# Patient Record
Sex: Female | Born: 1945 | Race: White | Hispanic: No | Marital: Married | Smoking: Former smoker
Health system: Southern US, Community
[De-identification: ages and names within clinical notes are randomized; demographics above are authoritative.]

## PROBLEM LIST (undated history)

## (undated) DIAGNOSIS — C50412 Malignant neoplasm of upper-outer quadrant of left female breast: Secondary | ICD-10-CM

## (undated) DIAGNOSIS — M349 Systemic sclerosis, unspecified: Secondary | ICD-10-CM

## (undated) DIAGNOSIS — Z87891 Personal history of nicotine dependence: Principal | ICD-10-CM

## (undated) DIAGNOSIS — I219 Acute myocardial infarction, unspecified: Secondary | ICD-10-CM

## (undated) DIAGNOSIS — M8580 Other specified disorders of bone density and structure, unspecified site: Secondary | ICD-10-CM

## (undated) DIAGNOSIS — I319 Disease of pericardium, unspecified: Secondary | ICD-10-CM

## (undated) DIAGNOSIS — N390 Urinary tract infection, site not specified: Secondary | ICD-10-CM

## (undated) DIAGNOSIS — F419 Anxiety disorder, unspecified: Secondary | ICD-10-CM

## (undated) DIAGNOSIS — Z8489 Family history of other specified conditions: Secondary | ICD-10-CM

## (undated) DIAGNOSIS — R05 Cough: Secondary | ICD-10-CM

## (undated) DIAGNOSIS — J449 Chronic obstructive pulmonary disease, unspecified: Secondary | ICD-10-CM

## (undated) DIAGNOSIS — Z972 Presence of dental prosthetic device (complete) (partial): Secondary | ICD-10-CM

## (undated) DIAGNOSIS — R0600 Dyspnea, unspecified: Secondary | ICD-10-CM

## (undated) DIAGNOSIS — I10 Essential (primary) hypertension: Secondary | ICD-10-CM

## (undated) DIAGNOSIS — D649 Anemia, unspecified: Secondary | ICD-10-CM

## (undated) DIAGNOSIS — K219 Gastro-esophageal reflux disease without esophagitis: Secondary | ICD-10-CM

## (undated) DIAGNOSIS — R059 Cough, unspecified: Secondary | ICD-10-CM

## (undated) DIAGNOSIS — N189 Chronic kidney disease, unspecified: Secondary | ICD-10-CM

## (undated) DIAGNOSIS — C801 Malignant (primary) neoplasm, unspecified: Secondary | ICD-10-CM

## (undated) HISTORY — PX: CATARACT EXTRACTION W/ INTRAOCULAR LENS IMPLANT: SHX1309

## (undated) HISTORY — DX: Anxiety disorder, unspecified: F41.9

## (undated) HISTORY — PX: CORONARY ANGIOPLASTY: SHX604

## (undated) HISTORY — DX: Anemia, unspecified: D64.9

## (undated) HISTORY — DX: Malignant neoplasm of upper-outer quadrant of left female breast: C50.412

## (undated) HISTORY — PX: VESICOVAGINAL FISTULA CLOSURE W/ TAH: SUR271

## (undated) HISTORY — PX: TUBAL LIGATION: SHX77

## (undated) HISTORY — PX: CARDIAC CATHETERIZATION: SHX172

## (undated) HISTORY — DX: Personal history of nicotine dependence: Z87.891

## (undated) HISTORY — DX: Disease of pericardium, unspecified: I31.9

## (undated) HISTORY — DX: Malignant (primary) neoplasm, unspecified: C80.1

## (undated) HISTORY — DX: Urinary tract infection, site not specified: N39.0

## (undated) HISTORY — PX: EYE SURGERY: SHX253

## (undated) HISTORY — DX: Other specified disorders of bone density and structure, unspecified site: M85.80

---

## 2001-04-29 ENCOUNTER — Other Ambulatory Visit: Admission: RE | Admit: 2001-04-29 | Discharge: 2001-04-29 | Payer: Self-pay | Admitting: Family Medicine

## 2005-11-19 ENCOUNTER — Ambulatory Visit: Payer: Self-pay | Admitting: Gastroenterology

## 2005-12-02 ENCOUNTER — Ambulatory Visit: Payer: Self-pay | Admitting: Gastroenterology

## 2005-12-02 LAB — HM COLONOSCOPY

## 2005-12-16 ENCOUNTER — Ambulatory Visit: Payer: Self-pay

## 2005-12-30 ENCOUNTER — Ambulatory Visit: Payer: Self-pay

## 2006-12-28 ENCOUNTER — Ambulatory Visit: Payer: Self-pay

## 2008-01-25 ENCOUNTER — Ambulatory Visit: Payer: Self-pay | Admitting: Family Medicine

## 2008-02-08 ENCOUNTER — Ambulatory Visit: Payer: Self-pay | Admitting: Family Medicine

## 2008-08-20 ENCOUNTER — Encounter: Payer: Self-pay | Admitting: Internal Medicine

## 2008-09-12 ENCOUNTER — Encounter: Payer: Self-pay | Admitting: Internal Medicine

## 2008-09-27 ENCOUNTER — Other Ambulatory Visit: Payer: Self-pay

## 2008-09-27 ENCOUNTER — Encounter: Payer: Self-pay | Admitting: Internal Medicine

## 2008-09-28 ENCOUNTER — Encounter: Payer: Self-pay | Admitting: Cardiovascular Disease

## 2008-09-28 ENCOUNTER — Encounter: Payer: Self-pay | Admitting: Internal Medicine

## 2008-09-28 ENCOUNTER — Telehealth: Payer: Self-pay | Admitting: Gastroenterology

## 2008-09-28 ENCOUNTER — Ambulatory Visit: Payer: Self-pay | Admitting: Family Medicine

## 2008-10-01 ENCOUNTER — Encounter: Payer: Self-pay | Admitting: Internal Medicine

## 2008-10-02 ENCOUNTER — Telehealth: Payer: Self-pay | Admitting: Gastroenterology

## 2008-10-03 ENCOUNTER — Ambulatory Visit: Payer: Self-pay | Admitting: Gastroenterology

## 2008-10-03 ENCOUNTER — Encounter (INDEPENDENT_AMBULATORY_CARE_PROVIDER_SITE_OTHER): Payer: Self-pay | Admitting: *Deleted

## 2008-10-03 DIAGNOSIS — D649 Anemia, unspecified: Secondary | ICD-10-CM | POA: Insufficient documentation

## 2008-10-04 ENCOUNTER — Encounter: Payer: Self-pay | Admitting: Internal Medicine

## 2008-10-05 ENCOUNTER — Encounter: Payer: Self-pay | Admitting: Internal Medicine

## 2008-10-09 ENCOUNTER — Encounter: Payer: Self-pay | Admitting: Internal Medicine

## 2008-10-10 ENCOUNTER — Telehealth: Payer: Self-pay | Admitting: Gastroenterology

## 2008-10-11 ENCOUNTER — Encounter: Payer: Self-pay | Admitting: Internal Medicine

## 2008-10-15 ENCOUNTER — Encounter: Payer: Self-pay | Admitting: Internal Medicine

## 2008-10-16 ENCOUNTER — Ambulatory Visit: Payer: Self-pay | Admitting: Gastroenterology

## 2008-10-16 ENCOUNTER — Telehealth: Payer: Self-pay | Admitting: Gastroenterology

## 2008-10-16 LAB — CONVERTED CEMR LAB
Fecal Occult Blood: NEGATIVE
OCCULT 3: NEGATIVE
OCCULT 4: NEGATIVE
OCCULT 5: NEGATIVE

## 2008-10-17 LAB — CONVERTED CEMR LAB
OCCULT 1: NEGATIVE
OCCULT 2: NEGATIVE

## 2008-10-18 ENCOUNTER — Encounter: Payer: Self-pay | Admitting: Internal Medicine

## 2008-10-24 ENCOUNTER — Encounter: Payer: Self-pay | Admitting: Cardiovascular Disease

## 2008-10-24 ENCOUNTER — Encounter: Payer: Self-pay | Admitting: Internal Medicine

## 2008-10-24 LAB — CONVERTED CEMR LAB
ALT: 31 units/L
AST: 25 units/L
Creatinine, Ser: 0.72 mg/dL
Potassium: 4.6 meq/L
Sodium: 135 meq/L

## 2008-11-14 ENCOUNTER — Telehealth (INDEPENDENT_AMBULATORY_CARE_PROVIDER_SITE_OTHER): Payer: Self-pay | Admitting: *Deleted

## 2008-11-14 ENCOUNTER — Ambulatory Visit: Payer: Self-pay | Admitting: Family Medicine

## 2008-11-14 ENCOUNTER — Encounter: Payer: Self-pay | Admitting: Internal Medicine

## 2008-11-14 ENCOUNTER — Encounter: Payer: Self-pay | Admitting: Cardiovascular Disease

## 2008-11-19 ENCOUNTER — Encounter: Payer: Self-pay | Admitting: Internal Medicine

## 2008-11-21 ENCOUNTER — Ambulatory Visit: Payer: Self-pay | Admitting: Internal Medicine

## 2008-11-21 DIAGNOSIS — I300 Acute nonspecific idiopathic pericarditis: Secondary | ICD-10-CM | POA: Insufficient documentation

## 2008-12-18 ENCOUNTER — Encounter: Payer: Self-pay | Admitting: Internal Medicine

## 2008-12-26 ENCOUNTER — Ambulatory Visit: Payer: Self-pay | Admitting: Internal Medicine

## 2009-01-11 ENCOUNTER — Ambulatory Visit: Payer: Self-pay | Admitting: Internal Medicine

## 2009-01-11 ENCOUNTER — Ambulatory Visit: Payer: Self-pay

## 2009-07-23 ENCOUNTER — Ambulatory Visit: Payer: Self-pay | Admitting: Nephrology

## 2010-04-27 HISTORY — PX: BREAST BIOPSY: SHX20

## 2010-04-30 ENCOUNTER — Ambulatory Visit: Payer: Self-pay | Admitting: Family Medicine

## 2010-05-05 ENCOUNTER — Ambulatory Visit: Payer: Self-pay | Admitting: Family Medicine

## 2010-05-25 LAB — CONVERTED CEMR LAB
AST: 28 units/L (ref 0–37)
Albumin: 4.3 g/dL (ref 3.5–5.2)
Alkaline Phosphatase: 100 units/L (ref 39–117)
BUN: 12 mg/dL
BUN: 12 mg/dL (ref 6–23)
Basophils Absolute: 0.1 10*3/uL (ref 0.0–0.1)
Eosinophils Relative: 4 % (ref 0–5)
HCT: 34.8 % — ABNORMAL LOW (ref 36.0–46.0)
Lymphocytes Relative: 21 % (ref 12–46)
Neutro Abs: 3.6 10*3/uL (ref 1.7–7.7)
Platelets: 315 10*3/uL (ref 150–400)
Potassium: 4.8 meq/L (ref 3.5–5.3)
RDW: 15.7 % — ABNORMAL HIGH (ref 11.5–15.5)

## 2010-06-02 ENCOUNTER — Ambulatory Visit: Payer: Self-pay | Admitting: General Surgery

## 2010-06-04 LAB — PATHOLOGY REPORT

## 2010-11-07 ENCOUNTER — Encounter: Payer: Self-pay | Admitting: Internal Medicine

## 2010-12-04 ENCOUNTER — Ambulatory Visit: Payer: Self-pay | Admitting: General Surgery

## 2011-06-02 ENCOUNTER — Ambulatory Visit: Payer: Self-pay | Admitting: Family Medicine

## 2012-06-14 ENCOUNTER — Ambulatory Visit: Payer: Self-pay | Admitting: Family Medicine

## 2012-06-20 ENCOUNTER — Ambulatory Visit: Payer: Self-pay | Admitting: Family Medicine

## 2012-07-08 ENCOUNTER — Ambulatory Visit: Payer: Self-pay | Admitting: Family Medicine

## 2012-07-25 ENCOUNTER — Ambulatory Visit: Payer: Self-pay | Admitting: Family Medicine

## 2012-07-28 ENCOUNTER — Ambulatory Visit: Payer: Self-pay | Admitting: Internal Medicine

## 2012-07-28 ENCOUNTER — Ambulatory Visit: Payer: Self-pay | Admitting: Oncology

## 2012-07-29 LAB — CBC CANCER CENTER
Basophil %: 1.3 %
HCT: 32.6 % — ABNORMAL LOW (ref 35.0–47.0)
Lymphocyte #: 1 x10 3/mm (ref 1.0–3.6)
Lymphocyte %: 14.3 %
MCHC: 34.6 g/dL (ref 32.0–36.0)
Monocyte #: 1.1 x10 3/mm — ABNORMAL HIGH (ref 0.2–0.9)
Neutrophil %: 65.2 %
RBC: 3.35 10*6/uL — ABNORMAL LOW (ref 3.80–5.20)
RDW: 13.1 % (ref 11.5–14.5)
WBC: 6.7 x10 3/mm (ref 3.6–11.0)

## 2012-07-29 LAB — COMPREHENSIVE METABOLIC PANEL
Albumin: 3.4 g/dL (ref 3.4–5.0)
Anion Gap: 7 (ref 7–16)
Chloride: 86 mmol/L — ABNORMAL LOW (ref 98–107)
Co2: 28 mmol/L (ref 21–32)
Creatinine: 0.89 mg/dL (ref 0.60–1.30)
Osmolality: 242 (ref 275–301)
Potassium: 4.7 mmol/L (ref 3.5–5.1)
SGPT (ALT): 21 U/L (ref 12–78)
Total Protein: 7.9 g/dL (ref 6.4–8.2)

## 2012-07-29 LAB — PROTIME-INR: INR: 0.9

## 2012-07-29 LAB — APTT: Activated PTT: 30 secs (ref 23.6–35.9)

## 2012-08-04 ENCOUNTER — Ambulatory Visit: Payer: Self-pay | Admitting: Oncology

## 2012-08-10 ENCOUNTER — Ambulatory Visit: Payer: Self-pay | Admitting: Oncology

## 2012-08-16 LAB — CEA: CEA: 5.9 ng/mL — ABNORMAL HIGH (ref 0.0–4.7)

## 2012-08-25 ENCOUNTER — Ambulatory Visit: Payer: Self-pay | Admitting: Oncology

## 2012-08-25 ENCOUNTER — Ambulatory Visit: Payer: Self-pay | Admitting: Internal Medicine

## 2012-09-25 ENCOUNTER — Ambulatory Visit: Payer: Self-pay | Admitting: Oncology

## 2012-09-25 ENCOUNTER — Ambulatory Visit: Payer: Self-pay | Admitting: Internal Medicine

## 2012-10-25 ENCOUNTER — Ambulatory Visit: Payer: Self-pay | Admitting: Oncology

## 2012-10-25 ENCOUNTER — Ambulatory Visit: Payer: Self-pay | Admitting: Internal Medicine

## 2012-12-20 ENCOUNTER — Ambulatory Visit: Payer: Self-pay | Admitting: Family Medicine

## 2013-01-03 ENCOUNTER — Ambulatory Visit: Payer: Self-pay | Admitting: Oncology

## 2013-01-05 ENCOUNTER — Ambulatory Visit: Payer: Self-pay | Admitting: Oncology

## 2013-01-25 ENCOUNTER — Ambulatory Visit: Payer: Self-pay | Admitting: Oncology

## 2013-05-04 ENCOUNTER — Ambulatory Visit: Payer: Self-pay | Admitting: Family Medicine

## 2013-07-04 ENCOUNTER — Ambulatory Visit: Payer: Self-pay | Admitting: Oncology

## 2013-07-10 ENCOUNTER — Ambulatory Visit: Payer: Self-pay | Admitting: Oncology

## 2013-07-11 LAB — CBC CANCER CENTER
Basophil #: 0.1 x10 3/mm (ref 0.0–0.1)
Basophil %: 1.1 %
EOS ABS: 0.1 x10 3/mm (ref 0.0–0.7)
Eosinophil %: 2.7 %
HCT: 33.5 % — ABNORMAL LOW (ref 35.0–47.0)
HGB: 11.3 g/dL — AB (ref 12.0–16.0)
Lymphocyte #: 0.9 x10 3/mm — ABNORMAL LOW (ref 1.0–3.6)
Lymphocyte %: 18.7 %
MCH: 34.2 pg — ABNORMAL HIGH (ref 26.0–34.0)
MCHC: 33.7 g/dL (ref 32.0–36.0)
MCV: 102 fL — AB (ref 80–100)
Monocyte #: 0.8 x10 3/mm (ref 0.2–0.9)
Monocyte %: 16.3 %
NEUTROS ABS: 2.9 x10 3/mm (ref 1.4–6.5)
Neutrophil %: 61.2 %
Platelet: 282 x10 3/mm (ref 150–440)
RBC: 3.29 10*6/uL — ABNORMAL LOW (ref 3.80–5.20)
RDW: 12.9 % (ref 11.5–14.5)
WBC: 4.8 x10 3/mm (ref 3.6–11.0)

## 2013-07-11 LAB — COMPREHENSIVE METABOLIC PANEL
ALK PHOS: 72 U/L
Albumin: 3.8 g/dL (ref 3.4–5.0)
Anion Gap: 10 (ref 7–16)
BUN: 15 mg/dL (ref 7–18)
Bilirubin,Total: 0.3 mg/dL (ref 0.2–1.0)
CHLORIDE: 90 mmol/L — AB (ref 98–107)
Calcium, Total: 9.2 mg/dL (ref 8.5–10.1)
Co2: 25 mmol/L (ref 21–32)
Creatinine: 1.06 mg/dL (ref 0.60–1.30)
EGFR (African American): >60
GFR CALC NON AF AMER: 54 — AB
Glucose: 115 mg/dL — ABNORMAL HIGH (ref 65–99)
Osmolality: 253 (ref 275–301)
Potassium: 4.5 mmol/L (ref 3.5–5.1)
SGOT(AST): 23 U/L (ref 15–37)
SGPT (ALT): 22 U/L (ref 12–78)
SODIUM: 125 mmol/L — AB (ref 136–145)
Total Protein: 8.2 g/dL (ref 6.4–8.2)

## 2013-07-13 LAB — CEA: CEA: 5.7 ng/mL — AB (ref 0.0–4.7)

## 2013-07-26 ENCOUNTER — Ambulatory Visit: Payer: Self-pay | Admitting: Oncology

## 2014-01-16 LAB — LIPID PANEL
CHOLESTEROL: 227 mg/dL — AB (ref 0–200)
HDL: 83 mg/dL — AB (ref 35–70)
LDL Cholesterol: 128 mg/dL
LDl/HDL Ratio: 1.5
Triglycerides: 81 mg/dL (ref 40–160)

## 2014-01-16 LAB — CBC AND DIFFERENTIAL
HEMATOCRIT: 34 % — AB (ref 36–46)
HEMOGLOBIN: 11.5 g/dL — AB (ref 12.0–16.0)
NEUTROS ABS: 3 /uL
Platelets: 296 10*3/uL (ref 150–399)
WBC: 5.8 10^3/mL

## 2014-01-16 LAB — HEPATIC FUNCTION PANEL
ALK PHOS: 64 U/L (ref 25–125)
ALT: 17 U/L (ref 7–35)
AST: 25 U/L (ref 13–35)
Bilirubin, Total: 0.3 mg/dL

## 2014-01-16 LAB — BASIC METABOLIC PANEL
BUN: 11 mg/dL (ref 4–21)
Creatinine: 1 mg/dL (ref 0.5–1.1)
GLUCOSE: 90 mg/dL
Potassium: 5 mmol/L (ref 3.4–5.3)
Sodium: 126 mmol/L — AB (ref 137–147)

## 2014-06-18 ENCOUNTER — Ambulatory Visit: Payer: Self-pay | Admitting: Family Medicine

## 2014-09-26 ENCOUNTER — Other Ambulatory Visit: Payer: Self-pay | Admitting: Family Medicine

## 2014-09-26 DIAGNOSIS — Z13820 Encounter for screening for osteoporosis: Secondary | ICD-10-CM

## 2014-09-28 ENCOUNTER — Telehealth: Payer: Self-pay

## 2014-09-28 NOTE — Telephone Encounter (Signed)
Pap results were normal. [id number]

## 2014-10-01 NOTE — Telephone Encounter (Signed)
10/01/14 Patient advised  ED

## 2014-10-04 ENCOUNTER — Ambulatory Visit: Payer: Self-pay | Attending: Family Medicine

## 2014-12-25 ENCOUNTER — Other Ambulatory Visit: Payer: Self-pay

## 2014-12-25 MED ORDER — BUSPIRONE HCL 5 MG PO TABS: 5.0000 mg | ORAL_TABLET | Freq: Two times a day (BID) | ORAL | Status: DC

## 2015-04-28 HISTORY — PX: MASTECTOMY: SHX3

## 2015-05-23 DIAGNOSIS — N6019 Diffuse cystic mastopathy of unspecified breast: Secondary | ICD-10-CM | POA: Insufficient documentation

## 2015-05-23 DIAGNOSIS — M810 Age-related osteoporosis without current pathological fracture: Secondary | ICD-10-CM

## 2015-05-23 DIAGNOSIS — E7800 Pure hypercholesterolemia, unspecified: Secondary | ICD-10-CM | POA: Insufficient documentation

## 2015-05-23 DIAGNOSIS — M8580 Other specified disorders of bone density and structure, unspecified site: Secondary | ICD-10-CM | POA: Insufficient documentation

## 2015-05-23 DIAGNOSIS — I10 Essential (primary) hypertension: Secondary | ICD-10-CM | POA: Insufficient documentation

## 2015-05-23 DIAGNOSIS — E871 Hypo-osmolality and hyponatremia: Secondary | ICD-10-CM | POA: Insufficient documentation

## 2015-05-23 DIAGNOSIS — J449 Chronic obstructive pulmonary disease, unspecified: Secondary | ICD-10-CM | POA: Insufficient documentation

## 2015-05-23 DIAGNOSIS — J302 Other seasonal allergic rhinitis: Secondary | ICD-10-CM | POA: Insufficient documentation

## 2015-05-23 DIAGNOSIS — K219 Gastro-esophageal reflux disease without esophagitis: Secondary | ICD-10-CM | POA: Insufficient documentation

## 2015-05-23 DIAGNOSIS — F419 Anxiety disorder, unspecified: Secondary | ICD-10-CM | POA: Insufficient documentation

## 2015-06-06 ENCOUNTER — Ambulatory Visit (INDEPENDENT_AMBULATORY_CARE_PROVIDER_SITE_OTHER): Payer: PPO | Admitting: Physician Assistant

## 2015-06-06 VITALS — BP 150/78 | HR 72 | Temp 98.1°F | Resp 16 | Wt 146.0 lb

## 2015-06-06 DIAGNOSIS — R52 Pain, unspecified: Secondary | ICD-10-CM

## 2015-06-06 DIAGNOSIS — J0140 Acute pansinusitis, unspecified: Secondary | ICD-10-CM

## 2015-06-06 DIAGNOSIS — H66001 Acute suppurative otitis media without spontaneous rupture of ear drum, right ear: Secondary | ICD-10-CM | POA: Diagnosis not present

## 2015-06-06 DIAGNOSIS — J449 Chronic obstructive pulmonary disease, unspecified: Secondary | ICD-10-CM | POA: Diagnosis not present

## 2015-06-06 DIAGNOSIS — J069 Acute upper respiratory infection, unspecified: Secondary | ICD-10-CM

## 2015-06-06 DIAGNOSIS — R05 Cough: Secondary | ICD-10-CM

## 2015-06-06 DIAGNOSIS — R059 Cough, unspecified: Secondary | ICD-10-CM

## 2015-06-06 LAB — POCT INFLUENZA A/B
Influenza A, POC: NEGATIVE
Influenza B, POC: NEGATIVE

## 2015-06-06 MED ORDER — ALBUTEROL SULFATE (2.5 MG/3ML) 0.083% IN NEBU: 2.5000 mg | INHALATION_SOLUTION | Freq: Four times a day (QID) | RESPIRATORY_TRACT | Status: AC | PRN

## 2015-06-06 MED ORDER — AMOXICILLIN-POT CLAVULANATE 875-125 MG PO TABS: 1.0000 | ORAL_TABLET | Freq: Two times a day (BID) | ORAL | Status: DC

## 2015-06-06 NOTE — Patient Instructions (Signed)
Otitis Media With Effusion Otitis media with effusion is the presence of fluid in the middle ear. This is a common problem in children, which often follows ear infections. It may be present for weeks or longer after the infection. Unlike an acute ear infection, otitis media with effusion refers only to fluid behind the ear drum and not infection. Children with repeated ear and sinus infections and allergy problems are the most likely to get otitis media with effusion. CAUSES  The most frequent cause of the fluid buildup is dysfunction of the eustachian tubes. These are the tubes that drain fluid in the ears to the back of the nose (nasopharynx). SYMPTOMS   The main symptom of this condition is hearing loss. As a result, you or your child may:  Listen to the TV at a loud volume.  Not respond to questions.  Ask "what" often when spoken to.  Mistake or confuse one sound or word for another.  There may be a sensation of fullness or pressure but usually not pain. DIAGNOSIS   Your health care provider will diagnose this condition by examining you or your child's ears.  Your health care provider may test the pressure in you or your child's ear with a tympanometer.  A hearing test may be conducted if the problem persists. TREATMENT   Treatment depends on the duration and the effects of the effusion.  Antibiotics, decongestants, nose drops, and cortisone-type drugs (tablets or nasal spray) may not be helpful.  Children with persistent ear effusions may have delayed language or behavioral problems. Children at risk for developmental delays in hearing, learning, and speech may require referral to a specialist earlier than children not at risk.  You or your child's health care provider may suggest a referral to an ear, nose, and throat surgeon for treatment. The following may help restore normal hearing:  Drainage of fluid.  Placement of ear tubes (tympanostomy tubes).  Removal of adenoids  (adenoidectomy). HOME CARE INSTRUCTIONS   Avoid secondhand smoke.  Infants who are breastfed are less likely to have this condition.  Avoid feeding infants while they are lying flat.  Avoid known environmental allergens.  Avoid people who are sick. SEEK MEDICAL CARE IF:   Hearing is not better in 3 months.  Hearing is worse.  Ear pain.  Drainage from the ear.  Dizziness. MAKE SURE YOU:   Understand these instructions.  Will watch your condition.  Will get help right away if you are not doing well or get worse.   This information is not intended to replace advice given to you by your health care provider. Make sure you discuss any questions you have with your health care provider.   Document Released: 05/21/2004 Document Revised: 05/04/2014 Document Reviewed: 11/08/2012 Elsevier Interactive Patient Education 2016 Else[employer] Sinusitis, Adult Sinusitis is redness, soreness, and inflammation of the paranasal sinuses. Paranasal sinuses are air pockets within the bones of your face. They are located beneath your eyes, in the middle of your forehead, and above your eyes. In healthy paranasal sinuses, mucus is able to drain out, and air is able to circulate through them by way of your nose. However, when your paranasal sinuses are inflamed, mucus and air can become trapped. This can allow bacteria and other germs to grow and cause infection. Sinusitis can develop quickly and last only a short time (acute) or continue over a long period (chronic). Sinusitis that lasts for more than 12 weeks is considered chronic. CAUSES Causes of sinusitis  include:  Allergies.  Structural abnormalities, such as displacement of the cartilage that separates your nostrils (deviated septum), which can decrease the air flow through your nose and sinuses and affect sinus drainage.  Functional abnormalities, such as when the small hairs (cilia) that line your sinuses and help remove mucus do not work  properly or are not present. SIGNS AND SYMPTOMS Symptoms of acute and chronic sinusitis are the same. The primary symptoms are pain and pressure around the affected sinuses. Other symptoms include:  Upper toothache.  Earache.  Headache.  Bad breath.  Decreased sense of smell and taste.  A cough, which worsens when you are lying flat.  Fatigue.  Fever.  Thick drainage from your nose, which often is green and may contain pus (purulent).  Swelling and warmth over the affected sinuses. DIAGNOSIS Your health care provider will perform a physical exam. During your exam, your health care provider may perform any of the following to help determine if you have acute sinusitis or chronic sinusitis:  Look in your nose for signs of abnormal growths in your nostrils (nasal polyps).  Tap over the affected sinus to check for signs of infection.  View the inside of your sinuses using an imaging device that has a light attached (endoscope). If your health care provider suspects that you have chronic sinusitis, one or more of the following tests may be recommended:  Allergy tests.  Nasal culture. A sample of mucus is taken from your nose, sent to a lab, and screened for bacteria.  Nasal cytology. A sample of mucus is taken from your nose and examined by your health care provider to determine if your sinusitis is related to an allergy. TREATMENT Most cases of acute sinusitis are related to a viral infection and will resolve on their own within 10 days. Sometimes, medicines are prescribed to help relieve symptoms of both acute and chronic sinusitis. These may include pain medicines, decongestants, nasal steroid sprays, or saline sprays. However, for sinusitis related to a bacterial infection, your health care provider will prescribe antibiotic medicines. These are medicines that will help kill the bacteria causing the infection. Rarely, sinusitis is caused by a fungal infection. In these cases,  your health care provider will prescribe antifungal medicine. For some cases of chronic sinusitis, surgery is needed. Generally, these are cases in which sinusitis recurs more than 3 times per year, despite other treatments. HOME CARE INSTRUCTIONS  Drink plenty of water. Water helps thin the mucus so your sinuses can drain more easily.  Use a humidifier.  Inhale steam 3-4 times a day (for example, sit in the bathroom with the shower running).  Apply a warm, moist washcloth to your face 3-4 times a day, or as directed by your health care provider.  Use saline nasal sprays to help moisten and clean your sinuses.  Take medicines only as directed by your health care provider.  If you were prescribed either an antibiotic or antifungal medicine, finish it all even if you start to feel better. SEEK IMMEDIATE MEDICAL CARE IF:  You have increasing pain or severe headaches.  You have nausea, vomiting, or drowsiness.  You have swelling around your face.  You have vision problems.  You have a stiff neck.  You have difficulty breathing.   This information is not intended to replace advice given to you by your health care provider. Make sure you discuss any questions you have with your health care provider.   Document Released: 04/13/2005 Document Revised: 05/04/2014  Document Reviewed: 04/28/2011 Elsevier Interactive Patient Education [employer].

## 2015-06-06 NOTE — Progress Notes (Signed)
Patient ID: Bianca Shaw, female   DOB: [birth year], 70 y.o.   MRN: [id number]   Bianca Shaw  MRN: [id number] DOB: 1946-01-22  Subjective:  HPI  1. Upper respiratory infection Patient is a 70 year old female who presents for evaluation of her upper respiratory infection symptoms.  She states she began with cough 5 days ago and quickly progressed to aches, sore throat, head and chest congestion, and fever of 100 degrees.  She is currently taking Mucinex D for her symptoms with minimal relief.  She does have chronic airflow limitation.  Patient Active Problem List   Diagnosis Date Noted  . Anxiety 05/23/2015  . CAFL (chronic airflow limitation) ([place]) 05/23/2015  . Bloodgood disease 05/23/2015  . Essential (primary) hypertension 05/23/2015  . Acid reflux 05/23/2015  . Hypercholesteremia 05/23/2015  . Below normal amount of sodium in the blood 05/23/2015  . Osteopenia 05/23/2015  . Allergic rhinitis, seasonal 05/23/2015  . HYPERTENSION, BENIGN 12/26/2008  . ACUTE IDIOPATHIC PERICARDITIS 11/21/2008  . UNSPECIFIED ANEMIA 10/03/2008    Past Medical History  Diagnosis Date  . Anemia   . Osteopenia   . Anxiety disorder   . Pericarditis     diagnonsed June, 2010, unclear etiology as of yer  . UTI (lower urinary tract infection)   . H/O: hysterectomy   . Osteoporosis     Social History   Social History  . Marital Status: Married    Spouse Name: N/A  . Number of Children: 2  . Years of Education: N/A   Occupational History  . Not on file.   Social History Main Topics  . Smoking status: Former Smoker    Quit date: 11/07/2003  . Smokeless tobacco: Not on file  . Alcohol Use: Yes     Comment: 1-3 a day  . Drug Use: Not on file  . Sexual Activity: Not on file   Other Topics Concern  . Not on file   Social History Narrative   She drinks 1 to 2 caffeinated beverages a day    Outpatient Prescriptions Prior to Visit  Medication Sig Dispense Refill  .  ALPRAZolam (XANAX) 0.5 MG tablet Take by mouth.    Marland Kitchen amLODipine-olmesartan (AZOR) 10-40 MG tablet Take by mouth.    . Ascorbic Acid (VITAMIN C) 100 MG tablet Take 100 mg by mouth daily.      Marland Kitchen aspirin 81 MG tablet Take by mouth.    . busPIRone (BUSPAR) 5 MG tablet Take 1 tablet (5 mg total) by mouth 2 (two) times daily. 60 tablet 12  . Calcium-Magnesium-Vitamin D (CALCIUM MAGNESIUM PO) Take by mouth.    . fexofenadine (ALLEGRA) 180 MG tablet Take by mouth.    . fish oil-omega-3 fatty acids 1000 MG capsule Take 2 g by mouth daily.      . fluticasone (FLONASE) 50 MCG/ACT nasal spray Place into the nose.    . Glucosamine-Chondroit-Vit C-Mn (GLUCOSAMINE CHONDR 1500 COMPLX PO) Take 1 capsule by mouth daily.      Marland Kitchen ibuprofen (ADVIL,MOTRIN) 200 MG tablet Take 200 mg by mouth every 6 (six) hours as needed.      . loratadine (CLARITIN) 10 MG tablet Take by mouth.    . Magnesium Oxide -Mg Supplement 250 MG TABS Take 1 tablet by mouth daily.      . metoprolol succinate (TOPROL-XL) 25 MG 24 hr tablet Take by mouth.    . Multiple Vitamin (MULTIVITAMIN) tablet Take 1 tablet by mouth daily.      Marland Kitchen  omeprazole (PRILOSEC) 20 MG capsule Take 20 mg by mouth daily.      . Psyllium-Calcium (METAMUCIL PLUS CALCIUM PO) Take 1 tablet by mouth daily.      . calcium gluconate 500 MG tablet Take 500 mg by mouth daily.      . calcium-vitamin D (OSCAL) 250-125 MG-UNIT per tablet Take 1 tablet by mouth daily.      . Chondroitin Sulfate 150 MG CAPS Take 1 capsule by mouth daily.      . colchicine 0.6 MG tablet Take 0.6 mg by mouth daily.      Marland Kitchen FLAXSEED, LINSEED, PO Take by mouth.    Marland Kitchen GARLIC 99991111 PO Take by mouth.    . Garlic Oil 123XX123 MG CAPS Take 1 capsule by mouth daily.      . Magnesium 250 MG TABS Take by mouth.     No facility-administered medications prior to visit.    Allergies  Allergen Reactions  . Diuretic  [Buchu-Cornsilk-Ch Grass-Hydran]     hyponatremia  . Furosemide     hives  . Other      SSRI---hyponatremia    Review of Systems  Constitutional: Positive for fever, chills, malaise/fatigue and diaphoresis.  HENT: Positive for congestion, ear pain, hearing loss, sore throat and tinnitus. Negative for ear discharge and nosebleeds.   Eyes: Positive for photophobia. Negative for blurred vision, double vision, pain, discharge and redness.  Respiratory: Positive for cough, sputum production (green) and wheezing. Negative for hemoptysis and shortness of breath.   Cardiovascular: Negative for chest pain, palpitations, orthopnea and leg swelling.  Gastrointestinal: Negative for nausea, vomiting and abdominal pain.  Neurological: Positive for weakness and headaches. Negative for dizziness.   Objective:  BP 150/78 mmHg  Pulse 72  Temp(Src) 98.1 F (36.7 C) (Oral)  Resp 16  Wt 146 lb (66.225 kg)  SpO2 98%  Physical Exam  Constitutional: She is well-developed, well-nourished, and in no distress. No distress.  HENT:  Head: Normocephalic and atraumatic.  Right Ear: External ear and ear canal normal. Tympanic membrane is erythematous and bulging. Tympanic membrane is not perforated. A middle ear effusion is present.  Left Ear: Tympanic membrane, external ear and ear canal normal.  Nose: Mucosal edema and rhinorrhea present. Right sinus exhibits maxillary sinus tenderness and frontal sinus tenderness. Left sinus exhibits maxillary sinus tenderness and frontal sinus tenderness.  Mouth/Throat: Uvula is midline, oropharynx is clear and moist and mucous membranes are normal. No oropharyngeal exudate, posterior oropharyngeal edema or posterior oropharyngeal erythema.  Neck: Normal range of motion. Neck supple. No tracheal deviation present. No thyromegaly present.  Cardiovascular: Normal rate, regular rhythm and normal heart sounds.  Exam reveals no friction rub.   No murmur heard. Pulmonary/Chest: Effort normal and breath sounds normal. No stridor. No respiratory distress. She has no  wheezes. She has no rales. She exhibits no tenderness.  Lymphadenopathy:    She has no cervical adenopathy.  Skin: She is not diaphoretic.  Vitals reviewed.   Assessment and Plan :  1. Upper respiratory infection Flu test was negative. - POCT Influenza A/B  2. Body aches Flu test was negative. Advised she may take Tylenol as needed for fever and body aches. - POCT Influenza A/B  3. Cough Increased cough with no wheezing or other adventitious lung sounds on exam today. She does have history of chronic airflow limitations. She is sleeping okay at night. Advised to continue Mucinex DM during the day for cough and congestion and to use Delsym at  night for nighttime cough. . I did also send in a refill of her albuterol for her nebulizer.  She is to call the office if the cough worsens or if she develops shortness of breath or chest tightness or wheezing - POCT Influenza A/B  4. Acute pansinusitis, recurrence not specified Worsening sinusitis with otitis media that has not responded to over-the-counter medications. I will give Augmentin as below. She may take Tylenol as needed for fever and body aches. Advised that she may continue Mucinex DM for congestion and cough during the day. She may take Delsym for nighttime cough suppression. She needs to make sure to stay well-hydrated and to try to get plenty of rest. She is to call the office if symptoms fail to improve or worsen. - amoxicillin-clavulanate (AUGMENTIN) 875-125 MG tablet; Take 1 tablet by mouth 2 (two) times daily.  Dispense: 20 tablet; Refill: 0  5. Acute suppurative otitis media of right ear without spontaneous rupture of tympanic membrane, recurrence not specified See above medical treatment plan for #4. - amoxicillin-clavulanate (AUGMENTIN) 875-125 MG tablet; Take 1 tablet by mouth 2 (two) times daily.  Dispense: 20 tablet; Refill: 0  6. Chronic obstructive pulmonary disease, unspecified COPD type ([place]) With current upper  respiratory and sinusitis infection with cough and her history of chronic airflow obstruction I have refilled her albuterol for her nebulizer machine as below. She may use this as needed for shortness of breath and wheezing every 6 hours. She is to call the office if symptoms fail to improve or worsen. - albuterol (PROVENTIL) (2.5 MG/3ML) 0.083% nebulizer solution; Take 3 mLs (2.5 mg total) by nebulization every 6 (six) hours as needed for wheezing or shortness of breath.  Dispense: 75 mL; Refill: Royal, PA-C [facility] 06/06/2015 10:30 AM

## 2015-07-02 ENCOUNTER — Telehealth: Payer: Self-pay | Admitting: Family Medicine

## 2015-07-02 ENCOUNTER — Encounter: Payer: PPO | Admitting: Family Medicine

## 2015-07-02 ENCOUNTER — Encounter: Payer: Self-pay | Admitting: Gastroenterology

## 2015-07-02 MED ORDER — ALPRAZOLAM 0.5 MG PO TABS: ORAL_TABLET | ORAL | Status: DC

## 2015-07-02 MED ORDER — BUSPIRONE HCL 5 MG PO TABS: 5.0000 mg | ORAL_TABLET | Freq: Two times a day (BID) | ORAL | Status: DC

## 2015-07-02 MED ORDER — METOPROLOL SUCCINATE ER 25 MG PO TB24: 25.0000 mg | ORAL_TABLET | Freq: Every day | ORAL | Status: DC

## 2015-07-02 NOTE — Telephone Encounter (Signed)
Yes

## 2015-07-02 NOTE — Telephone Encounter (Signed)
Pt advised and rx refilled-aa

## 2015-07-02 NOTE — Telephone Encounter (Signed)
Pt was scheduled to be seen today for CPE but it was too early and i called and told patient and her husband that we scheduled them wrong so we rescheduled appointments for CPE when she is due. It is ok to refill medication enough till she is seen? Your schedule was blocked off some in May and June so i got her in on June 14th because she has to wait till after may 31 from her last cpe. pelase review-aa

## 2015-07-02 NOTE — Telephone Encounter (Signed)
Pt needs refills  busPIRone (BUSPAR) 5 MG tablet  metoprolol succinate (TOPROL-XL) 25 MG 24 hr tablet  ALPRAZolam (XANAX) 0.5 MG tablet  She uses [facility]

## 2015-07-16 DIAGNOSIS — H2513 Age-related nuclear cataract, bilateral: Secondary | ICD-10-CM | POA: Diagnosis not present

## 2015-07-16 DIAGNOSIS — H35033 Hypertensive retinopathy, bilateral: Secondary | ICD-10-CM | POA: Diagnosis not present

## 2015-07-24 DIAGNOSIS — H2511 Age-related nuclear cataract, right eye: Secondary | ICD-10-CM | POA: Diagnosis not present

## 2015-07-24 DIAGNOSIS — H25012 Cortical age-related cataract, left eye: Secondary | ICD-10-CM | POA: Diagnosis not present

## 2015-07-24 DIAGNOSIS — H25011 Cortical age-related cataract, right eye: Secondary | ICD-10-CM | POA: Diagnosis not present

## 2015-07-24 DIAGNOSIS — H2512 Age-related nuclear cataract, left eye: Secondary | ICD-10-CM | POA: Diagnosis not present

## 2015-07-24 DIAGNOSIS — H35372 Puckering of macula, left eye: Secondary | ICD-10-CM | POA: Diagnosis not present

## 2015-07-24 DIAGNOSIS — H35033 Hypertensive retinopathy, bilateral: Secondary | ICD-10-CM | POA: Diagnosis not present

## 2015-08-09 ENCOUNTER — Other Ambulatory Visit: Payer: Self-pay | Admitting: Family Medicine

## 2015-08-27 DIAGNOSIS — H2511 Age-related nuclear cataract, right eye: Secondary | ICD-10-CM | POA: Diagnosis not present

## 2015-10-03 DIAGNOSIS — H25012 Cortical age-related cataract, left eye: Secondary | ICD-10-CM | POA: Diagnosis not present

## 2015-10-03 DIAGNOSIS — H2512 Age-related nuclear cataract, left eye: Secondary | ICD-10-CM | POA: Diagnosis not present

## 2015-10-08 ENCOUNTER — Ambulatory Visit (INDEPENDENT_AMBULATORY_CARE_PROVIDER_SITE_OTHER): Payer: PPO | Admitting: Family Medicine

## 2015-10-08 VITALS — BP 144/84 | HR 74 | Temp 97.8°F | Resp 16 | Ht 62.0 in | Wt 148.0 lb

## 2015-10-08 DIAGNOSIS — D649 Anemia, unspecified: Secondary | ICD-10-CM

## 2015-10-08 DIAGNOSIS — Z87891 Personal history of nicotine dependence: Secondary | ICD-10-CM | POA: Diagnosis not present

## 2015-10-08 DIAGNOSIS — N951 Menopausal and female climacteric states: Secondary | ICD-10-CM

## 2015-10-08 DIAGNOSIS — J449 Chronic obstructive pulmonary disease, unspecified: Secondary | ICD-10-CM | POA: Diagnosis not present

## 2015-10-08 DIAGNOSIS — Z1211 Encounter for screening for malignant neoplasm of colon: Secondary | ICD-10-CM

## 2015-10-08 DIAGNOSIS — Z720 Tobacco use: Secondary | ICD-10-CM

## 2015-10-08 DIAGNOSIS — I10 Essential (primary) hypertension: Secondary | ICD-10-CM | POA: Diagnosis not present

## 2015-10-08 DIAGNOSIS — E7800 Pure hypercholesterolemia, unspecified: Secondary | ICD-10-CM | POA: Diagnosis not present

## 2015-10-08 DIAGNOSIS — Z1239 Encounter for other screening for malignant neoplasm of breast: Secondary | ICD-10-CM

## 2015-10-08 DIAGNOSIS — Z0000 Encounter for general adult medical examination without abnormal findings: Secondary | ICD-10-CM

## 2015-10-08 NOTE — Progress Notes (Signed)
Patient ID: Bianca MCCAY, female   DOB: 1945-09-25, 70 y.o.   MRN: [id number] Patient: Bianca Shaw, Female    DOB: [birth year], 70 y.o.   MRN: [id number] Visit Date: 10/08/2015  Today's Provider: Wilhemena Durie, MD   Chief Complaint  Patient presents with  . Annual Exam   Subjective:   Bianca Shaw is a 70 y.o. female who presents today for her Subsequent Annual Wellness Visit. She feels well. She reports exercising none. She reports she is sleeping well. Patient feels well. She had cataract surgery on the right a month ago and has the left cataract  for removal next week. She has a 50-pack-year history of smoking and quit a few years ago. She is due for colonoscopy and has not had lab work in about a year.  Immunization History  Administered Date(s) Administered  . Influenza, High Dose Seasonal PF 02/18/2015  . Pneumococcal Conjugate-13 09/25/2014  . Pneumococcal Polysaccharide-23 04/03/2010  . Zoster 04/09/2011   12/02/05 Colonoscopy 06/18/14 Mammogram-ordered, not done 09/26/2014 BMD-ordered, not done 09/25/2014 Pap- negative  Review of Systems  Constitutional: Negative.   HENT: Negative.   Eyes: Negative.   Respiratory: Negative.   Cardiovascular: Positive for leg swelling.  Gastrointestinal: Negative.   Endocrine: Negative.   Genitourinary: Negative.   Musculoskeletal: Negative.   Skin: Negative.   Allergic/Immunologic: Negative.   Neurological: Negative.   Hematological: Negative.   Psychiatric/Behavioral: Negative.     Patient Active Problem List   Diagnosis Date Noted  . Anxiety 05/23/2015  . CAFL (chronic airflow limitation) ([place]) 05/23/2015  . Bloodgood disease 05/23/2015  . Essential (primary) hypertension 05/23/2015  . Acid reflux 05/23/2015  . Hypercholesteremia 05/23/2015  . Below normal amount of sodium in the blood 05/23/2015  . Osteopenia 05/23/2015  . Allergic rhinitis, seasonal 05/23/2015  . HYPERTENSION, BENIGN 12/26/2008  .  ACUTE IDIOPATHIC PERICARDITIS 11/21/2008  . UNSPECIFIED ANEMIA 10/03/2008    Social History   Social History  . Marital Status: Married    Spouse Name: N/A  . Number of Children: 2  . Years of Education: N/A   Occupational History  . Not on file.   Social History Main Topics  . Smoking status: Former Smoker    Quit date: 11/07/2003  . Smokeless tobacco: Not on file  . Alcohol Use: Yes     Comment: 1-3 a day  . Drug Use: Not on file  . Sexual Activity: Not on file   Other Topics Concern  . Not on file   Social History Narrative   She drinks 1 to 2 caffeinated beverages a day    Past Surgical History  Procedure Laterality Date  . Vesicovaginal fistula closure w/ tah    . Breast biopsy    . Tubal ligation      Her family history includes Anxiety disorder in her sister; Arthritis in her brother and sister; COPD in her brother, brother, and father; Epilepsy in her mother; Heart attack in her paternal grandfather; Heart disease in her brother and father; Heart failure in her mother; Kidney failure in her brother; Vaginal cancer in her paternal grandmother. There is no history of Colon cancer or Stomach cancer.    Outpatient Prescriptions Prior to Visit  Medication Sig Dispense Refill  . albuterol (PROVENTIL) (2.5 MG/3ML) 0.083% nebulizer solution Take 3 mLs (2.5 mg total) by nebulization every 6 (six) hours as needed for wheezing or shortness of breath. 75 mL 12  . ALPRAZolam (XANAX) 0.5 MG tablet 1/2 tablet  twice daily as needed 60 tablet 3  . amLODipine-olmesartan (AZOR) 10-40 MG tablet Take by mouth.    . Ascorbic Acid (VITAMIN C) 100 MG tablet Take 100 mg by mouth daily.      Marland Kitchen aspirin 81 MG tablet Take by mouth.    . busPIRone (BUSPAR) 5 MG tablet 1 Tablet, Oral, two times daily 60 tablet 2  . Calcium-Magnesium-Vitamin D (CALCIUM MAGNESIUM PO) Take by mouth.    . fexofenadine (ALLEGRA) 180 MG tablet Take by mouth.    . fish oil-omega-3 fatty acids 1000 MG capsule Take  2 g by mouth daily.      . fluticasone (FLONASE) 50 MCG/ACT nasal spray Place into the nose.    . Glucosamine-Chondroit-Vit C-Mn (GLUCOSAMINE CHONDR 1500 COMPLX PO) Take 1 capsule by mouth daily.      Marland Kitchen ibuprofen (ADVIL,MOTRIN) 200 MG tablet Take 200 mg by mouth every 6 (six) hours as needed.      . loratadine (CLARITIN) 10 MG tablet Take by mouth.    . Magnesium Oxide -Mg Supplement 250 MG TABS Take 1 tablet by mouth daily.      . metoprolol succinate (TOPROL-XL) 25 MG 24 hr tablet Take 1 tablet (25 mg total) by mouth daily. 30 tablet 3  . Multiple Vitamin (MULTIVITAMIN) tablet Take 1 tablet by mouth daily.      Marland Kitchen omeprazole (PRILOSEC) 20 MG capsule Take 20 mg by mouth daily.      . Psyllium-Calcium (METAMUCIL PLUS CALCIUM PO) Take 1 tablet by mouth daily.      Marland Kitchen amoxicillin-clavulanate (AUGMENTIN) 875-125 MG tablet Take 1 tablet by mouth 2 (two) times daily. 20 tablet 0   No facility-administered medications prior to visit.    Allergies  Allergen Reactions  . Diuretic  [Buchu-Cornsilk-Ch Grass-Hydran]     hyponatremia  . Furosemide     hives  . Other     SSRI---hyponatremia    Patient Care Team: Jerrol Banana., MD as PCP - General (Family Medicine)  Objective:   Vitals:  Filed Vitals:   10/08/15 0856  BP: 144/84  Pulse: 74  Temp: 97.8 F (36.6 C)  TempSrc: Oral  Resp: 16  Height: 5\' 2"  (1.575 m)  Weight: 148 lb (67.132 kg)    Physical Exam  Constitutional: She is oriented to person, place, and time. She appears well-developed and well-nourished.  HENT:  Head: Normocephalic and atraumatic.  Right Ear: External ear normal.  Left Ear: External ear normal.  Nose: Nose normal.  Mouth/Throat: Oropharynx is clear and moist.  Eyes: Conjunctivae and EOM are normal. Pupils are equal, round, and reactive to light.  Neck: Normal range of motion. Neck supple.  Cardiovascular: Normal rate, regular rhythm, normal heart sounds and intact distal pulses.    Pulmonary/Chest: Effort normal and breath sounds normal.  Abdominal: Soft. Bowel sounds are normal.  Musculoskeletal: Normal range of motion.  Neurological: She is alert and oriented to person, place, and time. She has normal reflexes.  Skin: Skin is warm and dry.  Psychiatric: Her behavior is normal. Judgment and thought content normal.    Activities of Daily Living In your present state of health, do you have any difficulty performing the following activities: 10/08/2015  Hearing? N  Vision? N  Difficulty concentrating or making decisions? N  Walking or climbing stairs? N  Dressing or bathing? N  Doing errands, shopping? N    Fall Risk Assessment Fall Risk  10/08/2015  Falls in the past year? No  Depression Screen PHQ 2/9 Scores 10/08/2015  PHQ - 2 Score 0    Cognitive Testing - 6-CIT    Year: 0 4 points  Month: 0 3 points  Memorize "Pia Mau, [address] [place]"  Time (within 1 hour:) 0 3 points  Count backwards from 20: 0 2 4 points  Name months of year: 0 2 4 points  Repeat Address: 0 2 4 6 8 10  points   Total Score: 8/28  Interpretation : Normal (0-7) Abnormal (8-28)    Assessment & Plan:     Annual Wellness Visit  Reviewed patient's Family Medical History Reviewed and updated list of patient's medical providers Assessment of cognitive impairment was done Assessed patient's functional ability Established a written schedule for health screening [place] Completed and Reviewed  Exercise Activities and Dietary recommendations Goals    None      Immunization History  Administered Date(s) Administered  . Influenza, High Dose Seasonal PF 02/18/2015  . Pneumococcal Conjugate-13 09/25/2014  . Pneumococcal Polysaccharide-23 04/03/2010  . Zoster 04/09/2011    Health Maintenance  Topic Date Due  . Hepatitis C Screening  29-May-1945  . TETANUS/TDAP  02/18/1964  . DEXA SCAN  02/17/2010  . INFLUENZA VACCINE  11/26/2015  .  COLONOSCOPY  12/03/2015  . MAMMOGRAM  06/18/2016  . ZOSTAVAX  Completed  . PNA vac Low Risk Adult  Completed   1. Medicare annual wellness visit, subsequent   2. HYPERTENSION, BENIGN   3. Essential (primary) hypertension  - CBC with Differential/Platelet - Comprehensive metabolic panel - TSH  4. Chronic obstructive pulmonary disease, unspecified COPD type (HCC)   5. UNSPECIFIED ANEMIA  - CBC with Differential/Platelet  6. Hypercholesteremia  - Lipid Panel With LDL/HDL Ratio  7. Colon cancer screening  - Ambulatory referral to Gastroenterology  8. Breast cancer screening  - MM DIGITAL SCREENING BILATERAL; Future  9. Ex-heavy cigarette smoker (20-39 per day)  - CT CHEST LUNG CA SCREEN LOW DOSE W/O CM; Future  10. Tobacco abuse Now discontinued. - CT CHEST LUNG CA SCREEN LOW DOSE W/O CM; Future  11. Menopausal state  - DG Bone Density; Future    Discussed health benefits of physical activity, and encouraged her to engage in regular exercise appropriate for her age and condition.    Patient was seen and examined by Dr. Delfino Lovett L. [place] Mon and the note was scribed by Althea Charon, RMA.   Miguel Aschoff MD [facility] 10/08/2015 9:04 AM  ------------------------------------------------------------------------------------------------------------

## 2015-10-11 DIAGNOSIS — E7800 Pure hypercholesterolemia, unspecified: Secondary | ICD-10-CM | POA: Diagnosis not present

## 2015-10-11 DIAGNOSIS — I10 Essential (primary) hypertension: Secondary | ICD-10-CM | POA: Diagnosis not present

## 2015-10-11 DIAGNOSIS — D649 Anemia, unspecified: Secondary | ICD-10-CM | POA: Diagnosis not present

## 2015-10-12 LAB — CBC WITH DIFFERENTIAL/PLATELET
Basophils Absolute: 0.1 10*3/uL (ref 0.0–0.2)
Basos: 2 %
EOS (ABSOLUTE): 0.3 10*3/uL (ref 0.0–0.4)
EOS: 7 %
HEMOGLOBIN: 11.5 g/dL (ref 11.1–15.9)
Hematocrit: 34.2 % (ref 34.0–46.6)
Immature Grans (Abs): 0 10*3/uL (ref 0.0–0.1)
Immature Granulocytes: 0 %
LYMPHS ABS: 0.9 10*3/uL (ref 0.7–3.1)
Lymphs: 21 %
MCH: 33.1 pg — AB (ref 26.6–33.0)
MCHC: 33.6 g/dL (ref 31.5–35.7)
MCV: 99 fL — AB (ref 79–97)
MONOS ABS: 0.9 10*3/uL (ref 0.1–0.9)
Monocytes: 22 %
NEUTROS ABS: 2 10*3/uL (ref 1.4–7.0)
Neutrophils: 48 %
Platelets: 270 10*3/uL (ref 150–379)
RBC: 3.47 x10E6/uL — AB (ref 3.77–5.28)
RDW: 13.4 % (ref 12.3–15.4)
WBC: 4.1 10*3/uL (ref 3.4–10.8)

## 2015-10-12 LAB — COMPREHENSIVE METABOLIC PANEL
A/G RATIO: 1.3 (ref 1.2–2.2)
ALBUMIN: 4.5 g/dL (ref 3.5–4.8)
ALK PHOS: 76 IU/L (ref 39–117)
ALT: 14 IU/L (ref 0–32)
AST: 24 IU/L (ref 0–40)
BILIRUBIN TOTAL: 0.4 mg/dL (ref 0.0–1.2)
BUN / CREAT RATIO: 16 (ref 12–28)
BUN: 15 mg/dL (ref 8–27)
CHLORIDE: 84 mmol/L — AB (ref 96–106)
CO2: 18 mmol/L (ref 18–29)
Calcium: 9.5 mg/dL (ref 8.7–10.3)
Creatinine, Ser: 0.96 mg/dL (ref 0.57–1.00)
GFR calc Af Amer: 69 mL/min/{1.73_m2} (ref >59)
GFR calc non Af Amer: 60 mL/min/{1.73_m2} (ref >59)
GLOBULIN, TOTAL: 3.6 g/dL (ref 1.5–4.5)
GLUCOSE: 88 mg/dL (ref 65–99)
Potassium: 4.7 mmol/L (ref 3.5–5.2)
SODIUM: 124 mmol/L — AB (ref 134–144)
Total Protein: 8.1 g/dL (ref 6.0–8.5)

## 2015-10-12 LAB — LIPID PANEL WITH LDL/HDL RATIO
CHOLESTEROL TOTAL: 208 mg/dL — AB (ref 100–199)
HDL: 72 mg/dL (ref >39)
LDL Calculated: 123 mg/dL — ABNORMAL HIGH (ref 0–99)
LDl/HDL Ratio: 1.7 ratio units (ref 0.0–3.2)
TRIGLYCERIDES: 64 mg/dL (ref 0–149)
VLDL Cholesterol Cal: 13 mg/dL (ref 5–40)

## 2015-10-12 LAB — TSH: TSH: 0.954 u[IU]/mL (ref 0.450–4.500)

## 2015-10-14 ENCOUNTER — Encounter: Payer: Self-pay | Admitting: Family Medicine

## 2015-10-14 ENCOUNTER — Ambulatory Visit (INDEPENDENT_AMBULATORY_CARE_PROVIDER_SITE_OTHER): Payer: PPO | Admitting: Family Medicine

## 2015-10-14 VITALS — BP 144/74 | HR 84 | Temp 98.6°F | Resp 20 | Wt 145.0 lb

## 2015-10-14 DIAGNOSIS — J069 Acute upper respiratory infection, unspecified: Secondary | ICD-10-CM

## 2015-10-14 DIAGNOSIS — R112 Nausea with vomiting, unspecified: Secondary | ICD-10-CM

## 2015-10-14 MED ORDER — ONDANSETRON HCL 4 MG PO TABS: 4.0000 mg | ORAL_TABLET | Freq: Three times a day (TID) | ORAL | Status: DC | PRN

## 2015-10-14 NOTE — Patient Instructions (Addendum)
Schedule the nebulizer once or twice daily while ill. Continue Mucinex as an expectorant. Use saline nasal spray. Try Delsym for cough. Call me Wednesday or Thursday about how your are doing. Use Gatorade to help with fluid intake. Eat as tolerated using Zofran as needed.

## 2015-10-14 NOTE — Progress Notes (Signed)
Subjective:     Patient ID: Bianca Shaw, female   DOB: 11/09/1945, 70 y.o.   MRN: [id number]  HPI  Chief Complaint  Patient presents with  . URI    Patient reports that she has had cough, congestion in her chest, felt feverish, chills, nausea, and vomiting for the last 4 days. Patient reports that this came all of a sudden. Patient has been taking Mucinex with no relief.   Reports clear to yellow PND and nausea/vomiting when she tries to eat. Has been drinking water to keep up with her fluids. Reports leg weakness and has used her albuterol HHN on one occasion last night. Accompanied by her husband today.   Review of Systems     Objective:   Physical Exam  Constitutional: She appears well-developed and well-nourished. No distress.  Ears: T.M's intact without inflammation Sinuses: non-tender Throat: no tonsillar enlargement or exudate Neck: no cervical adenopathy Lungs: clear     Assessment:    1. Upper respiratory infection  2. Non-intractable vomiting with nausea, vomiting of unspecified type - ondansetron (ZOFRAN) 4 MG tablet; Take 1 tablet (4 mg total) by mouth every 8 (eight) hours as needed for nausea or vomiting.  Dispense: 12 tablet; Refill: 0    Plan:    Discussed otc products and addition of Gatorade. May use Zofran to start eating again and schedule albuterol while ill.

## 2015-10-15 ENCOUNTER — Telehealth: Payer: Self-pay | Admitting: *Deleted

## 2015-10-15 NOTE — Telephone Encounter (Signed)
Received referral for initial lung cancer screening scan. Contacted patient and obtained smoking history,(former, quit 2005, 30 pack year history) as well as answering questions related to screening process. Patient denies signs of lung cancer such as weight loss or hemoptysis. Patient denies comorbidity that would prevent curative treatment if lung cancer were found. Patient is tentatively scheduled for shared decision making visit and CT scan on 11/01/15 at 2pm, pending insurance approval from business office.

## 2015-10-22 ENCOUNTER — Telehealth: Payer: Self-pay | Admitting: Gastroenterology

## 2015-10-22 NOTE — Telephone Encounter (Signed)
Patient said she was feeling better and ready to schedule her colonoscopy

## 2015-10-23 ENCOUNTER — Other Ambulatory Visit: Payer: Self-pay

## 2015-10-23 NOTE — Telephone Encounter (Signed)
Pt scheduled for colonoscopy at [facility] on 11/08/15. Instructs/rx mailed. Hx of colon polyps Z86.010. Please precert.

## 2015-10-23 NOTE — Telephone Encounter (Signed)
Gastroenterology Pre-Procedure Review  Request Date: 11/08/15 Requesting Physician: Dr. Rosanna Randy  PATIENT REVIEW QUESTIONS: The patient responded to the following health history questions as indicated:    1. Are you having any GI issues? no 2. Do you have a personal history of Polyps? yes (Repeat in 5 ) 3. Do you have a family history of Colon Cancer or Polyps? no 4. Diabetes Mellitus? no 5. Joint replacements in the past 12 months?no 6. Major health problems in the past 3 months?no 7. Any artificial heart valves, MVP, or defibrillator?no    MEDICATIONS & ALLERGIES:    Patient reports the following regarding taking any anticoagulation/antiplatelet therapy:   Plavix, Coumadin, Eliquis, Xarelto, Lovenox, Pradaxa, Brilinta, or Effient? no Aspirin? yes (ASA 81mg )  Patient confirms/reports the following medications:  Current Outpatient Prescriptions  Medication Sig Dispense Refill  . albuterol (PROVENTIL) (2.5 MG/3ML) 0.083% nebulizer solution Take 3 mLs (2.5 mg total) by nebulization every 6 (six) hours as needed for wheezing or shortness of breath. 75 mL 12  . ALPRAZolam (XANAX) 0.5 MG tablet 1/2 tablet twice daily as needed 60 tablet 3  . amLODipine-olmesartan (AZOR) 10-40 MG tablet Take by mouth.    . Ascorbic Acid (VITAMIN C) 100 MG tablet Take 100 mg by mouth daily.      Marland Kitchen aspirin 81 MG tablet Take by mouth.    . busPIRone (BUSPAR) 5 MG tablet 1 Tablet, Oral, two times daily 60 tablet 2  . Calcium-Magnesium-Vitamin D (CALCIUM MAGNESIUM PO) Take by mouth.    . fexofenadine (ALLEGRA) 180 MG tablet Take by mouth. Reported on 10/14/2015    . fish oil-omega-3 fatty acids 1000 MG capsule Take 2 g by mouth daily.      . fluticasone (FLONASE) 50 MCG/ACT nasal spray Place into the nose.    . Glucosamine-Chondroit-Vit C-Mn (GLUCOSAMINE CHONDR 1500 COMPLX PO) Take 1 capsule by mouth daily.      Marland Kitchen ibuprofen (ADVIL,MOTRIN) 200 MG tablet Take 200 mg by mouth every 6 (six) hours as needed.      .  loratadine (CLARITIN) 10 MG tablet Take by mouth. Reported on 10/14/2015    . Magnesium Oxide -Mg Supplement 250 MG TABS Take 1 tablet by mouth daily.      . metoprolol succinate (TOPROL-XL) 25 MG 24 hr tablet Take 1 tablet (25 mg total) by mouth daily. 30 tablet 3  . Multiple Vitamin (MULTIVITAMIN) tablet Take 1 tablet by mouth daily.      Marland Kitchen omeprazole (PRILOSEC) 20 MG capsule Take 20 mg by mouth daily.      . ondansetron (ZOFRAN) 4 MG tablet Take 1 tablet (4 mg total) by mouth every 8 (eight) hours as needed for nausea or vomiting. 12 tablet 0  . Psyllium-Calcium (METAMUCIL PLUS CALCIUM PO) Take 1 tablet by mouth daily.       No current facility-administered medications for this visit.    Patient confirms/reports the following allergies:  Allergies  Allergen Reactions  . Diuretic  [Buchu-Cornsilk-Ch Grass-Hydran]     hyponatremia  . Furosemide     hives  . Other     SSRI---hyponatremia    No orders of the defined types were placed in this encounter.    AUTHORIZATION INFORMATION Primary Insurance: 1D#: Group #:  Secondary Insurance: 1D#: Group #:  SCHEDULE INFORMATION: Date: 11/08/15 Time: Location: [place]

## 2015-10-28 ENCOUNTER — Encounter: Payer: Self-pay | Admitting: Family Medicine

## 2015-10-28 ENCOUNTER — Ambulatory Visit
Admission: RE | Admit: 2015-10-28 | Discharge: 2015-10-28 | Disposition: A | Discharge status: Home / Self Care | Payer: PPO | Source: Ambulatory Visit | Attending: Family Medicine | Admitting: Family Medicine

## 2015-10-28 ENCOUNTER — Telehealth: Payer: Self-pay | Admitting: Family Medicine

## 2015-10-28 ENCOUNTER — Ambulatory Visit (INDEPENDENT_AMBULATORY_CARE_PROVIDER_SITE_OTHER): Payer: PPO | Admitting: Family Medicine

## 2015-10-28 VITALS — BP 130/68 | HR 68 | Temp 97.9°F | Resp 18 | Wt 147.0 lb

## 2015-10-28 DIAGNOSIS — R05 Cough: Secondary | ICD-10-CM | POA: Insufficient documentation

## 2015-10-28 DIAGNOSIS — R059 Cough, unspecified: Secondary | ICD-10-CM

## 2015-10-28 DIAGNOSIS — J441 Chronic obstructive pulmonary disease with (acute) exacerbation: Secondary | ICD-10-CM

## 2015-10-28 MED ORDER — DOXYCYCLINE HYCLATE 100 MG PO TABS: 100.0000 mg | ORAL_TABLET | Freq: Two times a day (BID) | ORAL | Status: DC

## 2015-10-28 MED ORDER — PREDNISONE 10 MG (21) PO TBPK: 10.0000 mg | ORAL_TABLET | Freq: Every day | ORAL | Status: DC

## 2015-10-28 NOTE — Telephone Encounter (Signed)
Pt stated she was returning a nurse call about her Xray results. Thanks TNP

## 2015-10-28 NOTE — Telephone Encounter (Signed)
Tried to call pt back. LMTCB

## 2015-10-28 NOTE — Progress Notes (Signed)
Patient ID: Bianca Shaw, female   DOB: [birth year], 70 y.o.   MRN: [id number]    Subjective:  HPI Cough- Pt is here for a cough. It has been occuring for almost 3 weeks now. She saw Mikki Santee on 10/14/15 and he told her to use OTC products and Gatorade. She has since gotten worse and is now having shortness of breath. She reports that she coughs up yellowish sputum in the mornings but gets better as the day goes on. She also feels like her ears are stopped up, popping and she can not hear out of them. Denies fevers, chills, or sweats.   Prior to Admission medications   Medication Sig Start Date End Date Taking? Authorizing Provider  albuterol (PROVENTIL) (2.5 MG/3ML) 0.083% nebulizer solution Take 3 mLs (2.5 mg total) by nebulization every 6 (six) hours as needed for wheezing or shortness of breath. 06/06/15  Yes Mar Daring, PA-C  ALPRAZolam Duanne Moron) 0.5 MG tablet 1/2 tablet twice daily as needed 07/02/15  Yes Juliyah Mergen Maceo Pro., MD  amLODipine-olmesartan (AZOR) 10-40 MG tablet Take by mouth. 08/08/12  Yes Historical Provider, MD  Ascorbic Acid (VITAMIN C) 100 MG tablet Take 100 mg by mouth daily.     Yes Historical Provider, MD  aspirin 81 MG tablet Take by mouth. 11/26/10  Yes Historical Provider, MD  busPIRone (BUSPAR) 5 MG tablet 1 Tablet, Oral, two times daily 08/11/15  Yes Meli Faley Maceo Pro., MD  Calcium-Magnesium-Vitamin D (CALCIUM MAGNESIUM PO) Take by mouth.   Yes Historical Provider, MD  fexofenadine (ALLEGRA) 180 MG tablet Take by mouth. Reported on 10/14/2015 11/02/12  Yes Historical Provider, MD  fish oil-omega-3 fatty acids 1000 MG capsule Take 2 g by mouth daily.     Yes Historical Provider, MD  fluticasone (FLONASE) 50 MCG/ACT nasal spray Place into the nose. 11/26/10  Yes Historical Provider, MD  Glucosamine-Chondroit-Vit C-Mn (GLUCOSAMINE CHONDR 1500 COMPLX PO) Take 1 capsule by mouth daily.     Yes Historical Provider, MD  ibuprofen (ADVIL,MOTRIN) 200 MG tablet Take 200 mg by  mouth every 6 (six) hours as needed.     Yes Historical Provider, MD  loratadine (CLARITIN) 10 MG tablet Take by mouth. Reported on 10/14/2015 07/18/13  Yes Historical Provider, MD  Magnesium Oxide -Mg Supplement 250 MG TABS Take 1 tablet by mouth daily.     Yes Historical Provider, MD  metoprolol succinate (TOPROL-XL) 25 MG 24 hr tablet Take 1 tablet (25 mg total) by mouth daily. 07/02/15  Yes Iwalani Templeton Maceo Pro., MD  Multiple Vitamin (MULTIVITAMIN) tablet Take 1 tablet by mouth daily.     Yes Historical Provider, MD  omeprazole (PRILOSEC) 20 MG capsule Take 20 mg by mouth daily.     Yes Historical Provider, MD  Psyllium-Calcium (METAMUCIL PLUS CALCIUM PO) Take 1 tablet by mouth daily.     Yes Historical Provider, MD  ondansetron (ZOFRAN) 4 MG tablet Take 1 tablet (4 mg total) by mouth every 8 (eight) hours as needed for nausea or vomiting. Patient not taking: Reported on 10/28/2015 10/14/15   Carmon Ginsberg, PA    Patient Active Problem List   Diagnosis Date Noted  . Anxiety 05/23/2015  . CAFL (chronic airflow limitation) ([place]) 05/23/2015  . Bloodgood disease 05/23/2015  . Essential (primary) hypertension 05/23/2015  . Acid reflux 05/23/2015  . Hypercholesteremia 05/23/2015  . Below normal amount of sodium in the blood 05/23/2015  . Osteopenia 05/23/2015  . Allergic rhinitis, seasonal 05/23/2015  . HYPERTENSION, BENIGN  12/26/2008  . ACUTE IDIOPATHIC PERICARDITIS 11/21/2008  . UNSPECIFIED ANEMIA 10/03/2008    Past Medical History  Diagnosis Date  . Anemia   . Osteopenia   . Anxiety disorder   . Pericarditis     diagnonsed June, 2010, unclear etiology as of yer  . UTI (lower urinary tract infection)   . H/O: hysterectomy   . Osteoporosis     Social History   Social History  . Marital Status: Married    Spouse Name: N/A  . Number of Children: 2  . Years of Education: N/A   Occupational History  . Not on file.   Social History Main Topics  . Smoking status: Former Smoker      Quit date: 11/07/2003  . Smokeless tobacco: Not on file  . Alcohol Use: Yes     Comment: 1-3 a day  . Drug Use: No  . Sexual Activity: Not on file   Other Topics Concern  . Not on file   Social History Narrative   She drinks 1 to 2 caffeinated beverages a day    Allergies  Allergen Reactions  . Diuretic  [Buchu-Cornsilk-Ch Grass-Hydran]     hyponatremia  . Furosemide     hives  . Other     SSRI---hyponatremia    Review of Systems  Constitutional: Positive for malaise/fatigue.  HENT: Positive for congestion, ear pain and tinnitus.   Eyes: Negative.   Respiratory: Positive for cough, sputum production and shortness of breath.   Cardiovascular: Negative.        Chest tightness when breathing  Gastrointestinal: Negative.   Genitourinary: Negative.   Musculoskeletal: Negative.   Skin: Negative.   Neurological: Negative.   Endo/Heme/Allergies: Negative.   Psychiatric/Behavioral: Negative.     Immunization History  Administered Date(s) Administered  . Influenza, High Dose Seasonal PF 02/18/2015  . Pneumococcal Conjugate-13 09/25/2014  . Pneumococcal Polysaccharide-23 04/03/2010  . Zoster 04/09/2011   Objective:  BP 130/68 mmHg  Pulse 68  Temp(Src) 97.9 F (36.6 C) (Oral)  Resp 18  Wt 147 lb (66.679 kg)  SpO2 98%  Physical Exam  Constitutional: She is oriented to person, place, and time and well-developed, well-nourished, and in no distress.  HENT:  Head: Normocephalic and atraumatic.  Right Ear: External ear normal.  Left Ear: External ear normal.  Mouth/Throat: Oropharynx is clear and moist.  Eyes: Conjunctivae and EOM are normal. Pupils are equal, round, and reactive to light.  Neck: Normal range of motion. Neck supple.  Cardiovascular: Normal rate, regular rhythm, normal heart sounds and intact distal pulses.   Pulmonary/Chest: Effort normal. She has wheezes (end expirtory wheezes at bilateral bases).  Abdominal: Soft.  Musculoskeletal: Normal range  of motion.  Neurological: She is alert and oriented to person, place, and time. She has normal reflexes. Gait normal. GCS score is 15.  Skin: Skin is warm and dry.  Psychiatric: Mood, memory, affect and judgment normal.    Lab Results  Component Value Date   WBC 4.1 10/11/2015   HGB 11.5* 01/16/2014   HCT 34.2 10/11/2015   PLT 270 10/11/2015   GLUCOSE 88 10/11/2015   CHOL 208* 10/11/2015   TRIG 64 10/11/2015   HDL 72 10/11/2015   LDLCALC 123* 10/11/2015   TSH 0.954 10/11/2015   INR 0.9 07/29/2012    CMP     Component Value Date/Time   NA 124* 10/11/2015 0832   NA 125* 07/11/2013 1115   NA 133* 11/21/2008 0000   K 4.7 10/11/2015 [id number]  K 4.5 07/11/2013 1115   CL 84* 10/11/2015 0832   CL 90* 07/11/2013 1115   CO2 18 10/11/2015 0832   CO2 25 07/11/2013 1115   GLUCOSE 88 10/11/2015 0832   GLUCOSE 115* 07/11/2013 1115   GLUCOSE 67* 11/21/2008 0000   BUN 15 10/11/2015 0832   BUN 15 07/11/2013 1115   BUN 12 11/21/2008 0000   CREATININE 0.96 10/11/2015 0832   CREATININE 1.0 01/16/2014   CREATININE 1.06 07/11/2013 1115   CALCIUM 9.5 10/11/2015 0832   CALCIUM 9.2 07/11/2013 1115   PROT 8.1 10/11/2015 0832   PROT 8.2 07/11/2013 1115   PROT 8.0 11/21/2008 0000   ALBUMIN 4.5 10/11/2015 0832   ALBUMIN 3.8 07/11/2013 1115   ALBUMIN 4.3 11/21/2008 0000   AST 24 10/11/2015 0832   AST 23 07/11/2013 1115   ALT 14 10/11/2015 0832   ALT 22 07/11/2013 1115   ALKPHOS 76 10/11/2015 0832   ALKPHOS 72 07/11/2013 1115   BILITOT 0.4 10/11/2015 0832   BILITOT 0.3 07/11/2013 1115   BILITOT 0.5 11/21/2008 0000   GFRNONAA 60 10/11/2015 0832   GFRNONAA 54* 07/11/2013 1115   GFRAA 69 10/11/2015 0832   GFRAA >60 07/11/2013 1115    Assessment and Plan :  1. COPD exacerbation (HCC)  - doxycycline (VIBRA-TABS) 100 MG tablet; Take 1 tablet (100 mg total) by mouth 2 (two) times daily.  Dispense: 20 tablet; Refill: 0 - predniSONE (STERAPRED UNI-PAK 21 TAB) 10 MG (21) TBPK tablet; Take 1  tablet (10 mg total) by mouth daily. Take taper as directed  Dispense: 21 tablet; Refill: 0  2. Cough Not bothering patient very much. - DG Chest 2 View; Future   Patient was seen and examined by Dr. Miguel Aschoff, and noted scribed by Webb Laws, [place] MD [facility] 10/28/2015 1:48 PM

## 2015-10-30 NOTE — Telephone Encounter (Signed)
-----   Message from Jerrol Banana., MD sent at 10/28/2015  2:54 PM EDT ----- CXR ok

## 2015-10-30 NOTE — Telephone Encounter (Signed)
Patient advised as below.  

## 2015-10-31 ENCOUNTER — Encounter: Payer: Self-pay | Admitting: Family Medicine

## 2015-10-31 ENCOUNTER — Other Ambulatory Visit: Payer: Self-pay | Admitting: Family Medicine

## 2015-10-31 DIAGNOSIS — Z87891 Personal history of nicotine dependence: Secondary | ICD-10-CM | POA: Insufficient documentation

## 2015-10-31 HISTORY — DX: Personal history of nicotine dependence: Z87.891

## 2015-11-01 ENCOUNTER — Encounter (INDEPENDENT_AMBULATORY_CARE_PROVIDER_SITE_OTHER): Payer: Self-pay

## 2015-11-01 ENCOUNTER — Inpatient Hospital Stay: Payer: PPO | Attending: Family Medicine | Admitting: Family Medicine

## 2015-11-01 ENCOUNTER — Ambulatory Visit
Admission: RE | Admit: 2015-11-01 | Discharge: 2015-11-01 | Disposition: A | Discharge status: Home / Self Care | Payer: PPO | Source: Ambulatory Visit | Attending: Family Medicine | Admitting: Family Medicine

## 2015-11-01 ENCOUNTER — Encounter: Payer: Self-pay | Admitting: Family Medicine

## 2015-11-01 DIAGNOSIS — Z122 Encounter for screening for malignant neoplasm of respiratory organs: Secondary | ICD-10-CM

## 2015-11-01 DIAGNOSIS — Z87891 Personal history of nicotine dependence: Secondary | ICD-10-CM | POA: Insufficient documentation

## 2015-11-01 DIAGNOSIS — I2510 Atherosclerotic heart disease of native coronary artery without angina pectoris: Secondary | ICD-10-CM | POA: Insufficient documentation

## 2015-11-01 DIAGNOSIS — J439 Emphysema, unspecified: Secondary | ICD-10-CM | POA: Insufficient documentation

## 2015-11-01 DIAGNOSIS — I700 Atherosclerosis of aorta: Secondary | ICD-10-CM | POA: Insufficient documentation

## 2015-11-01 NOTE — Progress Notes (Signed)
In accordance with CMS guidelines, patient has meet eligibility criteria including age, absence of signs or symptoms of lung cancer, the specific calculation of cigarette smoking pack-years was 30 years and is a former smoker having quit in 2005.   A shared decision-making session was conducted prior to the performance of CT scan. This includes one or more decision aids, includes benefits and harms of screening, follow-up diagnostic testing, over-diagnosis, false positive rate, and total radiation exposure.  Counseling on the importance of adherence to annual lung cancer LDCT screening, impact of co-morbidities, and ability or willingness to undergo diagnosis and treatment is imperative for compliance of the program.  Counseling on the importance of continued smoking cessation for former smokers; the importance of smoking cessation for current smokers and information about tobacco cessation interventions have been given to patient including the [place] [facility], 1800 quit [place], as well as [place] specific smoking cessation programs.  Written order for lung cancer screening with LDCT has been given to the patient and any and all questions have been answered to the best of my abilities.   Yearly follow up will be scheduled by Burgess Estelle, Thoracic Navigator.

## 2015-11-04 ENCOUNTER — Encounter: Payer: Self-pay | Admitting: *Deleted

## 2015-11-04 ENCOUNTER — Telehealth: Payer: Self-pay | Admitting: *Deleted

## 2015-11-04 NOTE — Telephone Encounter (Signed)
Notified patient of LDCT lung cancer screening results with recommendation for 12 month follow up imaging. Also notified of incidental finding noted below. Patient verbalizes understanding.   IMPRESSION: 1. Lung-RADS Category 2, benign appearance or behavior. Continue annual screening with low-dose chest CT without contrast in 12 months 2. Emphysema 3. Aortic atherosclerosis and coronary artery calcification  

## 2015-11-05 ENCOUNTER — Ambulatory Visit
Admission: RE | Admit: 2015-11-05 | Discharge: 2015-11-05 | Disposition: A | Discharge status: Home / Self Care | Payer: PPO | Source: Ambulatory Visit | Attending: Family Medicine | Admitting: Family Medicine

## 2015-11-05 ENCOUNTER — Other Ambulatory Visit: Payer: Self-pay | Admitting: Family Medicine

## 2015-11-05 DIAGNOSIS — N951 Menopausal and female climacteric states: Secondary | ICD-10-CM | POA: Insufficient documentation

## 2015-11-05 DIAGNOSIS — M810 Age-related osteoporosis without current pathological fracture: Secondary | ICD-10-CM | POA: Insufficient documentation

## 2015-11-05 DIAGNOSIS — Z1239 Encounter for other screening for malignant neoplasm of breast: Secondary | ICD-10-CM

## 2015-11-05 DIAGNOSIS — Z1231 Encounter for screening mammogram for malignant neoplasm of breast: Secondary | ICD-10-CM | POA: Insufficient documentation

## 2015-11-06 ENCOUNTER — Other Ambulatory Visit: Payer: Self-pay | Admitting: Family Medicine

## 2015-11-06 DIAGNOSIS — R928 Other abnormal and inconclusive findings on diagnostic imaging of breast: Secondary | ICD-10-CM

## 2015-11-07 ENCOUNTER — Telehealth: Payer: Self-pay

## 2015-11-07 MED ORDER — ALENDRONATE SODIUM 70 MG PO TABS: 70.0000 mg | ORAL_TABLET | ORAL | Status: DC

## 2015-11-07 NOTE — Discharge Instructions (Signed)

## 2015-11-07 NOTE — Telephone Encounter (Signed)
-----   Message from Jerrol Banana., MD sent at 11/07/2015  8:35 AM EDT ----- Patient osteoporotic. Would recommend Fosamax/alendronate 70 mg once weekly. #4, 12 refills. Return to clinic 2 months to assess tolerance of this med

## 2015-11-07 NOTE — Telephone Encounter (Signed)
Pt advised RX sent in and she will call back for appt-aa

## 2015-11-08 ENCOUNTER — Ambulatory Visit: Payer: PPO | Admitting: Anesthesiology

## 2015-11-08 ENCOUNTER — Encounter
Admission: RE | Disposition: A | Discharge status: Home / Self Care | Payer: Self-pay | Source: Ambulatory Visit | Attending: Gastroenterology

## 2015-11-08 ENCOUNTER — Ambulatory Visit
Admission: RE | Admit: 2015-11-08 | Discharge: 2015-11-08 | Disposition: A | Discharge status: Home / Self Care | Payer: PPO | Source: Ambulatory Visit | Attending: Gastroenterology | Admitting: Gastroenterology

## 2015-11-08 DIAGNOSIS — Z9841 Cataract extraction status, right eye: Secondary | ICD-10-CM | POA: Diagnosis not present

## 2015-11-08 DIAGNOSIS — F419 Anxiety disorder, unspecified: Secondary | ICD-10-CM | POA: Insufficient documentation

## 2015-11-08 DIAGNOSIS — Z825 Family history of asthma and other chronic lower respiratory diseases: Secondary | ICD-10-CM | POA: Insufficient documentation

## 2015-11-08 DIAGNOSIS — K641 Second degree hemorrhoids: Secondary | ICD-10-CM | POA: Insufficient documentation

## 2015-11-08 DIAGNOSIS — Z791 Long term (current) use of non-steroidal anti-inflammatories (NSAID): Secondary | ICD-10-CM | POA: Insufficient documentation

## 2015-11-08 DIAGNOSIS — Z90710 Acquired absence of both cervix and uterus: Secondary | ICD-10-CM | POA: Diagnosis not present

## 2015-11-08 DIAGNOSIS — Z888 Allergy status to other drugs, medicaments and biological substances status: Secondary | ICD-10-CM | POA: Insufficient documentation

## 2015-11-08 DIAGNOSIS — Z818 Family history of other mental and behavioral disorders: Secondary | ICD-10-CM | POA: Insufficient documentation

## 2015-11-08 DIAGNOSIS — Z1211 Encounter for screening for malignant neoplasm of colon: Secondary | ICD-10-CM | POA: Insufficient documentation

## 2015-11-08 DIAGNOSIS — Z7951 Long term (current) use of inhaled steroids: Secondary | ICD-10-CM | POA: Insufficient documentation

## 2015-11-08 DIAGNOSIS — Z8261 Family history of arthritis: Secondary | ICD-10-CM | POA: Insufficient documentation

## 2015-11-08 DIAGNOSIS — Z841 Family history of disorders of kidney and ureter: Secondary | ICD-10-CM | POA: Insufficient documentation

## 2015-11-08 DIAGNOSIS — Z9889 Other specified postprocedural states: Secondary | ICD-10-CM | POA: Diagnosis not present

## 2015-11-08 DIAGNOSIS — Z8049 Family history of malignant neoplasm of other genital organs: Secondary | ICD-10-CM | POA: Diagnosis not present

## 2015-11-08 DIAGNOSIS — Z87891 Personal history of nicotine dependence: Secondary | ICD-10-CM | POA: Insufficient documentation

## 2015-11-08 DIAGNOSIS — Z8249 Family history of ischemic heart disease and other diseases of the circulatory system: Secondary | ICD-10-CM | POA: Insufficient documentation

## 2015-11-08 DIAGNOSIS — Z86010 Personal history of colonic polyps: Secondary | ICD-10-CM | POA: Diagnosis not present

## 2015-11-08 DIAGNOSIS — Z961 Presence of intraocular lens: Secondary | ICD-10-CM | POA: Diagnosis not present

## 2015-11-08 DIAGNOSIS — Z87440 Personal history of urinary (tract) infections: Secondary | ICD-10-CM | POA: Insufficient documentation

## 2015-11-08 DIAGNOSIS — I10 Essential (primary) hypertension: Secondary | ICD-10-CM | POA: Insufficient documentation

## 2015-11-08 DIAGNOSIS — Z7982 Long term (current) use of aspirin: Secondary | ICD-10-CM | POA: Insufficient documentation

## 2015-11-08 DIAGNOSIS — Z79899 Other long term (current) drug therapy: Secondary | ICD-10-CM | POA: Insufficient documentation

## 2015-11-08 DIAGNOSIS — M810 Age-related osteoporosis without current pathological fracture: Secondary | ICD-10-CM | POA: Insufficient documentation

## 2015-11-08 DIAGNOSIS — K5730 Diverticulosis of large intestine without perforation or abscess without bleeding: Secondary | ICD-10-CM | POA: Diagnosis not present

## 2015-11-08 DIAGNOSIS — Z820 Family history of epilepsy and other diseases of the nervous system: Secondary | ICD-10-CM | POA: Diagnosis not present

## 2015-11-08 HISTORY — DX: Essential (primary) hypertension: I10

## 2015-11-08 HISTORY — PX: COLONOSCOPY WITH PROPOFOL: SHX5780

## 2015-11-08 HISTORY — DX: Cough: R05

## 2015-11-08 HISTORY — DX: Presence of dental prosthetic device (complete) (partial): Z97.2

## 2015-11-08 HISTORY — DX: Cough, unspecified: R05.9

## 2015-11-08 HISTORY — DX: Family history of other specified conditions: Z84.89

## 2015-11-08 SURGERY — COLONOSCOPY WITH PROPOFOL
Anesthesia: Monitor Anesthesia Care | Wound class: Contaminated

## 2015-11-08 MED ORDER — LACTATED RINGERS IV SOLN
INTRAVENOUS | Status: DC
  Administered 2015-11-08: 10:00:00 via INTRAVENOUS

## 2015-11-08 MED ORDER — ACETAMINOPHEN 160 MG/5ML PO SOLN: 325.0000 mg | ORAL | Status: DC | PRN

## 2015-11-08 MED ORDER — ACETAMINOPHEN 325 MG PO TABS: 325.0000 mg | ORAL_TABLET | ORAL | Status: DC | PRN

## 2015-11-08 MED ORDER — LACTATED RINGERS IV SOLN: 500.0000 mL | INTRAVENOUS | Status: DC

## 2015-11-08 MED ORDER — PROPOFOL 10 MG/ML IV BOLUS
INTRAVENOUS | Status: DC | PRN
  Administered 2015-11-08 (×6): 20 mg via INTRAVENOUS
  Administered 2015-11-08: 60 mg via INTRAVENOUS

## 2015-11-08 MED ORDER — LIDOCAINE HCL (CARDIAC) 20 MG/ML IV SOLN
INTRAVENOUS | Status: DC | PRN
  Administered 2015-11-08: 50 mg via INTRAVENOUS

## 2015-11-08 MED ORDER — STERILE WATER FOR IRRIGATION IR SOLN
Status: DC | PRN
  Administered 2015-11-08: 11:00:00

## 2015-11-08 SURGICAL SUPPLY — 23 items
CANISTER SUCT 1200ML W/VALVE (MISCELLANEOUS) ×3 IMPLANT
CLIP HMST 235XBRD CATH ROT (MISCELLANEOUS) IMPLANT
CLIP RESOLUTION 360 11X235 (MISCELLANEOUS)
FCP ESCP3.2XJMB 240X2.8X (MISCELLANEOUS)
FORCEPS BIOP RAD 4 LRG CAP 4 (CUTTING FORCEPS) IMPLANT
FORCEPS BIOP RJ4 240 W/NDL (MISCELLANEOUS)
FORCEPS ESCP3.2XJMB 240X2.8X (MISCELLANEOUS) IMPLANT
GOWN CVR UNV OPN BCK APRN NK (MISCELLANEOUS) ×2 IMPLANT
GOWN ISOL THUMB LOOP REG UNIV (MISCELLANEOUS) ×6
INJECTOR VARIJECT VIN23 (MISCELLANEOUS) IMPLANT
KIT DEFENDO VALVE AND CONN (KITS) IMPLANT
KIT ENDO PROCEDURE OLY (KITS) ×3 IMPLANT
MARKER SPOT ENDO TATTOO 5ML (MISCELLANEOUS) IMPLANT
PAD GROUND ADULT SPLIT (MISCELLANEOUS) IMPLANT
PROBE APC STR FIRE (PROBE) IMPLANT
RETRIEVER NET ROTH 2.5X230 LF (MISCELLANEOUS) ×3 IMPLANT
SNARE SHORT THROW 13M SML OVAL (MISCELLANEOUS) IMPLANT
SNARE SHORT THROW 30M LRG OVAL (MISCELLANEOUS) IMPLANT
SNARE SNG USE RND 15MM (INSTRUMENTS) IMPLANT
SPOT EX ENDOSCOPIC TATTOO (MISCELLANEOUS)
TRAP ETRAP POLY (MISCELLANEOUS) IMPLANT
VARIJECT INJECTOR VIN23 (MISCELLANEOUS)
WATER STERILE IRR 250ML POUR (IV SOLUTION) ×3 IMPLANT

## 2015-11-08 NOTE — Anesthesia Postprocedure Evaluation (Signed)
Anesthesia Post Note  Patient: Bianca Shaw  Procedure(s) Performed: Procedure(s) (LRB): COLONOSCOPY WITH PROPOFOL (N/A)  Patient location during evaluation: PACU Anesthesia Type: MAC Level of consciousness: awake and alert Pain management: pain level controlled Vital Signs Assessment: post-procedure vital signs reviewed and stable Respiratory status: spontaneous breathing, nonlabored ventilation and respiratory function stable Cardiovascular status: blood pressure returned to baseline and stable Postop Assessment: no signs of nausea or vomiting Anesthetic complications: no    Jaklyn Alen D Dottie Vaquerano

## 2015-11-08 NOTE — Transfer of Care (Signed)
Immediate Anesthesia Transfer of Care Note  Patient: Bianca Shaw  Procedure(s) Performed: Procedure(s): COLONOSCOPY WITH PROPOFOL (N/A)  Patient Location: PACU  Anesthesia Type: MAC  Level of Consciousness: awake, alert  and patient cooperative  Airway and Oxygen Therapy: Patient Spontanous Breathing and Patient connected to supplemental oxygen  Post-op Assessment: Post-op Vital signs reviewed, Patient's Cardiovascular Status Stable, Respiratory Function Stable, Patent Airway and No signs of Nausea or vomiting  Post-op Vital Signs: Reviewed and stable  Complications: No apparent anesthesia complications

## 2015-11-08 NOTE — Anesthesia Preprocedure Evaluation (Addendum)
Anesthesia Evaluation  Patient identified by MRN, date of birth, ID band Patient awake    Reviewed: Allergy & Precautions, H&P , NPO status , Patient's Chart, lab work & pertinent test results, reviewed documented beta blocker date and time   Airway Mallampati: II  TM Distance: >3 FB Neck ROM: full    Dental  (+) Upper Dentures   Pulmonary COPD,  COPD inhaler, former smoker,    Pulmonary exam normal breath sounds clear to auscultation       Cardiovascular Exercise Tolerance: Good hypertension,  Rhythm:regular Rate:Normal     Neuro/Psych PSYCHIATRIC DISORDERS negative neurological ROS     GI/Hepatic Neg liver ROS, GERD  Medicated,  Endo/Other  negative endocrine ROS  Renal/GU negative Renal ROS  negative genitourinary   Musculoskeletal   Abdominal   Peds  Hematology  (+) anemia ,   Anesthesia Other Findings   Reproductive/Obstetrics negative OB ROS                            Anesthesia Physical Anesthesia Plan  ASA: III  Anesthesia Plan: MAC   Post-op Pain Management:    Induction:   Airway Management Planned:   Additional Equipment:   Intra-op Plan:   Post-operative Plan:   Informed Consent: I have reviewed the patients History and Physical, chart, labs and discussed the procedure including the risks, benefits and alternatives for the proposed anesthesia with the patient or authorized representative who has indicated his/her understanding and acceptance.     Plan Discussed with: CRNA  Anesthesia Plan Comments:         Anesthesia Quick Evaluation

## 2015-11-08 NOTE — Op Note (Signed)
[facility] Gastroenterology Patient Name: Bianca Shaw Procedure Date: 11/08/2015 11:03 AM MRN: [id number] Account #: [id number] Date of Birth: 11-Jun-1945 Admit Type: Outpatient Age: 70 Room: [facility] OR ROOM 01 Gender: Female Note Status: Finalized Procedure:            Colonoscopy Indications:          Screening for colorectal malignant neoplasm Providers:            Lucilla Lame MD, MD Referring MD:         Janine Ores. Rosanna Randy, MD (Referring MD) Medicines:            Propofol per Anesthesia Complications:        No immediate complications. Procedure:            Pre-Anesthesia Assessment:                       - Prior to the procedure, a History and Physical was                        performed, and patient medications and allergies were                        reviewed. The patient's tolerance of previous                        anesthesia was also reviewed. The risks and benefits of                        the procedure and the sedation options and risks were                        discussed with the patient. All questions were                        answered, and informed consent was obtained. Prior                        Anticoagulants: The patient has taken no previous                        anticoagulant or antiplatelet agents. ASA Grade                        Assessment: II - A patient with mild systemic disease.                        After reviewing the risks and benefits, the patient was                        deemed in satisfactory condition to undergo the                        procedure.                       After obtaining informed consent, the colonoscope was                        passed under direct vision. Throughout the procedure,  the patient's blood pressure, pulse, and oxygen                        saturations were monitored continuously. The Olympus CF                        H180AL colonoscope (S#: [id number]) was  introduced through                        the anus and advanced to the the cecum, identified by                        appendiceal orifice and ileocecal valve. The                        colonoscopy was performed without difficulty. The                        patient tolerated the procedure well. The quality of                        the bowel preparation was excellent. Findings:      The perianal and digital rectal examinations were normal.      Multiple small-mouthed diverticula were found in the entire colon.      Non-bleeding internal hemorrhoids were found during retroflexion. The       hemorrhoids were Grade II (internal hemorrhoids that prolapse but reduce       spontaneously). Impression:           - Diverticulosis in the entire examined colon.                       - Non-bleeding internal hemorrhoids.                       - No specimens collected. Recommendation:       - Repeat colonoscopy in 10 years for screening unless                        any change in family history or lower GI problems. Procedure Code(s):    --- Professional ---                       [phone], Colonoscopy, flexible; diagnostic, including                        collection of specimen(s) by brushing or washing, when                        performed (separate procedure) Diagnosis Code(s):    --- Professional ---                       Z12.11, Encounter for screening for malignant neoplasm                        of colon CPT copyright 2016 American Medical Association. All rights reserved. The codes documented in this report are preliminary and upon coder review may  be revised to meet current compliance requirements. Lucilla Lame MD, MD 11/08/2015 11:29:25 AM This report has been signed electronically. Number of Addenda:  0 Note Initiated On: 11/08/2015 11:03 AM Scope Withdrawal Time: 0 hours 6 minutes 13 seconds  Total Procedure Duration: 0 hours 10 minutes 24 seconds       [facility]

## 2015-11-08 NOTE — H&P (Signed)
Bianca Lame, MD [address] [place], [place] [address] Phone: [phone] Fax : [phone]  Primary Care Physician:  Wilhemena Durie, MD Primary Gastroenterologist:  Dr. Allen Norris  Pre-Procedure History & Physical: HPI:  Bianca Shaw is a 70 y.o. female is here for a screening colonoscopy.   Past Medical History  Diagnosis Date  . Anemia   . Osteopenia   . Anxiety disorder   . Pericarditis     diagnonsed June, 2010, unclear etiology as of yer  . UTI (lower urinary tract infection)   . H/O: hysterectomy   . Osteoporosis   . Personal history of tobacco use, presenting hazards to health 10/31/2015  . Family history of adverse reaction to anesthesia     sister - PONV  . Wears dentures     full upper  . Cough     lingering, mild, finished Prednisone and anitbiotic 11/03/15  . Hypertension     Past Surgical History  Procedure Laterality Date  . Vesicovaginal fistula closure w/ tah    . Tubal ligation    . Cataract extraction w/ intraocular lens implant Right   . Breast biopsy Right 2012    core - neg    Prior to Admission medications   Medication Sig Start Date End Date Taking? Authorizing Provider  albuterol (PROVENTIL) (2.5 MG/3ML) 0.083% nebulizer solution Take 3 mLs (2.5 mg total) by nebulization every 6 (six) hours as needed for wheezing or shortness of breath. 06/06/15  Yes Mar Daring, PA-C  ALPRAZolam Duanne Moron) 0.5 MG tablet 1/2 tablet twice daily as needed 07/02/15  Yes Richard Maceo Pro., MD  amLODipine-olmesartan (AZOR) 10-40 MG tablet Take by mouth. 08/08/12  Yes Historical Provider, MD  Ascorbic Acid (VITAMIN C) 100 MG tablet Take 100 mg by mouth daily.     Yes Historical Provider, MD  aspirin 81 MG tablet Take by mouth. 11/26/10  Yes Historical Provider, MD  busPIRone (BUSPAR) 5 MG tablet 1 Tablet, Oral, two times daily 08/11/15  Yes Richard Maceo Pro., MD  Calcium-Magnesium-Vitamin D (CALCIUM MAGNESIUM PO) Take by mouth.   Yes  Historical Provider, MD  fish oil-omega-3 fatty acids 1000 MG capsule Take 2 g by mouth daily.     Yes Historical Provider, MD  fluticasone (FLONASE) 50 MCG/ACT nasal spray Place into the nose. 11/26/10  Yes Historical Provider, MD  Glucosamine-Chondroit-Vit C-Mn (GLUCOSAMINE CHONDR 1500 COMPLX PO) Take 1 capsule by mouth daily.     Yes Historical Provider, MD  ibuprofen (ADVIL,MOTRIN) 200 MG tablet Take 200 mg by mouth every 6 (six) hours as needed.     Yes Historical Provider, MD  loratadine (CLARITIN) 10 MG tablet Take by mouth. Reported on 10/14/2015 07/18/13  Yes Historical Provider, MD  Magnesium Oxide -Mg Supplement 250 MG TABS Take 1 tablet by mouth daily.     Yes Historical Provider, MD  metoprolol succinate (TOPROL-XL) 25 MG 24 hr tablet Take 1 tablet (25 mg total) by mouth daily. 07/02/15  Yes Richard Maceo Pro., MD  Multiple Vitamin (MULTIVITAMIN) tablet Take 1 tablet by mouth daily.     Yes Historical Provider, MD  omeprazole (PRILOSEC) 20 MG capsule Take 20 mg by mouth daily.     Yes Historical Provider, MD  Psyllium-Calcium (METAMUCIL PLUS CALCIUM PO) Take 1 tablet by mouth daily.     Yes Historical Provider, MD  alendronate (FOSAMAX) 70 MG tablet Take 1 tablet (70 mg total) by mouth every 7 (seven) days. Take with a  full glass of water on an empty stomach. Patient not taking: Reported on 11/08/2015 11/07/15   Jerrol Banana., MD    Allergies as of 10/23/2015 - Review Complete 10/14/2015  Allergen Reaction Noted  . Diuretic  [buchu-cornsilk-ch grass-hydran]  05/23/2015  . Furosemide  05/23/2015  . Other  05/23/2015    Family History  Problem Relation Age of Onset  . Uterine cancer    . Diabetes    . Colon cancer Neg Hx   . Stomach cancer Neg Hx   . Breast cancer Neg Hx   . Heart failure Mother   . Epilepsy Mother   . COPD Father   . Heart disease Father   . Anxiety disorder Sister   . Arthritis Brother   . Heart disease Brother   . Vaginal cancer Paternal  Grandmother   . Heart attack Paternal Grandfather   . COPD Brother   . Kidney failure Brother   . COPD Brother   . Arthritis Sister     Social History   Social History  . Marital Status: Married    Spouse Name: N/A  . Number of Children: 2  . Years of Education: N/A   Occupational History  . Not on file.   Social History Main Topics  . Smoking status: Former Smoker -- 0.75 packs/day for 40 years    Quit date: 11/07/2003  . Smokeless tobacco: Not on file  . Alcohol Use: 4.2 oz/week    7 Glasses of wine per week     Comment: 1-3 a day  . Drug Use: No  . Sexual Activity: Not on file   Other Topics Concern  . Not on file   Social History Narrative   She drinks 1 to 2 caffeinated beverages a day    Review of Systems: See HPI, otherwise negative ROS  Physical Exam: BP 160/80 mmHg  Pulse 64  Temp(Src) 97.2 F (36.2 C) (Temporal)  Resp 16  Ht 5\' 1"  (1.549 m)  Wt 142 lb (64.411 kg)  BMI 26.84 kg/m2  SpO2 97% General:   Alert,  pleasant and cooperative in NAD Head:  Normocephalic and atraumatic. Neck:  Supple; no masses or thyromegaly. Lungs:  Clear throughout to auscultation.    Heart:  Regular rate and rhythm. Abdomen:  Soft, nontender and nondistended. Normal bowel sounds, without guarding, and without rebound.   Neurologic:  Alert and  oriented x4;  grossly normal neurologically.  Impression/Plan: [name] is now here to undergo a screening colonoscopy.  Risks, benefits, and alternatives regarding colonoscopy have been reviewed with the patient.  Questions have been answered.  All parties agreeable.

## 2015-11-08 NOTE — Anesthesia Procedure Notes (Signed)
Procedure Name: MAC Performed by: Nimrod Wendt Pre-anesthesia Checklist: Patient identified, Emergency Drugs available, Suction available, Timeout performed and Patient being monitored Patient Re-evaluated:Patient Re-evaluated prior to inductionOxygen Delivery Method: Nasal cannula Placement Confirmation: positive ETCO2       

## 2015-11-11 ENCOUNTER — Encounter: Payer: Self-pay | Admitting: Gastroenterology

## 2015-11-21 ENCOUNTER — Ambulatory Visit
Admission: RE | Admit: 2015-11-21 | Discharge: 2015-11-21 | Disposition: A | Discharge status: Home / Self Care | Payer: PPO | Source: Ambulatory Visit | Attending: Family Medicine | Admitting: Family Medicine

## 2015-11-21 DIAGNOSIS — R928 Other abnormal and inconclusive findings on diagnostic imaging of breast: Secondary | ICD-10-CM

## 2015-11-21 DIAGNOSIS — N63 Unspecified lump in breast: Secondary | ICD-10-CM | POA: Insufficient documentation

## 2015-11-21 DIAGNOSIS — R922 Inconclusive mammogram: Secondary | ICD-10-CM | POA: Diagnosis not present

## 2015-11-22 ENCOUNTER — Other Ambulatory Visit: Payer: Self-pay | Admitting: Family Medicine

## 2015-11-22 DIAGNOSIS — N6320 Unspecified lump in the left breast, unspecified quadrant: Secondary | ICD-10-CM

## 2015-11-26 DIAGNOSIS — C50412 Malignant neoplasm of upper-outer quadrant of left female breast: Secondary | ICD-10-CM

## 2015-11-26 HISTORY — DX: Malignant neoplasm of upper-outer quadrant of left female breast: C50.412

## 2015-12-03 ENCOUNTER — Ambulatory Visit
Admission: RE | Admit: 2015-12-03 | Discharge: 2015-12-03 | Disposition: A | Discharge status: Home / Self Care | Payer: PPO | Source: Ambulatory Visit | Attending: Family Medicine | Admitting: Family Medicine

## 2015-12-03 DIAGNOSIS — C50919 Malignant neoplasm of unspecified site of unspecified female breast: Secondary | ICD-10-CM

## 2015-12-03 DIAGNOSIS — C50912 Malignant neoplasm of unspecified site of left female breast: Secondary | ICD-10-CM | POA: Insufficient documentation

## 2015-12-03 DIAGNOSIS — N6320 Unspecified lump in the left breast, unspecified quadrant: Secondary | ICD-10-CM

## 2015-12-03 DIAGNOSIS — C50412 Malignant neoplasm of upper-outer quadrant of left female breast: Secondary | ICD-10-CM | POA: Diagnosis not present

## 2015-12-03 DIAGNOSIS — N63 Unspecified lump in breast: Secondary | ICD-10-CM | POA: Diagnosis not present

## 2015-12-03 DIAGNOSIS — C801 Malignant (primary) neoplasm, unspecified: Secondary | ICD-10-CM

## 2015-12-03 HISTORY — DX: Malignant (primary) neoplasm, unspecified: C80.1

## 2015-12-03 HISTORY — PX: BREAST BIOPSY: SHX20

## 2015-12-03 HISTORY — DX: Malignant neoplasm of unspecified site of unspecified female breast: C50.919

## 2015-12-05 ENCOUNTER — Other Ambulatory Visit: Payer: Self-pay | Admitting: Emergency Medicine

## 2015-12-05 DIAGNOSIS — C50912 Malignant neoplasm of unspecified site of left female breast: Secondary | ICD-10-CM

## 2015-12-06 DIAGNOSIS — C50412 Malignant neoplasm of upper-outer quadrant of left female breast: Secondary | ICD-10-CM | POA: Insufficient documentation

## 2015-12-06 DIAGNOSIS — Z170 Estrogen receptor positive status [ER+]: Secondary | ICD-10-CM | POA: Insufficient documentation

## 2015-12-09 ENCOUNTER — Encounter: Payer: Self-pay | Admitting: *Deleted

## 2015-12-10 ENCOUNTER — Inpatient Hospital Stay: Payer: PPO | Attending: Hematology and Oncology | Admitting: Hematology and Oncology

## 2015-12-10 ENCOUNTER — Inpatient Hospital Stay: Payer: PPO | Admitting: Hematology and Oncology

## 2015-12-10 ENCOUNTER — Encounter: Payer: Self-pay | Admitting: Hematology and Oncology

## 2015-12-10 ENCOUNTER — Inpatient Hospital Stay: Payer: PPO

## 2015-12-10 DIAGNOSIS — Z7982 Long term (current) use of aspirin: Secondary | ICD-10-CM | POA: Insufficient documentation

## 2015-12-10 DIAGNOSIS — C50412 Malignant neoplasm of upper-outer quadrant of left female breast: Secondary | ICD-10-CM | POA: Diagnosis not present

## 2015-12-10 DIAGNOSIS — I10 Essential (primary) hypertension: Secondary | ICD-10-CM | POA: Diagnosis not present

## 2015-12-10 DIAGNOSIS — Z90710 Acquired absence of both cervix and uterus: Secondary | ICD-10-CM | POA: Diagnosis not present

## 2015-12-10 DIAGNOSIS — Z79899 Other long term (current) drug therapy: Secondary | ICD-10-CM | POA: Diagnosis not present

## 2015-12-10 DIAGNOSIS — E871 Hypo-osmolality and hyponatremia: Secondary | ICD-10-CM

## 2015-12-10 DIAGNOSIS — F419 Anxiety disorder, unspecified: Secondary | ICD-10-CM | POA: Insufficient documentation

## 2015-12-10 DIAGNOSIS — Z170 Estrogen receptor positive status [ER+]: Secondary | ICD-10-CM | POA: Diagnosis not present

## 2015-12-10 DIAGNOSIS — Z87891 Personal history of nicotine dependence: Secondary | ICD-10-CM | POA: Diagnosis not present

## 2015-12-10 LAB — COMPREHENSIVE METABOLIC PANEL
ALT: 16 U/L (ref 14–54)
AST: 22 U/L (ref 15–41)
Albumin: 4.3 g/dL (ref 3.5–5.0)
Alkaline Phosphatase: 59 U/L (ref 38–126)
Anion gap: 7 (ref 5–15)
BUN: 18 mg/dL (ref 6–20)
CO2: 24 mmol/L (ref 22–32)
Calcium: 9 mg/dL (ref 8.9–10.3)
Chloride: 95 mmol/L — ABNORMAL LOW (ref 101–111)
Creatinine, Ser: 0.82 mg/dL (ref 0.44–1.00)
GFR calc Af Amer: >60 mL/min (ref >60)
GFR calc non Af Amer: >60 mL/min (ref >60)
Glucose, Bld: 95 mg/dL (ref 65–99)
Potassium: 4.5 mmol/L (ref 3.5–5.1)
Sodium: 126 mmol/L — ABNORMAL LOW (ref 135–145)
Total Bilirubin: 0.4 mg/dL (ref 0.3–1.2)
Total Protein: 8 g/dL (ref 6.5–8.1)

## 2015-12-10 LAB — CBC WITH DIFFERENTIAL/PLATELET
Basophils Absolute: 0.1 10*3/uL (ref 0–0.1)
Basophils Relative: 1 %
Eosinophils Absolute: 0.4 10*3/uL (ref 0–0.7)
Eosinophils Relative: 6 %
HCT: 32 % — ABNORMAL LOW (ref 35.0–47.0)
Hemoglobin: 11.3 g/dL — ABNORMAL LOW (ref 12.0–16.0)
Lymphocytes Relative: 18 %
Lymphs Abs: 1.3 10*3/uL (ref 1.0–3.6)
MCH: 34.7 pg — ABNORMAL HIGH (ref 26.0–34.0)
MCHC: 35.2 g/dL (ref 32.0–36.0)
MCV: 98.4 fL (ref 80.0–100.0)
Monocytes Absolute: 1.1 10*3/uL — ABNORMAL HIGH (ref 0.2–0.9)
Monocytes Relative: 16 %
Neutro Abs: 4 10*3/uL (ref 1.4–6.5)
Neutrophils Relative %: 59 %
Platelets: 259 10*3/uL (ref 150–440)
RBC: 3.25 MIL/uL — ABNORMAL LOW (ref 3.80–5.20)
RDW: 14.3 % (ref 11.5–14.5)
WBC: 6.9 10*3/uL (ref 3.6–11.0)

## 2015-12-10 NOTE — Progress Notes (Signed)
[facility] day:  12/10/2015  Chief Complaint: Bianca Shaw is a 70 y.o. female with left breast cancer who is referred in consultation by Dr. Rosanna Randy for assessment and management.  HPI:  The patient has had yearly mammograms.  Screening mammogram on 11/05/2015 revealed architectural distortion in the left breast.  Left mammogram and ultrasound on 11/21/2015 revealed a 4.2 x 2.4 x 2.6 cm irregular hypoechoic mass in the 1-2 o'clock position in the left breast 3 cm from the nipple.  Survey of the axilla revealed normal appearing left axillary lymph nodes.  Biopsy and clip placement on 12/06/2015 revealed grade II invasive lobular carcinoma. Tumor is ER positive (> 90%), PR positive (> 90%), and Her2/neu 2+ (eqivocal).  She underwent menses at age 49.  She had 2 children.  She underwent menopause at age 27.  She did not breast feed her children.  She tried birth control pills.    Bone density on 11/05/2015 revealed osteoporosis with a T score of -2.9 in the AP spine L1-L2 and -1.9 in the left femoral neck.  She states that she started Fosamax 1 month ago.  She is on calcium and vitamin D.  Symptomatically, she denies any complaints.  She notes scleroderma and changes in her skin where she has contact with her bra or pants with notable skin thickening.  She has no family history of breast cancer.  Her paternal grandmother had uterine cancer.  There is no family history of ovarian cancer.   Past Medical History:  Diagnosis Date  . Anemia   . Anxiety disorder   . Cough    lingering, mild, finished Prednisone and anitbiotic 11/03/15  . Family history of adverse reaction to anesthesia    sister - PONV  . H/O: hysterectomy   . Hypertension   . Osteopenia   . Osteoporosis   . Pericarditis    diagnonsed June, 2010, unclear etiology as of yer  . Personal history of tobacco use, presenting hazards to health 10/31/2015  . UTI (lower urinary tract  infection)   . Wears dentures    full upper    Past Surgical History:  Procedure Laterality Date  . BREAST BIOPSY Right 2012   core - neg  . BREAST BIOPSY Left 12/03/2015   path pending  . CATARACT EXTRACTION W/ INTRAOCULAR LENS IMPLANT Right   . COLONOSCOPY WITH PROPOFOL N/A 11/08/2015   Procedure: COLONOSCOPY WITH PROPOFOL;  Surgeon: Lucilla Lame, MD;  Location: [facility];  Service: Endoscopy;  Laterality: N/A;  . TUBAL LIGATION    . VESICOVAGINAL FISTULA CLOSURE W/ TAH      Family History  Problem Relation Age of Onset  . Heart failure Mother   . Epilepsy Mother   . COPD Father   . Heart disease Father   . Anxiety disorder Sister   . Arthritis Brother   . Heart disease Brother   . Vaginal cancer Paternal Grandmother   . Heart attack Paternal Grandfather   . COPD Brother   . Kidney failure Brother   . COPD Brother   . Arthritis Sister   . Uterine cancer    . Diabetes    . Colon cancer Neg Hx   . Stomach cancer Neg Hx   . Breast cancer Neg Hx     Social History:  reports that she quit smoking about 12 years ago. She has a 30.00 pack-year smoking history. She does not have any smokeless tobacco history  on file. She reports that she drinks about 4.2 oz of alcohol per week . She reports that she does not use drugs.  She has 5 brothers and 2 sisters.  She has 2 children who are alive and well.  She lives in [place] with her husband, Pat Patrick.  The patient is accompanied by her husband today.  Allergies:  Allergies  Allergen Reactions  . Diuretic  [Buchu-Cornsilk-Ch Grass-Hydran]     hyponatremia  . Furosemide     hives  . Other     SSRI---hyponatremia  . Pimenta Nausea And Vomiting    Current Medications: Current Outpatient Prescriptions  Medication Sig Dispense Refill  . albuterol (PROVENTIL) (2.5 MG/3ML) 0.083% nebulizer solution Take 3 mLs (2.5 mg total) by nebulization every 6 (six) hours as needed for wheezing or shortness of breath. 75 mL 12  .  alendronate (FOSAMAX) 70 MG tablet Take 1 tablet (70 mg total) by mouth every 7 (seven) days. Take with a full glass of water on an empty stomach. 4 tablet 11  . ALPRAZolam (XANAX) 0.5 MG tablet 1/2 tablet twice daily as needed 60 tablet 3  . amLODipine-olmesartan (AZOR) 10-40 MG tablet Take by mouth.    . Ascorbic Acid (VITAMIN C) 100 MG tablet Take 100 mg by mouth daily.      Marland Kitchen aspirin 81 MG tablet Take by mouth.    . busPIRone (BUSPAR) 5 MG tablet 1 Tablet, Oral, two times daily 60 tablet 2  . Calcium-Magnesium-Vitamin D (CALCIUM MAGNESIUM PO) Take by mouth.    . fish oil-omega-3 fatty acids 1000 MG capsule Take 2 g by mouth daily.      . fluticasone (FLONASE) 50 MCG/ACT nasal spray Place into the nose.    . Glucosamine-Chondroit-Vit C-Mn (GLUCOSAMINE CHONDR 1500 COMPLX PO) Take 1 capsule by mouth daily.      Marland Kitchen ibuprofen (ADVIL,MOTRIN) 200 MG tablet Take 200 mg by mouth every 6 (six) hours as needed.      . loratadine (CLARITIN) 10 MG tablet Take by mouth. Reported on 10/14/2015    . Magnesium Oxide -Mg Supplement 250 MG TABS Take 1 tablet by mouth daily.      . metoprolol succinate (TOPROL-XL) 25 MG 24 hr tablet Take 1 tablet (25 mg total) by mouth daily. 30 tablet 3  . Multiple Vitamin (MULTIVITAMIN) tablet Take 1 tablet by mouth daily.      Marland Kitchen omeprazole (PRILOSEC) 20 MG capsule Take 20 mg by mouth daily.      . Psyllium-Calcium (METAMUCIL PLUS CALCIUM PO) Take 1 tablet by mouth daily.       No current facility-administered medications for this visit.     Review of Systems:  GENERAL:  Feels good.  No fevers, sweats or weight loss. PERFORMANCE STATUS (ECOG): 0 HEENT:  No visual changes, runny nose, sore throat, mouth sores or tenderness. Lungs: No shortness of breath or cough.  No hemoptysis. Cardiac:  No chest pain, palpitations, orthopnea, or PND. GI:  No nausea, vomiting, diarrhea, constipation, melena or hematochezia.  Colonoscopy done 09/2015. GU:  No urgency, frequency, dysuria,  or hematuria. Musculoskeletal:  Osteoporosis on Fosamax.  No back pain.  No joint pain.  No muscle tenderness. Extremities:  No pain or swelling. Skin:  Skin changes due to scleroderma.  Bruise after biopsy.  No rashes or skin changes. Neuro:  No headache, numbness or weakness, balance or coordination issues. Endocrine:  No diabetes, thyroid issues, hot flashes or night sweats. Psych:  No mood changes, depression or  anxiety. Pain:  No focal pain. Review of systems:  All other systems reviewed and found to be negative.  Physical Exam: Blood pressure (!) 153/78, pulse 62, temperature 97.8 F (36.6 C), temperature source Tympanic, resp. rate 18, weight 149 lb 9.3 oz (67.8 kg). GENERAL:  Well developed, well nourished, woman sitting comfortably in the exam room in no acute distress. MENTAL STATUS:  Alert and oriented to person, place and time. HEAD:  Short gray hair.  Normocephalic, atraumatic, face symmetric, no Cushingoid features. EYES:  Glasses propped on head.  Pupils equal round and reactive to light and accomodation.  No conjunctivitis or scleral icterus. ENT:  Oropharynx clear without lesion.  Tongue normal. Mucous membranes moist.  RESPIRATORY:  Clear to auscultation without rales, wheezes or rhonchi. CARDIOVASCULAR:  Regular rate and rhythm without murmur, rub or gallop. BREAST:  Right breast without masses, skin changes or nipple discharge.  Slight skin thickening 6-8 o;clock position due to bra abrasion.  Left breast with a 6 cm mass/hematoma in the upper outer quadrant with overlying ecchymosis. No overlying erythema, edema or nipple discharge. Slight skin thickening inferiorly at the 4-6 o'clock position due to bra abrasion. ABDOMEN:  Soft, non-tender, with active bowel sounds, and no hepatosplenomegaly.  No masses. SKIN:  Areas of skin thickening due to abrasion including breast and waist line.  No rashes, ulcers or lesions. EXTREMITIES: No edema, no skin discoloration or  tenderness.  No palpable cords. LYMPH NODES: No palpable cervical, supraclavicular, axillary or inguinal adenopathy  NEUROLOGICAL: Unremarkable. PSYCH:  Appropriate.   Appointment on 12/10/2015  Component Date Value Ref Range Status  . WBC 12/10/2015 6.9  3.6 - 11.0 K/uL Final  . RBC 12/10/2015 3.25* 3.80 - 5.20 MIL/uL Final  . Hemoglobin 12/10/2015 11.3* 12.0 - 16.0 g/dL Final  . HCT 12/10/2015 32.0* 35.0 - 47.0 % Final  . MCV 12/10/2015 98.4  80.0 - 100.0 fL Final  . MCH 12/10/2015 34.7* 26.0 - 34.0 pg Final  . MCHC 12/10/2015 35.2  32.0 - 36.0 g/dL Final  . RDW 12/10/2015 14.3  11.5 - 14.5 % Final  . Platelets 12/10/2015 259  150 - 440 K/uL Final  . Neutrophils Relative % 12/10/2015 59  % Final  . Neutro Abs 12/10/2015 4.0  1.4 - 6.5 K/uL Final  . Lymphocytes Relative 12/10/2015 18  % Final  . Lymphs Abs 12/10/2015 1.3  1.0 - 3.6 K/uL Final  . Monocytes Relative 12/10/2015 16  % Final  . Monocytes Absolute 12/10/2015 1.1* 0.2 - 0.9 K/uL Final  . Eosinophils Relative 12/10/2015 6  % Final  . Eosinophils Absolute 12/10/2015 0.4  0 - 0.7 K/uL Final  . Basophils Relative 12/10/2015 1  % Final  . Basophils Absolute 12/10/2015 0.1  0 - 0.1 K/uL Final  . Sodium 12/10/2015 126* 135 - 145 mmol/L Final  . Potassium 12/10/2015 4.5  3.5 - 5.1 mmol/L Final  . Chloride 12/10/2015 95* 101 - 111 mmol/L Final  . CO2 12/10/2015 24  22 - 32 mmol/L Final  . Glucose, Bld 12/10/2015 95  65 - 99 mg/dL Final  . BUN 12/10/2015 18  6 - 20 mg/dL Final  . Creatinine, Ser 12/10/2015 0.82  0.44 - 1.00 mg/dL Final  . Calcium 12/10/2015 9.0  8.9 - 10.3 mg/dL Final  . Total Protein 12/10/2015 8.0  6.5 - 8.1 g/dL Final  . Albumin 12/10/2015 4.3  3.5 - 5.0 g/dL Final  . AST 12/10/2015 22  15 - 41 U/L Final  .  ALT 12/10/2015 16  14 - 54 U/L Final  . Alkaline Phosphatase 12/10/2015 59  38 - 126 U/L Final  . Total Bilirubin 12/10/2015 0.4  0.3 - 1.2 mg/dL Final  . GFR calc non Af Amer 12/10/2015 >60  >60  mL/min Final  . GFR calc Af Amer 12/10/2015 >60  >60 mL/min Final   Comment: (NOTE) The eGFR has been calculated using the CKD EPI equation. This calculation has not been validated in all clinical situations. eGFR's persistently <60 mL/min signify possible Chronic Kidney Disease.   . Anion gap 12/10/2015 7  5 - 15 Final  . CA 27.29 12/11/2015 27.3  0.0 - 38.6 U/mL Final   Comment: (NOTE) [employer]/ACS methodology Performed At: [facility] [address] [place], [place] [id number] Lindon Romp MD JF:[id number]     Assessment:  Bianca Shaw is a 70 y.o. female with clinical stage IIA (T2N0) left breast cancer.  Left mammogram and ultrasound on 11/21/2015 revealed a 4.2 x 2.4 x 2.6 cm irregular hypoechoic mass in the 1-2 o'clock position in the left breast 3 cm from the nipple.  Left axillary lymph nodes appeared normal.  Biopsy on 12/06/2015 revealed grade II invasive lobular carcinoma. Tumor is ER positive (> 90%), PR positive (> 90%), and Her2/neu 2+ (eqivocal).  Bone density on 11/05/2015 revealed osteoporosis with a T score of -2.9 in the AP spine L1-L2 and -1.9 in the left femoral neck.  She states that she started Fosamax 1 month ago.  She has scleroderma.  She has skin thickening at sites of abrasion.  Symptomatically, she denies any complaints.  Exam reveals a mass/hematoma in the left upper outer quadrant s/p biopsy.    Plan: 1.  Discuss diagnosis, staging, and management of breast cancer.  Discuss hormone receptor status.  Follow-up Her2/neu by FISH.  Discuss mastectomy versus lumpectomy with radiation.  Discuss potential issues with scleroderma and radiation.  Discuss surgical evaluation (she has an appointment with Dr. Bary Castilla on 12/12/2015) as well as Dr Baruch Gouty, radiation oncologist.  Discuss hormonal therapy (tamoxifen versus aromatase inhibitors). Discuss chemotherapy.  Discuss MammaPrint testing. 2.  Follow-up Her2/neu by FISH. 3.  Labs today:   CBC with diff, CMP, CA27.29. 4.  Consult with Dr. Bary Castilla on 12/12/2015 as scheduled. 5.  Consult Dr Baruch Gouty on 12/19/2015. 6.  Anticipate follow-up after surgery.  Addendum:  The patient has chronic hyponatremia (Na 121 on 08/08/2012, 126 on 01/16/2014, and 124 on 10/11/2015) of unclear etiology.   Lequita Asal, MD  12/10/2015

## 2015-12-10 NOTE — Progress Notes (Signed)
Patient is here for new patient evaluation  

## 2015-12-11 ENCOUNTER — Encounter: Payer: Self-pay | Admitting: General Surgery

## 2015-12-11 ENCOUNTER — Telehealth: Payer: Self-pay | Admitting: *Deleted

## 2015-12-11 LAB — CANCER ANTIGEN 27.29: CA 27.29: 27.3 U/mL (ref 0.0–38.6)

## 2015-12-11 NOTE — Telephone Encounter (Signed)
Pt notified of lab results and results have been faxed to PCP, Dr. Rosanna Randy.

## 2015-12-11 NOTE — Telephone Encounter (Signed)
-----   Message from Lequita Asal, MD sent at 12/11/2015  9:05 AM EDT ----- Regarding: Please call patient and forward electrolytes to PCP  Sodium remains low (126).  Outside labs 2 months ago, sodium was 124.  M  ----- Message ----- From: Interface, Lab In [place] Sent: 12/10/2015   3:13 PM To: Lequita Asal, MD

## 2015-12-12 ENCOUNTER — Encounter: Payer: Self-pay | Admitting: General Surgery

## 2015-12-12 ENCOUNTER — Ambulatory Visit (INDEPENDENT_AMBULATORY_CARE_PROVIDER_SITE_OTHER): Payer: PPO | Admitting: General Surgery

## 2015-12-12 ENCOUNTER — Encounter: Payer: Self-pay | Admitting: Family Medicine

## 2015-12-12 ENCOUNTER — Ambulatory Visit (INDEPENDENT_AMBULATORY_CARE_PROVIDER_SITE_OTHER): Payer: PPO | Admitting: Family Medicine

## 2015-12-12 ENCOUNTER — Telehealth: Payer: Self-pay

## 2015-12-12 ENCOUNTER — Encounter: Payer: Self-pay | Admitting: Hematology and Oncology

## 2015-12-12 VITALS — BP 130/76 | HR 80 | Resp 14 | Ht 61.0 in | Wt 147.0 lb

## 2015-12-12 VITALS — BP 108/64 | HR 68 | Temp 97.8°F | Resp 16 | Wt 148.0 lb

## 2015-12-12 DIAGNOSIS — D0502 Lobular carcinoma in situ of left breast: Secondary | ICD-10-CM

## 2015-12-12 DIAGNOSIS — C50412 Malignant neoplasm of upper-outer quadrant of left female breast: Secondary | ICD-10-CM | POA: Diagnosis not present

## 2015-12-12 DIAGNOSIS — E871 Hypo-osmolality and hyponatremia: Secondary | ICD-10-CM | POA: Diagnosis not present

## 2015-12-12 DIAGNOSIS — C50912 Malignant neoplasm of unspecified site of left female breast: Secondary | ICD-10-CM

## 2015-12-12 DIAGNOSIS — M349 Systemic sclerosis, unspecified: Secondary | ICD-10-CM

## 2015-12-12 NOTE — Progress Notes (Signed)
Subjective:  HPI Pt is here to discuss her sodium levels. She had a met C on 12/10/15 and her sodium was low at 126.   Prior to Admission medications   Medication Sig Start Date End Date Taking? Authorizing Provider  albuterol (PROVENTIL) (2.5 MG/3ML) 0.083% nebulizer solution Take 3 mLs (2.5 mg total) by nebulization every 6 (six) hours as needed for wheezing or shortness of breath. 06/06/15   Mar Daring, PA-C  alendronate (FOSAMAX) 70 MG tablet Take 1 tablet (70 mg total) by mouth every 7 (seven) days. Take with a full glass of water on an empty stomach. 11/07/15   Richard Maceo Pro., MD  ALPRAZolam Duanne Moron) 0.5 MG tablet 1/2 tablet twice daily as needed 07/02/15   Jerrol Banana., MD  amLODipine-olmesartan (AZOR) 10-40 MG tablet Take by mouth. 08/08/12   Historical Provider, MD  Ascorbic Acid (VITAMIN C) 100 MG tablet Take 100 mg by mouth daily.      Historical Provider, MD  aspirin 81 MG tablet Take by mouth. 11/26/10   Historical Provider, MD  busPIRone (BUSPAR) 5 MG tablet 1 Tablet, Oral, two times daily 08/11/15   Jerrol Banana., MD  Calcium-Magnesium-Vitamin D (CALCIUM MAGNESIUM PO) Take by mouth.    Historical Provider, MD  fish oil-omega-3 fatty acids 1000 MG capsule Take 2 g by mouth daily.      Historical Provider, MD  fluticasone (FLONASE) 50 MCG/ACT nasal spray Place into the nose. 11/26/10   Historical Provider, MD  Glucosamine-Chondroit-Vit C-Mn (GLUCOSAMINE CHONDR 1500 COMPLX PO) Take 1 capsule by mouth daily.      Historical Provider, MD  ibuprofen (ADVIL,MOTRIN) 200 MG tablet Take 200 mg by mouth every 6 (six) hours as needed.      Historical Provider, MD  loratadine (CLARITIN) 10 MG tablet Take by mouth. Reported on 10/14/2015 07/18/13   Historical Provider, MD  Magnesium Oxide -Mg Supplement 250 MG TABS Take 1 tablet by mouth daily.      Historical Provider, MD  metoprolol succinate (TOPROL-XL) 25 MG 24 hr tablet Take 1 tablet (25 mg total) by mouth daily.  07/02/15   Richard Maceo Pro., MD  Multiple Vitamin (MULTIVITAMIN) tablet Take 1 tablet by mouth daily.      Historical Provider, MD  omeprazole (PRILOSEC) 20 MG capsule Take 20 mg by mouth daily.      Historical Provider, MD  Psyllium-Calcium (METAMUCIL PLUS CALCIUM PO) Take 1 tablet by mouth daily.      Historical Provider, MD    Patient Active Problem List   Diagnosis Date Noted  . Breast cancer of upper-outer quadrant of left female breast ([place]) 12/06/2015  . Special screening for malignant neoplasms, colon   . Personal history of tobacco use, presenting hazards to health 10/31/2015  . Anxiety 05/23/2015  . CAFL (chronic airflow limitation) ([place]) 05/23/2015  . Bloodgood disease 05/23/2015  . Essential (primary) hypertension 05/23/2015  . Acid reflux 05/23/2015  . Hypercholesteremia 05/23/2015  . Below normal amount of sodium in the blood 05/23/2015  . Osteopenia 05/23/2015  . Allergic rhinitis, seasonal 05/23/2015  . HYPERTENSION, BENIGN 12/26/2008  . ACUTE IDIOPATHIC PERICARDITIS 11/21/2008  . UNSPECIFIED ANEMIA 10/03/2008    Past Medical History:  Diagnosis Date  . Anemia   . Anxiety disorder   . Cough    lingering, mild, finished Prednisone and anitbiotic 11/03/15  . Family history of adverse reaction to anesthesia    sister - PONV  . H/O: hysterectomy   .  Hypertension   . Osteopenia   . Osteoporosis   . Pericarditis    diagnonsed June, 2010, unclear etiology as of yer  . Personal history of tobacco use, presenting hazards to health 10/31/2015  . UTI (lower urinary tract infection)   . Wears dentures    full upper    Social History   Social History  . Marital status: Married    Spouse name: N/A  . Number of children: 2  . Years of education: N/A   Occupational History  . Not on file.   Social History Main Topics  . Smoking status: Former Smoker    Packs/day: 0.75    Years: 40.00    Quit date: 11/07/2003  . Smokeless tobacco: Never Used  . Alcohol use  4.2 oz/week    7 Glasses of wine per week     Comment: 1-3 a day  . Drug use: No  . Sexual activity: Not on file   Other Topics Concern  . Not on file   Social History Narrative   She drinks 1 to 2 caffeinated beverages a day    Allergies  Allergen Reactions  . Diuretic  [Buchu-Cornsilk-Ch Grass-Hydran]     hyponatremia  . Furosemide     hives  . Other     SSRI---hyponatremia  . Pimenta Nausea And Vomiting    Review of Systems  Constitutional: Negative.   HENT: Negative.   Eyes: Negative.   Respiratory: Negative.   Cardiovascular: Negative.   Gastrointestinal: Negative.   Genitourinary: Negative.   Musculoskeletal: Negative.   Skin: Negative.   Neurological: Negative.   Endo/Heme/Allergies: Negative.   Psychiatric/Behavioral: Negative.     Immunization History  Administered Date(s) Administered  . Influenza, High Dose Seasonal PF 02/18/2015  . Pneumococcal Conjugate-13 09/25/2014  . Pneumococcal Polysaccharide-23 04/03/2010  . Zoster 04/09/2011   Objective:  BP 108/64 (BP Location: Left Arm, Patient Position: Sitting, Cuff Size: Normal)   Pulse 68   Temp 97.8 F (36.6 C) (Oral)   Resp 16   Wt 148 lb (67.1 kg)   BMI 27.96 kg/m   Physical Exam  Constitutional: She is oriented to person, place, and time and well-developed, well-nourished, and in no distress.  Neck: Normal range of motion. Neck supple.  Cardiovascular: Normal rate, regular rhythm, normal heart sounds and intact distal pulses.   Pulmonary/Chest: Effort normal and breath sounds normal.  Abdominal: Soft. Bowel sounds are normal.  Musculoskeletal: Normal range of motion.  Neurological: She is alert and oriented to person, place, and time. She has normal reflexes. Gait normal. GCS score is 15.  Skin: Skin is warm and dry.  Psychiatric: Mood, memory, affect and judgment normal.    Lab Results  Component Value Date   WBC 6.9 12/10/2015   HGB 11.3 (L) 12/10/2015   HCT 32.0 (L) 12/10/2015    PLT 259 12/10/2015   GLUCOSE 95 12/10/2015   CHOL 208 (H) 10/11/2015   TRIG 64 10/11/2015   HDL 72 10/11/2015   LDLCALC 123 (H) 10/11/2015   TSH 0.954 10/11/2015   INR 0.9 07/29/2012    CMP     Component Value Date/Time   NA 126 (L) 12/10/2015 1501   NA 124 (L) 10/11/2015 0832   NA 125 (L) 07/11/2013 1115   K 4.5 12/10/2015 1501   K 4.5 07/11/2013 1115   CL 95 (L) 12/10/2015 1501   CL 90 (L) 07/11/2013 1115   CO2 24 12/10/2015 1501   CO2 25 07/11/2013 1115  GLUCOSE 95 12/10/2015 1501   GLUCOSE 115 (H) 07/11/2013 1115   BUN 18 12/10/2015 1501   BUN 15 10/11/2015 0832   BUN 15 07/11/2013 1115   CREATININE 0.82 12/10/2015 1501   CREATININE 1.06 07/11/2013 1115   CALCIUM 9.0 12/10/2015 1501   CALCIUM 9.2 07/11/2013 1115   PROT 8.0 12/10/2015 1501   PROT 8.1 10/11/2015 0832   PROT 8.2 07/11/2013 1115   ALBUMIN 4.3 12/10/2015 1501   ALBUMIN 4.5 10/11/2015 0832   ALBUMIN 3.8 07/11/2013 1115   AST 22 12/10/2015 1501   AST 23 07/11/2013 1115   ALT 16 12/10/2015 1501   ALT 22 07/11/2013 1115   ALKPHOS 59 12/10/2015 1501   ALKPHOS 72 07/11/2013 1115   BILITOT 0.4 12/10/2015 1501   BILITOT 0.4 10/11/2015 0832   BILITOT 0.3 07/11/2013 1115   GFRNONAA >60 12/10/2015 1501   GFRNONAA 54 (L) 07/11/2013 1115   GFRAA >60 12/10/2015 1501   GFRAA >60 07/11/2013 1115    Assessment and Plan :  1. Hyponatremia Discussed with pt and need for work up. Family gives patient has a history of alcohol abuse. She evidently drinks now 1-3 drinks per day. She evidently also drinks a lot of water. After reviewing the chart and the refer to nephrology for evaluation of the hyponatremia. - Ambulatory referral to Nephrology  2. Scleroderma ([place]) She has history of scleroderma and has some very mild skin lesions on her abdomen but otherwise this is been quiesced and had not worked up for several years. 3. Breast cancer She is seen in oncology and she sees a Psychologist, sport and exercise in an hour. 4. Mild  cognitive impairment   Patient was seen and examined by Dr. Miguel Aschoff, and noted scribed by Webb Laws, [place] MD [facility] 12/12/2015 9:45 AM

## 2015-12-12 NOTE — Telephone Encounter (Signed)
Pt returned your phone call. Renaldo Fiddler, CMA

## 2015-12-12 NOTE — Patient Instructions (Signed)
The patient is aware to call back for any questions or concerns.  

## 2015-12-12 NOTE — H&P (Addendum)
Patient ID: Bianca Shaw, female   DOB: 1946-02-27, 70 y.o.   MRN: [id number]  Chief Complaint  Patient presents with  . Breast Problem    HPI Bianca Shaw is a 70 y.o. female.  who presents for a breast evaluation. The most recent mammogram was done on 7-06-04-15 . She also had a left breast ultrasound and biopsy on 12-03-15 showing lobular carcinoma. Patient was seen at the cancer center by Dr. Mike Gip on 12/10/15. Patient does perform regular self breast checks and gets regular mammograms done. Husband [name] present at visit.    HPI  Past Medical History:  Diagnosis Date  . Anemia   . Anxiety disorder   . Cancer ([place]) 12/03/2015   left breast/ INVASIVE LOBULAR CARCINOMA.   . Cough    lingering, mild, finished Prednisone and anitbiotic 11/03/15  . Family history of adverse reaction to anesthesia    sister - PONV  . H/O: hysterectomy   . Hypertension   . Osteopenia   . Osteoporosis   . Pericarditis    diagnonsed June, 2010, unclear etiology as of yer  . Personal history of tobacco use, presenting hazards to health 10/31/2015  . UTI (lower urinary tract infection)   . Wears dentures    full upper    Past Surgical History:  Procedure Laterality Date  . BREAST BIOPSY Right 2012   core - neg  . BREAST BIOPSY Left 12/03/2015   INVASIVE LOBULAR CARCINOMA.   Marland Kitchen CATARACT EXTRACTION W/ INTRAOCULAR LENS IMPLANT Right   . COLONOSCOPY WITH PROPOFOL N/A 11/08/2015   Procedure: COLONOSCOPY WITH PROPOFOL;  Surgeon: Lucilla Lame, MD;  Location: [facility];  Service: Endoscopy;  Laterality: N/A;  . TUBAL LIGATION    . VESICOVAGINAL FISTULA CLOSURE W/ TAH      Family History  Problem Relation Age of Onset  . Heart failure Mother   . Epilepsy Mother   . COPD Father   . Heart disease Father   . Anxiety disorder Sister   . Arthritis Brother   . Heart disease Brother   . Vaginal cancer Paternal Grandmother   . Heart attack Paternal Grandfather   . COPD Brother   .  Kidney failure Brother   . COPD Brother   . Arthritis Sister   . Uterine cancer    . Diabetes    . Colon cancer Neg Hx   . Stomach cancer Neg Hx   . Breast cancer Neg Hx     Social History Social History  Substance Use Topics  . Smoking status: Former Smoker    Packs/day: 0.75    Years: 40.00    Quit date: 11/07/2003  . Smokeless tobacco: Never Used  . Alcohol use 4.2 oz/week    7 Glasses of wine per week     Comment: 1-3 a day    Allergies  Allergen Reactions  . Diuretic  [Buchu-Cornsilk-Ch Grass-Hydran]     hyponatremia  . Furosemide     hives  . Other     SSRI---hyponatremia  . Pimenta Nausea And Vomiting    Current Outpatient Prescriptions  Medication Sig Dispense Refill  . albuterol (PROVENTIL) (2.5 MG/3ML) 0.083% nebulizer solution Take 3 mLs (2.5 mg total) by nebulization every 6 (six) hours as needed for wheezing or shortness of breath. 75 mL 12  . alendronate (FOSAMAX) 70 MG tablet Take 1 tablet (70 mg total) by mouth every 7 (seven) days. Take with a full glass of water on an empty stomach. 4  tablet 11  . ALPRAZolam (XANAX) 0.5 MG tablet 1/2 tablet twice daily as needed 60 tablet 3  . amLODipine-olmesartan (AZOR) 10-40 MG tablet Take by mouth.    . Ascorbic Acid (VITAMIN C) 100 MG tablet Take 100 mg by mouth daily.      Marland Kitchen aspirin 81 MG tablet Take by mouth.    . busPIRone (BUSPAR) 5 MG tablet 1 Tablet, Oral, two times daily 60 tablet 2  . Calcium-Magnesium-Vitamin D (CALCIUM MAGNESIUM PO) Take by mouth.    . fish oil-omega-3 fatty acids 1000 MG capsule Take 2 g by mouth daily.      . fluticasone (FLONASE) 50 MCG/ACT nasal spray Place into the nose.    . Glucosamine-Chondroit-Vit C-Mn (GLUCOSAMINE CHONDR 1500 COMPLX PO) Take 1 capsule by mouth daily.      Marland Kitchen ibuprofen (ADVIL,MOTRIN) 200 MG tablet Take 200 mg by mouth every 6 (six) hours as needed.      . loratadine (CLARITIN) 10 MG tablet Take by mouth. Reported on 10/14/2015    . Magnesium Oxide -Mg Supplement  250 MG TABS Take 1 tablet by mouth daily.      . metoprolol succinate (TOPROL-XL) 25 MG 24 hr tablet Take 1 tablet (25 mg total) by mouth daily. 30 tablet 3  . Multiple Vitamin (MULTIVITAMIN) tablet Take 1 tablet by mouth daily.      Marland Kitchen omeprazole (PRILOSEC) 20 MG capsule Take 20 mg by mouth daily.      . Psyllium-Calcium (METAMUCIL PLUS CALCIUM PO) Take 1 tablet by mouth daily.       No current facility-administered medications for this visit.     Review of Systems Review of Systems  Constitutional: Negative.   Respiratory: Negative.   Cardiovascular: Negative.   Gastrointestinal:       Patient denies dysphasia.    Blood pressure 130/76, pulse 80, resp. rate 14, height '5\' 1"'  (1.549 m), weight 147 lb (66.7 kg).  Physical Exam Physical Exam  Constitutional: She is oriented to person, place, and time. She appears well-developed and well-nourished.  Eyes: Conjunctivae are normal. No scleral icterus.  Neck: Neck supple.  Cardiovascular: Normal rate and regular rhythm.   Murmur heard.  Systolic murmur is present with a grade of 2/6  Pulmonary/Chest: Effort normal and breath sounds normal. Right breast exhibits no inverted nipple, no mass, no nipple discharge, no skin change and no tenderness. Left breast exhibits no inverted nipple, no nipple discharge, no skin change and no tenderness.    Left breast shows a palpable mass in the upper-outer quadrant with moderate bruising.  Abdominal: Soft. Bowel sounds are normal. There is no tenderness.  Lymphadenopathy:    She has no cervical adenopathy.    She has no axillary adenopathy.  Neurological: She is alert and oriented to person, place, and time.  Skin: Skin is warm and dry.    Data Reviewed 2015-2017 mammograms and current ultrasound images were reviewed.  4 cm mass in the upper-outer quadrant for which core biopsy was completed on 12/03/2015.(The area I thought might present a postbiopsy hematoma is instead the mass recently  biopsy).  Invasive lobular carcinoma, histologic grade 2. Year positive, 90%; PR +90%, HER-2/neu by immunohistochemistry was equivocal. Reflex to FISH pending.  February 2016 right breast biopsies for microcalcification showed benign breast tissue with fibroadenomatous changes in coarse calcifications.  Consultation note from Nolon Stalls, M.D. of 12/10/2015 reviewed.  Laboratory studies of the same date showed a baseline stable modest normochromic anemia with an MCV of  98 and hemoglobin of 87.5. , Some metabolic panel is notable only for modest hyponatremia with a sodium of 126, unchanged over the last 2 years. Renal function is normal.  CA 27-29 normal at 27.3.    Assessment    Left breast mass with biopsy showing invasive lobular carcinoma. Stage II based on ultrasound size.  Scleroderma.    Plan  With the invasive lobular histology, the patient would be a candidate for neoadjuvant hormonal therapy if she was strongly interested in breast conservation. Based on review of her past mammograms, I think there is modest at best likelihood of significant reduction. With the 4 cm size, wide excision followed by radiation will produce a significant breast deformity, but could be attempted if desired. With her history of scleroderma based bar suspect that there is probably less interest in skin sparing mastectomy and immediate reconstruction, again with the potential for poor skin healing.  The majority of the visit was spent reviewing the options for breast cancer treatment. Breast conservation with lumpectomy and radiation therapy  was presented as equivalent to mastectomy for long-term control. The pros and cons of each treatment regimen were reviewed. Incision during her history of scleroderma, there would be a concern for possible increased radiation change or residual breast or healing issues as the patch of scleroderma on the left breast is encroaching on where wide excision would be  undertaken. The opportunity for plastic surgery consultation was reviewed.  The indications for breast MRI to assess for secondary lesions in the left breast if she is considering breast conservation as well as contralateral lesions in the right breast considering the lobular histology was reviewed.  The patient is aware to use a heating pad as needed for comfort and her present biopsy site  Options for second surgical opinion were reviewed.  We spent approximately an hour reviewing surgical options.    This information has been scribed by Karie Fetch RN, BSN,BC.  Robert Bellow 12/13/2015, 8:14 PM

## 2015-12-13 LAB — SURGICAL PATHOLOGY

## 2015-12-13 NOTE — Progress Notes (Signed)
  Oncology Nurse Navigator Documentation  Navigator Location: [facility] (12/13/15 1100) Navigator Encounter Type: Telephone (12/13/15 1100) Telephone: Incoming Call;Outgoing Call;Education (12/13/15 1100)         Patient Visit Type: Follow-up (12/13/15 1100)                              Time Spent with Patient: 30 (12/13/15 1100)  Patient left message stating appt. With Dr. Bary Castilla went well.  States she has notified office of surgical preference, and is waiting for confirmation.  Phoned patient.  No answer.  Left message at patient's request for assistance with [facility] cancer policy.  Checked with Lula Olszewski CSW, and she states she will help with this paperwork.

## 2015-12-13 NOTE — Progress Notes (Signed)
Met patient, and husband at initial [facility] visit.  Introduced IT trainer.  Breast Cancer Treatment Handbook/folder with hospital services given to patient.   Patient requests phone call after her appointment with Dr. Bary Castilla which is scheduled for 12/12/15.

## 2015-12-14 ENCOUNTER — Telehealth: Payer: Self-pay | Admitting: General Surgery

## 2015-12-14 NOTE — Telephone Encounter (Signed)
Reviewed her plans for left mastectomy. In light of tumor size and histology, this is reasonable. Reviewed indications for breast MRI to assess the contralateral breast. She is amenable.

## 2015-12-16 ENCOUNTER — Other Ambulatory Visit: Payer: Self-pay

## 2015-12-16 DIAGNOSIS — C50912 Malignant neoplasm of unspecified site of left female breast: Secondary | ICD-10-CM

## 2015-12-17 ENCOUNTER — Other Ambulatory Visit: Payer: Self-pay | Admitting: General Surgery

## 2015-12-17 ENCOUNTER — Telehealth: Payer: Self-pay

## 2015-12-17 DIAGNOSIS — C50412 Malignant neoplasm of upper-outer quadrant of left female breast: Secondary | ICD-10-CM

## 2015-12-17 NOTE — Telephone Encounter (Signed)
The patient will report to radiology desk on 12/26/15 at 8:15 am for her SLN injection. The patient is aware of date and instructions.

## 2015-12-17 NOTE — Telephone Encounter (Signed)
-----   Message from Robert Bellow, MD sent at 12/13/2015  8:26 PM EDT ----- Please arrange for bilateral breast MRI with diagnosis of invasive lobular carcinoma.

## 2015-12-17 NOTE — Telephone Encounter (Signed)
The patient is scheduled for a Breast MRI for 12/24/15 at 4:40 pm at [facility]. She is aware of date, time and instructions. She is also scheduled for surgery at [facility] on 12/26/15. She will pre admit by phone. I will call her with her arrival time and location. The patient is aware of date and instructions.

## 2015-12-19 ENCOUNTER — Ambulatory Visit: Payer: PPO | Admitting: Radiation Oncology

## 2015-12-23 ENCOUNTER — Inpatient Hospital Stay: Admission: RE | Admit: 2015-12-23 | Payer: PPO | Source: Ambulatory Visit

## 2015-12-24 ENCOUNTER — Ambulatory Visit
Admission: RE | Admit: 2015-12-24 | Discharge: 2015-12-24 | Disposition: A | Discharge status: Home / Self Care | Payer: PPO | Source: Ambulatory Visit | Attending: General Surgery | Admitting: General Surgery

## 2015-12-24 DIAGNOSIS — C50912 Malignant neoplasm of unspecified site of left female breast: Secondary | ICD-10-CM

## 2015-12-24 DIAGNOSIS — D0502 Lobular carcinoma in situ of left breast: Secondary | ICD-10-CM | POA: Diagnosis not present

## 2015-12-24 MED ORDER — GADOBENATE DIMEGLUMINE 529 MG/ML IV SOLN: 13.0000 mL | Freq: Once | INTRAVENOUS | Status: DC | PRN

## 2015-12-25 ENCOUNTER — Encounter
Admission: RE | Admit: 2015-12-25 | Discharge: 2015-12-25 | Disposition: A | Discharge status: Home / Self Care | Payer: PPO | Source: Ambulatory Visit | Attending: General Surgery | Admitting: General Surgery

## 2015-12-25 ENCOUNTER — Encounter: Payer: Self-pay | Admitting: *Deleted

## 2015-12-25 ENCOUNTER — Encounter: Payer: Self-pay | Admitting: Pathology

## 2015-12-25 DIAGNOSIS — J449 Chronic obstructive pulmonary disease, unspecified: Secondary | ICD-10-CM | POA: Diagnosis not present

## 2015-12-25 DIAGNOSIS — Z87891 Personal history of nicotine dependence: Secondary | ICD-10-CM | POA: Diagnosis not present

## 2015-12-25 DIAGNOSIS — K219 Gastro-esophageal reflux disease without esophagitis: Secondary | ICD-10-CM | POA: Diagnosis not present

## 2015-12-25 DIAGNOSIS — C50412 Malignant neoplasm of upper-outer quadrant of left female breast: Secondary | ICD-10-CM | POA: Diagnosis not present

## 2015-12-25 DIAGNOSIS — I10 Essential (primary) hypertension: Secondary | ICD-10-CM | POA: Diagnosis not present

## 2015-12-25 LAB — SODIUM: SODIUM: 130 mmol/L — AB (ref 135–145)

## 2015-12-25 NOTE — Patient Instructions (Signed)
  Your procedure is scheduled on: 12-26-15 (THURSDAY) Report to [place]  (2ND DESK ON RIGHT) @ 8:00 AM   Remember: Instructions that are not followed completely may result in serious medical risk, up to and including death, or upon the discretion of your surgeon and anesthesiologist your surgery may need to be rescheduled.    _x___ 1. Do not eat food or drink liquids after midnight. No gum chewing or hard candies.     __x__ 2. No Alcohol for 24 hours before or after surgery.   __x__3. No Smoking for 24 prior to surgery.   ____  4. Bring all medications with you on the day of surgery if instructed.    __x__ 5. Notify your doctor if there is any change in your medical condition     (cold, fever, infections).     Do not wear jewelry, make-up, hairpins, clips or nail polish.  Do not wear lotions, powders, or perfumes. You may wear deodorant.  Do not shave 48 hours prior to surgery. Men may shave face and neck.  Do not bring valuables to the hospital.    [facility] is not responsible for any belongings or valuables.               Contacts, dentures or bridgework may not be worn into surgery.  Leave your suitcase in the car. After surgery it may be brought to your room.  For patients admitted to the hospital, discharge time is determined by your treatment team.   Patients discharged the day of surgery will not be allowed to drive home.    Please read over the following fact sheets that you were given:   [facility] Preparing for Surgery and or MRSA Information   _x___ Take these medicines the morning of surgery with A SIP OF WATER:    1. XANAX (ALPRAZOLAM)  2. AZOR (AMLODIPINE-OLMESARTAN)  3. METOPROLOL  4. MAGNESIUM OXIDE  5. PRILOSEC (OMEPRAZOLE)  6. TAKE A PRILOSEC TONIGHT BEFORE BED  ____ Fleet Enema (as directed)   _x___ Use CHG Soap or sage wipes as directed on instruction sheet   _X___ Use inhalers on the day of surgery and bring to hospital day of  surgery-BRING ALBUTEROL [place]  ____ Stop metformin 2 days prior to surgery    ____ Take 1/2 of usual insulin dose the night before surgery and none on the morning of  surgery.   ____ Stop aspirin or coumadin, or plavix-PT LAST TOOK ASPIRIN LAST WEEK  _x__ Stop Anti-inflammatories such as Advil, Aleve, Ibuprofen, Motrin, Naproxen,          Naprosyn, Goodies powders or aspirin products. Ok to take Tylenol.   ____ Stop supplements until after surgery.    ____ Bring C-Pap to the hospital.

## 2015-12-26 ENCOUNTER — Ambulatory Visit: Payer: PPO | Admitting: Registered Nurse

## 2015-12-26 ENCOUNTER — Encounter
Admission: RE | Disposition: A | Discharge status: Home / Self Care | Payer: Self-pay | Source: Ambulatory Visit | Attending: General Surgery

## 2015-12-26 ENCOUNTER — Ambulatory Visit
Admission: RE | Admit: 2015-12-26 | Discharge: 2015-12-26 | Disposition: A | Discharge status: Home / Self Care | Payer: PPO | Source: Ambulatory Visit | Attending: General Surgery | Admitting: General Surgery

## 2015-12-26 ENCOUNTER — Encounter: Payer: Self-pay | Admitting: *Deleted

## 2015-12-26 ENCOUNTER — Encounter
Admission: RE | Admit: 2015-12-26 | Discharge: 2015-12-26 | Disposition: A | Discharge status: Home / Self Care | Payer: PPO | Source: Ambulatory Visit | Attending: General Surgery | Admitting: General Surgery

## 2015-12-26 DIAGNOSIS — C50812 Malignant neoplasm of overlapping sites of left female breast: Secondary | ICD-10-CM | POA: Diagnosis not present

## 2015-12-26 DIAGNOSIS — C50912 Malignant neoplasm of unspecified site of left female breast: Secondary | ICD-10-CM

## 2015-12-26 DIAGNOSIS — K219 Gastro-esophageal reflux disease without esophagitis: Secondary | ICD-10-CM | POA: Insufficient documentation

## 2015-12-26 DIAGNOSIS — C50412 Malignant neoplasm of upper-outer quadrant of left female breast: Secondary | ICD-10-CM | POA: Insufficient documentation

## 2015-12-26 DIAGNOSIS — Z87891 Personal history of nicotine dependence: Secondary | ICD-10-CM | POA: Insufficient documentation

## 2015-12-26 DIAGNOSIS — I10 Essential (primary) hypertension: Secondary | ICD-10-CM | POA: Insufficient documentation

## 2015-12-26 DIAGNOSIS — J449 Chronic obstructive pulmonary disease, unspecified: Secondary | ICD-10-CM | POA: Insufficient documentation

## 2015-12-26 HISTORY — DX: Systemic sclerosis, unspecified: M34.9

## 2015-12-26 HISTORY — DX: Gastro-esophageal reflux disease without esophagitis: K21.9

## 2015-12-26 HISTORY — PX: MASTECTOMY W/ SENTINEL NODE BIOPSY: SHX2001

## 2015-12-26 SURGERY — MASTECTOMY WITH SENTINEL LYMPH NODE BIOPSY
Anesthesia: General | Laterality: Left

## 2015-12-26 MED ORDER — FENTANYL CITRATE (PF) 100 MCG/2ML IJ SOLN
INTRAMUSCULAR | Status: AC
  Administered 2015-12-26: 25 ug via INTRAVENOUS
  Filled 2015-12-26: qty 2

## 2015-12-26 MED ORDER — LACTATED RINGERS IV SOLN
INTRAVENOUS | Status: DC
  Administered 2015-12-26: 09:00:00 via INTRAVENOUS

## 2015-12-26 MED ORDER — KETOROLAC TROMETHAMINE 30 MG/ML IJ SOLN
INTRAMUSCULAR | Status: DC | PRN
  Administered 2015-12-26: 30 mg via INTRAVENOUS

## 2015-12-26 MED ORDER — HYDROCODONE-ACETAMINOPHEN 5-325 MG PO TABS: 1.0000 | ORAL_TABLET | ORAL | 0 refills | Status: DC | PRN

## 2015-12-26 MED ORDER — BUSPIRONE HCL 5 MG PO TABS: ORAL_TABLET | ORAL | 2 refills | Status: DC

## 2015-12-26 MED ORDER — METHYLENE BLUE 0.5 % INJ SOLN
INTRAVENOUS | Status: AC
  Filled 2015-12-26: qty 10

## 2015-12-26 MED ORDER — PROPOFOL 10 MG/ML IV BOLUS
INTRAVENOUS | Status: DC | PRN
  Administered 2015-12-26: 50 mg via INTRAVENOUS
  Administered 2015-12-26: 150 mg via INTRAVENOUS

## 2015-12-26 MED ORDER — TECHNETIUM TC 99M SULFUR COLLOID FILTERED
0.9490 | Freq: Once | INTRAVENOUS | Status: AC | PRN
  Administered 2015-12-26: 0.949 via INTRADERMAL

## 2015-12-26 MED ORDER — ONDANSETRON HCL 4 MG/2ML IJ SOLN: 4.0000 mg | Freq: Once | INTRAMUSCULAR | Status: DC | PRN

## 2015-12-26 MED ORDER — ACETAMINOPHEN 10 MG/ML IV SOLN
INTRAVENOUS | Status: DC | PRN
  Administered 2015-12-26: 1000 mg via INTRAVENOUS

## 2015-12-26 MED ORDER — TECHNETIUM TC 99M SULFUR COLLOID: 1.0000 | Freq: Once | INTRAVENOUS | Status: DC | PRN

## 2015-12-26 MED ORDER — DEXAMETHASONE SODIUM PHOSPHATE 10 MG/ML IJ SOLN
INTRAMUSCULAR | Status: DC | PRN
  Administered 2015-12-26: 5 mg via INTRAVENOUS

## 2015-12-26 MED ORDER — CEFAZOLIN SODIUM-DEXTROSE 2-4 GM/100ML-% IV SOLN
INTRAVENOUS | Status: AC
  Administered 2015-12-26: 2 g via INTRAVENOUS
  Filled 2015-12-26: qty 100

## 2015-12-26 MED ORDER — HYDROCODONE-ACETAMINOPHEN 5-325 MG PO TABS
ORAL_TABLET | ORAL | Status: DC
Start: 2015-12-26 — End: 2015-12-26
  Filled 2015-12-26: qty 1

## 2015-12-26 MED ORDER — LIDOCAINE HCL (CARDIAC) 20 MG/ML IV SOLN
INTRAVENOUS | Status: DC | PRN
  Administered 2015-12-26: 80 mg via INTRAVENOUS

## 2015-12-26 MED ORDER — ALPRAZOLAM 0.5 MG PO TABS: 0.5000 mg | ORAL_TABLET | ORAL | Status: DC

## 2015-12-26 MED ORDER — FENTANYL CITRATE (PF) 100 MCG/2ML IJ SOLN
25.0000 ug | INTRAMUSCULAR | Status: DC | PRN
  Administered 2015-12-26 (×4): 25 ug via INTRAVENOUS

## 2015-12-26 MED ORDER — CEFAZOLIN SODIUM-DEXTROSE 2-4 GM/100ML-% IV SOLN
2.0000 g | INTRAVENOUS | Status: AC
  Administered 2015-12-26: 2 g via INTRAVENOUS

## 2015-12-26 MED ORDER — MIDAZOLAM HCL 2 MG/2ML IJ SOLN
INTRAMUSCULAR | Status: DC | PRN
  Administered 2015-12-26: 2 mg via INTRAVENOUS

## 2015-12-26 MED ORDER — FENTANYL CITRATE (PF) 100 MCG/2ML IJ SOLN
INTRAMUSCULAR | Status: DC | PRN
  Administered 2015-12-26 (×2): 50 ug via INTRAVENOUS

## 2015-12-26 MED ORDER — ONDANSETRON HCL 4 MG/2ML IJ SOLN
INTRAMUSCULAR | Status: DC | PRN
  Administered 2015-12-26: 4 mg via INTRAVENOUS

## 2015-12-26 MED ORDER — ACETAMINOPHEN 10 MG/ML IV SOLN
INTRAVENOUS | Status: AC
  Filled 2015-12-26: qty 100

## 2015-12-26 MED ORDER — GLYCOPYRROLATE 0.2 MG/ML IJ SOLN
INTRAMUSCULAR | Status: DC | PRN
  Administered 2015-12-26: 0.2 mg via INTRAVENOUS

## 2015-12-26 MED ORDER — HYDROCODONE-ACETAMINOPHEN 5-325 MG PO TABS
1.0000 | ORAL_TABLET | ORAL | Status: DC | PRN
  Administered 2015-12-26: 1 via ORAL

## 2015-12-26 MED ORDER — METHYLENE BLUE 0.5 % INJ SOLN
INTRAVENOUS | Status: DC | PRN
  Administered 2015-12-26: 5 mL via SUBMUCOSAL

## 2015-12-26 SURGICAL SUPPLY — 57 items
APL SKNCLS STERI-STRIP NONHPOA (GAUZE/BANDAGES/DRESSINGS) ×1
APPLIER CLIP 11 MED OPEN (CLIP)
APPLIER CLIP 13 LRG OPEN (CLIP)
APR CLP LRG 13 20 CLIP (CLIP)
APR CLP MED 11 20 MLT OPN (CLIP)
BANDAGE ELASTIC 6 LF NS (GAUZE/BANDAGES/DRESSINGS) ×1 IMPLANT
BENZOIN TINCTURE PRP APPL 2/3 (GAUZE/BANDAGES/DRESSINGS) ×2 IMPLANT
BLADE SURG 15 STRL SS SAFETY (BLADE) ×1 IMPLANT
BNDG CMPR MED 5X6 ELC HKLP NS (GAUZE/BANDAGES/DRESSINGS)
BNDG GAUZE 4.5X4.1 6PLY STRL (MISCELLANEOUS) ×1 IMPLANT
BULB RESERV EVAC DRAIN JP 100C (MISCELLANEOUS) ×2 IMPLANT
CANISTER SUCT 1200ML W/VALVE (MISCELLANEOUS) ×3 IMPLANT
CHLORAPREP W/TINT 26ML (MISCELLANEOUS) ×3 IMPLANT
CLIP APPLIE 11 MED OPEN (CLIP) IMPLANT
CLIP APPLIE 13 LRG OPEN (CLIP) IMPLANT
CLOSURE WOUND 1/2 X4 (GAUZE/BANDAGES/DRESSINGS) ×1
CNTNR SPEC 2.5X3XGRAD LEK (MISCELLANEOUS)
CONT SPEC 4OZ STER OR WHT (MISCELLANEOUS)
CONT SPEC 4OZ STRL OR WHT (MISCELLANEOUS)
CONTAINER SPEC 2.5X3XGRAD LEK (MISCELLANEOUS) ×3 IMPLANT
DEVICE DISSECT PLASMABLAD 3.0S (MISCELLANEOUS) IMPLANT
DEVICE DUBIN SPECIMEN MAMMOGRA (MISCELLANEOUS) ×3 IMPLANT
DRAIN CHANNEL JP 15F RND 16 (MISCELLANEOUS) ×2 IMPLANT
DRAPE LAPAROTOMY TRNSV 106X77 (MISCELLANEOUS) ×3 IMPLANT
DRESSING TELFA 4X3 1S ST N-ADH (GAUZE/BANDAGES/DRESSINGS) ×2 IMPLANT
DRSG TELFA 3X8 NADH (GAUZE/BANDAGES/DRESSINGS) ×3 IMPLANT
ELECT CAUTERY BLADE TIP 2.5 (TIP)
ELECT REM PT RETURN 9FT ADLT (ELECTROSURGICAL) ×3
ELECTRODE CAUTERY BLDE TIP 2.5 (TIP) ×1 IMPLANT
ELECTRODE REM PT RTRN 9FT ADLT (ELECTROSURGICAL) ×1 IMPLANT
GAUZE FLUFF 18X24 1PLY STRL (GAUZE/BANDAGES/DRESSINGS) ×3 IMPLANT
GLOVE BIO SURGEON STRL SZ7.5 (GLOVE) ×3 IMPLANT
GLOVE INDICATOR 8.0 STRL GRN (GLOVE) ×3 IMPLANT
GOWN STRL REUS W/ TWL LRG LVL3 (GOWN DISPOSABLE) ×2 IMPLANT
GOWN STRL REUS W/TWL LRG LVL3 (GOWN DISPOSABLE) ×6
LABEL OR SOLS (LABEL) ×1 IMPLANT
PACK BASIN MINOR ARMC (MISCELLANEOUS) ×3 IMPLANT
PAD DRESSING TELFA 3X8 NADH (GAUZE/BANDAGES/DRESSINGS) ×1 IMPLANT
PIN SAFETY STRL (MISCELLANEOUS) ×1 IMPLANT
PLASMABLADE 3.0S (MISCELLANEOUS) ×3
SHEARS FOC LG CVD HARMONIC 17C (MISCELLANEOUS) IMPLANT
SLEVE PROBE SENORX GAMMA FIND (MISCELLANEOUS) ×5 IMPLANT
SPONGE LAP 18X18 5 PK (GAUZE/BANDAGES/DRESSINGS) ×3 IMPLANT
STRIP CLOSURE SKIN 1/2X4 (GAUZE/BANDAGES/DRESSINGS) ×3 IMPLANT
SUT ETHILON 3-0 FS-10 30 BLK (SUTURE) ×3
SUT SILK 0 (SUTURE) ×3
SUT SILK 0 30XBRD TIE 6 (SUTURE) ×1 IMPLANT
SUT SILK 3 0 (SUTURE)
SUT SILK 3-0 18XBRD TIE 12 (SUTURE) ×1 IMPLANT
SUT VIC AB 2-0 CT1 27 (SUTURE) ×6
SUT VIC AB 2-0 CT1 TAPERPNT 27 (SUTURE) ×4 IMPLANT
SUT VIC AB 3-0 SH 27 (SUTURE) ×3
SUT VIC AB 3-0 SH 27X BRD (SUTURE) ×1 IMPLANT
SUT VICRYL+ 3-0 144IN (SUTURE) ×3 IMPLANT
SUTURE EHLN 3-0 FS-10 30 BLK (SUTURE) ×1 IMPLANT
SWABSTK COMLB BENZOIN TINCTURE (MISCELLANEOUS) ×1 IMPLANT
TAPE TRANSPORE STRL 2 31045 (GAUZE/BANDAGES/DRESSINGS) ×1 IMPLANT

## 2015-12-26 NOTE — H&P (Signed)
No change in clinical history or exam.   For left mastectomy.

## 2015-12-26 NOTE — Anesthesia Preprocedure Evaluation (Signed)
Anesthesia Evaluation  Patient identified by MRN, date of birth, ID band Patient awake    Reviewed: Allergy & Precautions, NPO status , Patient's Chart, lab work & pertinent test results  History of Anesthesia Complications (+) Family history of anesthesia reaction and history of anesthetic complications (sister with PONV)  Airway Mallampati: II       Dental   Pulmonary COPD,  COPD inhaler, former smoker,           Cardiovascular hypertension, Pt. on medications and Pt. on home beta blockers      Neuro/Psych Anxiety negative neurological ROS     GI/Hepatic GERD  Medicated and Controlled,  Endo/Other    Renal/GU      Musculoskeletal   Abdominal   Peds  Hematology  (+) anemia ,   Anesthesia Other Findings   Reproductive/Obstetrics                             Anesthesia Physical Anesthesia Plan  ASA: II  Anesthesia Plan: General   Post-op Pain Management:    Induction: Intravenous  Airway Management Planned: LMA  Additional Equipment:   Intra-op Plan:   Post-operative Plan:   Informed Consent: I have reviewed the patients History and Physical, chart, labs and discussed the procedure including the risks, benefits and alternatives for the proposed anesthesia with the patient or authorized representative who has indicated his/her understanding and acceptance.     Plan Discussed with:   Anesthesia Plan Comments:         Anesthesia Quick Evaluation

## 2015-12-26 NOTE — Op Note (Signed)
Preoperative diagnosis: Invasive lobular carcinoma the left breast.  Postoperative diagnosis: Same.  Operative procedure: Left simple mastectomy with sentinel node biopsy.  Operating surgeon: Hervey Ard, M.D.  Anesthesia: Gen. by LMA.  Estimated blood loss: 10 mL.  Clinical note: This 70 year old woman was recently diagnosed with a large mass involving the upper outer quadrant of the left breast. Biopsy showed evidence of invasive lobular carcinoma. MRI imaging showed no contralateral disease or metastases. She underwent injection with technetium sulfur colloid prior to the procedure and is brought to the operative for planned mastectomy.  Operative note: With the patient under adequate general anesthesia and alcohol skin prep to the nipple 5 mL of 0.5% methylene blue was injected in the subareolar plexus. The breast axilla and chest was then prepped with ChloraPrep and draped. An elliptical incision was outlined to include resection of the nipple areolar complex. The skin was incised sharply and the remaining dissection completed making use of the plasma blade. Margins of resection was turned medially, clavicle superiorly, serratus muscle laterally and the rectus fascia inferiorly. The breast was elevated off the underlying pectoralis muscle taking the fascia with the specimen. 2 hot blue nodes were identified in the axilla and these were sent in formalin as sentinel nodes #1 & #2. An additional node that was neither hot nor blue was also identified and was moderately enlarged and this was sent as non-sentinel node #1.  The wound was irrigated with water and good hemostasis noted. The Blake drain was brought out to the inferior medial flap and I could in place with a 3-0 nylon suture. This was then passed up into the axilla. The flaps were approximated making use of a running 2-0 Vicryl suture in 2 segments. Benzoin, Steri-Strips, Telfa, fluffed gauze and a surgical bra was applied.  The  patient tolerated the procedure well was taken to recovery room stable condition.

## 2015-12-26 NOTE — Discharge Instructions (Signed)

## 2015-12-26 NOTE — Anesthesia Postprocedure Evaluation (Signed)
Anesthesia Post Note  Patient: Bianca Shaw  Procedure(s) Performed: Procedure(s) (LRB): MASTECTOMY WITH SENTINEL LYMPH NODE BIOPSY (Left)  Patient location during evaluation: PACU Anesthesia Type: General Level of consciousness: awake and alert Pain management: pain level controlled Vital Signs Assessment: post-procedure vital signs reviewed and stable Respiratory status: spontaneous breathing and respiratory function stable Cardiovascular status: stable Anesthetic complications: no    Last Vitals:  Vitals:   12/26/15 1145 12/26/15 1155  BP:  (!) 162/74  Pulse: (!) 59 (!) 59  Resp: 12 12  Temp:      Last Pain:  Vitals:   12/26/15 1155  TempSrc:   PainSc: 4                  KEPHART,WILLIAM K

## 2015-12-26 NOTE — Transfer of Care (Signed)
Immediate Anesthesia Transfer of Care Note  Patient: Bianca Shaw  Procedure(s) Performed: Procedure(s): MASTECTOMY WITH SENTINEL LYMPH NODE BIOPSY (Left)  Patient Location: PACU  Anesthesia Type:General  Level of Consciousness: sedated  Airway & Oxygen Therapy: Patient connected to face mask oxygen  Post-op Assessment: Post -op Vital signs reviewed and stable  Post vital signs: stable  Last Vitals:  Vitals:   12/26/15 0852 12/26/15 1125  BP: (!) 164/64 132/67  Pulse: 62 60  Resp: 16 13  Temp: 36.6 C 36.4 C    Last Pain:  Vitals:   12/26/15 1125  TempSrc: Temporal  PainSc:          Complications: No apparent anesthesia complications

## 2015-12-27 ENCOUNTER — Telehealth: Payer: Self-pay | Admitting: General Surgery

## 2015-12-27 NOTE — Telephone Encounter (Signed)
Called to check on patient post op mastectomy.  Bloody drainage around JP resolved with stripping drain. Surgical bra and fluff gauze damp. Family will remove, patient to wear her own bra and place enough padding (washclothes) into cup to apply pressure to surgery site. N/V after taking Norco on an empty stomach this AM. Better now. Will call if they want to come in for a dressing change.

## 2015-12-31 ENCOUNTER — Telehealth: Payer: Self-pay | Admitting: General Surgery

## 2015-12-31 ENCOUNTER — Ambulatory Visit (INDEPENDENT_AMBULATORY_CARE_PROVIDER_SITE_OTHER): Payer: PPO | Admitting: General Surgery

## 2015-12-31 ENCOUNTER — Encounter: Payer: Self-pay | Admitting: General Surgery

## 2015-12-31 VITALS — BP 144/82 | HR 80 | Resp 12 | Ht 61.0 in | Wt 150.0 lb

## 2015-12-31 DIAGNOSIS — C50912 Malignant neoplasm of unspecified site of left female breast: Secondary | ICD-10-CM

## 2015-12-31 DIAGNOSIS — C50412 Malignant neoplasm of upper-outer quadrant of left female breast: Secondary | ICD-10-CM

## 2015-12-31 LAB — SURGICAL PATHOLOGY

## 2015-12-31 NOTE — Telephone Encounter (Signed)
Notified final path showed 4.2 cm tumor ( > 6 cm on MRI) and lymph nodes showing isolated tumor cells.  Will be sending Mammoprint and then arranging for re-assessment with Dr. Mike Gip.

## 2015-12-31 NOTE — Progress Notes (Signed)
Patient ID: Bianca Shaw, female   DOB: Oct 18, 1945, 70 y.o.   MRN: [id number]  Chief Complaint  Patient presents with  . Routine Post Op    HPI Bianca Shaw is a 70 y.o. female.  Here today or postoperative visit, right mastectomy on 12-26-15. She states she is doing well. Drain sheet present. She is here today wit her husband, Sport and exercise psychologist.  HPI  Past Medical History:  Diagnosis Date  . Anemia   . Anxiety disorder   . Cancer ([place]) 12/03/2015   left breast/ INVASIVE LOBULAR CARCINOMA.   . Cough    lingering, mild, finished Prednisone and anitbiotic 11/03/15  . Family history of adverse reaction to anesthesia    sister - PONV  . GERD (gastroesophageal reflux disease)   . H/O: hysterectomy   . Hypertension   . Osteopenia   . Osteoporosis   . Pericarditis    diagnonsed June, 2010, unclear etiology as of yer  . Personal history of tobacco use, presenting hazards to health 10/31/2015  . Scleroderma ([facility])    ONLY ON SKIN-MILD  . UTI (lower urinary tract infection)   . Wears dentures    full upper    Past Surgical History:  Procedure Laterality Date  . BREAST BIOPSY Right 2012   core - neg  . BREAST BIOPSY Left 12/03/2015   INVASIVE LOBULAR CARCINOMA.   Marland Kitchen CATARACT EXTRACTION W/ INTRAOCULAR LENS IMPLANT Right   . COLONOSCOPY WITH PROPOFOL N/A 11/08/2015   Procedure: COLONOSCOPY WITH PROPOFOL;  Surgeon: Lucilla Lame, MD;  Location: [place];  Service: Endoscopy;  Laterality: N/A;  . MASTECTOMY W/ SENTINEL NODE BIOPSY Left 12/26/2015   Procedure: MASTECTOMY WITH SENTINEL LYMPH NODE BIOPSY;  Surgeon: Robert Bellow, MD;  Location: [facility];  Service: General;  Laterality: Left;  . TUBAL LIGATION    . VESICOVAGINAL FISTULA CLOSURE W/ TAH      Family History  Problem Relation Age of Onset  . Heart failure Mother   . Epilepsy Mother   . COPD Father   . Heart disease Father   . Anxiety disorder Sister   . Arthritis Brother   . Heart disease Brother   .  Vaginal cancer Paternal Grandmother   . Heart attack Paternal Grandfather   . COPD Brother   . Kidney failure Brother   . COPD Brother   . Arthritis Sister   . Uterine cancer    . Diabetes    . Colon cancer Neg Hx   . Stomach cancer Neg Hx   . Breast cancer Neg Hx     Social History Social History  Substance Use Topics  . Smoking status: Former Smoker    Packs/day: 0.75    Years: 40.00    Quit date: 11/07/2003  . Smokeless tobacco: Never Used  . Alcohol use 4.2 oz/week    7 Glasses of wine per week     Comment: 1-3 a day WINE    Allergies  Allergen Reactions  . Diuretic  [Buchu-Cornsilk-Ch Grass-Hydran]     hyponatremia  . Furosemide     hives  . Other     SSRI---hyponatremia  . Pimenta Nausea And Vomiting    Current Outpatient Prescriptions  Medication Sig Dispense Refill  . albuterol (PROVENTIL) (2.5 MG/3ML) 0.083% nebulizer solution Take 3 mLs (2.5 mg total) by nebulization every 6 (six) hours as needed for wheezing or shortness of breath. 75 mL 12  . alendronate (FOSAMAX) 70 MG tablet Take 1 tablet (70  mg total) by mouth every 7 (seven) days. Take with a full glass of water on an empty stomach. 4 tablet 11  . ALPRAZolam (XANAX) 0.5 MG tablet Take 1 tablet (0.5 mg total) by mouth every morning. 1/2 tablet twice daily as needed 30 tablet 01  . amLODipine-olmesartan (AZOR) 10-40 MG tablet Take 1 tablet by mouth as needed.     Marland Kitchen aspirin 81 MG tablet Take by mouth.    . busPIRone (BUSPAR) 5 MG tablet 1 Tablet, Oral QHS 60 tablet 2  . Calcium-Magnesium-Vitamin D (CALCIUM MAGNESIUM PO) Take by mouth.    . cetirizine (ZYRTEC) 10 MG tablet Take 10 mg by mouth daily.    . cholecalciferol (VITAMIN D) 1000 units tablet Take 1,000 Units by mouth daily.    . fluticasone (FLONASE) 50 MCG/ACT nasal spray Place 2 sprays into the nose as needed.     Marland Kitchen ibuprofen (ADVIL,MOTRIN) 200 MG tablet Take 200 mg by mouth every 6 (six) hours as needed.      . loratadine (CLARITIN) 10 MG tablet  Take 10 mg by mouth daily as needed. Reported on 10/14/2015    . Magnesium Oxide -Mg Supplement 250 MG TABS Take 1 tablet by mouth every morning.     . metoprolol succinate (TOPROL-XL) 25 MG 24 hr tablet Take 1 tablet (25 mg total) by mouth daily. (Patient taking differently: Take 25 mg by mouth every morning. ) 30 tablet 3  . omeprazole (PRILOSEC) 20 MG capsule Take 20 mg by mouth as needed.      No current facility-administered medications for this visit.     Review of Systems Review of Systems  Constitutional: Negative.   Respiratory: Negative.   Cardiovascular: Negative.     Blood pressure (!) 144/82, pulse 80, resp. rate 12, height 5\' 1"  (1.549 m), weight 150 lb (68 kg).  Physical Exam Physical Exam  Constitutional: She is oriented to person, place, and time. She appears well-developed and well-nourished.  HENT:  Mouth/Throat: Oropharynx is clear and moist.  Eyes: Conjunctivae are normal. No scleral icterus.  Neck: Neck supple.  Pulmonary/Chest:    Bruising and mild swelling to incision site right mastectomy site. Drain present.   Musculoskeletal:  110 range of motion upper arms.  Lymphadenopathy:    She has no cervical adenopathy.  Neurological: She is alert and oriented to person, place, and time.  Skin: Skin is warm and dry.  Psychiatric: Her behavior is normal.    Data Reviewed Pathology showed a 4.2 cm invasive lobular carcinoma, ER/PR positive. 3 lymph nodes with isolated tumor cells.  Assessment    Likely small volume of retained blood with secondary ecchymosis of the chest wall postmastectomy.  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    Plan    I suspect that the patient's loss of compression is in part responsible for the ecchymosis present at this time. This should resolve. The wound was dressed to allow her to shower and local heat should help alleviate the skin thickening and the underlying hematoma.  We'll arrange for Mammoprint testing.  Follow-up exam  in one week.    Continue recording drainage. May shower. Mammoprint  This information has been scribed by Karie Fetch RN, BSN,BC.   Robert Bellow 01/01/2016, 6:58 AM

## 2015-12-31 NOTE — Patient Instructions (Addendum)
The patient is aware to call back for any questions or concerns. Continue recording drainage. May shower.

## 2016-01-03 ENCOUNTER — Encounter: Payer: Self-pay | Admitting: Radiology

## 2016-01-03 ENCOUNTER — Other Ambulatory Visit: Payer: Self-pay | Admitting: Family Medicine

## 2016-01-07 ENCOUNTER — Ambulatory Visit (INDEPENDENT_AMBULATORY_CARE_PROVIDER_SITE_OTHER): Payer: PPO | Admitting: *Deleted

## 2016-01-07 DIAGNOSIS — C50912 Malignant neoplasm of unspecified site of left female breast: Secondary | ICD-10-CM

## 2016-01-07 NOTE — Patient Instructions (Signed)
The patient is aware to call back for any questions or concerns.  

## 2016-01-07 NOTE — Progress Notes (Signed)
  Oncology Nurse Navigator Documentation  Navigator Location: [facility] (01/02/16 1522) Navigator Encounter Type: Telephone (01/02/16 1522) Telephone: Outgoing Call;Patient Update (01/02/16 1522)         Patient Visit Type: Follow-up;Surgery (01/02/16 1522)                              Time Spent with Patient: 30 (01/02/16 1522)   Phoned patient post surgery.  States she is doing well with minimal discomfort.

## 2016-01-07 NOTE — Progress Notes (Signed)
   Patient ID: Bianca Shaw, female   DOB: 1945/12/11, 70 y.o.   MRN: [id number]   Patient came in today for a wound check post mastectomy.  The wound is clean, with no signs of infection noted. Follow up as scheduled. Drainage is 60-75 ml a day. Dressing changed. Follow u p as scheduled.

## 2016-01-13 ENCOUNTER — Encounter: Payer: Self-pay | Admitting: General Surgery

## 2016-01-13 ENCOUNTER — Other Ambulatory Visit: Payer: Self-pay | Admitting: General Surgery

## 2016-01-13 ENCOUNTER — Encounter
Admission: RE | Admit: 2016-01-13 | Discharge: 2016-01-13 | Disposition: A | Discharge status: Home / Self Care | Payer: PPO | Source: Ambulatory Visit | Attending: General Surgery | Admitting: General Surgery

## 2016-01-13 ENCOUNTER — Ambulatory Visit (INDEPENDENT_AMBULATORY_CARE_PROVIDER_SITE_OTHER): Payer: PPO | Admitting: General Surgery

## 2016-01-13 VITALS — BP 132/64 | HR 70 | Resp 12 | Ht 61.0 in | Wt 149.0 lb

## 2016-01-13 DIAGNOSIS — N6489 Other specified disorders of breast: Secondary | ICD-10-CM | POA: Insufficient documentation

## 2016-01-13 DIAGNOSIS — Z9012 Acquired absence of left breast and nipple: Secondary | ICD-10-CM

## 2016-01-13 DIAGNOSIS — C50912 Malignant neoplasm of unspecified site of left female breast: Secondary | ICD-10-CM

## 2016-01-13 NOTE — Progress Notes (Signed)
Patient ID: Bianca Shaw, female   DOB: May 08, 1945, 70 y.o.   MRN: [id number]  Chief Complaint  Patient presents with  . Routine Post Op    mastetomy    HPI Bianca Shaw is a 70 y.o. female   Here today or postoperative visit, right mastectomy on 12-26-15. She states she is doing well. Drain sheet present. HPI  Past Medical History:  Diagnosis Date  . Anemia   . Anxiety disorder   . Cancer ([place]) 12/03/2015   left breast/ INVASIVE LOBULAR CARCINOMA.   . Cough    lingering, mild, finished Prednisone and anitbiotic 11/03/15  . Family history of adverse reaction to anesthesia    sister - PONV  . GERD (gastroesophageal reflux disease)   . H/O: hysterectomy   . Hypertension   . Osteopenia   . Osteoporosis   . Pericarditis    diagnonsed June, 2010, unclear etiology as of yer  . Personal history of tobacco use, presenting hazards to health 10/31/2015  . Scleroderma ([facility])    ONLY ON SKIN-MILD  . UTI (lower urinary tract infection)   . Wears dentures    full upper    Past Surgical History:  Procedure Laterality Date  . BREAST BIOPSY Right 2012   core - neg  . BREAST BIOPSY Left 12/03/2015   INVASIVE LOBULAR CARCINOMA.   Marland Kitchen CATARACT EXTRACTION W/ INTRAOCULAR LENS IMPLANT Right   . COLONOSCOPY WITH PROPOFOL N/A 11/08/2015   Procedure: COLONOSCOPY WITH PROPOFOL;  Surgeon: Lucilla Lame, MD;  Location: [place];  Service: Endoscopy;  Laterality: N/A;  . MASTECTOMY W/ SENTINEL NODE BIOPSY Left 12/26/2015   Procedure: MASTECTOMY WITH SENTINEL LYMPH NODE BIOPSY;  Surgeon: Robert Bellow, MD;  Location: [facility];  Service: General;  Laterality: Left;  . TUBAL LIGATION    . VESICOVAGINAL FISTULA CLOSURE W/ TAH      Family History  Problem Relation Age of Onset  . Heart failure Mother   . Epilepsy Mother   . COPD Father   . Heart disease Father   . Anxiety disorder Sister   . Arthritis Brother   . Heart disease Brother   . Vaginal cancer Paternal Grandmother   .  Heart attack Paternal Grandfather   . COPD Brother   . Kidney failure Brother   . COPD Brother   . Arthritis Sister   . Uterine cancer    . Diabetes    . Colon cancer Neg Hx   . Stomach cancer Neg Hx   . Breast cancer Neg Hx     Social History Social History  Substance Use Topics  . Smoking status: Former Smoker    Packs/day: 0.75    Years: 40.00    Quit date: 11/07/2003  . Smokeless tobacco: Never Used  . Alcohol use 4.2 oz/week    7 Glasses of wine per week     Comment: 1-3 a day WINE    Allergies  Allergen Reactions  . Diuretic  [Buchu-Cornsilk-Ch Grass-Hydran]     hyponatremia  . Furosemide     hives  . Other     SSRI---hyponatremia  . Pimenta Nausea And Vomiting    Current Outpatient Prescriptions  Medication Sig Dispense Refill  . albuterol (PROVENTIL) (2.5 MG/3ML) 0.083% nebulizer solution Take 3 mLs (2.5 mg total) by nebulization every 6 (six) hours as needed for wheezing or shortness of breath. 75 mL 12  . alendronate (FOSAMAX) 70 MG tablet Take 1 tablet (70 mg total) by mouth every  7 (seven) days. Take with a full glass of water on an empty stomach. 4 tablet 11  . ALPRAZolam (XANAX) 0.5 MG tablet Take 1 tablet (0.5 mg total) by mouth every morning. 1/2 tablet twice daily as needed 30 tablet 01  . amLODipine-olmesartan (AZOR) 10-40 MG tablet Take 1 tablet by mouth as needed.     . busPIRone (BUSPAR) 5 MG tablet 1 Tablet, Oral QHS 60 tablet 2  . Calcium-Magnesium-Vitamin D (CALCIUM MAGNESIUM PO) Take by mouth.    . cetirizine (ZYRTEC) 10 MG tablet Take 10 mg by mouth daily.    . cholecalciferol (VITAMIN D) 1000 units tablet Take 1,000 Units by mouth daily.    . fluticasone (FLONASE) 50 MCG/ACT nasal spray Place 2 sprays into the nose as needed.     Marland Kitchen ibuprofen (ADVIL,MOTRIN) 200 MG tablet Take 200 mg by mouth every 6 (six) hours as needed.      . loratadine (CLARITIN) 10 MG tablet Take 10 mg by mouth daily as needed. Reported on 10/14/2015    . Magnesium Oxide  -Mg Supplement 250 MG TABS Take 1 tablet by mouth every morning.     . metoprolol succinate (TOPROL-XL) 25 MG 24 hr tablet Take 1 tablet (25 mg total) by mouth daily. 30 tablet 12  . omeprazole (PRILOSEC) 20 MG capsule Take 20 mg by mouth as needed.     Marland Kitchen aspirin 81 MG tablet Take by mouth.     No current facility-administered medications for this visit.     Review of Systems Review of Systems  Constitutional: Negative.   Respiratory: Negative.   Cardiovascular: Negative.     Blood pressure 132/64, pulse 70, resp. rate 12, height 5\' 1"  (1.549 m), weight 149 lb (67.6 kg).  Physical Exam Physical Exam  Constitutional: She is oriented to person, place, and time. She appears well-developed and well-nourished.  Cardiovascular: Normal rate, regular rhythm and normal heart sounds.   Pulmonary/Chest: Effort normal and breath sounds normal.    Neurological: She is alert and oriented to person, place, and time.  Skin: Skin is warm and dry.    Data Reviewed Drainage still > 60 cc / day.   Assessment    Post operative hematoma with persistent drainage.     Plan    Recommended evacuation of hematoma expedite healing.  Patient to be scheduled at [facility] at 7:15 AM  The patient is scheduled for surgery at [facility] on 01/14/16. She will pre admit today by phone. The patient is aware of date and instructions.   This information has been scribed by Gaspar Cola CMA.  Robert Bellow 01/13/2016, 12:28 PM

## 2016-01-13 NOTE — Progress Notes (Signed)
  Oncology Nurse Navigator Documentation  Navigator Location: [facility] (01/13/16 0800) Navigator Encounter Type: Telephone (01/13/16 0800) Telephone: Lahoma Crocker Call;Incoming Call (01/13/16 0800)         Patient Visit Type: Follow-up (01/13/16 0800)                              Time Spent with Patient: 30 (01/13/16 0800)  Patient called with concerns about JP drain.       Wanted to make sure she was seeing Dr. Bary Castilla at office visit today.  Phoned office to explain patient concerns. She is scheduled to see MD today.

## 2016-01-13 NOTE — Patient Instructions (Signed)
  Your procedure is scheduled on: 01-14-16  Report to Same Day Surgery 2nd floor medical mall @ 6 AM PER PT    Remember: Instructions that are not followed completely may result in serious medical risk, up to and including death, or upon the discretion of your surgeon and anesthesiologist your surgery may need to be rescheduled.    _x___ 1. Do not eat food or drink liquids after midnight. No gum chewing or hard candies.     __x__ 2. No Alcohol for 24 hours before or after surgery.   __x__3. No Smoking for 24 prior to surgery.   ____  4. Bring all medications with you on the day of surgery if instructed.    __x__ 5. Notify your doctor if there is any change in your medical condition     (cold, fever, infections).     Do not wear jewelry, make-up, hairpins, clips or nail polish.  Do not wear lotions, powders, or perfumes. You may wear deodorant.  Do not shave 48 hours prior to surgery. Men may shave face and neck.  Do not bring valuables to the hospital.    [facility] is not responsible for any belongings or valuables.               Contacts, dentures or bridgework may not be worn into surgery.  Leave your suitcase in the car. After surgery it may be brought to your room.  For patients admitted to the hospital, discharge time is determined by your treatment team.   Patients discharged the day of surgery will not be allowed to drive home.    Please read over the following fact sheets that you were given:   [facility] Preparing for Surgery and or MRSA Information   _x___ Take these medicines the morning of surgery with A SIP OF WATER:    1.AZOR   2.METOPROLOL  3.MAGNESIUM  4.PRILOSEC  5.TAKE A PRILOSEC BEFORE BED  6.  ____ Fleet Enema (as directed)   ____ Use CHG Soap or sage wipes as directed on instruction sheet   ____ Use inhalers on the day of surgery and bring to hospital day of surgery  ____ Stop metformin 2 days prior to surgery    ____ Take 1/2 of usual insulin  dose the night before surgery and none on the morning of  surgery.   ____ Stop aspirin or coumadin, or plavix  ___ Stop Anti-inflammatories such as Advil, Aleve, Ibuprofen, Motrin, Naproxen,          Naprosyn, Goodies powders or aspirin products. Ok to take Tylenol.   ____ Stop supplements until after surgery.    ____ Bring C-Pap to the hospital.

## 2016-01-13 NOTE — Patient Instructions (Addendum)
Patient to be seen tomorrow at [facility].   The patient is scheduled for surgery at [facility] on 01/14/16. She will pre admit today by phone. The patient is aware of date and instructions.

## 2016-01-14 ENCOUNTER — Encounter
Admission: RE | Disposition: A | Discharge status: Home / Self Care | Payer: Self-pay | Source: Ambulatory Visit | Attending: General Surgery

## 2016-01-14 ENCOUNTER — Ambulatory Visit: Payer: PPO | Admitting: Certified Registered"

## 2016-01-14 ENCOUNTER — Encounter: Payer: Self-pay | Admitting: *Deleted

## 2016-01-14 ENCOUNTER — Ambulatory Visit
Admission: RE | Admit: 2016-01-14 | Discharge: 2016-01-14 | Disposition: A | Discharge status: Home / Self Care | Payer: PPO | Source: Ambulatory Visit | Attending: General Surgery | Admitting: General Surgery

## 2016-01-14 DIAGNOSIS — Z7982 Long term (current) use of aspirin: Secondary | ICD-10-CM | POA: Insufficient documentation

## 2016-01-14 DIAGNOSIS — I10 Essential (primary) hypertension: Secondary | ICD-10-CM | POA: Insufficient documentation

## 2016-01-14 DIAGNOSIS — Z7983 Long term (current) use of bisphosphonates: Secondary | ICD-10-CM | POA: Diagnosis not present

## 2016-01-14 DIAGNOSIS — K219 Gastro-esophageal reflux disease without esophagitis: Secondary | ICD-10-CM | POA: Insufficient documentation

## 2016-01-14 DIAGNOSIS — F419 Anxiety disorder, unspecified: Secondary | ICD-10-CM | POA: Diagnosis not present

## 2016-01-14 DIAGNOSIS — Z9012 Acquired absence of left breast and nipple: Secondary | ICD-10-CM

## 2016-01-14 DIAGNOSIS — Z87891 Personal history of nicotine dependence: Secondary | ICD-10-CM | POA: Insufficient documentation

## 2016-01-14 DIAGNOSIS — M8580 Other specified disorders of bone density and structure, unspecified site: Secondary | ICD-10-CM | POA: Insufficient documentation

## 2016-01-14 DIAGNOSIS — L7622 Postprocedural hemorrhage and hematoma of skin and subcutaneous tissue following other procedure: Secondary | ICD-10-CM | POA: Diagnosis not present

## 2016-01-14 DIAGNOSIS — Z853 Personal history of malignant neoplasm of breast: Secondary | ICD-10-CM | POA: Insufficient documentation

## 2016-01-14 DIAGNOSIS — M810 Age-related osteoporosis without current pathological fracture: Secondary | ICD-10-CM | POA: Insufficient documentation

## 2016-01-14 DIAGNOSIS — N6489 Other specified disorders of breast: Secondary | ICD-10-CM | POA: Diagnosis not present

## 2016-01-14 DIAGNOSIS — L7632 Postprocedural hematoma of skin and subcutaneous tissue following other procedure: Secondary | ICD-10-CM | POA: Insufficient documentation

## 2016-01-14 DIAGNOSIS — Z79899 Other long term (current) drug therapy: Secondary | ICD-10-CM | POA: Insufficient documentation

## 2016-01-14 HISTORY — PX: EVACUATION BREAST HEMATOMA: SHX1537

## 2016-01-14 SURGERY — EVACUATION, HEMATOMA, BREAST
Anesthesia: Monitor Anesthesia Care | Site: Breast | Laterality: Left | Wound class: Clean

## 2016-01-14 MED ORDER — LABETALOL HCL 5 MG/ML IV SOLN
INTRAVENOUS | Status: DC | PRN
  Administered 2016-01-14 (×2): 5 mg via INTRAVENOUS

## 2016-01-14 MED ORDER — BUPIVACAINE HCL (PF) 0.5 % IJ SOLN
INTRAMUSCULAR | Status: AC
  Filled 2016-01-14: qty 30

## 2016-01-14 MED ORDER — PROPOFOL 500 MG/50ML IV EMUL
INTRAVENOUS | Status: DC | PRN
  Administered 2016-01-14: 50 ug/kg/min via INTRAVENOUS

## 2016-01-14 MED ORDER — FENTANYL CITRATE (PF) 100 MCG/2ML IJ SOLN
INTRAMUSCULAR | Status: DC | PRN
  Administered 2016-01-14 (×2): 50 ug via INTRAVENOUS

## 2016-01-14 MED ORDER — LIDOCAINE HCL (PF) 1 % IJ SOLN
INTRAMUSCULAR | Status: AC
  Filled 2016-01-14: qty 30

## 2016-01-14 MED ORDER — CEFAZOLIN SODIUM-DEXTROSE 2-4 GM/100ML-% IV SOLN
INTRAVENOUS | Status: AC
  Administered 2016-01-14: 2 g via INTRAVENOUS
  Filled 2016-01-14: qty 100

## 2016-01-14 MED ORDER — LIDOCAINE HCL 1 % IJ SOLN
INTRAMUSCULAR | Status: DC | PRN
  Administered 2016-01-14: 10 mL

## 2016-01-14 MED ORDER — LACTATED RINGERS IV SOLN
INTRAVENOUS | Status: DC
  Administered 2016-01-14: 07:00:00 via INTRAVENOUS

## 2016-01-14 MED ORDER — ONDANSETRON HCL 4 MG/2ML IJ SOLN: 4.0000 mg | Freq: Once | INTRAMUSCULAR | Status: DC | PRN

## 2016-01-14 MED ORDER — CEFAZOLIN SODIUM-DEXTROSE 2-4 GM/100ML-% IV SOLN
2.0000 g | INTRAVENOUS | Status: AC
Start: 2016-01-14 — End: 2016-01-14
  Administered 2016-01-14: 2 g via INTRAVENOUS

## 2016-01-14 MED ORDER — ONDANSETRON HCL 4 MG/2ML IJ SOLN
INTRAMUSCULAR | Status: DC | PRN
  Administered 2016-01-14: 4 mg via INTRAVENOUS

## 2016-01-14 MED ORDER — SODIUM BICARBONATE 4 % IV SOLN
INTRAVENOUS | Status: AC
  Filled 2016-01-14: qty 5

## 2016-01-14 MED ORDER — FENTANYL CITRATE (PF) 100 MCG/2ML IJ SOLN
INTRAMUSCULAR | Status: AC
  Filled 2016-01-14: qty 2

## 2016-01-14 MED ORDER — FENTANYL CITRATE (PF) 100 MCG/2ML IJ SOLN
25.0000 ug | INTRAMUSCULAR | Status: DC | PRN
  Administered 2016-01-14 (×2): 25 ug via INTRAVENOUS

## 2016-01-14 SURGICAL SUPPLY — 34 items
BANDAGE ELASTIC 6 LF NS (GAUZE/BANDAGES/DRESSINGS) ×2 IMPLANT
BNDG CMPR MED 5X6 ELC HKLP NS (GAUZE/BANDAGES/DRESSINGS) ×1
BNDG GAUZE 4.5X4.1 6PLY STRL (MISCELLANEOUS) ×3 IMPLANT
CANISTER SUCT 1200ML W/VALVE (MISCELLANEOUS) ×2 IMPLANT
CHLORAPREP W/TINT 26ML (MISCELLANEOUS) ×2 IMPLANT
DRAPE LAPAROTOMY 100X77 ABD (DRAPES) ×2 IMPLANT
DRESSING TELFA 4X3 1S ST N-ADH (GAUZE/BANDAGES/DRESSINGS) ×2 IMPLANT
ELECT CAUTERY BLADE TIP 2.5 (TIP) ×2
ELECT REM PT RETURN 9FT ADLT (ELECTROSURGICAL) ×2
ELECTRODE CAUTERY BLDE TIP 2.5 (TIP) ×1 IMPLANT
ELECTRODE REM PT RTRN 9FT ADLT (ELECTROSURGICAL) ×1 IMPLANT
GAUZE FLUFF 18X24 1PLY STRL (GAUZE/BANDAGES/DRESSINGS) ×1 IMPLANT
GLOVE BIO SURGEON STRL SZ7.5 (GLOVE) ×2 IMPLANT
GLOVE INDICATOR 8.0 STRL GRN (GLOVE) ×2 IMPLANT
GOWN STRL REUS W/ TWL LRG LVL3 (GOWN DISPOSABLE) ×2 IMPLANT
GOWN STRL REUS W/TWL LRG LVL3 (GOWN DISPOSABLE) ×4
KIT RM TURNOVER STRD PROC AR (KITS) ×2 IMPLANT
LABEL OR SOLS (LABEL) ×2 IMPLANT
NDL HYPO 25X1 1.5 SAFETY (NEEDLE) ×1 IMPLANT
NDL SAFETY 22GX1.5 (NEEDLE) ×2 IMPLANT
NEEDLE HYPO 25X1 1.5 SAFETY (NEEDLE) ×2 IMPLANT
NS IRRIG 1000ML POUR BTL (IV SOLUTION) ×2 IMPLANT
PACK BASIN MINOR ARMC (MISCELLANEOUS) ×2 IMPLANT
STRIP CLOSURE SKIN 1/2X4 (GAUZE/BANDAGES/DRESSINGS) ×2 IMPLANT
SUT ETHILON 3-0 FS-10 30 BLK (SUTURE) ×2
SUT VIC AB 2-0 CT1 27 (SUTURE) ×2
SUT VIC AB 2-0 CT1 TAPERPNT 27 (SUTURE) ×1 IMPLANT
SUT VIC AB 2-0 CT2 27 (SUTURE) ×4 IMPLANT
SUT VIC AB 4-0 FS2 27 (SUTURE) ×2 IMPLANT
SUTURE EHLN 3-0 FS-10 30 BLK (SUTURE) IMPLANT
SWAB CULTURE AMIES ANAERIB BLU (MISCELLANEOUS) ×2 IMPLANT
SWABSTK COMLB BENZOIN TINCTURE (MISCELLANEOUS) ×2 IMPLANT
SYR BULB EAR ULCER 3OZ GRN STR (SYRINGE) ×1 IMPLANT
SYR CONTROL 10ML (SYRINGE) ×4 IMPLANT

## 2016-01-14 NOTE — Op Note (Signed)
Preoperative diagnosis: Hematoma left mastectomy site.  Postoperative diagnosis: Same.  I procedure: Drainage of hematoma.  Operating surgeon: Hervey Ard, M.D.  Anesthesia: 10 mL 1% plain Xylocaine, monitored anesthesia care.  Estimated blood loss: 150-200 mL hematoma.  Clinical note: This 70 year old woman is 2-1/2 weeks status post a left simple mastectomy. She developed a hematoma postoperatively. She shown progressive impairment shoulder function and slow resolution of the hematoma. She was felt to be a candidate for drainage.  Operative note: The patient received Kefzol prior the procedure. The chest was prepped with ChloraPrep and draped. The lateral incision was opened with a hemostat and a Yankauer suction used to evacuate the hematoma. The cavity was then irrigated with sterile water. The skin incision was closed with a running 3-0 nylon suture. Telfa, fluffed gauze, Kerlix and Ace wrap applied.  The patient had transient hypertension at the end procedure, but otherwise tolerated the procedure well and was taken recovery room in stable condition.

## 2016-01-14 NOTE — Transfer of Care (Signed)
Immediate Anesthesia Transfer of Care Note  Patient: Bianca Shaw  Procedure(s) Performed: Procedure(s): EVACUATION HEMATOMA BREAST (Left)  Patient Location: PACU  Anesthesia Type:MAC  Level of Consciousness: awake  Airway & Oxygen Therapy: Patient Spontanous Breathing  Post-op Assessment: Report given to RN  Post vital signs: Reviewed  Last Vitals:  Vitals:   01/14/16 0612 01/14/16 0744  BP: (!) 186/76 116/63  Pulse: 64 72  Resp: 18 15  Temp: 36.7 C 36.3 C    Last Pain:  Vitals:   01/14/16 0744  TempSrc: Tympanic         Complications: No apparent anesthesia complications

## 2016-01-14 NOTE — Anesthesia Preprocedure Evaluation (Signed)
Anesthesia Evaluation  Patient identified by MRN, date of birth, ID band Patient awake    Reviewed: Allergy & Precautions, NPO status , Patient's Chart, lab work & pertinent test results  History of Anesthesia Complications (+) Family history of anesthesia reaction and history of anesthetic complications (sister with PONV)  Airway Mallampati: II       Dental  (+) Edentulous Upper, Edentulous Lower, Upper Dentures, Lower Dentures   Pulmonary neg shortness of breath, neg sleep apnea, COPD,  COPD inhaler, neg recent URI, former smoker,           Cardiovascular Exercise Tolerance: Good hypertension, Pt. on medications and Pt. on home beta blockers      Neuro/Psych PSYCHIATRIC DISORDERS (Anxiety) negative neurological ROS     GI/Hepatic Neg liver ROS, GERD  Medicated and Controlled,  Endo/Other  negative endocrine ROS  Renal/GU negative Renal ROS  negative genitourinary   Musculoskeletal   Abdominal   Peds  Hematology  (+) anemia ,   Anesthesia Other Findings Past Medical History: No date: Anemia No date: Anxiety disorder 12/03/2015: Cancer ([place])     Comment: left breast/ INVASIVE LOBULAR CARCINOMA.  No date: Cough     Comment: lingering, mild, finished Prednisone and               anitbiotic 11/03/15 No date: Family history of adverse reaction to anesthes*     Comment: sister - PONV No date: GERD (gastroesophageal reflux disease) No date: H/O: hysterectomy No date: Hypertension No date: Osteopenia No date: Osteoporosis No date: Pericarditis     Comment: diagnonsed June, 2010, unclear etiology as of               yer 10/31/2015: Personal history of tobacco use, presenting ha* No date: Scleroderma ([place])     Comment: ONLY ON SKIN-MILD No date: UTI (lower urinary tract infection) No date: Wears dentures     Comment: full upper   Reproductive/Obstetrics negative OB ROS                              Anesthesia Physical  Anesthesia Plan  ASA: II  Anesthesia Plan: General   Post-op Pain Management:    Induction: Intravenous  Airway Management Planned: LMA  Additional Equipment:   Intra-op Plan:   Post-operative Plan:   Informed Consent: I have reviewed the patients History and Physical, chart, labs and discussed the procedure including the risks, benefits and alternatives for the proposed anesthesia with the patient or authorized representative who has indicated his/her understanding and acceptance.     Plan Discussed with: Anesthesiologist and CRNA  Anesthesia Plan Comments:         Anesthesia Quick Evaluation

## 2016-01-14 NOTE — H&P (Signed)
No change in clinical exam or history. For evacuation of mastectomy site hematoma.

## 2016-01-14 NOTE — Discharge Instructions (Signed)

## 2016-01-14 NOTE — Anesthesia Postprocedure Evaluation (Signed)
Anesthesia Post Note  Patient: Bianca Shaw  Procedure(s) Performed: Procedure(s) (LRB): EVACUATION HEMATOMA BREAST (Left)  Patient location during evaluation: PACU Anesthesia Type: MAC Level of consciousness: awake Pain management: pain level controlled Vital Signs Assessment: post-procedure vital signs reviewed and stable Respiratory status: spontaneous breathing Cardiovascular status: stable Postop Assessment: no headache Anesthetic complications: no    Last Vitals:  Vitals:   01/14/16 0612 01/14/16 0744  BP: (!) 186/76 116/63  Pulse: 64 72  Resp: 18 15  Temp: 36.7 C 36.3 C    Last Pain:  Vitals:   01/14/16 0744  TempSrc: Tympanic                 Pama Roskos

## 2016-01-17 ENCOUNTER — Ambulatory Visit (INDEPENDENT_AMBULATORY_CARE_PROVIDER_SITE_OTHER): Payer: PPO

## 2016-01-17 ENCOUNTER — Encounter: Payer: Self-pay | Admitting: General Surgery

## 2016-01-17 DIAGNOSIS — N6489 Other specified disorders of breast: Secondary | ICD-10-CM

## 2016-01-17 NOTE — Patient Instructions (Signed)
Follow up as schedeuled

## 2016-01-17 NOTE — Progress Notes (Signed)
Patient ID: Bianca Shaw, female   DOB: 03/09/1946, 70 y.o.   MRN: [id number] Patient came in today for a wound check. The wound is clean, with no signs of infection noted. Mastectomy site re wrapped. Drain in place. Old bruising noted to left chest wall. Follow up as scheduled.

## 2016-01-23 ENCOUNTER — Ambulatory Visit (INDEPENDENT_AMBULATORY_CARE_PROVIDER_SITE_OTHER): Payer: PPO | Admitting: General Surgery

## 2016-01-23 ENCOUNTER — Other Ambulatory Visit: Payer: Self-pay | Admitting: *Deleted

## 2016-01-23 ENCOUNTER — Encounter: Payer: Self-pay | Admitting: General Surgery

## 2016-01-23 VITALS — BP 142/78 | HR 72 | Resp 12 | Ht 63.0 in | Wt 147.0 lb

## 2016-01-23 DIAGNOSIS — N6489 Other specified disorders of breast: Secondary | ICD-10-CM

## 2016-01-23 NOTE — Progress Notes (Signed)
Patient ID: Bianca Shaw, female   DOB: 1945-09-10, 69 y.o.   MRN: [id number]  Chief Complaint  Patient presents with  . Routine Post Op    HPI Bianca Shaw is a 70 y.o. female Here today or postoperative visit, right mastectomy on 12-26-15. And follow up from I &D on  01/14/16.She states she is doing well. Drain sheet present. HPI  Past Medical History:  Diagnosis Date  . Anemia   . Anxiety disorder   . Cancer ([place]) 12/03/2015   left breast/ INVASIVE LOBULAR CARCINOMA.   . Cough    lingering, mild, finished Prednisone and anitbiotic 11/03/15  . Family history of adverse reaction to anesthesia    sister - PONV  . GERD (gastroesophageal reflux disease)   . H/O: hysterectomy   . Hypertension   . Osteopenia   . Osteoporosis   . Pericarditis    diagnonsed June, 2010, unclear etiology as of yer  . Personal history of tobacco use, presenting hazards to health 10/31/2015  . Scleroderma ([facility])    ONLY ON SKIN-MILD  . UTI (lower urinary tract infection)   . Wears dentures    full upper    Past Surgical History:  Procedure Laterality Date  . BREAST BIOPSY Right 2012   core - neg  . BREAST BIOPSY Left 12/03/2015   INVASIVE LOBULAR CARCINOMA.   Marland Kitchen CATARACT EXTRACTION W/ INTRAOCULAR LENS IMPLANT Right   . COLONOSCOPY WITH PROPOFOL N/A 11/08/2015   Procedure: COLONOSCOPY WITH PROPOFOL;  Surgeon: Lucilla Lame, MD;  Location: [place];  Service: Endoscopy;  Laterality: N/A;  . EVACUATION BREAST HEMATOMA Left 01/14/2016   Procedure: EVACUATION HEMATOMA BREAST;  Surgeon: Robert Bellow, MD;  Location: [facility];  Service: General;  Laterality: Left;  Marland Kitchen MASTECTOMY W/ SENTINEL NODE BIOPSY Left 12/26/2015   Procedure: MASTECTOMY WITH SENTINEL LYMPH NODE BIOPSY;  Surgeon: Robert Bellow, MD;  Location: [facility];  Service: General;  Laterality: Left;  . TUBAL LIGATION    . VESICOVAGINAL FISTULA CLOSURE W/ TAH      Family History  Problem Relation Age of Onset  . Heart  failure Mother   . Epilepsy Mother   . COPD Father   . Heart disease Father   . Anxiety disorder Sister   . Arthritis Brother   . Heart disease Brother   . Vaginal cancer Paternal Grandmother   . Heart attack Paternal Grandfather   . COPD Brother   . Kidney failure Brother   . COPD Brother   . Arthritis Sister   . Uterine cancer    . Diabetes    . Colon cancer Neg Hx   . Stomach cancer Neg Hx   . Breast cancer Neg Hx     Social History Social History  Substance Use Topics  . Smoking status: Former Smoker    Packs/day: 0.75    Years: 40.00    Quit date: 11/07/2003  . Smokeless tobacco: Never Used  . Alcohol use 4.2 oz/week    7 Glasses of wine per week     Comment: 1-3 a day WINE    Allergies  Allergen Reactions  . Diuretic  [Buchu-Cornsilk-Ch Grass-Hydran]     hyponatremia  . Furosemide     hives  . Other     SSRI---hyponatremia  . Pimenta Nausea And Vomiting    Current Outpatient Prescriptions  Medication Sig Dispense Refill  . albuterol (PROVENTIL) (2.5 MG/3ML) 0.083% nebulizer solution Take 3 mLs (2.5 mg total) by nebulization  every 6 (six) hours as needed for wheezing or shortness of breath. 75 mL 12  . alendronate (FOSAMAX) 70 MG tablet Take 1 tablet (70 mg total) by mouth every 7 (seven) days. Take with a full glass of water on an empty stomach. 4 tablet 11  . ALPRAZolam (XANAX) 0.5 MG tablet Take 1 tablet (0.5 mg total) by mouth every morning. 1/2 tablet twice daily as needed 30 tablet 01  . amLODipine-olmesartan (AZOR) 10-40 MG tablet Take 1 tablet by mouth as needed.     Marland Kitchen aspirin 81 MG tablet Take by mouth.    . busPIRone (BUSPAR) 5 MG tablet 1 Tablet, Oral QHS 60 tablet 2  . Calcium-Magnesium-Vitamin D (CALCIUM MAGNESIUM PO) Take by mouth.    . cetirizine (ZYRTEC) 10 MG tablet Take 10 mg by mouth as needed.     . cholecalciferol (VITAMIN D) 1000 units tablet Take 1,000 Units by mouth daily.    . fluticasone (FLONASE) 50 MCG/ACT nasal spray Place 2  sprays into the nose as needed.     Marland Kitchen ibuprofen (ADVIL,MOTRIN) 200 MG tablet Take 200 mg by mouth every 6 (six) hours as needed.      . loratadine (CLARITIN) 10 MG tablet Take 10 mg by mouth daily as needed. Reported on 10/14/2015    . Magnesium Oxide -Mg Supplement 250 MG TABS Take 1 tablet by mouth every morning.     . metoprolol succinate (TOPROL-XL) 25 MG 24 hr tablet Take 1 tablet (25 mg total) by mouth daily. (Patient taking differently: Take 25 mg by mouth every morning. ) 30 tablet 12  . omeprazole (PRILOSEC) 20 MG capsule Take 20 mg by mouth as needed.      No current facility-administered medications for this visit.     Review of Systems Review of Systems  Constitutional: Negative.   Respiratory: Negative.   Cardiovascular: Negative.     Blood pressure (!) 142/78, pulse 72, resp. rate 12, height 5\' 3"  (1.6 m), weight 147 lb (66.7 kg).  Physical Exam Physical Exam  Constitutional: She is oriented to person, place, and time. She appears well-developed and well-nourished.  Pulmonary/Chest:    Neurological: She is alert and oriented to person, place, and time.  Skin: Skin is warm and dry.  Shoulder range of motion markedly improved, now up to 100-110 abduction.    Assessment  Marked improvement and shoulder range of motion status post hematoma drainage.        Plan    The patient will work on gentle stretching exercises. She may shower. Continued to use local heat carefully with care being taken to push the heating pad over the shoulder or onto the left lateral chest wall for temperature sensation.    Return in one week.  This information has been scribed by Gaspar Cola CMA.  Robert Bellow 01/23/2016, 8:56 AM

## 2016-01-23 NOTE — Patient Instructions (Signed)
Return in one week.  

## 2016-01-23 NOTE — Progress Notes (Signed)
    Oncology Nurse Navigator Documentation  Navigator Location: [facility] (01/23/16 1400) Navigator Encounter Type: Telephone (01/23/16 1400) Telephone: Incoming Call;Outgoing Call;Appt Confirmation/Clarification;Symptom Mgt (01/23/16 1400)         Patient Visit Type: Follow-up (01/23/16 1400)                              Time Spent with Patient: 30 (01/23/16 1400)  Phone message left from patient.  She saw Dr. Bary Castilla today. Still has drain from left mastectomy.  States she does not have follow-up appointment with Oncologist.  Also questions when she can start driving.  Messages sent to Dr. Kem Parkinson nurse, and to [facility].

## 2016-01-24 ENCOUNTER — Telehealth: Payer: Self-pay

## 2016-01-24 NOTE — Telephone Encounter (Signed)
-----   Message from Robert Bellow, MD sent at 01/23/2016  8:58 PM EDT ----- Regarding: RE: Driving The patient may drive when she is pain-free. ----- Message ----- From: Lesly Rubenstein, LPN Sent: 12/14/6013   2:33 PM To: Robert Bellow, MD Subject: FW: Driving                                      ----- Message ----- From: Theodore Demark, RN Sent: 01/23/2016   2:19 PM To: Lesly Rubenstein, LPN Subject: Driving                                        Bianca Shaw wanted to ask Dr. Bary Castilla when she can start driving. Would you check ? Thank you! Webb Silversmith

## 2016-01-24 NOTE — Telephone Encounter (Signed)
Patient notified that she may drive when she is pain free.

## 2016-01-24 NOTE — Progress Notes (Signed)
  Oncology Nurse Navigator Documentation  Navigator Location: [facility] (01/24/16 1100) Navigator Encounter Type: Telephone (01/24/16 1100) Telephone: Lahoma Crocker Call;Appt Confirmation/Clarification (01/24/16 1100)                                        Time Spent with Patient: 15 (01/24/16 1100)  Patient notified of follow-up appointment wth Dr. Mike Gip on 02/04/16 at 8:45 a.m.

## 2016-01-28 ENCOUNTER — Encounter: Payer: Self-pay | Admitting: General Surgery

## 2016-01-29 ENCOUNTER — Encounter: Payer: Self-pay | Admitting: General Surgery

## 2016-01-29 ENCOUNTER — Ambulatory Visit (INDEPENDENT_AMBULATORY_CARE_PROVIDER_SITE_OTHER): Payer: PPO | Admitting: General Surgery

## 2016-01-29 VITALS — BP 146/88 | HR 68 | Resp 14 | Ht 61.0 in | Wt 149.0 lb

## 2016-01-29 DIAGNOSIS — Z170 Estrogen receptor positive status [ER+]: Secondary | ICD-10-CM

## 2016-01-29 DIAGNOSIS — C50412 Malignant neoplasm of upper-outer quadrant of left female breast: Secondary | ICD-10-CM

## 2016-01-29 NOTE — Patient Instructions (Signed)
The patient is aware to call back for any questions or concerns.  

## 2016-01-29 NOTE — Progress Notes (Signed)
Patient ID: Bianca Shaw, female   DOB: 1945/06/22, 70 y.o.   MRN: [id number]  Chief Complaint  Patient presents with  . Routine Post Op    HPI Bianca Shaw is a 70 y.o. female Here today or postoperative visit, right mastectomy on 12-26-15. And follow up from I &D on  01/14/16.She states she is doing well. Drain sheet present. She is here with her husband, Cara Thaxton.  HPI  Past Medical History:  Diagnosis Date  . Anemia   . Anxiety disorder   . Cancer ([place]) 12/03/2015   left breast/ INVASIVE LOBULAR CARCINOMA.   . Cough    lingering, mild, finished Prednisone and anitbiotic 11/03/15  . Family history of adverse reaction to anesthesia    sister - PONV  . GERD (gastroesophageal reflux disease)   . H/O: hysterectomy   . Hypertension   . Osteopenia   . Osteoporosis   . Pericarditis    diagnonsed June, 2010, unclear etiology as of yer  . Personal history of tobacco use, presenting hazards to health 10/31/2015  . Scleroderma ([facility])    ONLY ON SKIN-MILD  . UTI (lower urinary tract infection)   . Wears dentures    full upper    Past Surgical History:  Procedure Laterality Date  . BREAST BIOPSY Right 2012   core - neg  . BREAST BIOPSY Left 12/03/2015   INVASIVE LOBULAR CARCINOMA.   Marland Kitchen CATARACT EXTRACTION W/ INTRAOCULAR LENS IMPLANT Right   . COLONOSCOPY WITH PROPOFOL N/A 11/08/2015   Procedure: COLONOSCOPY WITH PROPOFOL;  Surgeon: Lucilla Lame, MD;  Location: [place];  Service: Endoscopy;  Laterality: N/A;  . EVACUATION BREAST HEMATOMA Left 01/14/2016   Procedure: EVACUATION HEMATOMA BREAST;  Surgeon: Robert Bellow, MD;  Location: [facility];  Service: General;  Laterality: Left;  Marland Kitchen MASTECTOMY W/ SENTINEL NODE BIOPSY Left 12/26/2015   Procedure: MASTECTOMY WITH SENTINEL LYMPH NODE BIOPSY;  Surgeon: Robert Bellow, MD;  Location: [facility];  Service: General;  Laterality: Left;  . TUBAL LIGATION    . VESICOVAGINAL FISTULA CLOSURE W/ TAH      Family  History  Problem Relation Age of Onset  . Heart failure Mother   . Epilepsy Mother   . COPD Father   . Heart disease Father   . Anxiety disorder Sister   . Arthritis Brother   . Heart disease Brother   . Vaginal cancer Paternal Grandmother   . Heart attack Paternal Grandfather   . COPD Brother   . Kidney failure Brother   . COPD Brother   . Arthritis Sister   . Uterine cancer    . Diabetes    . Colon cancer Neg Hx   . Stomach cancer Neg Hx   . Breast cancer Neg Hx     Social History Social History  Substance Use Topics  . Smoking status: Former Smoker    Packs/day: 0.75    Years: 40.00    Quit date: 11/07/2003  . Smokeless tobacco: Never Used  . Alcohol use 4.2 oz/week    7 Glasses of wine per week     Comment: 1-3 a day WINE    Allergies  Allergen Reactions  . Diuretic  [Buchu-Cornsilk-Ch Grass-Hydran]     hyponatremia  . Furosemide     hives  . Other     SSRI---hyponatremia  . Pimenta Nausea And Vomiting    Current Outpatient Prescriptions  Medication Sig Dispense Refill  . albuterol (PROVENTIL) (2.5 MG/3ML) 0.083% nebulizer  solution Take 3 mLs (2.5 mg total) by nebulization every 6 (six) hours as needed for wheezing or shortness of breath. 75 mL 12  . alendronate (FOSAMAX) 70 MG tablet Take 1 tablet (70 mg total) by mouth every 7 (seven) days. Take with a full glass of water on an empty stomach. 4 tablet 11  . ALPRAZolam (XANAX) 0.5 MG tablet Take 1 tablet (0.5 mg total) by mouth every morning. 1/2 tablet twice daily as needed 30 tablet 01  . amLODipine-olmesartan (AZOR) 10-40 MG tablet Take 1 tablet by mouth as needed.     Marland Kitchen aspirin 81 MG tablet Take by mouth.    . busPIRone (BUSPAR) 5 MG tablet 1 Tablet, Oral QHS 60 tablet 2  . Calcium-Magnesium-Vitamin D (CALCIUM MAGNESIUM PO) Take by mouth.    . cetirizine (ZYRTEC) 10 MG tablet Take 10 mg by mouth as needed.     . cholecalciferol (VITAMIN D) 1000 units tablet Take 1,000 Units by mouth daily.    .  fluticasone (FLONASE) 50 MCG/ACT nasal spray Place 2 sprays into the nose as needed.     Marland Kitchen ibuprofen (ADVIL,MOTRIN) 200 MG tablet Take 200 mg by mouth every 6 (six) hours as needed.      . loratadine (CLARITIN) 10 MG tablet Take 10 mg by mouth daily as needed. Reported on 10/14/2015    . Magnesium Oxide -Mg Supplement 250 MG TABS Take 1 tablet by mouth every morning.     . metoprolol succinate (TOPROL-XL) 25 MG 24 hr tablet Take 1 tablet (25 mg total) by mouth daily. (Patient taking differently: Take 25 mg by mouth every morning. ) 30 tablet 12  . omeprazole (PRILOSEC) 20 MG capsule Take 20 mg by mouth as needed.      No current facility-administered medications for this visit.     Review of Systems Review of Systems  Constitutional: Negative.   Respiratory: Negative.   Cardiovascular: Negative.     Blood pressure (!) 146/88, pulse 68, resp. rate 14, height 5\' 1"  (1.549 m), weight 149 lb (67.6 kg).  Physical Exam Physical Exam  Constitutional: She is oriented to person, place, and time. She appears well-developed and well-nourished.  Pulmonary/Chest:    Drain removed  Musculoskeletal:  Range of motion has improved.  Neurological: She is alert and oriented to person, place, and time.  Skin: Skin is warm and dry.  Psychiatric: Her behavior is normal.    Data Reviewed Drainage volume is now 30 mL per day, serous.  Assessment    Doing well status post hematoma drainage.    Plan    The drain was removed without incident. She is aware that it's likely she'll need aspiration at her one week follow-up.    May shower, avoid repeatative motion to the left arm. Follow up in one week.  This information has been scribed by Karie Fetch RN, BSN,BC.   Robert Bellow 01/31/2016, 2:04 PM

## 2016-02-04 ENCOUNTER — Other Ambulatory Visit: Payer: Self-pay | Admitting: Family Medicine

## 2016-02-04 ENCOUNTER — Inpatient Hospital Stay: Payer: PPO

## 2016-02-04 ENCOUNTER — Inpatient Hospital Stay: Payer: PPO | Attending: Hematology and Oncology | Admitting: Hematology and Oncology

## 2016-02-04 ENCOUNTER — Encounter (INDEPENDENT_AMBULATORY_CARE_PROVIDER_SITE_OTHER): Payer: Self-pay

## 2016-02-04 ENCOUNTER — Other Ambulatory Visit: Payer: Self-pay | Admitting: *Deleted

## 2016-02-04 ENCOUNTER — Encounter: Payer: Self-pay | Admitting: General Surgery

## 2016-02-04 ENCOUNTER — Ambulatory Visit (INDEPENDENT_AMBULATORY_CARE_PROVIDER_SITE_OTHER): Payer: PPO | Admitting: General Surgery

## 2016-02-04 ENCOUNTER — Encounter: Payer: Self-pay | Admitting: Hematology and Oncology

## 2016-02-04 VITALS — BP 124/74 | HR 72 | Resp 14 | Ht 62.0 in | Wt 160.0 lb

## 2016-02-04 VITALS — BP 120/79 | HR 69 | Temp 95.6°F | Resp 18 | Wt 146.8 lb

## 2016-02-04 DIAGNOSIS — Z170 Estrogen receptor positive status [ER+]: Secondary | ICD-10-CM | POA: Diagnosis not present

## 2016-02-04 DIAGNOSIS — M810 Age-related osteoporosis without current pathological fracture: Secondary | ICD-10-CM | POA: Diagnosis not present

## 2016-02-04 DIAGNOSIS — K219 Gastro-esophageal reflux disease without esophagitis: Secondary | ICD-10-CM | POA: Diagnosis not present

## 2016-02-04 DIAGNOSIS — Z79899 Other long term (current) drug therapy: Secondary | ICD-10-CM | POA: Diagnosis not present

## 2016-02-04 DIAGNOSIS — C50412 Malignant neoplasm of upper-outer quadrant of left female breast: Secondary | ICD-10-CM | POA: Diagnosis not present

## 2016-02-04 DIAGNOSIS — Z90710 Acquired absence of both cervix and uterus: Secondary | ICD-10-CM | POA: Diagnosis not present

## 2016-02-04 DIAGNOSIS — Z9012 Acquired absence of left breast and nipple: Secondary | ICD-10-CM | POA: Insufficient documentation

## 2016-02-04 DIAGNOSIS — I10 Essential (primary) hypertension: Secondary | ICD-10-CM | POA: Insufficient documentation

## 2016-02-04 DIAGNOSIS — M349 Systemic sclerosis, unspecified: Secondary | ICD-10-CM

## 2016-02-04 DIAGNOSIS — F419 Anxiety disorder, unspecified: Secondary | ICD-10-CM | POA: Diagnosis not present

## 2016-02-04 DIAGNOSIS — Z87891 Personal history of nicotine dependence: Secondary | ICD-10-CM | POA: Diagnosis not present

## 2016-02-04 DIAGNOSIS — N6489 Other specified disorders of breast: Secondary | ICD-10-CM

## 2016-02-04 DIAGNOSIS — Z7982 Long term (current) use of aspirin: Secondary | ICD-10-CM | POA: Diagnosis not present

## 2016-02-04 LAB — CBC WITH DIFFERENTIAL/PLATELET
Basophils Absolute: 0.1 10*3/uL (ref 0–0.1)
Basophils Relative: 1 %
Eosinophils Absolute: 0.1 10*3/uL (ref 0–0.7)
Eosinophils Relative: 2 %
HCT: 32.6 % — ABNORMAL LOW (ref 35.0–47.0)
Hemoglobin: 11.3 g/dL — ABNORMAL LOW (ref 12.0–16.0)
Lymphocytes Relative: 14 %
Lymphs Abs: 0.9 10*3/uL — ABNORMAL LOW (ref 1.0–3.6)
MCH: 34.1 pg — ABNORMAL HIGH (ref 26.0–34.0)
MCHC: 34.6 g/dL (ref 32.0–36.0)
MCV: 98.5 fL (ref 80.0–100.0)
Monocytes Absolute: 0.7 10*3/uL (ref 0.2–0.9)
Monocytes Relative: 11 %
Neutro Abs: 4.4 10*3/uL (ref 1.4–6.5)
Neutrophils Relative %: 72 %
Platelets: 324 10*3/uL (ref 150–440)
RBC: 3.31 MIL/uL — ABNORMAL LOW (ref 3.80–5.20)
RDW: 12.6 % (ref 11.5–14.5)
WBC: 6.1 10*3/uL (ref 3.6–11.0)

## 2016-02-04 LAB — COMPREHENSIVE METABOLIC PANEL
ALT: 16 U/L (ref 14–54)
AST: 22 U/L (ref 15–41)
Albumin: 4.2 g/dL (ref 3.5–5.0)
Alkaline Phosphatase: 76 U/L (ref 38–126)
Anion gap: 9 (ref 5–15)
BUN: 16 mg/dL (ref 6–20)
CO2: 23 mmol/L (ref 22–32)
Calcium: 9.5 mg/dL (ref 8.9–10.3)
Chloride: 94 mmol/L — ABNORMAL LOW (ref 101–111)
Creatinine, Ser: 1.08 mg/dL — ABNORMAL HIGH (ref 0.44–1.00)
GFR calc Af Amer: 59 mL/min — ABNORMAL LOW (ref >60)
GFR calc non Af Amer: 51 mL/min — ABNORMAL LOW (ref >60)
Glucose, Bld: 102 mg/dL — ABNORMAL HIGH (ref 65–99)
Potassium: 4.6 mmol/L (ref 3.5–5.1)
Sodium: 126 mmol/L — ABNORMAL LOW (ref 135–145)
Total Bilirubin: 0.4 mg/dL (ref 0.3–1.2)
Total Protein: 8.1 g/dL (ref 6.5–8.1)

## 2016-02-04 MED ORDER — TAMOXIFEN CITRATE 20 MG PO TABS: 20.0000 mg | ORAL_TABLET | Freq: Every day | ORAL | 2 refills | Status: DC

## 2016-02-04 NOTE — Progress Notes (Signed)
Patient ID: Bianca Shaw, female   DOB: [birth year], 70 y.o.   MRN: [id number]  Chief Complaint  Patient presents with  . Follow-up    HPI Bianca Shaw is a 70 y.o. female female Here today or postoperative visit, right mastectomy on 12-26-15. And follow up from drainage from hemomta on 01/14/16.She states she is doing well. HPI  Past Medical History:  Diagnosis Date  . Anemia   . Anxiety disorder   . Cancer ([place]) 12/03/2015   left breast/ INVASIVE LOBULAR CARCINOMA.   . Cough    lingering, mild, finished Prednisone and anitbiotic 11/03/15  . Family history of adverse reaction to anesthesia    sister - PONV  . GERD (gastroesophageal reflux disease)   . H/O: hysterectomy   . Hypertension   . Osteopenia   . Osteoporosis   . Pericarditis    diagnonsed June, 2010, unclear etiology as of yer  . Personal history of tobacco use, presenting hazards to health 10/31/2015  . Scleroderma ([facility])    ONLY ON SKIN-MILD  . UTI (lower urinary tract infection)   . Wears dentures    full upper    Past Surgical History:  Procedure Laterality Date  . BREAST BIOPSY Right 2012   core - neg  . BREAST BIOPSY Left 12/03/2015   INVASIVE LOBULAR CARCINOMA.   Marland Kitchen CATARACT EXTRACTION W/ INTRAOCULAR LENS IMPLANT Right   . COLONOSCOPY WITH PROPOFOL N/A 11/08/2015   Procedure: COLONOSCOPY WITH PROPOFOL;  Surgeon: Lucilla Lame, MD;  Location: [place];  Service: Endoscopy;  Laterality: N/A;  . EVACUATION BREAST HEMATOMA Left 01/14/2016   Procedure: EVACUATION HEMATOMA BREAST;  Surgeon: Robert Bellow, MD;  Location: [facility];  Service: General;  Laterality: Left;  Marland Kitchen MASTECTOMY W/ SENTINEL NODE BIOPSY Left 12/26/2015   Procedure: MASTECTOMY WITH SENTINEL LYMPH NODE BIOPSY;  Surgeon: Robert Bellow, MD;  Location: [facility];  Service: General;  Laterality: Left;  . TUBAL LIGATION    . VESICOVAGINAL FISTULA CLOSURE W/ TAH      Family History  Problem Relation Age of Onset  . Heart  failure Mother   . Epilepsy Mother   . COPD Father   . Heart disease Father   . Anxiety disorder Sister   . Arthritis Brother   . Heart disease Brother   . Vaginal cancer Paternal Grandmother   . Heart attack Paternal Grandfather   . COPD Brother   . Kidney failure Brother   . COPD Brother   . Arthritis Sister   . Uterine cancer    . Diabetes    . Colon cancer Neg Hx   . Stomach cancer Neg Hx   . Breast cancer Neg Hx     Social History Social History  Substance Use Topics  . Smoking status: Former Smoker    Packs/day: 0.75    Years: 40.00    Quit date: 11/07/2003  . Smokeless tobacco: Never Used  . Alcohol use 4.2 oz/week    7 Glasses of wine per week     Comment: 1-3 a day WINE    Allergies  Allergen Reactions  . Diuretic  [Buchu-Cornsilk-Ch Grass-Hydran]     hyponatremia  . Furosemide     hives  . Other     SSRI---hyponatremia  . Pimenta Nausea And Vomiting    Current Outpatient Prescriptions  Medication Sig Dispense Refill  . albuterol (PROVENTIL) (2.5 MG/3ML) 0.083% nebulizer solution Take 3 mLs (2.5 mg total) by nebulization every 6 (six) hours  as needed for wheezing or shortness of breath. 75 mL 12  . alendronate (FOSAMAX) 70 MG tablet Take 1 tablet (70 mg total) by mouth every 7 (seven) days. Take with a full glass of water on an empty stomach. 4 tablet 11  . ALPRAZolam (XANAX) 0.5 MG tablet Take 1 tablet (0.5 mg total) by mouth every morning. 1/2 tablet twice daily as needed 30 tablet 01  . amLODipine-olmesartan (AZOR) 10-40 MG tablet Take 1 tablet by mouth as needed.     Marland Kitchen aspirin 81 MG tablet Take by mouth.    . busPIRone (BUSPAR) 5 MG tablet 1 Tablet, Oral QHS 60 tablet 2  . Calcium-Magnesium-Vitamin D (CALCIUM MAGNESIUM PO) Take by mouth.    . cetirizine (ZYRTEC) 10 MG tablet Take 10 mg by mouth as needed.     . cholecalciferol (VITAMIN D) 1000 units tablet Take 1,000 Units by mouth daily.    . fluticasone (FLONASE) 50 MCG/ACT nasal spray Place 2  sprays into the nose as needed.     Marland Kitchen ibuprofen (ADVIL,MOTRIN) 200 MG tablet Take 200 mg by mouth every 6 (six) hours as needed.      . loratadine (CLARITIN) 10 MG tablet Take 10 mg by mouth daily as needed. Reported on 10/14/2015    . Magnesium Oxide -Mg Supplement 250 MG TABS Take 1 tablet by mouth every morning.     . metoprolol succinate (TOPROL-XL) 25 MG 24 hr tablet Take 1 tablet (25 mg total) by mouth daily. (Patient taking differently: Take 25 mg by mouth every morning. ) 30 tablet 12  . omeprazole (PRILOSEC) 20 MG capsule Take 20 mg by mouth as needed.     . tamoxifen (NOLVADEX) 20 MG tablet Take 1 tablet (20 mg total) by mouth daily. 30 tablet 2   No current facility-administered medications for this visit.     Review of Systems Review of Systems  Constitutional: Negative.   Respiratory: Negative.   Cardiovascular: Negative.     Blood pressure 124/74, pulse 72, resp. rate 14, height 5\' 2"  (1.575 m), weight 160 lb (72.6 kg).  Physical Exam Physical Exam  Pulmonary/Chest:    Excellent shoulder range of motion.      Assessment    Doing well status post drainage of post mastectomy hematoma.  Return to normal shoulder range of motion    Plan        Patient to return in one month.  This information has been scribed by Gaspar Cola CMA.    Gaspar Cola 02/04/2016, 10:59 AM

## 2016-02-04 NOTE — Patient Instructions (Signed)

## 2016-02-04 NOTE — Progress Notes (Signed)
  Oncology Nurse Navigator Documentation  Navigator Location: [facility] (02/04/16 1400) Navigator Encounter Type: Follow-up Appt (02/04/16 1400)           Patient Visit Type: Follow-up (02/04/16 1400)                Support Groups/Services: Breast Support Group (02/04/16 1400) Specialty Items/DME: Prosthesis (02/04/16 1400)           Time Spent with Patient: 30 (02/04/16 1400)  Met patient and husband at follow-up appointment with Dr. Mike Gip.  She is also seeing Dr. Bary Castilla today. Doing well post-op.  Tamoxifen ordered.  Patient states she will call with any needs.

## 2016-02-04 NOTE — Progress Notes (Signed)
[facility] day:  02/04/2016   Chief Complaint: Bianca Shaw is a 70 y.o. female with stage IIA left breast cancer who is seen for reassessment after interval surgery.  HPI:  The patient was last seen in the medical oncology clinic on 12/10/2015 for initial consultation.  At that time, she had clinical stage IIA (T2N0) left breast cancer.  Left mammogram and ultrasound revealed a 4.2  irregular hypochoic mass.  Left axillary lymph nodes appeared normal.  MRI revealed no contralateral disease.  Biopsy revealed grade II invasive lobular carcinoma. Tumor was ER positive, PR positive, and Her2/neu 2+ (eqivocal).  Her2/neu by FISH was negative.  Bone density revealed osteoporosis with a T score of -2.9.  She had started on Fosamax 1 month previously.  We discussed MammaPrint testing.  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  She underwent mastectomy with sentinel lymph node biopsy on 12/26/2015.   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  Margins were negative.  Two sentinel lymph nodes were negative for macrometastasis, but with isolated tumor cells on IHC stains.  Three additional lymph nodes were positive for isolated tumor cells on IHC.  Pathologic stage was pT2 pN0(i+).  She developed a hematoma postoperatively. She developed progressive impairment shoulder function and slow resolution of the hematoma. She underwent drainage of the hematoma on 01/14/2016.  MammaPrint testing revealed low risk luminal type A. There is a 97.8% probability of being disease free at 10 years with hormonal therapy.  The benefit of chemotherapy is felt to be small.  Symptomatically, she is doing well.  She denies any concerns.   Past Medical History:  Diagnosis Date  . Anemia   . Anxiety disorder   . Cancer ([place]) 12/03/2015   left breast/ INVASIVE LOBULAR  CARCINOMA.   . Cough    lingering, mild, finished Prednisone and anitbiotic 11/03/15  . Family history of adverse reaction to anesthesia    sister - PONV  . GERD (gastroesophageal reflux disease)   . H/O: hysterectomy   . Hypertension   . Osteopenia   . Osteoporosis   . Pericarditis    diagnonsed June, 2010, unclear etiology as of yer  . Personal history of tobacco use, presenting hazards to health 10/31/2015  . Scleroderma ([facility])    ONLY ON SKIN-MILD  . UTI (lower urinary tract infection)   . Wears dentures    full upper    Past Surgical History:  Procedure Laterality Date  . BREAST BIOPSY Right 2012   core - neg  . BREAST BIOPSY Left 12/03/2015   INVASIVE LOBULAR CARCINOMA.   Marland Kitchen CATARACT EXTRACTION W/ INTRAOCULAR LENS IMPLANT Right   . COLONOSCOPY WITH PROPOFOL N/A 11/08/2015   Procedure: COLONOSCOPY WITH PROPOFOL;  Surgeon: Lucilla Lame, MD;  Location: [place];  Service: Endoscopy;  Laterality: N/A;  . EVACUATION BREAST HEMATOMA Left 01/14/2016   Procedure: EVACUATION HEMATOMA BREAST;  Surgeon: Robert Bellow, MD;  Location: [facility];  Service: General;  Laterality: Left;  Marland Kitchen MASTECTOMY W/ SENTINEL NODE BIOPSY Left 12/26/2015   Procedure: MASTECTOMY WITH SENTINEL LYMPH NODE BIOPSY;  Surgeon: Robert Bellow, MD;  Location: [facility];  Service: General;  Laterality: Left;  . TUBAL LIGATION    . VESICOVAGINAL FISTULA CLOSURE W/ TAH      Family History  Problem Relation Age of Onset  . Heart failure Mother   . Epilepsy Mother   .  COPD Father   . Heart disease Father   . Anxiety disorder Sister   . Arthritis Brother   . Heart disease Brother   . Vaginal cancer Paternal Grandmother   . Heart attack Paternal Grandfather   . COPD Brother   . Kidney failure Brother   . COPD Brother   . Arthritis Sister   . Uterine cancer    . Diabetes    . Colon cancer Neg Hx   . Stomach cancer Neg Hx   . Breast cancer Neg Hx     Social History:  reports that she quit  smoking about 12 years ago. She has a 30.00 pack-year smoking history. She has never used smokeless tobacco. She reports that she drinks about 4.2 oz of alcohol per week . She reports that she does not use drugs.  She has 5 brothers and 2 sisters.  She has 2 children who are alive and well.  She lives in [place] with her husband, Pat Patrick.  The patient is accompanied by her husband today.  Allergies:  Allergies  Allergen Reactions  . Diuretic  [Buchu-Cornsilk-Ch Grass-Hydran]     hyponatremia  . Furosemide     hives  . Other     SSRI---hyponatremia  . Pimenta Nausea And Vomiting    Current Medications: Current Outpatient Prescriptions  Medication Sig Dispense Refill  . albuterol (PROVENTIL) (2.5 MG/3ML) 0.083% nebulizer solution Take 3 mLs (2.5 mg total) by nebulization every 6 (six) hours as needed for wheezing or shortness of breath. 75 mL 12  . alendronate (FOSAMAX) 70 MG tablet Take 1 tablet (70 mg total) by mouth every 7 (seven) days. Take with a full glass of water on an empty stomach. 4 tablet 11  . ALPRAZolam (XANAX) 0.5 MG tablet Take 1 tablet (0.5 mg total) by mouth every morning. 1/2 tablet twice daily as needed 30 tablet 01  . amLODipine-olmesartan (AZOR) 10-40 MG tablet Take 1 tablet by mouth as needed.     Marland Kitchen aspirin 81 MG tablet Take by mouth.    . busPIRone (BUSPAR) 5 MG tablet 1 Tablet, Oral QHS 60 tablet 2  . Calcium-Magnesium-Vitamin D (CALCIUM MAGNESIUM PO) Take by mouth.    . cetirizine (ZYRTEC) 10 MG tablet Take 10 mg by mouth as needed.     . cholecalciferol (VITAMIN D) 1000 units tablet Take 1,000 Units by mouth daily.    . fluticasone (FLONASE) 50 MCG/ACT nasal spray Place 2 sprays into the nose as needed.     Marland Kitchen ibuprofen (ADVIL,MOTRIN) 200 MG tablet Take 200 mg by mouth every 6 (six) hours as needed.      . loratadine (CLARITIN) 10 MG tablet Take 10 mg by mouth daily as needed. Reported on 10/14/2015    . Magnesium Oxide -Mg Supplement 250 MG TABS Take 1 tablet by mouth  every morning.     . metoprolol succinate (TOPROL-XL) 25 MG 24 hr tablet Take 1 tablet (25 mg total) by mouth daily. (Patient taking differently: Take 25 mg by mouth every morning. ) 30 tablet 12  . omeprazole (PRILOSEC) 20 MG capsule Take 20 mg by mouth as needed.     . tamoxifen (NOLVADEX) 20 MG tablet Take 1 tablet (20 mg total) by mouth daily. 30 tablet 2   No current facility-administered medications for this visit.     Review of Systems:  GENERAL:  Feels good.  No fevers or sweats.  Weight down 3 pounds. PERFORMANCE STATUS (ECOG): 0 HEENT:  No visual changes,  runny nose, sore throat, mouth sores or tenderness. Lungs: No shortness of breath or cough.  No hemoptysis. Cardiac:  No chest pain, palpitations, orthopnea, or PND. GI:  No nausea, vomiting, diarrhea, constipation, melena or hematochezia.  Colonoscopy done 09/2015. GU:  No urgency, frequency, dysuria, or hematuria. Musculoskeletal:  Osteoporosis on Fosamax.  No back pain.  No joint pain.  No muscle tenderness. Extremities:  No pain or swelling. Skin:  Skin changes due to scleroderma.  Well healing s/p left mastectomy.  No rashes or skin changes. Neuro:  No headache, numbness or weakness, balance or coordination issues. Endocrine:  No diabetes, thyroid issues, hot flashes or night sweats. Psych:  No mood changes, depression or anxiety. Pain:  No focal pain. Review of systems:  All other systems reviewed and found to be negative.  Physical Exam: Blood pressure 120/79, pulse 69, temperature (!) 95.6 F (35.3 C), temperature source Tympanic, resp. rate 18, weight 146 lb 13.2 oz (66.6 kg). GENERAL:  Well developed, well nourished, woman sitting comfortably in the exam room in no acute distress. MENTAL STATUS:  Alert and oriented to person, place and time. HEAD:  Short gray hair.  Normocephalic, atraumatic, face symmetric, no Cushingoid features. EYES:  Blue eyes.  Pupils equal round and reactive to light and accomodation.  No  conjunctivitis or scleral icterus. ENT:  Oropharynx clear without lesion.  Tongue normal. Mucous membranes moist.  RESPIRATORY:  Clear to auscultation without rales, wheezes or rhonchi. CARDIOVASCULAR:  Regular rate and rhythm without murmur, rub or gallop. BREAST:  Right breast without masses, skin changes or nipple discharge.  Slight skin thickening 6-8 o;clock position due to bra abrasion.  Left sided mastectomy with well healing incision.  Medial dressing (small) with overlying Tegaderm. ABDOMEN:  Soft, non-tender, with active bowel sounds, and no hepatosplenomegaly.  No masses. SKIN:  Areas of skin thickening due to abrasion including breast and waist line.  No rashes, ulcers or lesions. EXTREMITIES: No edema, no skin discoloration or tenderness.  No palpable cords. LYMPH NODES: No palpable cervical, supraclavicular, axillary or inguinal adenopathy  NEUROLOGICAL: Unremarkable. PSYCH:  Appropriate.   Appointment on 02/04/2016  Component Date Value Ref Range Status  . WBC 02/04/2016 6.1  3.6 - 11.0 K/uL Final  . RBC 02/04/2016 3.31* 3.80 - 5.20 MIL/uL Final  . Hemoglobin 02/04/2016 11.3* 12.0 - 16.0 g/dL Final  . HCT 02/04/2016 32.6* 35.0 - 47.0 % Final  . MCV 02/04/2016 98.5  80.0 - 100.0 fL Final  . MCH 02/04/2016 34.1* 26.0 - 34.0 pg Final  . MCHC 02/04/2016 34.6  32.0 - 36.0 g/dL Final  . RDW 02/04/2016 12.6  11.5 - 14.5 % Final  . Platelets 02/04/2016 324  150 - 440 K/uL Final  . Neutrophils Relative % 02/04/2016 72  % Final  . Neutro Abs 02/04/2016 4.4  1.4 - 6.5 K/uL Final  . Lymphocytes Relative 02/04/2016 14  % Final  . Lymphs Abs 02/04/2016 0.9* 1.0 - 3.6 K/uL Final  . Monocytes Relative 02/04/2016 11  % Final  . Monocytes Absolute 02/04/2016 0.7  0.2 - 0.9 K/uL Final  . Eosinophils Relative 02/04/2016 2  % Final  . Eosinophils Absolute 02/04/2016 0.1  0 - 0.7 K/uL Final  . Basophils Relative 02/04/2016 1  % Final  . Basophils Absolute 02/04/2016 0.1  0 - 0.1 K/uL Final   . Sodium 02/04/2016 126* 135 - 145 mmol/L Final  . Potassium 02/04/2016 4.6  3.5 - 5.1 mmol/L Final  . Chloride 02/04/2016 94*  101 - 111 mmol/L Final  . CO2 02/04/2016 23  22 - 32 mmol/L Final  . Glucose, Bld 02/04/2016 102* 65 - 99 mg/dL Final  . BUN 02/04/2016 16  6 - 20 mg/dL Final  . Creatinine, Ser 02/04/2016 1.08* 0.44 - 1.00 mg/dL Final  . Calcium 02/04/2016 9.5  8.9 - 10.3 mg/dL Final  . Total Protein 02/04/2016 8.1  6.5 - 8.1 g/dL Final  . Albumin 02/04/2016 4.2  3.5 - 5.0 g/dL Final  . AST 02/04/2016 22  15 - 41 U/L Final  . ALT 02/04/2016 16  14 - 54 U/L Final  . Alkaline Phosphatase 02/04/2016 76  38 - 126 U/L Final  . Total Bilirubin 02/04/2016 0.4  0.3 - 1.2 mg/dL Final  . GFR calc non Af Amer 02/04/2016 51* >60 mL/min Final  . GFR calc Af Amer 02/04/2016 59* >60 mL/min Final   Comment: (NOTE) The eGFR has been calculated using the CKD EPI equation. This calculation has not been validated in all clinical situations. eGFR's persistently <60 mL/min signify possible Chronic Kidney Disease.   . Anion gap 02/04/2016 9  5 - 15 Final    Assessment:  Bianca Shaw is a 69 y.o. female with stage IIA (T2N0) left breast cancer s/p mastectomy with sentinel lymph node biopsy on 12/26/2015.   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  Margins were negative.  Two sentinel lymph nodes were negative for macrometastasis, but with isolated tumor cells on IHC stains.  Three additional lymph nodes were positive for isolated tumor cells on IHC.  Tumor was ER positive (> 90%), PR positive (> 90%), and Her2/neu 2+ (eqivocal).  Her2/neu by FISH was negative.  Pathologic stage was pT2 pN0(i+).  MammaPrint testing revealed low risk luminal type A. There is a 97.8% probability of being disease free at 10 years with hormonal therapy.   Left mammogram and ultrasound on 11/21/2015 revealed a 4.2 x 2.4 x 2.6 cm irregular hypoechoic mass in the  1-2 o'clock position in the left breast 3 cm from the nipple.  Left axillary lymph nodes appeared normal.  Breast MRI on 12/25/2015 revealed a 6.2 cm area of abnormal enhancement in the upper outer quadrant of the left breast.  The right breast revealed no mass or abnormal enhancement.  Bone density on 11/05/2015 revealed osteoporosis with a T score of -2.9 in the AP spine L1-L2 and -1.9 in the left femoral neck.  She states that she started Fosamax in 10/2015.  She has scleroderma.  She has skin thickening at sites of abrasion.  Symptomatically, she denies any complaints.  Exam revels a well healing mastectomy incision.  Plan: 1.  Discuss pathology report.  Discuss stage IIA disease.  Discuss 5 lymph nodes with isolated tumor cells.  Discuss MammaPrint results.  Discuss plan for no chemotherapy.  Discuss hormonal therapy with tamoxifen or aromatase inhibitors.  Discuss potential side effects of tamoxifen including vasomotor symptoms, weight gain, small risk of endometrial cancer and thrombosis. Discuss need for yearly eye exam (cataracts) as well as a pelvic exam. Discuss side effects associated with aromatase inhibitors including vasomotor symptoms, arthralgias and myalgias as well as bone loss.  Bone loss problematic given baseline osteoporosis.  Discuss equivalence of 5 years of an aromatase inhibitor and 2-3 years of tamoxifen plus 2-3 years of an aromatase inhibitor (total 5 years). Discuss consideration of BCI testing in 4-1/2 years to determine the role of extended adjuvant therapy.  After  a long discussion, decision was made to proceed with tamoxifen.  Information provided.  2.  Discuss baseline osteoporosis and continuation of Fosamax, calcium and vitamin D. 3.  Labs today:  CBC with diff, CMP. 4.  Rx:  Tamoxifen 20 mg po q day; dis: 1 month supply with 2 refills. 5.  RTC in 1 month for MD assessment and labs (LFTs).   Lequita Asal, MD  02/04/2016, 4:41 PM

## 2016-02-04 NOTE — Progress Notes (Signed)
Patient states she is itching and wants to know if there is something she can use.  Also wants to know if there is a cream or lotion she can use on her breast to soften the skin.

## 2016-02-04 NOTE — Patient Instructions (Signed)
Return in one month.  

## 2016-02-05 DIAGNOSIS — I10 Essential (primary) hypertension: Secondary | ICD-10-CM | POA: Diagnosis not present

## 2016-02-05 DIAGNOSIS — R809 Proteinuria, unspecified: Secondary | ICD-10-CM | POA: Diagnosis not present

## 2016-02-05 DIAGNOSIS — E871 Hypo-osmolality and hyponatremia: Secondary | ICD-10-CM | POA: Diagnosis not present

## 2016-02-08 MED ORDER — ALPRAZOLAM 0.5 MG PO TABS: 0.5000 mg | ORAL_TABLET | ORAL | 3 refills | Status: DC

## 2016-02-10 ENCOUNTER — Encounter: Payer: Self-pay | Admitting: General Surgery

## 2016-03-09 ENCOUNTER — Ambulatory Visit (INDEPENDENT_AMBULATORY_CARE_PROVIDER_SITE_OTHER): Payer: PPO | Admitting: General Surgery

## 2016-03-09 ENCOUNTER — Encounter: Payer: Self-pay | Admitting: General Surgery

## 2016-03-09 VITALS — BP 118/78 | HR 71 | Resp 12 | Ht 62.0 in | Wt 150.0 lb

## 2016-03-09 DIAGNOSIS — C50112 Malignant neoplasm of central portion of left female breast: Secondary | ICD-10-CM | POA: Diagnosis not present

## 2016-03-09 DIAGNOSIS — C50412 Malignant neoplasm of upper-outer quadrant of left female breast: Secondary | ICD-10-CM

## 2016-03-09 DIAGNOSIS — Z170 Estrogen receptor positive status [ER+]: Secondary | ICD-10-CM

## 2016-03-09 DIAGNOSIS — Z4432 Encounter for fitting and adjustment of external left breast prosthesis: Secondary | ICD-10-CM | POA: Diagnosis not present

## 2016-03-09 NOTE — Patient Instructions (Signed)
Return in three months.

## 2016-03-09 NOTE — Progress Notes (Addendum)
Patient ID: Bianca Shaw, female   DOB: Feb 20, 1946, 70 y.o.   MRN: [id number]  Chief Complaint  Patient presents with  . Follow-up    HPI Bianca Shaw is a 70 y.o. female here today for her one month follow up  left breast mastectomy. Patient states she is doing well.   HPI  Past Medical History:  Diagnosis Date  . Anemia   . Anxiety disorder   . Breast cancer of upper-outer quadrant of left female breast ([place]) 11/2015   pT2 pN0(i+).;ER+; PR +, her 2 neu not overexpressed.  Mastectomy, SLN, Mammoprint: Low risk.   . Cancer ([place]) 12/03/2015   left breast/ INVASIVE LOBULAR CARCINOMA.   . Cough    lingering, mild, finished Prednisone and anitbiotic 11/03/15  . Family history of adverse reaction to anesthesia    sister - PONV  . GERD (gastroesophageal reflux disease)   . H/O: hysterectomy   . Hypertension   . Osteopenia   . Osteoporosis   . Pericarditis    diagnonsed June, 2010, unclear etiology as of yer  . Personal history of tobacco use, presenting hazards to health 10/31/2015  . Scleroderma ([facility])    ONLY ON SKIN-MILD  . UTI (lower urinary tract infection)   . Wears dentures    full upper    Past Surgical History:  Procedure Laterality Date  . BREAST BIOPSY Right 2012   core - neg  . BREAST BIOPSY Left 12/03/2015   INVASIVE LOBULAR CARCINOMA.   Marland Kitchen CATARACT EXTRACTION W/ INTRAOCULAR LENS IMPLANT Right   . COLONOSCOPY WITH PROPOFOL N/A 11/08/2015   Procedure: COLONOSCOPY WITH PROPOFOL;  Surgeon: Lucilla Lame, MD;  Location: [place];  Service: Endoscopy;  Laterality: N/A;  . EVACUATION BREAST HEMATOMA Left 01/14/2016   Procedure: EVACUATION HEMATOMA BREAST;  Surgeon: Robert Bellow, MD;  Location: [facility];  Service: General;  Laterality: Left;  Marland Kitchen MASTECTOMY W/ SENTINEL NODE BIOPSY Left 12/26/2015   Procedure: MASTECTOMY WITH SENTINEL LYMPH NODE BIOPSY;  Surgeon: Robert Bellow, MD;  Location: [facility];  Service: General;  Laterality: Left;  . TUBAL  LIGATION    . VESICOVAGINAL FISTULA CLOSURE W/ TAH      Family History  Problem Relation Age of Onset  . Heart failure Mother   . Epilepsy Mother   . COPD Father   . Heart disease Father   . Anxiety disorder Sister   . Arthritis Brother   . Heart disease Brother   . Vaginal cancer Paternal Grandmother   . Heart attack Paternal Grandfather   . COPD Brother   . Kidney failure Brother   . COPD Brother   . Arthritis Sister   . Uterine cancer    . Diabetes    . Colon cancer Neg Hx   . Stomach cancer Neg Hx   . Breast cancer Neg Hx     Social History Social History  Substance Use Topics  . Smoking status: Former Smoker    Packs/day: 0.75    Years: 40.00    Quit date: 11/07/2003  . Smokeless tobacco: Never Used  . Alcohol use 4.2 oz/week    7 Glasses of wine per week     Comment: 1-3 a day WINE    Allergies  Allergen Reactions  . Diuretic  [Buchu-Cornsilk-Ch Grass-Hydran]     hyponatremia  . Furosemide     hives  . Other     SSRI---hyponatremia  . Pimenta Nausea And Vomiting    Current Outpatient  Prescriptions  Medication Sig Dispense Refill  . albuterol (PROVENTIL) (2.5 MG/3ML) 0.083% nebulizer solution Take 3 mLs (2.5 mg total) by nebulization every 6 (six) hours as needed for wheezing or shortness of breath. 75 mL 12  . alendronate (FOSAMAX) 70 MG tablet Take 1 tablet (70 mg total) by mouth every 7 (seven) days. Take with a full glass of water on an empty stomach. 4 tablet 11  . ALPRAZolam (XANAX) 0.5 MG tablet Take 1 tablet (0.5 mg total) by mouth every morning. 1/2 tablet twice daily as needed 30 tablet 3  . amLODipine-olmesartan (AZOR) 10-40 MG tablet Take 1 tablet by mouth as needed.     Marland Kitchen aspirin 81 MG tablet Take by mouth.    . busPIRone (BUSPAR) 5 MG tablet 1 Tablet, Oral QHS 60 tablet 2  . busPIRone (BUSPAR) 5 MG tablet 1 Tablet, Oral, two times daily 60 tablet 0  . Calcium-Magnesium-Vitamin D (CALCIUM MAGNESIUM PO) Take by mouth.    . cetirizine  (ZYRTEC) 10 MG tablet Take 10 mg by mouth as needed.     . cholecalciferol (VITAMIN D) 1000 units tablet Take 1,000 Units by mouth daily.    . fluticasone (FLONASE) 50 MCG/ACT nasal spray Place 2 sprays into the nose as needed.     Marland Kitchen ibuprofen (ADVIL,MOTRIN) 200 MG tablet Take 200 mg by mouth every 6 (six) hours as needed.      . loratadine (CLARITIN) 10 MG tablet Take 10 mg by mouth daily as needed. Reported on 10/14/2015    . Magnesium Oxide -Mg Supplement 250 MG TABS Take 1 tablet by mouth every morning.     . metoprolol succinate (TOPROL-XL) 25 MG 24 hr tablet Take 1 tablet (25 mg total) by mouth daily. (Patient taking differently: Take 25 mg by mouth every morning. ) 30 tablet 12  . omeprazole (PRILOSEC) 20 MG capsule Take 20 mg by mouth as needed.     . tamoxifen (NOLVADEX) 20 MG tablet Take 1 tablet (20 mg total) by mouth daily. 30 tablet 2   No current facility-administered medications for this visit.     Review of Systems Review of Systems  Constitutional: Negative.   Respiratory: Negative.   Cardiovascular: Negative.     Blood pressure 118/78, pulse 71, resp. rate 12, height 5\' 2"  (1.575 m), weight 150 lb (68 kg).  Physical Exam Physical Exam  Constitutional: She is oriented to person, place, and time. She appears well-developed and well-nourished.  Eyes: Conjunctivae are normal. No scleral icterus.  Neck: Neck supple.  Cardiovascular: Normal rate, regular rhythm and normal heart sounds.   Pulmonary/Chest:    Left mastectomy site healing well.    Lymphadenopathy:    She has no cervical adenopathy.  Neurological: She is alert and oriented to person, place, and time.  Skin: Skin is warm and dry.   Data Review  Medical oncology notes of 02/04/2016 reviewed. Tamoxifen chosen based on bone density showing evidence of osteoporosis.  Mammoprint showing low risk for recurrent disease without adjuvant chemotherapy.  Assessment    Doing well status post hematoma  evacuation.    Plan    Reviewed side effects of tamoxifen including DVT and postmenopausal bleeding.  Prescription for breast prosthesis provided.   Return in three months. This has been scribed by Caryl-Lyn Otis Brace LPN    Robert Bellow 03/09/2016, 10:44 AM

## 2016-03-10 ENCOUNTER — Inpatient Hospital Stay (HOSPITAL_BASED_OUTPATIENT_CLINIC_OR_DEPARTMENT_OTHER): Payer: PPO | Admitting: Hematology and Oncology

## 2016-03-10 ENCOUNTER — Inpatient Hospital Stay: Payer: PPO | Attending: Hematology and Oncology

## 2016-03-10 VITALS — BP 185/50 | HR 55 | Temp 95.2°F | Resp 18 | Wt 150.6 lb

## 2016-03-10 DIAGNOSIS — M810 Age-related osteoporosis without current pathological fracture: Secondary | ICD-10-CM

## 2016-03-10 DIAGNOSIS — Z9012 Acquired absence of left breast and nipple: Secondary | ICD-10-CM | POA: Insufficient documentation

## 2016-03-10 DIAGNOSIS — M349 Systemic sclerosis, unspecified: Secondary | ICD-10-CM | POA: Insufficient documentation

## 2016-03-10 DIAGNOSIS — Z170 Estrogen receptor positive status [ER+]: Secondary | ICD-10-CM

## 2016-03-10 DIAGNOSIS — I10 Essential (primary) hypertension: Secondary | ICD-10-CM | POA: Diagnosis not present

## 2016-03-10 DIAGNOSIS — Z79810 Long term (current) use of selective estrogen receptor modulators (SERMs): Secondary | ICD-10-CM | POA: Insufficient documentation

## 2016-03-10 DIAGNOSIS — Z7982 Long term (current) use of aspirin: Secondary | ICD-10-CM | POA: Diagnosis not present

## 2016-03-10 DIAGNOSIS — Z87891 Personal history of nicotine dependence: Secondary | ICD-10-CM | POA: Insufficient documentation

## 2016-03-10 DIAGNOSIS — C50412 Malignant neoplasm of upper-outer quadrant of left female breast: Secondary | ICD-10-CM

## 2016-03-10 DIAGNOSIS — K219 Gastro-esophageal reflux disease without esophagitis: Secondary | ICD-10-CM | POA: Diagnosis not present

## 2016-03-10 DIAGNOSIS — E871 Hypo-osmolality and hyponatremia: Secondary | ICD-10-CM | POA: Diagnosis not present

## 2016-03-10 DIAGNOSIS — Z90710 Acquired absence of both cervix and uterus: Secondary | ICD-10-CM | POA: Diagnosis not present

## 2016-03-10 DIAGNOSIS — F419 Anxiety disorder, unspecified: Secondary | ICD-10-CM | POA: Diagnosis not present

## 2016-03-10 LAB — HEPATIC FUNCTION PANEL
ALT: 15 U/L (ref 14–54)
AST: 21 U/L (ref 15–41)
Albumin: 3.6 g/dL (ref 3.5–5.0)
Alkaline Phosphatase: 43 U/L (ref 38–126)
Bilirubin, Direct: <0.1 mg/dL — ABNORMAL LOW (ref 0.1–0.5)
Total Bilirubin: 0.2 mg/dL — ABNORMAL LOW (ref 0.3–1.2)
Total Protein: 7.6 g/dL (ref 6.5–8.1)

## 2016-03-10 NOTE — Progress Notes (Signed)
[facility] day:  03/10/2016   Chief Complaint: Bianca Shaw is a 70 y.o. female with stage IIA left breast cancer who is seen for 1 month assessment on tamoxifen.  HPI:  The patient was last seen in the medical oncology clinic on 02/04/2016.  At that time, she was seen for assessment after mastectomy.  She was doing well.  We discussed initiation of tamoxifen.  At last visit, sodium was 126.  She has a longstanding history of hyponatremia.  She saw Dr. Holley Raring about her sodium.  She has cut back on her fluids.  During the interim, she has done well.  She is taking her tamoxifen in the evening.  She denies any hot flashes or night sweats.  She saw Dr Bary Castilla yesterday.  She was noted to be doing well s/p hematoma evacuation.  A prescription for breast prosthesis was provided.  She has a follow-up appointment in 3 months.   Past Medical History:  Diagnosis Date  . Anemia   . Anxiety disorder   . Breast cancer of upper-outer quadrant of left female breast ([facility]) 11/2015   pT2 pN0(i+).;ER+; PR +, her 2 neu not overexpressed.  Mastectomy, SLN, Mammoprint: Low risk.   . Cancer ([facility]) 12/03/2015   left breast/ INVASIVE LOBULAR CARCINOMA.   . Cough    lingering, mild, finished Prednisone and anitbiotic 11/03/15  . Family history of adverse reaction to anesthesia    sister - PONV  . GERD (gastroesophageal reflux disease)   . H/O: hysterectomy   . Hypertension   . Osteopenia   . Osteoporosis   . Pericarditis    diagnonsed June, 2010, unclear etiology as of yer  . Personal history of tobacco use, presenting hazards to health 10/31/2015  . Scleroderma (HCC)    ONLY ON SKIN-MILD  . UTI (lower urinary tract infection)   . Wears dentures    full upper    Past Surgical History:  Procedure Laterality Date  . BREAST BIOPSY Right 2012   core - neg  . BREAST BIOPSY Left 12/03/2015   INVASIVE LOBULAR CARCINOMA.   Marland Kitchen CATARACT EXTRACTION W/  INTRAOCULAR LENS IMPLANT Right   . COLONOSCOPY WITH PROPOFOL N/A 11/08/2015   Procedure: COLONOSCOPY WITH PROPOFOL;  Surgeon: Lucilla Lame, MD;  Location: [facility];  Service: Endoscopy;  Laterality: N/A;  . EVACUATION BREAST HEMATOMA Left 01/14/2016   Procedure: EVACUATION HEMATOMA BREAST;  Surgeon: Robert Bellow, MD;  Location: [facility];  Service: General;  Laterality: Left;  Marland Kitchen MASTECTOMY W/ SENTINEL NODE BIOPSY Left 12/26/2015   Procedure: MASTECTOMY WITH SENTINEL LYMPH NODE BIOPSY;  Surgeon: Robert Bellow, MD;  Location: [facility];  Service: General;  Laterality: Left;  . TUBAL LIGATION    . VESICOVAGINAL FISTULA CLOSURE W/ TAH      Family History  Problem Relation Age of Onset  . Heart failure Mother   . Epilepsy Mother   . COPD Father   . Heart disease Father   . Anxiety disorder Sister   . Arthritis Brother   . Heart disease Brother   . Vaginal cancer Paternal Grandmother   . Heart attack Paternal Grandfather   . COPD Brother   . Kidney failure Brother   . COPD Brother   . Arthritis Sister   . Uterine cancer    . Diabetes    . Colon cancer Neg Hx   . Stomach cancer Neg Hx   . Breast cancer Neg Hx  Social History:  reports that she quit smoking about 12 years ago. She has a 30.00 pack-year smoking history. She has never used smokeless tobacco. She reports that she drinks about 4.2 oz of alcohol per week . She reports that she does not use drugs.  She has 5 brothers and 2 sisters.  She has 2 children who are alive and well.  She lives in [place] with her husband, Pat Patrick.  The patient is accompanied by her husband today.  Allergies:  Allergies  Allergen Reactions  . Diuretic  [Buchu-Cornsilk-Ch Grass-Hydran]     hyponatremia  . Furosemide     hives  . Other     SSRI---hyponatremia  . Pimenta Nausea And Vomiting    Current Medications: Current Outpatient Prescriptions  Medication Sig Dispense Refill  . albuterol (PROVENTIL) (2.5 MG/3ML) 0.083%  nebulizer solution Take 3 mLs (2.5 mg total) by nebulization every 6 (six) hours as needed for wheezing or shortness of breath. 75 mL 12  . alendronate (FOSAMAX) 70 MG tablet Take 1 tablet (70 mg total) by mouth every 7 (seven) days. Take with a full glass of water on an empty stomach. 4 tablet 11  . ALPRAZolam (XANAX) 0.5 MG tablet Take 1 tablet (0.5 mg total) by mouth every morning. 1/2 tablet twice daily as needed 30 tablet 3  . amLODipine-olmesartan (AZOR) 10-40 MG tablet Take 1 tablet by mouth as needed.     Marland Kitchen aspirin 81 MG tablet Take by mouth.    . busPIRone (BUSPAR) 5 MG tablet 1 Tablet, Oral QHS 60 tablet 2  . Calcium-Magnesium-Vitamin D (CALCIUM MAGNESIUM PO) Take by mouth.    . cetirizine (ZYRTEC) 10 MG tablet Take 10 mg by mouth as needed.     . cholecalciferol (VITAMIN D) 1000 units tablet Take 1,000 Units by mouth daily.    . fluticasone (FLONASE) 50 MCG/ACT nasal spray Place 2 sprays into the nose as needed.     Marland Kitchen ibuprofen (ADVIL,MOTRIN) 200 MG tablet Take 200 mg by mouth every 6 (six) hours as needed.      . metoprolol succinate (TOPROL-XL) 25 MG 24 hr tablet Take 1 tablet (25 mg total) by mouth daily. (Patient taking differently: Take 25 mg by mouth every morning. ) 30 tablet 12  . omeprazole (PRILOSEC) 20 MG capsule Take 20 mg by mouth as needed.     . tamoxifen (NOLVADEX) 20 MG tablet Take 1 tablet (20 mg total) by mouth daily. 30 tablet 2   No current facility-administered medications for this visit.     Review of Systems:  GENERAL:  Feels good.  No fevers or sweats.  Weight down 4 pounds. PERFORMANCE STATUS (ECOG): 0 HEENT:  No visual changes, runny nose, sore throat, mouth sores or tenderness. Lungs: No shortness of breath or cough.  No hemoptysis. Cardiac:  No chest pain, palpitations, orthopnea, or PND. GI:  No nausea, vomiting, diarrhea, constipation, melena or hematochezia.  Colonoscopy done 09/2015. GU:  No urgency, frequency, dysuria, or  hematuria. Musculoskeletal:  Osteoporosis on Fosamax.  No back pain.  No joint pain.  No muscle tenderness. Extremities:  No pain or swelling. Skin:  Skin changes due to scleroderma.  Healing well s/p left mastectomy.  No rashes or skin changes. Neuro:  No headache, numbness or weakness, balance or coordination issues. Endocrine:  No diabetes, thyroid issues, hot flashes or night sweats. Psych:  No mood changes, depression or anxiety. Pain:  No focal pain. Review of systems:  All other systems reviewed and  found to be negative.  Physical Exam: Blood pressure (!) 182/89, pulse (!) 55, temperature (!) 95.2 F (35.1 C), temperature source Tympanic, resp. rate 18, weight 150 lb 9.2 oz (68.3 kg). GENERAL:  Well developed, well nourished, woman sitting comfortably in the exam room in no acute distress. MENTAL STATUS:  Alert and oriented to person, place and time. HEAD:  Short gray hair.  Normocephalic, atraumatic, face symmetric, no Cushingoid features. EYES:  Blue eyes.  Pupils equal round and reactive to light and accomodation.  No conjunctivitis or scleral icterus. ENT:  Oropharynx clear without lesion.  Tongue normal. Mucous membranes moist.  RESPIRATORY:  Clear to auscultation without rales, wheezes or rhonchi. CARDIOVASCULAR:  Regular rate and rhythm without murmur, rub or gallop. ABDOMEN:  Soft, non-tender, with active bowel sounds, and no hepatosplenomegaly.  No masses. SKIN:  Areas of skin thickening due to abrasion.  No rashes, ulcers or lesions. EXTREMITIES: No edema, no skin discoloration or tenderness.  No palpable cords. LYMPH NODES: No palpable cervical, supraclavicular, axillary or inguinal adenopathy  NEUROLOGICAL: Unremarkable. PSYCH:  Appropriate.   Appointment on 03/10/2016  Component Date Value Ref Range Status  . Total Protein 03/10/2016 7.6  6.5 - 8.1 g/dL Final  . Albumin 03/10/2016 3.6  3.5 - 5.0 g/dL Final  . AST 03/10/2016 21  15 - 41 U/L Final  . ALT 03/10/2016  15  14 - 54 U/L Final  . Alkaline Phosphatase 03/10/2016 43  38 - 126 U/L Final  . Total Bilirubin 03/10/2016 0.2* 0.3 - 1.2 mg/dL Final  . Bilirubin, Direct 03/10/2016 <0.1* 0.1 - 0.5 mg/dL Final  . Indirect Bilirubin 03/10/2016 NOT CALCULATED  0.3 - 0.9 mg/dL Final    Assessment:  TOVAH SLAVICK is a 70 y.o. female with stage IIA (T2N0) left breast cancer s/p mastectomy with sentinel lymph node biopsy on 12/26/2015.   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  Margins were negative.  Two sentinel lymph nodes were negative for macrometastasis, but with isolated tumor cells on IHC stains.  Three additional lymph nodes were positive for isolated tumor cells on IHC.  Tumor was ER positive (> 90%), PR positive (> 90%), and Her2/neu 2+ (eqivocal).  Her2/neu by FISH was negative.  Pathologic stage was pT2 pN0(i+).  MammaPrint testing revealed low risk luminal type A. There was a 97.8% probability of being disease free at 10 years with hormonal therapy.   Left mammogram and ultrasound on 11/21/2015 revealed a 4.2 x 2.4 x 2.6 cm irregular hypoechoic mass in the 1-2 o'clock position in the left breast 3 cm from the nipple.  Left axillary lymph nodes appeared normal.  Breast MRI on 12/25/2015 revealed a 6.2 cm area of abnormal enhancement in the upper outer quadrant of the left breast.  The right breast revealed no mass or abnormal enhancement.  Bone density on 11/05/2015 revealed osteoporosis with a T score of -2.9 in the AP spine L1-L2 and -1.9 in the left femoral neck.  She started Fosamax in 10/2015.  She is on calcium and vitamin D.  She has scleroderma.  She has skin thickening at sites of abrasion.  She has a history of chronic hyponatremia.  She is on fluid restriction.  Symptomatically, she denies any complaints.  Exam is stable.  Plan: 1.  Labs today:  LFTs. 2.  Continue tamoxifen. 3.  Continue Fosamax, calcium and vitamin D. 4.  RTC  in 3 months for MD assessment and labs (CBC  with diff, CMP, CA27.29).   Lequita Asal, MD  03/10/2016, 10:55 AM

## 2016-03-11 ENCOUNTER — Other Ambulatory Visit: Payer: Self-pay | Admitting: *Deleted

## 2016-03-11 DIAGNOSIS — Z170 Estrogen receptor positive status [ER+]: Principal | ICD-10-CM

## 2016-03-11 DIAGNOSIS — C50412 Malignant neoplasm of upper-outer quadrant of left female breast: Secondary | ICD-10-CM

## 2016-03-25 ENCOUNTER — Ambulatory Visit (INDEPENDENT_AMBULATORY_CARE_PROVIDER_SITE_OTHER): Payer: PPO | Admitting: Family Medicine

## 2016-03-25 VITALS — BP 141/59 | HR 60 | Temp 97.5°F | Ht 62.0 in | Wt 151.5 lb

## 2016-03-25 DIAGNOSIS — Z0000 Encounter for general adult medical examination without abnormal findings: Secondary | ICD-10-CM

## 2016-03-25 NOTE — Patient Instructions (Signed)
Ms. Mahurin , Thank you for taking time to come for your Medicare Wellness Visit. I appreciate your ongoing commitment to your health goals. Please review the following plan we discussed and let me know if I can assist you in the future.   These are the goals we discussed: Goals    . exercise          Starting 03/25/16, I will start walking 15 minutes twice a week.       This is a list of the screening recommended for you and due dates:  Health Maintenance  Topic Date Due  .  Hepatitis C: One time screening is recommended by Center for Disease Control  (CDC) for  adults born from [birth year] through 1966.   09/25/2016*  . Mammogram  11/04/2017  . Tetanus Vaccine  12/26/2017  . Colon Cancer Screening  11/07/2025  . Flu Shot  Completed  . DEXA scan (bone density measurement)  Completed  . Shingles Vaccine  Completed  . Pneumonia vaccines  Completed  *Topic was postponed. The date shown is not the original due date.   Preventive Care for Adults  A healthy lifestyle and preventive care can promote health and wellness. Preventive health guidelines for adults include the following key practices.  . A routine yearly physical is a good way to check with your health care provider about your health and preventive screening. It is a chance to share any concerns and updates on your health and to receive a thorough exam.  . Visit your dentist for a routine exam and preventive care every 6 months. Brush your teeth twice a day and floss once a day. Good oral hygiene prevents tooth decay and gum disease.  . The frequency of eye exams is based on your age, health, family medical history, use  of contact lenses, and other factors. Follow your health care provider's ecommendations for frequency of eye exams.  . Eat a healthy diet. Foods like vegetables, fruits, whole grains, low-fat dairy products, and lean protein foods contain the nutrients you need without too many calories. Decrease your intake of  foods high in solid fats, added sugars, and salt. Eat the right amount of calories for you. Get information about a proper diet from your health care provider, if necessary.  . Regular physical exercise is one of the most important things you can do for your health. Most adults should get at least 150 minutes of moderate-intensity exercise (any activity that increases your heart rate and causes you to sweat) each week. In addition, most adults need muscle-strengthening exercises on 2 or more days a week.  Silver Sneakers may be a benefit available to you. To determine eligibility, you may visit the website: www.silversneakers.com or contact program at (870)480-3136 Mon-Fri between 8AM-8PM.   . Maintain a healthy weight. The body mass index (BMI) is a screening tool to identify possible weight problems. It provides an estimate of body fat based on height and weight. Your health care provider can find your BMI and can help you achieve or maintain a healthy weight.   For adults 20 years and older: ? A BMI below 18.5 is considered underweight. ? A BMI of 18.5 to 24.9 is normal. ? A BMI of 25 to 29.9 is considered overweight. ? A BMI of 30 and above is considered obese.   . Maintain normal blood lipids and cholesterol levels by exercising and minimizing your intake of saturated fat. Eat a balanced diet with plenty of fruit and  vegetables. Blood tests for lipids and cholesterol should begin at age 30 and be repeated every 5 years. If your lipid or cholesterol levels are high, you are over 50, or you are at high risk for heart disease, you may need your cholesterol levels checked more frequently. Ongoing high lipid and cholesterol levels should be treated with medicines if diet and exercise are not working.  . If you smoke, find out from your health care provider how to quit. If you do not use tobacco, please do not start.  . If you choose to drink alcohol, please do not consume more than 2 drinks per  day. One drink is considered to be 12 ounces (355 mL) of beer, 5 ounces (148 mL) of wine, or 1.5 ounces (44 mL) of liquor.  . If you are 23-39 years old, ask your health care provider if you should take aspirin to prevent strokes.  . Use sunscreen. Apply sunscreen liberally and repeatedly throughout the day. You should seek shade when your shadow is shorter than you. Protect yourself by wearing long sleeves, pants, a wide-brimmed hat, and sunglasses year round, whenever you are outdoors.  . Once a month, do a whole body skin exam, using a mirror to look at the skin on your back. Tell your health care provider of new moles, moles that have irregular borders, moles that are larger than a pencil eraser, or moles that have changed in shape or color.

## 2016-03-25 NOTE — Progress Notes (Signed)
Subjective:   Bianca Shaw is a 70 y.o. female who presents for Medicare Annual (Subsequent) preventive examination.  Review of Systems:  N/A  Cardiac Risk Factors include: advanced age (>93men, >8 women);dyslipidemia;hypertension     Objective:     Vitals: BP (!) 141/59 (BP Location: Right Arm)   Pulse 60   Temp 97.5 F (36.4 C) (Oral)   Ht 5\' 2"  (1.575 m)   Wt 151 lb 8 oz (68.7 kg)   BMI 27.71 kg/m   Body mass index is 27.71 kg/m.   Tobacco History  Smoking Status  . Former Smoker  . Packs/day: 0.75  . Years: 40.00  . Types: Cigarettes  . Quit date: 11/07/2003  Smokeless Tobacco  . Never Used     Counseling given: Not Answered   Past Medical History:  Diagnosis Date  . Anemia   . Anxiety disorder   . Breast cancer of upper-outer quadrant of left female breast ([facility]) 11/2015   pT2 pN0(i+).;ER+; PR +, her 2 neu not overexpressed.  Mastectomy, SLN, Mammoprint: Low risk.   . Cancer ([facility]) 12/03/2015   left breast/ INVASIVE LOBULAR CARCINOMA.   . Cough    lingering, mild, finished Prednisone and anitbiotic 11/03/15  . Family history of adverse reaction to anesthesia    sister - PONV  . GERD (gastroesophageal reflux disease)   . H/O: hysterectomy   . Hypertension   . Osteopenia   . Osteoporosis   . Pericarditis    diagnonsed June, 2010, unclear etiology as of yer  . Personal history of tobacco use, presenting hazards to health 10/31/2015  . Scleroderma ([facility])    ONLY ON SKIN-MILD  . UTI (lower urinary tract infection)   . Wears dentures    full upper   Past Surgical History:  Procedure Laterality Date  . BREAST BIOPSY Right 2012   core - neg  . BREAST BIOPSY Left 12/03/2015   INVASIVE LOBULAR CARCINOMA.   Marland Kitchen CATARACT EXTRACTION W/ INTRAOCULAR LENS IMPLANT Right   . COLONOSCOPY WITH PROPOFOL N/A 11/08/2015   Procedure: COLONOSCOPY WITH PROPOFOL;  Surgeon: Lucilla Lame, MD;  Location: [facility];  Service: Endoscopy;  Laterality: N/A;  .  EVACUATION BREAST HEMATOMA Left 01/14/2016   Procedure: EVACUATION HEMATOMA BREAST;  Surgeon: Robert Bellow, MD;  Location: [facility];  Service: General;  Laterality: Left;  Marland Kitchen MASTECTOMY W/ SENTINEL NODE BIOPSY Left 12/26/2015   Procedure: MASTECTOMY WITH SENTINEL LYMPH NODE BIOPSY;  Surgeon: Robert Bellow, MD;  Location: [facility];  Service: General;  Laterality: Left;  . TUBAL LIGATION    . VESICOVAGINAL FISTULA CLOSURE W/ TAH     Family History  Problem Relation Age of Onset  . Heart failure Mother   . Epilepsy Mother   . COPD Father   . Heart disease Father   . Anxiety disorder Sister   . Arthritis Brother   . Heart disease Brother   . Vaginal cancer Paternal Grandmother   . Heart attack Paternal Grandfather   . COPD Brother   . Kidney failure Brother   . COPD Brother   . Arthritis Sister   . Uterine cancer    . Diabetes    . Colon cancer Neg Hx   . Stomach cancer Neg Hx   . Breast cancer Neg Hx    History  Sexual Activity  . Sexual activity: Not on file    Outpatient Encounter Prescriptions as of 03/25/2016  Medication Sig  . albuterol (PROVENTIL) (2.5 MG/3ML)  0.083% nebulizer solution Take 3 mLs (2.5 mg total) by nebulization every 6 (six) hours as needed for wheezing or shortness of breath.  Marland Kitchen alendronate (FOSAMAX) 70 MG tablet Take 1 tablet (70 mg total) by mouth every 7 (seven) days. Take with a full glass of water on an empty stomach.  . ALPRAZolam (XANAX) 0.5 MG tablet Take 1 tablet (0.5 mg total) by mouth every morning. 1/2 tablet twice daily as needed  . amLODipine-olmesartan (AZOR) 10-40 MG tablet Take 1 tablet by mouth as needed.   Marland Kitchen aspirin 81 MG tablet Take by mouth.  . busPIRone (BUSPAR) 5 MG tablet 1 Tablet, Oral QHS  . Calcium-Magnesium-Vitamin D (CALCIUM MAGNESIUM PO) Take by mouth.   . cetirizine (ZYRTEC) 10 MG tablet Take 10 mg by mouth as needed.   . cholecalciferol (VITAMIN D) 1000 units tablet Take 1,000 Units by mouth daily.  . fexofenadine  (ALLEGRA) 180 MG tablet Take 180 mg by mouth daily.  . fluticasone (FLONASE) 50 MCG/ACT nasal spray Place 2 sprays into the nose as needed.   . Glucosamine-Chondroit-Vit C-Mn (GLUCOSAMINE 1500 COMPLEX PO) Take by mouth.  Marland Kitchen ibuprofen (ADVIL,MOTRIN) 200 MG tablet Take 200 mg by mouth every 6 (six) hours as needed.    . loratadine (CLARITIN) 10 MG tablet Take 10 mg by mouth daily.  . metoprolol succinate (TOPROL-XL) 25 MG 24 hr tablet Take 1 tablet (25 mg total) by mouth daily. (Patient taking differently: Take 25 mg by mouth every morning. )  . Omega-3 Fatty Acids (FISH OIL) 1000 MG CAPS Take 1 capsule by mouth.  Marland Kitchen omeprazole (PRILOSEC) 20 MG capsule Take 20 mg by mouth as needed.   . psyllium (METAMUCIL SMOOTH TEXTURE) 28 % packet Take 1 packet by mouth 2 (two) times daily.  . tamoxifen (NOLVADEX) 20 MG tablet Take 1 tablet (20 mg total) by mouth daily.   No facility-administered encounter medications on file as of 03/25/2016.     Activities of Daily Living In your present state of health, do you have any difficulty performing the following activities: 03/25/2016 01/14/2016  Hearing? N N  Vision? N N  Difficulty concentrating or making decisions? Y N  Walking or climbing stairs? N N  Dressing or bathing? N N  Doing errands, shopping? N -  Preparing Food and eating ? N -  Using the Toilet? N -  In the past six months, have you accidently leaked urine? N -  Do you have problems with loss of bowel control? N -  Managing your Medications? N -  Managing your Finances? N -  Housekeeping or managing your Housekeeping? 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 exercise          Starting 03/25/16, I will start walking 15 minutes twice a week.      Fall Risk Fall Risk  03/25/2016 10/08/2015  Falls in the past  year? No No   Depression Screen PHQ 2/9 Scores 03/25/2016 10/08/2015  PHQ - 2 Score 0 0     Cognitive Function     6CIT Screen 03/25/2016  What Year? 0 points  What month? 0 points  What time? 0 points  Count back from 20 4 points  Months in reverse 4 points  Repeat phrase 4 points  Total Score 12    Immunization History  Administered Date(s) Administered  . Influenza, High Dose Seasonal PF 02/18/2015  . Influenza,inj,quad, With Preservative 03/05/2016  . Pneumococcal Conjugate-13 09/25/2014  . Pneumococcal Polysaccharide-23 04/03/2010  . Zoster 04/09/2011   Screening Tests Health Maintenance  Topic Date Due  . Hepatitis C Screening  09/25/2016 (Originally November 10, 1945)  . MAMMOGRAM  11/04/2017  . TETANUS/TDAP  12/26/2017  . COLONOSCOPY  11/07/2025  . INFLUENZA VACCINE  Completed  . DEXA SCAN  Completed  . ZOSTAVAX  Completed  . PNA vac Low Risk Adult  Completed      Plan:  I have personally reviewed and addressed the Medicare Annual Wellness questionnaire and have noted the following in the patient's chart:  A. Medical and social history B. Use of alcohol, tobacco or illicit drugs  C. Current medications and supplements D. Functional ability and status E.  Nutritional status F.  Physical activity G. Advance directives H. List of other physicians I.  Hospitalizations, surgeries, and ER visits in previous 12 months J.  [place] such as hearing and vision if needed, cognitive and  depression L. Referrals and appointments - none  In addition, I have reviewed and discussed with patient certain preventive protocols, quality metrics, and best practice recommendations. A written personalized care plan for preventive services as well as general preventive health recommendations were provided to patient.  See attached scanned questionnaire for additional information.   Signed,  Fabio Neighbors, LPN Nurse Health Advisor  MD Recommendations: Follow up on Hepatitis C screening at [facility]. Will need to check old chart to review Hep C screeing.  I have reviewed the health advisors note, was  available for consultation and I agree with documentation and plan. Miguel Aschoff MD [facility]

## 2016-03-31 DIAGNOSIS — I10 Essential (primary) hypertension: Secondary | ICD-10-CM | POA: Diagnosis not present

## 2016-03-31 DIAGNOSIS — E871 Hypo-osmolality and hyponatremia: Secondary | ICD-10-CM | POA: Diagnosis not present

## 2016-03-31 DIAGNOSIS — R809 Proteinuria, unspecified: Secondary | ICD-10-CM | POA: Diagnosis not present

## 2016-04-22 ENCOUNTER — Encounter: Payer: Self-pay | Admitting: Hematology and Oncology

## 2016-04-28 ENCOUNTER — Encounter: Payer: Self-pay | Admitting: *Deleted

## 2016-04-28 NOTE — Progress Notes (Signed)
  Oncology Nurse Navigator Documentation  Navigator Location: [facility] (04/28/16 1500)   )Navigator Encounter Type: Letter/Fax/Email (04/28/16 1500)    Thinking of you card mailed to patient. She is currently taking Tamoxifen.  She is to call with any questions or needs.     Time Spent with Patient: 15 (04/28/16 1500)

## 2016-05-12 ENCOUNTER — Other Ambulatory Visit: Payer: Self-pay | Admitting: Hematology and Oncology

## 2016-05-12 DIAGNOSIS — I10 Essential (primary) hypertension: Secondary | ICD-10-CM | POA: Diagnosis not present

## 2016-05-12 DIAGNOSIS — R809 Proteinuria, unspecified: Secondary | ICD-10-CM | POA: Diagnosis not present

## 2016-05-12 DIAGNOSIS — N183 Chronic kidney disease, stage 3 (moderate): Secondary | ICD-10-CM | POA: Diagnosis not present

## 2016-05-12 DIAGNOSIS — E871 Hypo-osmolality and hyponatremia: Secondary | ICD-10-CM | POA: Diagnosis not present

## 2016-06-09 ENCOUNTER — Inpatient Hospital Stay (HOSPITAL_BASED_OUTPATIENT_CLINIC_OR_DEPARTMENT_OTHER): Payer: PPO | Admitting: Hematology and Oncology

## 2016-06-09 ENCOUNTER — Inpatient Hospital Stay: Payer: PPO | Attending: Hematology and Oncology

## 2016-06-09 ENCOUNTER — Encounter: Payer: Self-pay | Admitting: Hematology and Oncology

## 2016-06-09 VITALS — BP 103/67 | HR 65 | Temp 95.9°F | Resp 18 | Wt 147.7 lb

## 2016-06-09 DIAGNOSIS — K219 Gastro-esophageal reflux disease without esophagitis: Secondary | ICD-10-CM | POA: Insufficient documentation

## 2016-06-09 DIAGNOSIS — Z79810 Long term (current) use of selective estrogen receptor modulators (SERMs): Secondary | ICD-10-CM

## 2016-06-09 DIAGNOSIS — D649 Anemia, unspecified: Secondary | ICD-10-CM | POA: Diagnosis not present

## 2016-06-09 DIAGNOSIS — Z170 Estrogen receptor positive status [ER+]: Secondary | ICD-10-CM | POA: Diagnosis not present

## 2016-06-09 DIAGNOSIS — F419 Anxiety disorder, unspecified: Secondary | ICD-10-CM | POA: Diagnosis not present

## 2016-06-09 DIAGNOSIS — Z9012 Acquired absence of left breast and nipple: Secondary | ICD-10-CM | POA: Diagnosis not present

## 2016-06-09 DIAGNOSIS — E871 Hypo-osmolality and hyponatremia: Secondary | ICD-10-CM | POA: Insufficient documentation

## 2016-06-09 DIAGNOSIS — F17210 Nicotine dependence, cigarettes, uncomplicated: Secondary | ICD-10-CM

## 2016-06-09 DIAGNOSIS — I10 Essential (primary) hypertension: Secondary | ICD-10-CM | POA: Insufficient documentation

## 2016-06-09 DIAGNOSIS — C50412 Malignant neoplasm of upper-outer quadrant of left female breast: Secondary | ICD-10-CM | POA: Diagnosis not present

## 2016-06-09 LAB — CBC WITH DIFFERENTIAL/PLATELET
Basophils Absolute: 0.1 10*3/uL (ref 0–0.1)
Basophils Relative: 1 %
Eosinophils Absolute: 0.2 10*3/uL (ref 0–0.7)
Eosinophils Relative: 4 %
HCT: 29.3 % — ABNORMAL LOW (ref 35.0–47.0)
Hemoglobin: 10.3 g/dL — ABNORMAL LOW (ref 12.0–16.0)
Lymphocytes Relative: 17 %
Lymphs Abs: 0.8 10*3/uL — ABNORMAL LOW (ref 1.0–3.6)
MCH: 34.2 pg — ABNORMAL HIGH (ref 26.0–34.0)
MCHC: 35.1 g/dL (ref 32.0–36.0)
MCV: 97.4 fL (ref 80.0–100.0)
Monocytes Absolute: 0.8 10*3/uL (ref 0.2–0.9)
Monocytes Relative: 17 %
Neutro Abs: 3.1 10*3/uL (ref 1.4–6.5)
Neutrophils Relative %: 61 %
Platelets: 228 10*3/uL (ref 150–440)
RBC: 3.01 MIL/uL — ABNORMAL LOW (ref 3.80–5.20)
RDW: 13.9 % (ref 11.5–14.5)
WBC: 5 10*3/uL (ref 3.6–11.0)

## 2016-06-09 LAB — COMPREHENSIVE METABOLIC PANEL
ALT: 13 U/L — ABNORMAL LOW (ref 14–54)
AST: 23 U/L (ref 15–41)
Albumin: 3.9 g/dL (ref 3.5–5.0)
Alkaline Phosphatase: 33 U/L — ABNORMAL LOW (ref 38–126)
Anion gap: 7 (ref 5–15)
BUN: 18 mg/dL (ref 6–20)
CO2: 24 mmol/L (ref 22–32)
Calcium: 9 mg/dL (ref 8.9–10.3)
Chloride: 97 mmol/L — ABNORMAL LOW (ref 101–111)
Creatinine, Ser: 1.1 mg/dL — ABNORMAL HIGH (ref 0.44–1.00)
GFR calc Af Amer: 57 mL/min — ABNORMAL LOW (ref >60)
GFR calc non Af Amer: 49 mL/min — ABNORMAL LOW (ref >60)
Glucose, Bld: 105 mg/dL — ABNORMAL HIGH (ref 65–99)
Potassium: 3.8 mmol/L (ref 3.5–5.1)
Sodium: 128 mmol/L — ABNORMAL LOW (ref 135–145)
Total Bilirubin: 0.4 mg/dL (ref 0.3–1.2)
Total Protein: 7.9 g/dL (ref 6.5–8.1)

## 2016-06-09 LAB — IRON AND TIBC
Iron: 66 ug/dL (ref 28–170)
Saturation Ratios: 21 % (ref 10.4–31.8)
TIBC: 319 ug/dL (ref 250–450)
UIBC: 253 ug/dL

## 2016-06-09 LAB — RETICULOCYTES
RBC.: 3.08 MIL/uL — ABNORMAL LOW (ref 3.80–5.20)
Retic Count, Absolute: 30.8 10*3/uL (ref 19.0–183.0)
Retic Ct Pct: 1 % (ref 0.4–3.1)

## 2016-06-09 LAB — TSH: TSH: 1.241 u[IU]/mL (ref 0.350–4.500)

## 2016-06-09 LAB — FOLATE: Folate: 15.3 ng/mL (ref >5.9)

## 2016-06-09 LAB — FERRITIN: Ferritin: 64 ng/mL (ref 11–307)

## 2016-06-09 MED ORDER — TAMOXIFEN CITRATE 20 MG PO TABS: 20.0000 mg | ORAL_TABLET | Freq: Every day | ORAL | 3 refills | Status: DC

## 2016-06-09 NOTE — Progress Notes (Signed)
[facility] day:  06/09/2016   Chief Complaint: Bianca Shaw is a 71 y.o. female with stage IIA left breast cancer who is seen for 3 month assessment on tamoxifen.  HPI:  The patient was last seen in the medical oncology clinic on 03/10/2016.  At that time, she was was doing well after her first month of tamoxifen.  Liver function tests were normal.  During the interim, she has done well.  She is continuing tamoxifen.  She denies any breast concerns.  Regarding her diet, she eats everything.  She eats meat 2x/week.  She denies any melena, hematochezia, hematuria or vaginal bleeding.  She had a colonoscopy 1 year ago.   Past Medical History:  Diagnosis Date  . Anemia   . Anxiety disorder   . Breast cancer of upper-outer quadrant of left female breast ([place]) 11/2015   pT2 pN0(i+).;ER+; PR +, her 2 neu not overexpressed.  Mastectomy, SLN, Mammoprint: Low risk.   . Cancer ([place]) 12/03/2015   left breast/ INVASIVE LOBULAR CARCINOMA.   . Cough    lingering, mild, finished Prednisone and anitbiotic 11/03/15  . Family history of adverse reaction to anesthesia    sister - PONV  . GERD (gastroesophageal reflux disease)   . H/O: hysterectomy   . Hypertension   . Osteopenia   . Osteoporosis   . Pericarditis    diagnonsed June, 2010, unclear etiology as of yer  . Personal history of tobacco use, presenting hazards to health 10/31/2015  . Scleroderma (HCC)    ONLY ON SKIN-MILD  . UTI (lower urinary tract infection)   . Wears dentures    full upper    Past Surgical History:  Procedure Laterality Date  . BREAST BIOPSY Right 2012   core - neg  . BREAST BIOPSY Left 12/03/2015   INVASIVE LOBULAR CARCINOMA.   Marland Kitchen CATARACT EXTRACTION W/ INTRAOCULAR LENS IMPLANT Right   . COLONOSCOPY WITH PROPOFOL N/A 11/08/2015   Procedure: COLONOSCOPY WITH PROPOFOL;  Surgeon: Lucilla Lame, MD;  Location: [facility];  Service: Endoscopy;  Laterality: N/A;  .  EVACUATION BREAST HEMATOMA Left 01/14/2016   Procedure: EVACUATION HEMATOMA BREAST;  Surgeon: Robert Bellow, MD;  Location: [facility];  Service: General;  Laterality: Left;  Marland Kitchen MASTECTOMY W/ SENTINEL NODE BIOPSY Left 12/26/2015   Procedure: MASTECTOMY WITH SENTINEL LYMPH NODE BIOPSY;  Surgeon: Robert Bellow, MD;  Location: [facility];  Service: General;  Laterality: Left;  . TUBAL LIGATION    . VESICOVAGINAL FISTULA CLOSURE W/ TAH      Family History  Problem Relation Age of Onset  . Heart failure Mother   . Epilepsy Mother   . COPD Father   . Heart disease Father   . Anxiety disorder Sister   . Arthritis Brother   . Heart disease Brother   . Vaginal cancer Paternal Grandmother   . Heart attack Paternal Grandfather   . COPD Brother   . Kidney failure Brother   . COPD Brother   . Arthritis Sister   . Uterine cancer    . Diabetes    . Colon cancer Neg Hx   . Stomach cancer Neg Hx   . Breast cancer Neg Hx     Social History:  reports that she quit smoking about 12 years ago. Her smoking use included Cigarettes. She has a 30.00 pack-year smoking history. She has never used smokeless tobacco. She reports that she drinks about 4.2 oz of  alcohol per week . She reports that she does not use drugs.  She has 5 brothers and 2 sisters.  She has 2 children who are alive and well.  She lives in [place] with her husband, Pat Patrick.  The patient is accompanied by her husband today.  Allergies:  Allergies  Allergen Reactions  . Diuretic  [Buchu-Cornsilk-Ch Grass-Hydran]     hyponatremia  . Furosemide     hives  . Other     SSRI---hyponatremia  . Pimenta Nausea And Vomiting    Current Medications: Current Outpatient Prescriptions  Medication Sig Dispense Refill  . albuterol (PROVENTIL) (2.5 MG/3ML) 0.083% nebulizer solution Take 3 mLs (2.5 mg total) by nebulization every 6 (six) hours as needed for wheezing or shortness of breath. 75 mL 12  . alendronate (FOSAMAX) 70 MG tablet Take 1 tablet  (70 mg total) by mouth every 7 (seven) days. Take with a full glass of water on an empty stomach. 4 tablet 11  . ALPRAZolam (XANAX) 0.5 MG tablet Take 1 tablet (0.5 mg total) by mouth every morning. 1/2 tablet twice daily as needed 30 tablet 3  . amLODipine-olmesartan (AZOR) 10-40 MG tablet Take 1 tablet by mouth as needed.     Marland Kitchen aspirin 81 MG tablet Take by mouth.    . busPIRone (BUSPAR) 5 MG tablet 1 Tablet, Oral QHS 60 tablet 2  . Calcium-Magnesium-Vitamin D (CALCIUM MAGNESIUM PO) Take by mouth.     . cetirizine (ZYRTEC) 10 MG tablet Take 10 mg by mouth as needed.     . cholecalciferol (VITAMIN D) 1000 units tablet Take 1,000 Units by mouth daily.    . fexofenadine (ALLEGRA) 180 MG tablet Take 180 mg by mouth daily.    . fluticasone (FLONASE) 50 MCG/ACT nasal spray Place 2 sprays into the nose as needed.     . Glucosamine-Chondroit-Vit C-Mn (GLUCOSAMINE 1500 COMPLEX PO) Take by mouth.    Marland Kitchen ibuprofen (ADVIL,MOTRIN) 200 MG tablet Take 200 mg by mouth every 6 (six) hours as needed.      . loratadine (CLARITIN) 10 MG tablet Take 10 mg by mouth daily.    . metoprolol succinate (TOPROL-XL) 25 MG 24 hr tablet Take 1 tablet (25 mg total) by mouth daily. (Patient taking differently: Take 25 mg by mouth every morning. ) 30 tablet 12  . Omega-3 Fatty Acids (FISH OIL) 1000 MG CAPS Take 1 capsule by mouth.    Marland Kitchen omeprazole (PRILOSEC) 20 MG capsule Take 20 mg by mouth as needed.     . psyllium (METAMUCIL SMOOTH TEXTURE) 28 % packet Take 1 packet by mouth 2 (two) times daily.    . tamoxifen (NOLVADEX) 20 MG tablet Take 1 tablet (20 mg total) by mouth daily. 90 tablet 3   No current facility-administered medications for this visit.     Review of Systems:  GENERAL:  Feels good.  No fevers or sweats.  Weight down 3 pounds. PERFORMANCE STATUS (ECOG): 0 HEENT:  No visual changes, runny nose, sore throat, mouth sores or tenderness. Lungs: No shortness of breath or cough.  No hemoptysis. Cardiac:  No chest  pain, palpitations, orthopnea, or PND. GI:  No nausea, vomiting, diarrhea, constipation, melena or hematochezia.  Colonoscopy done 09/2015. GU:  No urgency, frequency, dysuria, or hematuria. Musculoskeletal:  Osteoporosis on Fosamax.  No back pain.  No joint pain.  No muscle tenderness. Extremities:  No pain or swelling. Skin:  Skin changes due to scleroderma.  Healing well s/p left mastectomy.  No rashes  or skin changes. Neuro:  No headache, numbness or weakness, balance or coordination issues. Endocrine:  No diabetes, thyroid issues, hot flashes or night sweats. Psych:  No mood changes, depression or anxiety. Pain:  No focal pain. Review of systems:  All other systems reviewed and found to be negative.  Physical Exam: Blood pressure 103/67, pulse 65, temperature (!) 95.9 F (35.5 C), temperature source Tympanic, resp. rate 18, weight 147 lb 11.3 oz (67 kg). GENERAL:  Well developed, well nourished, woman sitting comfortably in the exam room in no acute distress. MENTAL STATUS:  Alert and oriented to person, place and time. HEAD:  Short gray hair.  Normocephalic, atraumatic, face symmetric, no Cushingoid features. EYES:  Blue eyes.  Pupils equal round and reactive to light and accomodation.  No conjunctivitis or scleral icterus. ENT:  Oropharynx clear without lesion.  Tongue normal. Mucous membranes moist.  RESPIRATORY:  Clear to auscultation without rales, wheezes or rhonchi. CARDIOVASCULAR:  Regular rate and rhythm without murmur, rub or gallop. BREAST:  Right breast without masses, skin changes or nipple discharge.  Left sided mastectomy well healed. ABDOMEN:  Soft, non-tender, with active bowel sounds, and no hepatosplenomegaly.  No masses. SKIN:  No rashes, ulcers or lesions. EXTREMITIES: No edema, no skin discoloration or tenderness.  No palpable cords. LYMPH NODES: No palpable cervical, supraclavicular, axillary or inguinal adenopathy  NEUROLOGICAL: Unremarkable. PSYCH:   Appropriate.   Appointment on 06/09/2016  Component Date Value Ref Range Status  . WBC 06/09/2016 5.0  3.6 - 11.0 K/uL Final  . RBC 06/09/2016 3.01* 3.80 - 5.20 MIL/uL Final  . Hemoglobin 06/09/2016 10.3* 12.0 - 16.0 g/dL Final  . HCT 06/09/2016 29.3* 35.0 - 47.0 % Final  . MCV 06/09/2016 97.4  80.0 - 100.0 fL Final  . MCH 06/09/2016 34.2* 26.0 - 34.0 pg Final  . MCHC 06/09/2016 35.1  32.0 - 36.0 g/dL Final  . RDW 06/09/2016 13.9  11.5 - 14.5 % Final  . Platelets 06/09/2016 228  150 - 440 K/uL Final  . Neutrophils Relative % 06/09/2016 61  % Final  . Neutro Abs 06/09/2016 3.1  1.4 - 6.5 K/uL Final  . Lymphocytes Relative 06/09/2016 17  % Final  . Lymphs Abs 06/09/2016 0.8* 1.0 - 3.6 K/uL Final  . Monocytes Relative 06/09/2016 17  % Final  . Monocytes Absolute 06/09/2016 0.8  0.2 - 0.9 K/uL Final  . Eosinophils Relative 06/09/2016 4  % Final  . Eosinophils Absolute 06/09/2016 0.2  0 - 0.7 K/uL Final  . Basophils Relative 06/09/2016 1  % Final  . Basophils Absolute 06/09/2016 0.1  0 - 0.1 K/uL Final  . Sodium 06/09/2016 128* 135 - 145 mmol/L Final  . Potassium 06/09/2016 3.8  3.5 - 5.1 mmol/L Final  . Chloride 06/09/2016 97* 101 - 111 mmol/L Final  . CO2 06/09/2016 24  22 - 32 mmol/L Final  . Glucose, Bld 06/09/2016 105* 65 - 99 mg/dL Final  . BUN 06/09/2016 18  6 - 20 mg/dL Final  . Creatinine, Ser 06/09/2016 1.10* 0.44 - 1.00 mg/dL Final  . Calcium 06/09/2016 9.0  8.9 - 10.3 mg/dL Final  . Total Protein 06/09/2016 7.9  6.5 - 8.1 g/dL Final  . Albumin 06/09/2016 3.9  3.5 - 5.0 g/dL Final  . AST 06/09/2016 23  15 - 41 U/L Final  . ALT 06/09/2016 13* 14 - 54 U/L Final  . Alkaline Phosphatase 06/09/2016 33* 38 - 126 U/L Final  . Total Bilirubin 06/09/2016 0.4  0.3 - 1.2 mg/dL Final  . GFR calc non Af Amer 06/09/2016 49* >60 mL/min Final  . GFR calc Af Amer 06/09/2016 57* >60 mL/min Final   Comment: (NOTE) The eGFR has been calculated using the CKD EPI equation. This calculation  has not been validated in all clinical situations. eGFR's persistently <60 mL/min signify possible Chronic Kidney Disease.   . Anion gap 06/09/2016 7  5 - 15 Final    Assessment:  Bianca Shaw is a 71 y.o. female with stage IIA (T2N0) left breast cancer s/p mastectomy with sentinel lymph node biopsy on 12/26/2015.   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  Margins were negative.  Two sentinel lymph nodes were negative for macrometastasis, but with isolated tumor cells on IHC stains.  Three additional lymph nodes were positive for isolated tumor cells on IHC.  Tumor was ER positive (> 90%), PR positive (> 90%), and Her2/neu 2+ (eqivocal).  Her2/neu by FISH was negative.  Pathologic stage was pT2 pN0(i+).  MammaPrint testing revealed low risk luminal type A. There was a 97.8% probability of being disease free at 10 years with hormonal therapy.   Left mammogram and ultrasound on 11/21/2015 revealed a 4.2 x 2.4 x 2.6 cm irregular hypoechoic mass in the 1-2 o'clock position in the left breast 3 cm from the nipple.  Left axillary lymph nodes appeared normal.  Breast MRI on 12/25/2015 revealed a 6.2 cm area of abnormal enhancement in the upper outer quadrant of the left breast.  The right breast revealed no mass or abnormal enhancement.  She began tamoxifen on 02/04/2016.  She is tolerating it well.  Bone density on 11/05/2015 revealed osteoporosis with a T score of -2.9 in the AP spine L1-L2 and -1.9 in the left femoral neck.  She started Fosamax in 10/2015.  She is on calcium and vitamin D.  She has a normocytic anemia.  Hematocrit has ranged between 32-24 since 11/2015.  Diet is fair.  She had a colonoscopy 1 year ago.  She denies any melena, hematochezia, hematuria or vaginal bleeding.  She has scleroderma.  She has skin thickening at sites of abrasion.  She has a history of chronic hyponatremia.  She is on fluid  restriction.  Symptomatically, she denies any complaints.  Exam is stable.  Plan: 1.  Labs today:  CBC with diff, CMP, CA27.29. 2.  Discuss normocytic anemia and work-up. 3.  Add labs: ferritin, iron studies, B12, folate, TSH, retic. 4.  Continue tamoxifen. 5.  Continue Fosamax, calcium and vitamin D. 6.  RTC in 3 months for MD assessment and labs (CBC with diff, CMP, CA27.29).   Lequita Asal, MD  06/09/2016, 10:44 AM

## 2016-06-09 NOTE — Progress Notes (Signed)
Patient's BP elevated today. 163/67 HR 65.

## 2016-06-10 ENCOUNTER — Other Ambulatory Visit: Payer: Self-pay | Admitting: *Deleted

## 2016-06-10 DIAGNOSIS — C50412 Malignant neoplasm of upper-outer quadrant of left female breast: Secondary | ICD-10-CM

## 2016-06-10 LAB — VITAMIN B12: Vitamin B-12: 295 pg/mL (ref 180–914)

## 2016-06-10 LAB — CANCER ANTIGEN 27.29: CA 27.29: 19.4 U/mL (ref 0.0–38.6)

## 2016-06-16 ENCOUNTER — Ambulatory Visit (INDEPENDENT_AMBULATORY_CARE_PROVIDER_SITE_OTHER): Payer: PPO | Admitting: General Surgery

## 2016-06-16 ENCOUNTER — Encounter: Payer: Self-pay | Admitting: General Surgery

## 2016-06-16 VITALS — BP 140/82 | HR 62 | Resp 12 | Ht 61.0 in | Wt 147.0 lb

## 2016-06-16 DIAGNOSIS — C50412 Malignant neoplasm of upper-outer quadrant of left female breast: Secondary | ICD-10-CM

## 2016-06-16 DIAGNOSIS — Z170 Estrogen receptor positive status [ER+]: Secondary | ICD-10-CM

## 2016-06-16 NOTE — Progress Notes (Signed)
Patient ID: Bianca Shaw, female   DOB: 1946/02/05, 71 y.o.   MRN: [id number]  Chief Complaint  Patient presents with  . Follow-up    HPI Bianca Shaw is a 71 y.o. female here today for her follow up breast cancer. No new breast issues. She is tolerating the Tamoxifen.  She is here [place] with her husband, [name].  HPI  Past Medical History:  Diagnosis Date  . Anemia   . Anxiety disorder   . Breast cancer of upper-outer quadrant of left female breast ([place]) 11/2015   pT2 pN0(i+).;ER+; PR +, her 2 neu not overexpressed.  Mastectomy, SLN, Mammoprint: Low risk.   . Cancer ([place]) 12/03/2015   left breast/ INVASIVE LOBULAR CARCINOMA.   . Cough    lingering, mild, finished Prednisone and anitbiotic 11/03/15  . Family history of adverse reaction to anesthesia    sister - PONV  . GERD (gastroesophageal reflux disease)   . H/O: hysterectomy   . Hypertension   . Osteopenia   . Osteoporosis   . Pericarditis    diagnonsed June, 2010, unclear etiology as of yer  . Personal history of tobacco use, presenting hazards to health 10/31/2015  . Scleroderma (HCC)    ONLY ON SKIN-MILD  . UTI (lower urinary tract infection)   . Wears dentures    full upper    Past Surgical History:  Procedure Laterality Date  . BREAST BIOPSY Right 2012   core - neg  . BREAST BIOPSY Left 12/03/2015   INVASIVE LOBULAR CARCINOMA.   Marland Kitchen CATARACT EXTRACTION W/ INTRAOCULAR LENS IMPLANT Right   . COLONOSCOPY WITH PROPOFOL N/A 11/08/2015   Procedure: COLONOSCOPY WITH PROPOFOL;  Surgeon: Lucilla Lame, MD;  Location: [facility];  Service: Endoscopy;  Laterality: N/A;  . EVACUATION BREAST HEMATOMA Left 01/14/2016   Procedure: EVACUATION HEMATOMA BREAST;  Surgeon: Robert Bellow, MD;  Location: [facility];  Service: General;  Laterality: Left;  Marland Kitchen MASTECTOMY W/ SENTINEL NODE BIOPSY Left 12/26/2015   Procedure: MASTECTOMY WITH SENTINEL LYMPH NODE BIOPSY;  Surgeon: Robert Bellow, MD;  Location: [facility];   Service: General;  Laterality: Left;  . TUBAL LIGATION    . VESICOVAGINAL FISTULA CLOSURE W/ TAH      Family History  Problem Relation Age of Onset  . Heart failure Mother   . Epilepsy Mother   . COPD Father   . Heart disease Father   . Anxiety disorder Sister   . Arthritis Brother   . Heart disease Brother   . Vaginal cancer Paternal Grandmother   . Heart attack Paternal Grandfather   . COPD Brother   . Kidney failure Brother   . COPD Brother   . Arthritis Sister   . Uterine cancer    . Diabetes    . Colon cancer Neg Hx   . Stomach cancer Neg Hx   . Breast cancer Neg Hx     Social History Social History  Substance Use Topics  . Smoking status: Former Smoker    Packs/day: 0.75    Years: 40.00    Types: Cigarettes    Quit date: 11/07/2003  . Smokeless tobacco: Never Used  . Alcohol use 4.2 oz/week    7 Glasses of wine per week     Comment: 1-3 a day WINE    Allergies  Allergen Reactions  . Diuretic  [Buchu-Cornsilk-Ch Grass-Hydran]     hyponatremia  . Furosemide     hives  . Other  SSRI---hyponatremia  . Pimenta Nausea And Vomiting    Current Outpatient Prescriptions  Medication Sig Dispense Refill  . albuterol (PROVENTIL) (2.5 MG/3ML) 0.083% nebulizer solution Take 3 mLs (2.5 mg total) by nebulization every 6 (six) hours as needed for wheezing or shortness of breath. 75 mL 12  . alendronate (FOSAMAX) 70 MG tablet Take 1 tablet (70 mg total) by mouth every 7 (seven) days. Take with a full glass of water on an empty stomach. 4 tablet 11  . ALPRAZolam (XANAX) 0.5 MG tablet Take 1 tablet (0.5 mg total) by mouth every morning. 1/2 tablet twice daily as needed 30 tablet 3  . amLODipine-olmesartan (AZOR) 10-40 MG tablet Take 1 tablet by mouth as needed.     Marland Kitchen aspirin 81 MG tablet Take by mouth.    . busPIRone (BUSPAR) 5 MG tablet 1 Tablet, Oral QHS 60 tablet 2  . Calcium-Magnesium-Vitamin D (CALCIUM MAGNESIUM PO) Take by mouth.     . cetirizine (ZYRTEC) 10 MG  tablet Take 10 mg by mouth as needed.     . cholecalciferol (VITAMIN D) 1000 units tablet Take 1,000 Units by mouth daily.    . fexofenadine (ALLEGRA) 180 MG tablet Take 180 mg by mouth daily.    . fluticasone (FLONASE) 50 MCG/ACT nasal spray Place 2 sprays into the nose as needed.     . Glucosamine-Chondroit-Vit C-Mn (GLUCOSAMINE 1500 COMPLEX PO) Take by mouth.    Marland Kitchen ibuprofen (ADVIL,MOTRIN) 200 MG tablet Take 200 mg by mouth every 6 (six) hours as needed.      . loratadine (CLARITIN) 10 MG tablet Take 10 mg by mouth daily.    . metoprolol succinate (TOPROL-XL) 25 MG 24 hr tablet Take 1 tablet (25 mg total) by mouth daily. (Patient taking differently: Take 25 mg by mouth every morning. ) 30 tablet 12  . Omega-3 Fatty Acids (FISH OIL) 1000 MG CAPS Take 1 capsule by mouth.    Marland Kitchen omeprazole (PRILOSEC) 20 MG capsule Take 20 mg by mouth as needed.     . psyllium (METAMUCIL SMOOTH TEXTURE) 28 % packet Take 1 packet by mouth 2 (two) times daily.    . tamoxifen (NOLVADEX) 20 MG tablet Take 1 tablet (20 mg total) by mouth daily. 90 tablet 3   No current facility-administered medications for this visit.     Review of Systems Review of Systems  Constitutional: Negative.   Respiratory: Negative.   Cardiovascular: Negative.     Blood pressure 140/82, pulse 62, resp. rate 12, height 5\' 1"  (1.549 m), weight 147 lb (66.7 kg), SpO2 98 %.  Physical Exam Physical Exam  Constitutional: She is oriented to person, place, and time. She appears well-developed and well-nourished.  HENT:  Mouth/Throat: Oropharynx is clear and moist.  Eyes: Conjunctivae are normal. No scleral icterus.  Neck: Neck supple.  Cardiovascular: Normal rate, regular rhythm and normal heart sounds.   Pulmonary/Chest: Effort normal and breath sounds normal. Right breast exhibits no inverted nipple, no mass, no nipple discharge, no skin change and no tenderness.    Left mastectomy site well healed.  Musculoskeletal:        Arms: Lymphadenopathy:    She has no cervical adenopathy.  Neurological: She is alert and oriented to person, place, and time.  Skin: Skin is warm and dry.  Psychiatric: Her behavior is normal.    Data Reviewed Medical oncology note of 06/09/2016 reviewed.  Tamoxifen chosen for antiestrogen therapy in October 2017 based on the patient's history of osteoporosis.  Assessment    Doing well now 6 months status post mastectomy for stage II a invasive lobular carcinoma the left breast with multiple lymph nodes involved with isolated tumor cells. Low Mammoprint score.  No objective evidence of lymphedema.    Plan        May restart Aspirin 81 mg daily. The patient has been asked to return to the office in 6 months with a unilateral right breast screening mammogram.   This information has been scribed by Karie Fetch RN, BSN,BC.    Robert Bellow 06/16/2016, 12:41 PM

## 2016-06-16 NOTE — Patient Instructions (Addendum)
The patient is aware to call back for any questions or concerns. The patient has been asked to return to the office in 6 months with a unilateral right breast screening mammogram  May restart Aspirin 81 mg daily.

## 2016-07-01 ENCOUNTER — Other Ambulatory Visit: Payer: Self-pay | Admitting: *Deleted

## 2016-07-01 DIAGNOSIS — D508 Other iron deficiency anemias: Secondary | ICD-10-CM

## 2016-07-15 ENCOUNTER — Inpatient Hospital Stay: Payer: PPO

## 2016-07-15 ENCOUNTER — Encounter: Payer: Self-pay | Admitting: Physician Assistant

## 2016-07-15 ENCOUNTER — Ambulatory Visit (INDEPENDENT_AMBULATORY_CARE_PROVIDER_SITE_OTHER): Payer: PPO | Admitting: Physician Assistant

## 2016-07-15 VITALS — BP 160/70 | HR 91 | Temp 98.3°F | Resp 16

## 2016-07-15 DIAGNOSIS — A084 Viral intestinal infection, unspecified: Secondary | ICD-10-CM

## 2016-07-15 DIAGNOSIS — R112 Nausea with vomiting, unspecified: Secondary | ICD-10-CM | POA: Diagnosis not present

## 2016-07-15 DIAGNOSIS — R9431 Abnormal electrocardiogram [ECG] [EKG]: Secondary | ICD-10-CM

## 2016-07-15 DIAGNOSIS — I517 Cardiomegaly: Secondary | ICD-10-CM | POA: Diagnosis not present

## 2016-07-15 DIAGNOSIS — R0789 Other chest pain: Secondary | ICD-10-CM

## 2016-07-15 MED ORDER — PROMETHAZINE HCL 25 MG/ML IJ SOLN
25.0000 mg | Freq: Once | INTRAMUSCULAR | Status: AC
  Administered 2016-07-15: 25 mg via INTRAMUSCULAR

## 2016-07-15 MED ORDER — PROMETHAZINE HCL 12.5 MG PO TABS: 12.5000 mg | ORAL_TABLET | Freq: Three times a day (TID) | ORAL | 0 refills | Status: DC | PRN

## 2016-07-15 NOTE — Progress Notes (Addendum)
Patient: Bianca Shaw Female    DOB: Jul 28, 1945   71 y.o.   MRN: [id number] Visit Date: 07/15/2016  Today's Provider: Trinna Post, PA-C   Chief Complaint  Patient presents with  . Diarrhea  . Emesis  . Fever   Subjective:    HPI Patient is a 71 year old woman with hx of HTN, chronic airflow limitation, and breast cancer on Tamoxifen who is here today with c/o of diarrhea, vomiting and fever. She started on Sunday with the vomiting. She had a temperature on Monday of 100.9.She started with the diarrhea today. There is no blood in her vomit or stool. She is still urinating. She is having some generalized abdominal cramping but no focal abdominal pain. She vomits about every 3-4 hours. The last time she ate was yesterday. She did not take her blood pressure medication this morning because of nausea/vomiting.  She is not eating, she feels lightheaded, dizziness, mild headache, chest discomfort with deep breath, fatigue,aching all over,and SOB. She notices her chest discomfort when she takes a very deep breath. She is not having any diaphoresis, radiation of pain to her arm or jaw, no palpitations.  She is pushing fluids and going to the urinate at least 3 times a day. Patient was around her daughter, grandson and granddaughter with similar symptoms.  She tried Pepto bismol, Fluids, She also took one promethazine 25MG  last night and one this morning. This is her husband's nausea medication    Allergies  Allergen Reactions  . Diuretic  [Buchu-Cornsilk-Ch Grass-Hydran]     hyponatremia  . Furosemide     hives  . Other     SSRI---hyponatremia  . Pimenta Nausea And Vomiting     Current Outpatient Prescriptions:  .  ALPRAZolam (XANAX) 0.5 MG tablet, Take 1 tablet (0.5 mg total) by mouth every morning. 1/2 tablet twice daily as needed, Disp: 30 tablet, Rfl: 3 .  amLODipine-olmesartan (AZOR) 10-40 MG tablet, Take 1 tablet by mouth as needed. , Disp: , Rfl:  .  aspirin 81  MG tablet, Take by mouth., Disp: , Rfl:  .  busPIRone (BUSPAR) 5 MG tablet, 1 Tablet, Oral QHS, Disp: 60 tablet, Rfl: 2 .  Calcium-Magnesium-Vitamin D (CALCIUM MAGNESIUM PO), Take by mouth. , Disp: , Rfl:  .  cholecalciferol (VITAMIN D) 1000 units tablet, Take 1,000 Units by mouth daily., Disp: , Rfl:  .  fluticasone (FLONASE) 50 MCG/ACT nasal spray, Place 2 sprays into the nose as needed. , Disp: , Rfl:  .  metoprolol succinate (TOPROL-XL) 25 MG 24 hr tablet, Take 1 tablet (25 mg total) by mouth daily. (Patient taking differently: Take 25 mg by mouth every morning. ), Disp: 30 tablet, Rfl: 12 .  Omega-3 Fatty Acids (FISH OIL) 1000 MG CAPS, Take 1 capsule by mouth., Disp: , Rfl:  .  tamoxifen (NOLVADEX) 20 MG tablet, Take 1 tablet (20 mg total) by mouth daily., Disp: 90 tablet, Rfl: 3 .  albuterol (PROVENTIL) (2.5 MG/3ML) 0.083% nebulizer solution, Take 3 mLs (2.5 mg total) by nebulization every 6 (six) hours as needed for wheezing or shortness of breath., Disp: 75 mL, Rfl: 12 .  alendronate (FOSAMAX) 70 MG tablet, Take 1 tablet (70 mg total) by mouth every 7 (seven) days. Take with a full glass of water on an empty stomach., Disp: 4 tablet, Rfl: 11 .  cetirizine (ZYRTEC) 10 MG tablet, Take 10 mg by mouth as needed. , Disp: , Rfl:  .  fexofenadine (ALLEGRA) 180 MG tablet, Take 180 mg by mouth daily., Disp: , Rfl:  .  Glucosamine-Chondroit-Vit C-Mn (GLUCOSAMINE 1500 COMPLEX PO), Take by mouth., Disp: , Rfl:  .  ibuprofen (ADVIL,MOTRIN) 200 MG tablet, Take 200 mg by mouth every 6 (six) hours as needed.  , Disp: , Rfl:  .  loratadine (CLARITIN) 10 MG tablet, Take 10 mg by mouth daily., Disp: , Rfl:  .  omeprazole (PRILOSEC) 20 MG capsule, Take 20 mg by mouth as needed. , Disp: , Rfl:  .  promethazine (PHENERGAN) 12.5 MG tablet, Take 1 tablet (12.5 mg total) by mouth every 8 (eight) hours as needed for nausea or vomiting., Disp: 30 tablet, Rfl: 0 .  psyllium (METAMUCIL SMOOTH TEXTURE) 28 % packet, Take  1 packet by mouth 2 (two) times daily., Disp: , Rfl:   Review of Systems  Constitutional: Positive for appetite change and fatigue.  HENT: Negative for sore throat.   Respiratory: Positive for chest tightness (when she breath deep) and shortness of breath.   Cardiovascular: Negative for chest pain, palpitations and leg swelling.  Genitourinary: Negative for difficulty urinating.  Musculoskeletal: Positive for arthralgias and myalgias.  Neurological: Positive for dizziness, weakness, light-headedness and headaches. Negative for numbness.    Social History  Substance Use Topics  . Smoking status: Former Smoker    Packs/day: 0.75    Years: 40.00    Types: Cigarettes    Quit date: 11/07/2003  . Smokeless tobacco: Never Used  . Alcohol use 4.2 oz/week    7 Glasses of wine per week     Comment: 1-3 a day WINE   Objective:   BP (!) 160/70 (BP Location: Right Wrist, Patient Position: Sitting, Cuff Size: Normal) Comment: did not take her bp medication today  Pulse 91   Temp 98.3 F (36.8 C) (Oral)   Resp 16   SpO2 97%    Physical Exam  Constitutional: She is oriented to person, place, and time. She appears well-developed and well-nourished. She appears ill.  HENT:  Right Ear: Tympanic membrane and external ear normal.  Left Ear: Tympanic membrane and external ear normal.  Mouth/Throat: Oropharynx is clear and moist and mucous membranes are normal. Mucous membranes are not pale, not dry and not cyanotic. No oropharyngeal exudate.  Eyes: Conjunctivae are normal.  Neck: Neck supple.  Cardiovascular: Normal rate, regular rhythm and normal heart sounds.  Exam reveals no gallop and no friction rub.   No murmur heard. Pulmonary/Chest: Effort normal and breath sounds normal. No respiratory distress. She has no wheezes. She has no rales. She exhibits no tenderness.  Abdominal: Soft. Bowel sounds are normal. She exhibits no distension and no mass. There is no tenderness. There is no rebound  and no guarding.  Lymphadenopathy:    She has no cervical adenopathy.  Neurological: She is alert and oriented to person, place, and time.  Skin: Skin is warm and dry.  Psychiatric: She has a normal mood and affect. Her behavior is normal.        Assessment & Plan:     1. Viral gastroenteritis  Gave phenergan IM in office, husband is driving. Will get labs as below to assess white count and electrolyte balance. Counseled on hydration and s/sx to go to ER. Gave prescription for oral phenergan, cautioned on sedation side effects. Husband driving today.   2. Nausea and vomiting, intractability of vomiting not specified, unspecified vomiting type  See above.  - promethazine (PHENERGAN) injection 25 mg; Inject 1  mL (25 mg total) into the muscle once. - promethazine (PHENERGAN) 12.5 MG tablet; Take 1 tablet (12.5 mg total) by mouth every 8 (eight) hours as needed for nausea or vomiting.  Dispense: 30 tablet; Refill: 0 - Comprehensive metabolic panel - CBC with Differential/Platelet  3. Chest tightness  Got EKG in office today. Showed some LVH which was present on EKG from 11/2015. Today's EKG showed some ST elevation in inferior leads. Patient has seen cardiology remotely in 2010, husband thinks he may remember some discussion about enlarged heart. Reviewed EKG with Dr. Caryn Section and Dr. Rosanna Randy, agree that changes likely due yo LV strain, but to further evaluate with labs below. Patient currently on 81 mg ASA.   - EKG 12-Lead  4. Abnormal EKG  See above.   - Troponin I  5. Left ventricular hypertrophy by electrocardiogram  See above.  - B Nat Peptide  Will see patient in office on Tuesday  for follow up. Will contact with results of labs.   The entirety of the information documented in the History of Present Illness, Review of Systems and Physical Exam were personally obtained by me. Portions of this information were initially documented by Jocelin and reviewed by me for  thoroughness and accuracy.   I have spent 25 minutes with this patient, >50% of which was spent on counseling and coordination of care.   Return in about 5 days (around 07/20/2016) for LVH, GI bug.          Trinna Post, PA-C  [facility]

## 2016-07-15 NOTE — Patient Instructions (Signed)

## 2016-07-15 NOTE — Addendum Note (Signed)
Addended by: Trinna Post on: 07/15/2016 11:59 AM   Modules accepted: Level of Service

## 2016-07-16 ENCOUNTER — Encounter: Payer: Self-pay | Admitting: Emergency Medicine

## 2016-07-16 ENCOUNTER — Inpatient Hospital Stay (HOSPITAL_COMMUNITY)
Admit: 2016-07-16 | Discharge: 2016-07-16 | Disposition: A | Discharge status: Home / Self Care | Payer: PPO | Attending: Internal Medicine | Admitting: Internal Medicine

## 2016-07-16 ENCOUNTER — Emergency Department: Payer: PPO

## 2016-07-16 ENCOUNTER — Inpatient Hospital Stay
Admission: EM | Admit: 2016-07-16 | Discharge: 2016-07-17 | DRG: 281 | Disposition: A | Discharge status: Home / Self Care | Payer: PPO | Attending: Internal Medicine | Admitting: Internal Medicine

## 2016-07-16 DIAGNOSIS — D638 Anemia in other chronic diseases classified elsewhere: Secondary | ICD-10-CM | POA: Diagnosis not present

## 2016-07-16 DIAGNOSIS — E663 Overweight: Secondary | ICD-10-CM | POA: Diagnosis present

## 2016-07-16 DIAGNOSIS — K529 Noninfective gastroenteritis and colitis, unspecified: Secondary | ICD-10-CM | POA: Diagnosis present

## 2016-07-16 DIAGNOSIS — E871 Hypo-osmolality and hyponatremia: Secondary | ICD-10-CM

## 2016-07-16 DIAGNOSIS — R0781 Pleurodynia: Secondary | ICD-10-CM

## 2016-07-16 DIAGNOSIS — M349 Systemic sclerosis, unspecified: Secondary | ICD-10-CM | POA: Diagnosis present

## 2016-07-16 DIAGNOSIS — R748 Abnormal levels of other serum enzymes: Secondary | ICD-10-CM

## 2016-07-16 DIAGNOSIS — Z7982 Long term (current) use of aspirin: Secondary | ICD-10-CM

## 2016-07-16 DIAGNOSIS — J309 Allergic rhinitis, unspecified: Secondary | ICD-10-CM | POA: Diagnosis not present

## 2016-07-16 DIAGNOSIS — I429 Cardiomyopathy, unspecified: Secondary | ICD-10-CM | POA: Diagnosis not present

## 2016-07-16 DIAGNOSIS — Z6827 Body mass index (BMI) 27.0-27.9, adult: Secondary | ICD-10-CM | POA: Diagnosis not present

## 2016-07-16 DIAGNOSIS — I214 Non-ST elevation (NSTEMI) myocardial infarction: Principal | ICD-10-CM | POA: Diagnosis present

## 2016-07-16 DIAGNOSIS — K219 Gastro-esophageal reflux disease without esophagitis: Secondary | ICD-10-CM | POA: Diagnosis present

## 2016-07-16 DIAGNOSIS — Z853 Personal history of malignant neoplasm of breast: Secondary | ICD-10-CM | POA: Diagnosis not present

## 2016-07-16 DIAGNOSIS — I10 Essential (primary) hypertension: Secondary | ICD-10-CM | POA: Diagnosis not present

## 2016-07-16 DIAGNOSIS — E876 Hypokalemia: Secondary | ICD-10-CM | POA: Diagnosis not present

## 2016-07-16 DIAGNOSIS — I509 Heart failure, unspecified: Secondary | ICD-10-CM

## 2016-07-16 DIAGNOSIS — Z79899 Other long term (current) drug therapy: Secondary | ICD-10-CM | POA: Diagnosis not present

## 2016-07-16 DIAGNOSIS — Z841 Family history of disorders of kidney and ureter: Secondary | ICD-10-CM

## 2016-07-16 DIAGNOSIS — Z79890 Hormone replacement therapy (postmenopausal): Secondary | ICD-10-CM

## 2016-07-16 DIAGNOSIS — Z8249 Family history of ischemic heart disease and other diseases of the circulatory system: Secondary | ICD-10-CM

## 2016-07-16 DIAGNOSIS — R778 Other specified abnormalities of plasma proteins: Secondary | ICD-10-CM | POA: Diagnosis present

## 2016-07-16 DIAGNOSIS — M810 Age-related osteoporosis without current pathological fracture: Secondary | ICD-10-CM | POA: Diagnosis not present

## 2016-07-16 DIAGNOSIS — E785 Hyperlipidemia, unspecified: Secondary | ICD-10-CM | POA: Diagnosis not present

## 2016-07-16 DIAGNOSIS — Z90710 Acquired absence of both cervix and uterus: Secondary | ICD-10-CM

## 2016-07-16 DIAGNOSIS — Z972 Presence of dental prosthetic device (complete) (partial): Secondary | ICD-10-CM

## 2016-07-16 DIAGNOSIS — R079 Chest pain, unspecified: Secondary | ICD-10-CM

## 2016-07-16 DIAGNOSIS — Z87891 Personal history of nicotine dependence: Secondary | ICD-10-CM

## 2016-07-16 DIAGNOSIS — I25110 Atherosclerotic heart disease of native coronary artery with unstable angina pectoris: Secondary | ICD-10-CM | POA: Diagnosis not present

## 2016-07-16 DIAGNOSIS — I700 Atherosclerosis of aorta: Secondary | ICD-10-CM | POA: Diagnosis not present

## 2016-07-16 DIAGNOSIS — E222 Syndrome of inappropriate secretion of antidiuretic hormone: Secondary | ICD-10-CM | POA: Diagnosis not present

## 2016-07-16 DIAGNOSIS — I129 Hypertensive chronic kidney disease with stage 1 through stage 4 chronic kidney disease, or unspecified chronic kidney disease: Secondary | ICD-10-CM | POA: Diagnosis not present

## 2016-07-16 DIAGNOSIS — F17200 Nicotine dependence, unspecified, uncomplicated: Secondary | ICD-10-CM | POA: Diagnosis not present

## 2016-07-16 DIAGNOSIS — R7989 Other specified abnormal findings of blood chemistry: Secondary | ICD-10-CM

## 2016-07-16 DIAGNOSIS — Z9012 Acquired absence of left breast and nipple: Secondary | ICD-10-CM

## 2016-07-16 DIAGNOSIS — R0600 Dyspnea, unspecified: Secondary | ICD-10-CM

## 2016-07-16 DIAGNOSIS — J449 Chronic obstructive pulmonary disease, unspecified: Secondary | ICD-10-CM | POA: Diagnosis not present

## 2016-07-16 DIAGNOSIS — N183 Chronic kidney disease, stage 3 (moderate): Secondary | ICD-10-CM | POA: Diagnosis not present

## 2016-07-16 DIAGNOSIS — Z961 Presence of intraocular lens: Secondary | ICD-10-CM | POA: Diagnosis not present

## 2016-07-16 DIAGNOSIS — R0602 Shortness of breath: Secondary | ICD-10-CM | POA: Diagnosis not present

## 2016-07-16 DIAGNOSIS — F419 Anxiety disorder, unspecified: Secondary | ICD-10-CM | POA: Diagnosis present

## 2016-07-16 DIAGNOSIS — R809 Proteinuria, unspecified: Secondary | ICD-10-CM | POA: Diagnosis not present

## 2016-07-16 DIAGNOSIS — Z825 Family history of asthma and other chronic lower respiratory diseases: Secondary | ICD-10-CM

## 2016-07-16 DIAGNOSIS — Z888 Allergy status to other drugs, medicaments and biological substances status: Secondary | ICD-10-CM

## 2016-07-16 LAB — COMPREHENSIVE METABOLIC PANEL
ALT: 11 IU/L (ref 0–32)
AST: 34 IU/L (ref 0–40)
Albumin/Globulin Ratio: 1.3 (ref 1.2–2.2)
Albumin: 4 g/dL (ref 3.5–4.8)
Alkaline Phosphatase: 41 IU/L (ref 39–117)
BUN/Creatinine Ratio: 11 — ABNORMAL LOW (ref 12–28)
BUN: 11 mg/dL (ref 8–27)
Bilirubin Total: 0.4 mg/dL (ref 0.0–1.2)
CO2: 21 mmol/L (ref 18–29)
Calcium: 8.5 mg/dL — ABNORMAL LOW (ref 8.7–10.3)
Chloride: 89 mmol/L — ABNORMAL LOW (ref 96–106)
Creatinine, Ser: 1.01 mg/dL — ABNORMAL HIGH (ref 0.57–1.00)
GFR calc Af Amer: 65 mL/min/{1.73_m2} (ref >59)
GFR calc non Af Amer: 56 mL/min/{1.73_m2} — ABNORMAL LOW (ref >59)
Globulin, Total: 3.1 g/dL (ref 1.5–4.5)
Glucose: 98 mg/dL (ref 65–99)
Potassium: 3.6 mmol/L (ref 3.5–5.2)
Sodium: 125 mmol/L — ABNORMAL LOW (ref 134–144)
Total Protein: 7.1 g/dL (ref 6.0–8.5)

## 2016-07-16 LAB — BASIC METABOLIC PANEL
Anion gap: 6 (ref 5–15)
BUN: 14 mg/dL (ref 6–20)
CALCIUM: 8.1 mg/dL — AB (ref 8.9–10.3)
CHLORIDE: 93 mmol/L — AB (ref 101–111)
CO2: 24 mmol/L (ref 22–32)
CREATININE: 1.01 mg/dL — AB (ref 0.44–1.00)
GFR calc non Af Amer: 55 mL/min — ABNORMAL LOW (ref >60)
GLUCOSE: 107 mg/dL — AB (ref 65–99)
Potassium: 2.9 mmol/L — ABNORMAL LOW (ref 3.5–5.1)
Sodium: 123 mmol/L — ABNORMAL LOW (ref 135–145)

## 2016-07-16 LAB — LIPID PANEL
CHOL/HDL RATIO: 3.6 ratio
Cholesterol: 119 mg/dL (ref 0–200)
HDL: 33 mg/dL — ABNORMAL LOW (ref >40)
LDL Cholesterol: 69 mg/dL (ref 0–99)
Triglycerides: 87 mg/dL (ref <150)
VLDL: 17 mg/dL (ref 0–40)

## 2016-07-16 LAB — CBC WITH DIFFERENTIAL/PLATELET
Basophils Absolute: 0 10*3/uL (ref 0.0–0.2)
Basos: 0 %
EOS (ABSOLUTE): 0 10*3/uL (ref 0.0–0.4)
Eos: 0 %
Hematocrit: 30.7 % — ABNORMAL LOW (ref 34.0–46.6)
Hemoglobin: 10.3 g/dL — ABNORMAL LOW (ref 11.1–15.9)
Immature Grans (Abs): 0 10*3/uL (ref 0.0–0.1)
Immature Granulocytes: 0 %
Lymphocytes Absolute: 0.8 10*3/uL (ref 0.7–3.1)
Lymphs: 5 %
MCH: 32.6 pg (ref 26.6–33.0)
MCHC: 33.6 g/dL (ref 31.5–35.7)
MCV: 97 fL (ref 79–97)
Monocytes Absolute: 0.9 10*3/uL (ref 0.1–0.9)
Monocytes: 7 %
Neutrophils Absolute: 12.5 10*3/uL — ABNORMAL HIGH (ref 1.4–7.0)
Neutrophils: 88 %
Platelets: 224 10*3/uL (ref 150–379)
RBC: 3.16 x10E6/uL — ABNORMAL LOW (ref 3.77–5.28)
RDW: 12.9 % (ref 12.3–15.4)
WBC: 14.2 10*3/uL — ABNORMAL HIGH (ref 3.4–10.8)

## 2016-07-16 LAB — SODIUM
SODIUM: 127 mmol/L — AB (ref 135–145)
SODIUM: 128 mmol/L — AB (ref 135–145)

## 2016-07-16 LAB — CBC
HCT: 31.7 % — ABNORMAL LOW (ref 35.0–47.0)
Hemoglobin: 10.9 g/dL — ABNORMAL LOW (ref 12.0–16.0)
MCH: 33.8 pg (ref 26.0–34.0)
MCHC: 34.4 g/dL (ref 32.0–36.0)
MCV: 98.3 fL (ref 80.0–100.0)
PLATELETS: 253 10*3/uL (ref 150–440)
RBC: 3.22 MIL/uL — ABNORMAL LOW (ref 3.80–5.20)
RDW: 13.1 % (ref 11.5–14.5)
WBC: 12.6 10*3/uL — ABNORMAL HIGH (ref 3.6–11.0)

## 2016-07-16 LAB — HEPARIN LEVEL (UNFRACTIONATED): Heparin Unfractionated: <0.1 IU/mL — ABNORMAL LOW (ref 0.30–0.70)

## 2016-07-16 LAB — PHOSPHORUS
PHOSPHORUS: 1.4 mg/dL — AB (ref 2.5–4.6)
PHOSPHORUS: 2.2 mg/dL — AB (ref 2.5–4.6)

## 2016-07-16 LAB — BRAIN NATRIURETIC PEPTIDE
B Natriuretic Peptide: 835 pg/mL — ABNORMAL HIGH (ref 0.0–100.0)
BNP: 1405 pg/mL — ABNORMAL HIGH (ref 0.0–100.0)

## 2016-07-16 LAB — ECHOCARDIOGRAM COMPLETE
Height: 61.5 in
Weight: 2353.6 oz

## 2016-07-16 LAB — TSH: TSH: 2.968 u[IU]/mL (ref 0.350–4.500)

## 2016-07-16 LAB — INFLUENZA PANEL BY PCR (TYPE A & B)
Influenza A By PCR: NEGATIVE
Influenza B By PCR: NEGATIVE

## 2016-07-16 LAB — TROPONIN I
TROPONIN I: 0.18 ng/mL — AB (ref <0.03)
TROPONIN I: 0.13 ng/mL — AB (ref <0.03)
TROPONIN I: 0.18 ng/mL — AB (ref <0.03)
TROPONIN I: 0.13 ng/mL — AB (ref <0.03)
Troponin I: 1.47 ng/mL (ref 0.00–0.04)

## 2016-07-16 LAB — APTT: aPTT: 29 seconds (ref 24–36)

## 2016-07-16 LAB — POTASSIUM: POTASSIUM: 4.1 mmol/L (ref 3.5–5.1)

## 2016-07-16 LAB — MAGNESIUM: MAGNESIUM: 1.8 mg/dL (ref 1.7–2.4)

## 2016-07-16 MED ORDER — SENNOSIDES-DOCUSATE SODIUM 8.6-50 MG PO TABS: 1.0000 | ORAL_TABLET | Freq: Every evening | ORAL | Status: DC | PRN

## 2016-07-16 MED ORDER — PROMETHAZINE HCL 25 MG PO TABS: 12.5000 mg | ORAL_TABLET | Freq: Three times a day (TID) | ORAL | Status: DC | PRN

## 2016-07-16 MED ORDER — ONDANSETRON HCL 4 MG/2ML IJ SOLN: 4.0000 mg | Freq: Four times a day (QID) | INTRAMUSCULAR | Status: DC | PRN

## 2016-07-16 MED ORDER — OXYCODONE HCL 5 MG PO TABS: 5.0000 mg | ORAL_TABLET | ORAL | Status: DC | PRN

## 2016-07-16 MED ORDER — POTASSIUM CHLORIDE CRYS ER 20 MEQ PO TBCR
20.0000 meq | EXTENDED_RELEASE_TABLET | Freq: Once | ORAL | Status: AC
  Administered 2016-07-16: 20 meq via ORAL
  Filled 2016-07-16: qty 1

## 2016-07-16 MED ORDER — HEPARIN BOLUS VIA INFUSION
3700.0000 [IU] | Freq: Once | INTRAVENOUS | Status: AC
  Administered 2016-07-16: 18:00:00 3700 [IU] via INTRAVENOUS
  Filled 2016-07-16: qty 3700

## 2016-07-16 MED ORDER — ACETAMINOPHEN 650 MG RE SUPP: 650.0000 mg | Freq: Four times a day (QID) | RECTAL | Status: DC | PRN

## 2016-07-16 MED ORDER — K PHOS MONO-SOD PHOS DI & MONO 155-852-130 MG PO TABS
500.0000 mg | ORAL_TABLET | Freq: Four times a day (QID) | ORAL | Status: DC
  Administered 2016-07-16 – 2016-07-17 (×2): 500 mg via ORAL
  Filled 2016-07-16 (×4): qty 2

## 2016-07-16 MED ORDER — VITAMIN D 1000 UNITS PO TABS
1000.0000 [IU] | ORAL_TABLET | Freq: Every day | ORAL | Status: DC
  Administered 2016-07-16 – 2016-07-17 (×2): 1000 [IU] via ORAL
  Filled 2016-07-16 (×2): qty 1

## 2016-07-16 MED ORDER — POTASSIUM PHOSPHATES 15 MMOLE/5ML IV SOLN
15.0000 mmol | Freq: Once | INTRAVENOUS | Status: AC
  Administered 2016-07-16: 15 mmol via INTRAVENOUS
  Filled 2016-07-16: qty 5

## 2016-07-16 MED ORDER — ALBUTEROL SULFATE (2.5 MG/3ML) 0.083% IN NEBU: 2.5000 mg | INHALATION_SOLUTION | Freq: Four times a day (QID) | RESPIRATORY_TRACT | Status: DC | PRN

## 2016-07-16 MED ORDER — ASPIRIN 81 MG PO CHEW
CHEWABLE_TABLET | ORAL | Status: AC
  Administered 2016-07-16: 324 mg via ORAL
  Filled 2016-07-16: qty 4

## 2016-07-16 MED ORDER — HEPARIN (PORCINE) IN NACL 100-0.45 UNIT/ML-% IJ SOLN
1050.0000 [IU]/h | INTRAMUSCULAR | Status: DC
  Administered 2016-07-16: 800 [IU]/h via INTRAVENOUS
  Filled 2016-07-16 (×2): qty 250

## 2016-07-16 MED ORDER — LORATADINE 10 MG PO TABS
10.0000 mg | ORAL_TABLET | Freq: Every day | ORAL | Status: DC
  Administered 2016-07-16 – 2016-07-17 (×2): 10 mg via ORAL
  Filled 2016-07-16 (×2): qty 1

## 2016-07-16 MED ORDER — HYDRALAZINE HCL 20 MG/ML IJ SOLN: 10.0000 mg | Freq: Four times a day (QID) | INTRAMUSCULAR | Status: DC | PRN

## 2016-07-16 MED ORDER — TAMOXIFEN CITRATE 10 MG PO TABS
20.0000 mg | ORAL_TABLET | Freq: Every day | ORAL | Status: DC
  Administered 2016-07-16 – 2016-07-17 (×2): 20 mg via ORAL
  Filled 2016-07-16 (×2): qty 2

## 2016-07-16 MED ORDER — PANTOPRAZOLE SODIUM 40 MG PO TBEC
40.0000 mg | DELAYED_RELEASE_TABLET | Freq: Every day | ORAL | Status: DC | PRN
  Filled 2016-07-16: qty 1

## 2016-07-16 MED ORDER — SODIUM CHLORIDE 0.9 % IV BOLUS (SEPSIS)
500.0000 mL | Freq: Once | INTRAVENOUS | Status: AC
  Administered 2016-07-16: 500 mL via INTRAVENOUS

## 2016-07-16 MED ORDER — ALPRAZOLAM 0.5 MG PO TABS
0.5000 mg | ORAL_TABLET | ORAL | Status: DC
  Administered 2016-07-16 – 2016-07-17 (×2): 0.5 mg via ORAL
  Filled 2016-07-16 (×2): qty 1

## 2016-07-16 MED ORDER — ONDANSETRON HCL 4 MG PO TABS: 4.0000 mg | ORAL_TABLET | Freq: Four times a day (QID) | ORAL | Status: DC | PRN

## 2016-07-16 MED ORDER — POTASSIUM CHLORIDE IN NACL 20-0.9 MEQ/L-% IV SOLN
INTRAVENOUS | Status: DC
  Administered 2016-07-16 – 2016-07-17 (×2): via INTRAVENOUS
  Filled 2016-07-16 (×4): qty 1000

## 2016-07-16 MED ORDER — ACETAMINOPHEN 325 MG PO TABS
650.0000 mg | ORAL_TABLET | Freq: Four times a day (QID) | ORAL | Status: DC | PRN
Start: 2016-07-16 — End: 2016-07-17

## 2016-07-16 MED ORDER — ASPIRIN 81 MG PO CHEW
81.0000 mg | CHEWABLE_TABLET | Freq: Every day | ORAL | Status: DC
  Administered 2016-07-16 – 2016-07-17 (×2): 81 mg via ORAL
  Filled 2016-07-16 (×2): qty 1

## 2016-07-16 MED ORDER — IOPAMIDOL (ISOVUE-370) INJECTION 76%
75.0000 mL | Freq: Once | INTRAVENOUS | Status: AC | PRN
  Administered 2016-07-16: 75 mL via INTRAVENOUS

## 2016-07-16 MED ORDER — ASPIRIN 81 MG PO CHEW
324.0000 mg | CHEWABLE_TABLET | Freq: Once | ORAL | Status: AC
  Administered 2016-07-16: 324 mg via ORAL

## 2016-07-16 MED ORDER — METOPROLOL SUCCINATE ER 50 MG PO TB24
25.0000 mg | ORAL_TABLET | ORAL | Status: DC
Start: 2016-07-16 — End: 2016-07-17
  Administered 2016-07-16 – 2016-07-17 (×2): 25 mg via ORAL
  Filled 2016-07-16 (×2): qty 1

## 2016-07-16 MED ORDER — PSYLLIUM 95 % PO PACK
1.0000 | PACK | Freq: Two times a day (BID) | ORAL | Status: DC
  Administered 2016-07-16: 1 via ORAL
  Filled 2016-07-16 (×4): qty 1

## 2016-07-16 MED ORDER — SODIUM CHLORIDE 0.9% FLUSH
3.0000 mL | Freq: Two times a day (BID) | INTRAVENOUS | Status: DC
  Administered 2016-07-16: 3 mL via INTRAVENOUS

## 2016-07-16 MED ORDER — HEPARIN BOLUS VIA INFUSION
1900.0000 [IU] | Freq: Once | INTRAVENOUS | Status: AC
  Administered 2016-07-16: 1900 [IU] via INTRAVENOUS
  Filled 2016-07-16: qty 1900

## 2016-07-16 MED ORDER — POTASSIUM CHLORIDE 2 MEQ/ML IV SOLN
30.0000 meq | Freq: Once | INTRAVENOUS | Status: AC
  Administered 2016-07-16: 12:00:00 30 meq via INTRAVENOUS
  Filled 2016-07-16: qty 15

## 2016-07-16 NOTE — Progress Notes (Signed)
*  PRELIMINARY RESULTS* Echocardiogram 2D Echocardiogram has been performed.  Bianca Shaw 07/16/2016, 2:47 PM

## 2016-07-16 NOTE — Consult Note (Signed)
Cardiology Consultation Note  Patient ID: Bianca Shaw, MRN: [id number], DOB/AGE: 08/19/1945 71 y.o. Admit date: 07/16/2016   Date of Consult: 07/16/2016 Primary Physician: Wilhemena Durie, MD Primary Cardiologist: New to [facility] - consult by Rockey Situ Requesting Physician: Dr. Benjie Karvonen, MD  Chief Complaint: Chest pain/elevated troponin Reason for Consult: Elevated troponin  HPI: 71 y.o. female with h/o NICM s/p prior treadmill stress test with prior EF 50% by TTE and 09/2008, presumed viral pericarditis and 09/2008, recently diagnosed left-sided breast cancer status post mastectomy on hormone replacement therapy, prior tobacco abuse of at least 40 years quitting approximately 2008, smoking approximately 1 pack daily, chronic hyponatremia followed by nephrology, anemia, hypertension, and anxiety who presented to the ED on 3/22 after she was noted to have an elevated troponin of 1.47 at PCP office on 3/21.  Patient was previously evaluated by cardiology in 2010 4 acute pericarditis presumed to be viral in etiology as well as nonischemic cardiomyopathy with EF of 50% right TTE and 09/2008. At that time patient underwent treadmill stress test that was normal. She has not followed up with cardiology since that time.  Patient reports over the past week to week and a half all of her granddaughters and daughter (were living with patient at this time) disease have been dealing with nausea, vomiting, and diarrhea. Patient developed nausea with one episode of emesis on Sunday, 07/12/16. She has had a couple episodes of small loose stools but nothing that she would describe as "diarrhea." Patient has only thrown up about one time though has had persistent dry heaving. Over the past several days patient has noted left sided chest pain that is worse with deep inspiration. Pain is not exertional. There is associated shortness of breath, nausea, dry heaves. No associated palpitations, diaphoresis, dizziness, presyncope,  or syncope. Patient was wondering why she continued to feel poorly when the rest of her family was back to baseline after their GI upset. Because of this she presented to PCP on 3/21 with the above recent GI illness though patient describes her GI illness not as severe as the rest of her family. As well as left-sided chest tightness. Patient was given supportive care for her GI upset. EKG at PCP office showed sinus rhythm with LVH as well as nonspecific inferior ST-T changes presumed to be in the setting of LV strain. Labs were drawn including troponin at that time which came back elevated at 1.47. Patient was contacted this morning by PCP and advised to proceed to the ER.  Upon the patient's arrival to [facility] they were found to have blood pressure 156/81, heart rate 86 bpm, afebrile, oxygen saturation 100% on room air. She continued to have left-sided chest pain with deep inspiration rated at a 4-5 out of 10 and dull. Without deep inspiration patient was symptom-free. In the ER initial troponin was found to be elevated at 0.18. BNP elevated at 835. CBC showed a leukocytosis of 14.2 trending to 12.6 today. Hemoglobin 10.9. Platelet count 253. Potassium one day ago 3.6 trending to 2.9 today. Sodium 125 trending to 123, negative influenza A and B, and normal TSH. ECG as below, CXR showed no active cardiopulmonary disease. CTA chest PE protocol showed no evidence of pulmonary embolism with a trace right pleural effusion as well as calcified aorta and coronary arteries. Currently without chest pain unless takes deep breath. Was started on IV fluids with potassium supplementation upon admission by internal medicine. Echocardiogram pending. She was given full dose aspirin in the  ED.  Past Medical History:  Diagnosis Date  . Anemia   . Anxiety disorder   . Breast cancer of upper-outer quadrant of left female breast ([place]) 11/2015   pT2 pN0(i+).;ER+; PR +, her 2 neu not overexpressed.  Mastectomy, SLN, Mammoprint: Low  risk.   . Cancer ([place]) 12/03/2015   left breast/ INVASIVE LOBULAR CARCINOMA.   . Cough    lingering, mild, finished Prednisone and anitbiotic 11/03/15  . Family history of adverse reaction to anesthesia    sister - PONV  . GERD (gastroesophageal reflux disease)   . H/O: hysterectomy   . Hypertension   . Osteopenia   . Osteoporosis   . Pericarditis    diagnonsed June, 2010, unclear etiology as of yer  . Personal history of tobacco use, presenting hazards to health 10/31/2015  . Scleroderma (HCC)    ONLY ON SKIN-MILD  . UTI (lower urinary tract infection)   . Wears dentures    full upper      Most Recent Cardiac Studies: Treadmill stress test 2010: Summary: Cardiology Exercise Treadmill Test   Exercise Tolerance Test Cardiovascular Risk History:      Positive major cardiovascular risk factors include female age over 76 years old and hypertension.    Baseline EKG:    Rhythm:     normal sinus    Rate:       76    PR:       .18    QRS:       .08    QT:       .39    QTc:       .44  Exercise Tolerance Test Results:    Ordering MD:        Dr.Daniel Bensimhon    Interpreting MD:     Ignacia Bayley NP    Indication for ETT:     chest pain-rule out ischemia    Stress Modality:     exercise-treadmill    Cardiac Imaging Performed:   none    Protocol:       Standard Bruce-maximal    Maximum BP:        191 / 91    MPHR (bpm):        157    85% MPHR (bpm):     133    MHR obtained (bpm):        136    Reached 85% MPHR       (min:sec):       7:00    Total Exercise Time       (min:sec):       7:13    Workload in METS:     7.9    Borg Scale:       15    ST Segment analysis:       At Rest:       normal ST segments-no evidence of significant ST depression       With Exercise:     borderline ST changes    Arrhythmia:             no    Angina during ETT:     absent (0)  Cardiovascular Risk Assessment/Plan:      The patient's hypertensive risk group is category B: At least one risk  factor (excluding diabetes) with no target organ damage.  Today's blood pressure is 154/94.  Her blood pressure goal is < 140/90.  Exercise Tolerance Test Assessment:    Quality of  ETT:   diagnostic    ETT Interpretation:   borderline (indeterminate) with non-specific ST changes    Comments:     0.5-4mm flat to upsloping ST depression in leads V3-V6, occurring with exercise and resolving in recovery.  No Chest Pain.  Appended Document: Cardiology Exercise Treadmill Test Preliminarily reviewed. Forwarded to MD desktop for review and signature  Joelene Millin :)  Appended Document: Cardiology Exercise Treadmill Test ok. no further testing  TTE 09/2008:  EF 50%, normally LV size and systolic function, normal RV systolic size and function , moderate mitral regurgitation, mild TR, no pericardial effusion    Surgical History:  Past Surgical History:  Procedure Laterality Date  . BREAST BIOPSY Right 2012   core - neg  . BREAST BIOPSY Left 12/03/2015   INVASIVE LOBULAR CARCINOMA.   Marland Kitchen CATARACT EXTRACTION W/ INTRAOCULAR LENS IMPLANT Right   . COLONOSCOPY WITH PROPOFOL N/A 11/08/2015   Procedure: COLONOSCOPY WITH PROPOFOL;  Surgeon: Lucilla Lame, MD;  Location: [facility];  Service: Endoscopy;  Laterality: N/A;  . EVACUATION BREAST HEMATOMA Left 01/14/2016   Procedure: EVACUATION HEMATOMA BREAST;  Surgeon: Robert Bellow, MD;  Location: [facility];  Service: General;  Laterality: Left;  Marland Kitchen MASTECTOMY W/ SENTINEL NODE BIOPSY Left 12/26/2015   Procedure: MASTECTOMY WITH SENTINEL LYMPH NODE BIOPSY;  Surgeon: Robert Bellow, MD;  Location: [facility];  Service: General;  Laterality: Left;  . TUBAL LIGATION    . VESICOVAGINAL FISTULA CLOSURE W/ TAH       Home Meds: Prior to Admission medications   Medication Sig Start Date End Date Taking? Authorizing Provider  ALPRAZolam Duanne Moron) 0.5 MG tablet Take 1 tablet (0.5 mg total) by mouth every morning. 1/2 tablet twice daily as needed 02/08/16  Yes  Richard Maceo Pro., MD  amLODipine-olmesartan (AZOR) 10-40 MG tablet Take 1 tablet by mouth as needed.  08/08/12  Yes Historical Provider, MD  busPIRone (BUSPAR) 5 MG tablet 1 Tablet, Oral QHS 12/26/15  Yes Robert Bellow, MD  metoprolol succinate (TOPROL-XL) 25 MG 24 hr tablet Take 1 tablet (25 mg total) by mouth daily. Patient taking differently: Take 25 mg by mouth every morning.  01/03/16  Yes Richard Maceo Pro., MD  tamoxifen (NOLVADEX) 20 MG tablet Take 1 tablet (20 mg total) by mouth daily. 06/09/16  Yes Lequita Asal, MD  albuterol (PROVENTIL) (2.5 MG/3ML) 0.083% nebulizer solution Take 3 mLs (2.5 mg total) by nebulization every 6 (six) hours as needed for wheezing or shortness of breath. 06/06/15   Mar Daring, PA-C  aspirin 81 MG tablet Take 81 mg by mouth daily.  11/26/10   Historical Provider, MD  fluticasone (FLONASE) 50 MCG/ACT nasal spray Place 2 sprays into the nose as needed.  11/26/10   Historical Provider, MD  ibuprofen (ADVIL,MOTRIN) 200 MG tablet Take 200 mg by mouth every 6 (six) hours as needed.      Historical Provider, MD  omeprazole (PRILOSEC) 20 MG capsule Take 20 mg by mouth as needed.     Historical Provider, MD    Inpatient Medications:  . ALPRAZolam  0.5 mg Oral BH-q7a  . aspirin  81 mg Oral Daily  . cholecalciferol  1,000 Units Oral Daily  . loratadine  10 mg Oral Daily  . metoprolol succinate  25 mg Oral BH-q7a  . potassium chloride (KCL MULTIRUN) 30 mEq in 265 mL IVPB  30 mEq Intravenous Once  . psyllium  1 packet Oral BID  . sodium chloride flush  3 mL Intravenous Q12H  . tamoxifen  20 mg Oral Daily   . 0.9 % NaCl with KCl 20 mEq / L      Allergies:  Allergies  Allergen Reactions  . Diuretic  [Buchu-Cornsilk-Ch Grass-Hydran]     hyponatremia  . Furosemide     hives  . Other     SSRI---hyponatremia  . Pimenta Nausea And Vomiting    Social History   Social History  . Marital status: Married    Spouse name: N/A  . Number of  children: 2  . Years of education: N/A   Occupational History  . Not on file.   Social History Main Topics  . Smoking status: Former Smoker    Packs/day: 0.75    Years: 40.00    Types: Cigarettes    Quit date: 11/07/2003  . Smokeless tobacco: Never Used  . Alcohol use 4.2 oz/week    7 Glasses of wine per week     Comment: 1-3 a day WINE  . Drug use: No  . Sexual activity: Not on file   Other Topics Concern  . Not on file   Social History Narrative   She drinks 1 to 2 caffeinated beverages a day     Family History  Problem Relation Age of Onset  . Heart failure Mother   . Epilepsy Mother   . COPD Father   . Heart disease Father   . Anxiety disorder Sister   . Arthritis Brother   . Heart disease Brother   . Vaginal cancer Paternal Grandmother   . Heart attack Paternal Grandfather   . COPD Brother   . Kidney failure Brother   . COPD Brother   . Arthritis Sister   . Uterine cancer    . Diabetes    . Colon cancer Neg Hx   . Stomach cancer Neg Hx   . Breast cancer Neg Hx      Review of Systems: Review of Systems  Constitutional: Positive for malaise/fatigue. Negative for chills, diaphoresis, fever and weight loss.  HENT: Negative for congestion.   Eyes: Negative for discharge and redness.  Respiratory: Positive for shortness of breath. Negative for cough, hemoptysis, sputum production and wheezing.   Cardiovascular: Positive for chest pain. Negative for palpitations, orthopnea, claudication, leg swelling and PND.  Gastrointestinal: Positive for nausea and vomiting. Negative for abdominal pain, blood in stool, heartburn and melena.  Genitourinary: Negative for hematuria.  Musculoskeletal: Negative for falls and myalgias.  Skin: Negative for rash.  Neurological: Positive for weakness. Negative for dizziness, tingling, tremors, sensory change, speech change, focal weakness and loss of consciousness.  Endo/Heme/Allergies: Does not bruise/bleed easily.    Psychiatric/Behavioral: Negative for substance abuse. The patient is not nervous/anxious.   All other systems reviewed and are negative.   Labs:  Recent Labs  07/15/16 1106 07/16/16 0602  TROPONINI 1.47* 0.18*   Lab Results  Component Value Date   WBC 12.6 (H) 07/16/2016   HGB 10.9 (L) 07/16/2016   HCT 31.7 (L) 07/16/2016   MCV 98.3 07/16/2016   PLT 253 07/16/2016     Recent Labs Lab 07/15/16 1106 07/16/16 0602  NA 125* 123*  K 3.6 2.9*  CL 89* 93*  CO2 21 24  BUN 11 14  CREATININE 1.01* 1.01*  CALCIUM 8.5* 8.1*  PROT 7.1  --   BILITOT 0.4  --   ALKPHOS 41  --   ALT 11  --   AST 34  --   GLUCOSE  98 107*   Lab Results  Component Value Date   CHOL 119 07/16/2016   HDL 33 (L) 07/16/2016   LDLCALC 69 07/16/2016   TRIG 87 07/16/2016   No results found for: DDIMER  Radiology/Studies:  Dg Chest 2 View  Result Date: 07/16/2016 CLINICAL DATA:  71 year old female with shortness of breath EXAM: CHEST  2 VIEW COMPARISON:  Chest radiograph dated 10/28/2015 and CT dated 11/01/2015 FINDINGS: The lungs are clear. A 5 mm nodular density in the lower lung field likely correspond to the calcified nodule seen on the prior CT. There is no pleural effusion or pneumothorax. The cardiac silhouette is within normal limits with no acute osseous pathology. IMPRESSION: No active cardiopulmonary disease. Electronically Signed   By: Anner Crete M.D.   On: 07/16/2016 06:29   Ct Angio Chest Pe W Or Wo Contrast  Result Date: 07/16/2016 CLINICAL DATA:  71 year old female with flu like symptoms for 1 week. Shortness of breath and pleuritic chest pain. EXAM: CT ANGIOGRAPHY CHEST WITH CONTRAST TECHNIQUE: Multidetector CT imaging of the chest was performed using the standard protocol during bolus administration of intravenous contrast. Multiplanar CT image reconstructions and MIPs were obtained to evaluate the vascular anatomy. CONTRAST:  75 mL Isovue 370 COMPARISON:  Noncontrast Chest CT  11/01/2015, and earlier FINDINGS: Cardiovascular: Good contrast bolus timing in the pulmonary arterial tree. Very mild respiratory motion artifact in the posterior lower lobes. No focal filling defect identified in the pulmonary arteries to suggest acute pulmonary embolism. Chronic calcified aortic and coronary artery atherosclerosis. Aberrant origin of the right subclavian artery, normal variant. Mild cardiomegaly. No pericardial effusion. Mediastinum/Nodes: Negative.  No lymphadenopathy. Lungs/Pleura: Stable biapical lung scarring. A 7 mm posterior right upper lobe lung subpleural nodule is mildly spiculated but appears stable since 2015. Calcified granuloma along the right minor fissure. Major airways are patent. Trace layering right pleural effusion is new. No left pleural effusion. No other new pulmonary finding. Upper Abdomen: Stable occasional small calcified granulomas in the liver. Negative visible gallbladder, spleen, pancreas, adrenal glands, kidneys and stomach. Diverticulosis at the splenic flexure. Musculoskeletal: No acute osseous abnormality identified. Status post left mastectomy since the prior chest CT. No left axillary lymphadenopathy. Review of the MIP images confirms the above findings. IMPRESSION: 1.  No evidence of acute pulmonary embolus. 2. Trace layering right pleural effusion is new since the prior chest CT. But there is no associated acute Pulmonary opacity. Posterior right upper lobe lung nodule remains stable. 3. Calcified aorta and coronary artery atherosclerosis. Electronically Signed   By: Genevie Ann M.D.   On: 07/16/2016 08:06    EKG: Interpreted by me showed: NSR, 80 bpm, nonspecific inferior ST elevation with minimal lateral ST depression (not diagnostic for STEMI), LVH Telemetry: Interpreted by me showed: Sinus rhythm, 70s bpm  Weights: Filed Weights   07/16/16 0551 07/16/16 1111  Weight: 147 lb (66.7 kg) 147 lb 1.6 oz (66.7 kg)     Physical Exam: Blood pressure (!)  174/75, pulse 72, temperature 97.2 F (36.2 C), resp. rate 18, height 5' 1.5" (1.562 m), weight 147 lb 1.6 oz (66.7 kg), SpO2 100 %. Body mass index is 27.34 kg/m. General: Well developed, well nourished, in no acute distress. Head: Normocephalic, atraumatic, sclera non-icteric, no xanthomas, nares are without discharge.  Neck: Negative for carotid bruits. JVD not elevated. Lungs: Clear bilaterally to auscultation without wheezes, rales, or rhonchi. Breathing is unlabored. Heart: RRR with S1 S2. No murmurs, rubs, or gallops appreciated. Abdomen: Soft, non-tender,  non-distended with normoactive bowel sounds. No hepatomegaly. No rebound/guarding. No obvious abdominal masses. Msk:  Strength and tone appear normal for age. Extremities: No clubbing or cyanosis. No edema. Distal pedal pulses are 2+ and equal bilaterally. Neuro: Alert and oriented X 3. No facial asymmetry. No focal deficit. Moves all extremities spontaneously. Psych:  Responds to questions appropriately with a normal affect.    Assessment and Plan:  Principal Problem:   Non-ST elevation (NSTEMI) myocardial infarction [facility]) Active Problems:   Hyponatremia   Elevated troponin   Acute CHF (congestive heart failure) (HCC)   Hypokalemia   Gastroenteritis    1. NSTEMI: -Initial troponin in the outpatient setting on 321 of 1.47, patient called by PCP on 3/22 and advised to proceed to the ER. Initial troponin on 322 in the ER noted to be down trending at 0.18. Symptoms are somewhat atypical given her pleuritic nature though she does have some concerning symptoms including nausea/vomiting as well as risk factors including long history of tobacco abuse quitting in 2008 as above, hyperlipidemia, hypertension, overweight -Prior ischemic evaluation in June/2010 via GXT that was nonischemic. No further ischemic evaluation since then -Currently chest pain-free unless takes deep inspiration -Given initial troponin elevation of 7.49  (uncertain at this time if this was patient's peak or possible down trend from prior given the duration of her symptoms dating back to the prior weekend) Will place patient on heparin drip per pharmacy -Check echocardiogram to evaluate EF and wall motion -Will likely need cardiac catheterization on 3/23 given patient's symptoms and elevated troponin as above -Continue ASA and Toprol -Add Lipitor 40 mg every dinner  2. SOB/elevated BNP/acute CHF: -BNP noted to be elevated at 835, with CTA chest showing right sided pleural effusion -Will need gentle diuresis though will need to monitor electrolytes carefully given chronic hyponatremia as well as hypokalemia in the setting of her GI upset  3. Possible viral gastroenteritis: -Patient with multiple family members with prolonged nausea, vomiting, diarrhea. Patient reports her symptoms were much less severe, vomiting only once though with persistent nausea for which she sought care at her PCP on 3/21 as above -Consider GI panel as well as C. difficile toxin analysis, will defer to internal medicine -Supportive care per IM  4. Hypokalemia: -Patient presented with potassium 2.9 that had down trended from 3.6 day prior likely in the setting of the above. Cannot rule out spasm playing a role in her symptoms -Currently being repleted via IV per IM -Check magnesium  5. Chronic hyponatremia: -Followed by nephrology as an outpatient  -Consider Addison's disease given patient's hyponatremia, fatigue that has been long-standing, anemia, hyperpigmentation -Currently not hyperkalemic though patient has had GI upset  6. Left-sided breast cancer status post mastectomy: -Hormone therapy per oncology  7. Anemia: -Appears stable, per IM   Signed, Christell Faith, PA-C [place] Pager: [phone] 07/16/2016, 12:28 PM

## 2016-07-16 NOTE — Progress Notes (Signed)
   Patient discussed [place] with husband and both patient and husband would like to proceed with cardiac catheterization on 3/23. Orders are placed and she is on the schedule.

## 2016-07-16 NOTE — ED Notes (Signed)
Pt updated on bed request change. Pt resting quietly on stretcher, pt and husband in agreeance to stay.

## 2016-07-16 NOTE — Progress Notes (Signed)
MEDICATION RELATED CONSULT NOTE - INITIAL   Pharmacy Consult for electrolyte replacement   Pharmacy consulted for electrolyte management for 71 yo female admitted with elevated troponins. Patient is currently receiving NS/KCl 64mEq at 58mL/min. Patient received potassium 52mEq PO x 1.   Plan:  Will order potassium 27mEq IV x 1. Will check magnesium and phosphorus and replace as warranted.   ADD: Magnesium wnl. Phos= 1.4. Will give KPhos 15 mmol IV x 1. Will recheck Phos and K @ 21:30   Allergies  Allergen Reactions  . Diuretic  [Buchu-Cornsilk-Ch Grass-Hydran]     hyponatremia  . Furosemide     hives  . Other     SSRI---hyponatremia  . Pimenta Nausea And Vomiting    Patient Measurements: Height: 5' 1.5" (156.2 cm) Weight: 147 lb 1.6 oz (66.7 kg) IBW/kg (Calculated) : 48.95   Vital Signs: Temp: 97.2 F (36.2 C) (03/22 1111) Temp Source: Oral (03/22 0551) BP: 174/75 (03/22 1111) Pulse Rate: 72 (03/22 1111) Intake/Output from previous day: No intake/output data recorded. Intake/Output from this shift: Total I/O In: 500 [IV Piggyback:500] Out: -   Labs:  Recent Labs  07/15/16 1106 07/16/16 0602 07/16/16 1212  WBC 14.2* 12.6*  --   HGB  --  10.9*  --   HCT 30.7* 31.7*  --   PLT 224 253  --   CREATININE 1.01* 1.01*  --   MG  --   --  1.8  PHOS  --   --  1.4*  ALBUMIN 4.0  --   --   PROT 7.1  --   --   AST 34  --   --   ALT 11  --   --   ALKPHOS 41  --   --   BILITOT 0.4  --   --    Estimated Creatinine Clearance: 45.2 mL/min (A) (by C-G formula based on SCr of 1.01 mg/dL (H)).   Pharmacy will continue to monitor and adjust per consult.   Marceil Welp D 07/16/2016,1:28 PM

## 2016-07-16 NOTE — Progress Notes (Signed)
MEDICATION RELATED CONSULT NOTE - INITIAL   Pharmacy Consult for electrolyte replacement   Pharmacy consulted for electrolyte management for 71 yo female admitted with elevated troponins. Patient is currently receiving NS/KCl 49mEq at 85mL/min. Patient received potassium 63mEq PO x 1.   Plan:  Will order potassium 40mEq IV x 1. Will check magnesium and phosphorus and replace as warranted.   Allergies  Allergen Reactions  . Diuretic  [Buchu-Cornsilk-Ch Grass-Hydran]     hyponatremia  . Furosemide     hives  . Other     SSRI---hyponatremia  . Pimenta Nausea And Vomiting    Patient Measurements: Height: 5\' 2"  (157.5 cm) Weight: 147 lb (66.7 kg) IBW/kg (Calculated) : 50.1   Vital Signs: Temp: 98 F (36.7 C) (03/22 0551) Temp Source: Oral (03/22 0551) BP: 168/92 (03/22 1000) Pulse Rate: 74 (03/22 1000) Intake/Output from previous day: No intake/output data recorded. Intake/Output from this shift: Total I/O In: 500 [IV Piggyback:500] Out: -   Labs:  Recent Labs  07/15/16 1106 07/16/16 0602  WBC 14.2* 12.6*  HGB  --  10.9*  HCT 30.7* 31.7*  PLT 224 253  CREATININE 1.01* 1.01*  ALBUMIN 4.0  --   PROT 7.1  --   AST 34  --   ALT 11  --   ALKPHOS 41  --   BILITOT 0.4  --    Estimated Creatinine Clearance: 45.7 mL/min (A) (by C-G formula based on SCr of 1.01 mg/dL (H)).   Pharmacy will continue to monitor and adjust per consult.   Simpson,Michael L 07/16/2016,11:11 AM

## 2016-07-16 NOTE — Progress Notes (Signed)
ANTICOAGULATION CONSULT NOTE - Initial Consult  Pharmacy Consult for Heparin  Indication: ACS  Allergies  Allergen Reactions  . Diuretic  [Buchu-Cornsilk-Ch Grass-Hydran]     hyponatremia  . Furosemide     hives  . Other     SSRI---hyponatremia  . Pimenta Nausea And Vomiting    Patient Measurements: Height: 5' 1.5" (156.2 cm) Weight: 147 lb 1.6 oz (66.7 kg) IBW/kg (Calculated) : 48.95 Heparin Dosing Weight: 64kg  Vital Signs: Temp: 97.9 F (36.6 C) (03/22 2140) Temp Source: Oral (03/22 2140) BP: 167/74 (03/22 2140) Pulse Rate: 73 (03/22 2140)  Labs:  Recent Labs  07/15/16 1106 07/16/16 0602 07/16/16 1212 07/16/16 1705 07/16/16 2208  HGB  --  10.9*  --   --   --   HCT 30.7* 31.7*  --   --   --   PLT 224 253  --   --   --   APTT  --   --   --  29  --   HEPARINUNFRC  --   --   --   --  <0.10*  CREATININE 1.01* 1.01*  --   --   --   TROPONINI 1.47* 0.18* 0.13* 0.18* 0.13*    Estimated Creatinine Clearance: 45.2 mL/min (A) (by C-G formula based on SCr of 1.01 mg/dL (H)).   Medical History: Past Medical History:  Diagnosis Date  . Anemia   . Anxiety disorder   . Breast cancer of upper-outer quadrant of left female breast ([place]) 11/2015   pT2 pN0(i+).;ER+; PR +, her 2 neu not overexpressed.  Mastectomy, SLN, Mammoprint: Low risk.   . Cancer ([place]) 12/03/2015   left breast/ INVASIVE LOBULAR CARCINOMA.   . Cough    lingering, mild, finished Prednisone and anitbiotic 11/03/15  . Family history of adverse reaction to anesthesia    sister - PONV  . GERD (gastroesophageal reflux disease)   . H/O: hysterectomy   . Hypertension   . Osteopenia   . Osteoporosis   . Pericarditis    diagnonsed June, 2010, unclear etiology as of yer  . Personal history of tobacco use, presenting hazards to health 10/31/2015  . Scleroderma ([facility])    ONLY ON SKIN-MILD  . UTI (lower urinary tract infection)   . Wears dentures    full upper    Medications:  Prescriptions Prior to  Admission  Medication Sig Dispense Refill Last Dose  . ALPRAZolam (XANAX) 0.5 MG tablet Take 1 tablet (0.5 mg total) by mouth every morning. 1/2 tablet twice daily as needed 30 tablet 3 07/14/2016 at 0800  . amLODipine-olmesartan (AZOR) 10-40 MG tablet Take 1 tablet by mouth as needed.    07/14/2016 at 0800  . busPIRone (BUSPAR) 5 MG tablet 1 Tablet, Oral QHS 60 tablet 2 07/14/2016 at 2000  . metoprolol succinate (TOPROL-XL) 25 MG 24 hr tablet Take 1 tablet (25 mg total) by mouth daily. (Patient taking differently: Take 25 mg by mouth every morning. ) 30 tablet 12 07/14/2016 at 0800  . tamoxifen (NOLVADEX) 20 MG tablet Take 1 tablet (20 mg total) by mouth daily. 90 tablet 3 07/14/2016 at 0800  . albuterol (PROVENTIL) (2.5 MG/3ML) 0.083% nebulizer solution Take 3 mLs (2.5 mg total) by nebulization every 6 (six) hours as needed for wheezing or shortness of breath. 75 mL 12 prn at prn  . aspirin 81 MG tablet Take 81 mg by mouth daily.    07/14/2016 at 0800  . fluticasone (FLONASE) 50 MCG/ACT nasal spray Place 2 sprays  into the nose as needed.    prn at prn  . ibuprofen (ADVIL,MOTRIN) 200 MG tablet Take 200 mg by mouth every 6 (six) hours as needed.     prn at prn  . omeprazole (PRILOSEC) 20 MG capsule Take 20 mg by mouth as needed.    prn at prn   Scheduled:  . ALPRAZolam  0.5 mg Oral BH-q7a  . aspirin  81 mg Oral Daily  . cholecalciferol  1,000 Units Oral Daily  . heparin  1,900 Units Intravenous Once  . loratadine  10 mg Oral Daily  . metoprolol succinate  25 mg Oral BH-q7a  . psyllium  1 packet Oral BID  . sodium chloride flush  3 mL Intravenous Q12H  . tamoxifen  20 mg Oral Daily    Assessment: Pharmacy consulted to dose and monitor heparin in this 71 year old woman for NSTEMI Goal of Therapy:  Heparin level 0.3-0.7 units/ml Monitor platelets by anticoagulation protocol: Yes   Plan:  Give Heparin bolus 3700 units x 1. Will start heparin gtt @ 800 units/hr. Will check Heparin level @ 21:30.    3/22 22:00 heparin level <0.1. 1900 unit bolus and increase rate to 1050 units/hr. Recheck in 8 hours.   Evalene Vath S 07/16/2016,11:17 PM

## 2016-07-16 NOTE — ED Triage Notes (Signed)
Pt ambulatory to triage, no distress noted. Pt seen at PCP on Wednesday for flu like symptoms, labs drawn, pt received call this AM for elevated troponin of 14.7, advised to come to ED. Pt has SOB, denies chest pain.

## 2016-07-16 NOTE — Progress Notes (Signed)
MEDICATION RELATED CONSULT NOTE - INITIAL   Pharmacy Consult for electrolyte replacement   Pharmacy consulted for electrolyte management for 71 yo female admitted with elevated troponins. Patient is currently receiving NS/KCl 38mEq at 40mL/min. Patient received potassium 28mEq PO x 1.   Plan:  Will order potassium 62mEq IV x 1. Will check magnesium and phosphorus and replace as warranted.   3/22 PM PO4 2.2. Neutra-phos 2 tablets PO x 4 doses ordered. Recheck PO4 3/24 AM.  ADD: Magnesium wnl. Phos= 1.4. Will give KPhos 15 mmol IV x 1. Will recheck Phos and K @ 21:30   Allergies  Allergen Reactions  . Diuretic  [Buchu-Cornsilk-Ch Grass-Hydran]     hyponatremia  . Furosemide     hives  . Other     SSRI---hyponatremia  . Pimenta Nausea And Vomiting    Patient Measurements: Height: 5' 1.5" (156.2 cm) Weight: 147 lb 1.6 oz (66.7 kg) IBW/kg (Calculated) : 48.95   Vital Signs: Temp: 97.9 F (36.6 C) (03/22 2140) Temp Source: Oral (03/22 2140) BP: 167/74 (03/22 2140) Pulse Rate: 73 (03/22 2140) Intake/Output from previous day: No intake/output data recorded. Intake/Output from this shift: No intake/output data recorded.  Labs:  Recent Labs  07/15/16 1106 07/16/16 0602 07/16/16 1212 07/16/16 1705 07/16/16 2208  WBC 14.2* 12.6*  --   --   --   HGB  --  10.9*  --   --   --   HCT 30.7* 31.7*  --   --   --   PLT 224 253  --   --   --   APTT  --   --   --  29  --   CREATININE 1.01* 1.01*  --   --   --   MG  --   --  1.8  --   --   PHOS  --   --  1.4*  --  2.2*  ALBUMIN 4.0  --   --   --   --   PROT 7.1  --   --   --   --   AST 34  --   --   --   --   ALT 11  --   --   --   --   ALKPHOS 41  --   --   --   --   BILITOT 0.4  --   --   --   --    Estimated Creatinine Clearance: 45.2 mL/min (A) (by C-G formula based on SCr of 1.01 mg/dL (H)).   Pharmacy will continue to monitor and adjust per consult.   Orman Matsumura S 07/16/2016,11:24 PM

## 2016-07-16 NOTE — Progress Notes (Signed)
[facility]  ROUNDING NOTE   Subjective:   Bianca Shaw admitted to [facility] on 07/16/2016 for Pleuritic chest pain [R07.81] Dyspnea, unspecified type [R06.00] Elevated troponin [R74.8]  Daughter at bedside.  Na on admission of 125. Started on IV NS with KCl and sodium improved to 127.   Objective:  Vital signs in last 24 hours:  Temp:  [97.2 F (36.2 C)-98 F (36.7 C)] 97.2 F (36.2 C) (03/22 1111) Pulse Rate:  [69-86] 72 (03/22 1111) Resp:  [16-29] 18 (03/22 1111) BP: (156-179)/(75-92) 174/75 (03/22 1111) SpO2:  [99 %-100 %] 100 % (03/22 1111) Weight:  [66.7 kg (147 lb)-66.7 kg (147 lb 1.6 oz)] 66.7 kg (147 lb 1.6 oz) (03/22 1111)  Weight change:  Filed Weights   07/16/16 0551 07/16/16 1111  Weight: 66.7 kg (147 lb) 66.7 kg (147 lb 1.6 oz)    Intake/Output: No intake/output data recorded.   Intake/Output this shift:  Total I/O In: 500 [IV Piggyback:500] Out: -   Physical Exam: General: NAD, laying in bed  Head: Normocephalic, atraumatic. Dry oral mucosal membranes  Eyes: Anicteric, PERRL  Neck: Supple, trachea midline  Lungs:  Clear to auscultation  Heart: Regular rate and rhythm  Abdomen:  Soft, nontender,   Extremities:  no peripheral edema.  Neurologic: Nonfocal, moving all four extremities  Skin: No lesions       Basic Metabolic Panel:  Recent Labs Lab 07/15/16 1106 07/16/16 0602 07/16/16 1212  NA 125* 123* 127*  K 3.6 2.9*  --   CL 89* 93*  --   CO2 21 24  --   GLUCOSE 98 107*  --   BUN 11 14  --   CREATININE 1.01* 1.01*  --   CALCIUM 8.5* 8.1*  --   MG  --   --  1.8  PHOS  --   --  1.4*    Liver Function Tests:  Recent Labs Lab 07/15/16 1106  AST 34  ALT 11  ALKPHOS 41  BILITOT 0.4  PROT 7.1  ALBUMIN 4.0   No results for input(s): LIPASE, AMYLASE in the last 168 hours. No results for input(s): AMMONIA in the last 168 hours.  CBC:  Recent Labs Lab 07/15/16 1106 07/16/16 0602  WBC 14.2* 12.6*   NEUTROABS 12.5*  --   HGB  --  10.9*  HCT 30.7* 31.7*  MCV 97 98.3  PLT 224 253    Cardiac Enzymes:  Recent Labs Lab 07/15/16 1106 07/16/16 0602 07/16/16 1212  TROPONINI 1.47* 0.18* 0.13*    BNP: Invalid input(s): POCBNP  CBG: No results for input(s): GLUCAP in the last 168 hours.  Microbiology: No results found for this or any previous visit.  Coagulation Studies: No results for input(s): LABPROT, INR in the last 72 hours.  Urinalysis: No results for input(s): COLORURINE, LABSPEC, PHURINE, GLUCOSEU, HGBUR, BILIRUBINUR, KETONESUR, PROTEINUR, UROBILINOGEN, NITRITE, LEUKOCYTESUR in the last 72 hours.  Invalid input(s): APPERANCEUR    Imaging: Dg Chest 2 View  Result Date: 07/16/2016 CLINICAL DATA:  71 year old female with shortness of breath EXAM: CHEST  2 VIEW COMPARISON:  Chest radiograph dated 10/28/2015 and CT dated 11/01/2015 FINDINGS: The lungs are clear. A 5 mm nodular density in the lower lung field likely correspond to the calcified nodule seen on the prior CT. There is no pleural effusion or pneumothorax. The cardiac silhouette is within normal limits with no acute osseous pathology. IMPRESSION: No active cardiopulmonary disease. Electronically Signed   By: Laren Everts.D.  On: 07/16/2016 06:29   Ct Angio Chest Pe W Or Wo Contrast  Result Date: 07/16/2016 CLINICAL DATA:  71 year old female with flu like symptoms for 1 week. Shortness of breath and pleuritic chest pain. EXAM: CT ANGIOGRAPHY CHEST WITH CONTRAST TECHNIQUE: Multidetector CT imaging of the chest was performed using the standard protocol during bolus administration of intravenous contrast. Multiplanar CT image reconstructions and MIPs were obtained to evaluate the vascular anatomy. CONTRAST:  75 mL Isovue 370 COMPARISON:  Noncontrast Chest CT 11/01/2015, and earlier FINDINGS: Cardiovascular: Good contrast bolus timing in the pulmonary arterial tree. Very mild respiratory motion artifact in the  posterior lower lobes. No focal filling defect identified in the pulmonary arteries to suggest acute pulmonary embolism. Chronic calcified aortic and coronary artery atherosclerosis. Aberrant origin of the right subclavian artery, normal variant. Mild cardiomegaly. No pericardial effusion. Mediastinum/Nodes: Negative.  No lymphadenopathy. Lungs/Pleura: Stable biapical lung scarring. A 7 mm posterior right upper lobe lung subpleural nodule is mildly spiculated but appears stable since 2015. Calcified granuloma along the right minor fissure. Major airways are patent. Trace layering right pleural effusion is new. No left pleural effusion. No other new pulmonary finding. Upper Abdomen: Stable occasional small calcified granulomas in the liver. Negative visible gallbladder, spleen, pancreas, adrenal glands, kidneys and stomach. Diverticulosis at the splenic flexure. Musculoskeletal: No acute osseous abnormality identified. Status post left mastectomy since the prior chest CT. No left axillary lymphadenopathy. Review of the MIP images confirms the above findings. IMPRESSION: 1.  No evidence of acute pulmonary embolus. 2. Trace layering right pleural effusion is new since the prior chest CT. But there is no associated acute Pulmonary opacity. Posterior right upper lobe lung nodule remains stable. 3. Calcified aorta and coronary artery atherosclerosis. Electronically Signed   By: Genevie Ann M.D.   On: 07/16/2016 08:06     Medications:   . 0.9 % NaCl with KCl 20 mEq / L    . heparin     . ALPRAZolam  0.5 mg Oral BH-q7a  . aspirin  81 mg Oral Daily  . cholecalciferol  1,000 Units Oral Daily  . heparin  3,700 Units Intravenous Once  . loratadine  10 mg Oral Daily  . metoprolol succinate  25 mg Oral BH-q7a  . potassium chloride (KCL MULTIRUN) 30 mEq in 265 mL IVPB  30 mEq Intravenous Once  . potassium phosphate IVPB (mmol)  15 mmol Intravenous Once  . psyllium  1 packet Oral BID  . sodium chloride flush  3 mL  Intravenous Q12H  . tamoxifen  20 mg Oral Daily   acetaminophen **OR** acetaminophen, albuterol, hydrALAZINE, ondansetron **OR** ondansetron (ZOFRAN) IV, oxyCODONE, pantoprazole, promethazine, senna-docusate  Assessment/ Plan:  [name] is a 71 y.o. white female with history of left breast cancer status post left mastectomy 12/26/15, anxiety, hypertension, GERD, hyperlipidemia, allergic rhinitis, history of acute idiopathic pericarditis   1. 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 Currently with hypovolumia as well.  - Continue IV fluids - Monitor volume status.   2. Hypertension: elevated - holding home metoprolol, amlodipine and olmesartan  3. Chronic Kidney Disease stage III with Proteinuria: creatine stable.    LOS: 0 Daesia Zylka 3/22/20182:20 PM

## 2016-07-16 NOTE — H&P (Addendum)
[facility] [place] NAME: Bianca Shaw    MR#:  [id number]  DATE OF BIRTH:  09/13/1945  DATE OF ADMISSION:  07/16/2016  PRIMARY CARE PHYSICIAN: Wilhemena Durie, MD   REQUESTING/REFERRING PHYSICIAN: dr Owens Shark  CHIEF COMPLAINT:   Elevated troponin HISTORY OF PRESENT ILLNESS:  Bianca Shaw  is a 71 y.o. female with a known history of Breast cancer status post left mastectomy, pericarditis and anemia of chronic disease who presents with above complaint. Patient was seen by her PCP yesterday due to decreased appetite, nausea and vomiting for the past 3 days. She reports that family members have had similar symptoms. She was also complaining of chest tightness. PCP ordered routine laboratories including a troponin. She received a call this morning that the troponin level was 1.47 Today in the emergency room troponin level is 0.18. Patient is describing pleuritic chest pain without radiation associated with some shortness of breath over the past 3-5 days. This started at the same time her nausea, vomiting and overall weakness started. Her EKG yesterday showed questionable ST elevations in the inferior leads however it appeared that there were no ST elevations. Today's EKG shows PR depressions in the inferior leads. She is currently not having any chest pain. She stills has some nausea but would like to try clear liquid diet.   PAST MEDICAL HISTORY:   Past Medical History:  Diagnosis Date  . Anemia   . Anxiety disorder   . Breast cancer of upper-outer quadrant of left female breast ([place]) 11/2015   pT2 pN0(i+).;ER+; PR +, her 2 neu not overexpressed.  Mastectomy, SLN, Mammoprint: Low risk.   . Cancer ([place]) 12/03/2015   left breast/ INVASIVE LOBULAR CARCINOMA.   . Cough    lingering, mild, finished Prednisone and anitbiotic 11/03/15  . Family history of adverse reaction to anesthesia    sister - PONV  . GERD (gastroesophageal reflux disease)    . H/O: hysterectomy   . Hypertension   . Osteopenia   . Osteoporosis   . Pericarditis    diagnonsed June, 2010, unclear etiology as of yer  . Personal history of tobacco use, presenting hazards to health 10/31/2015  . Scleroderma (HCC)    ONLY ON SKIN-MILD  . UTI (lower urinary tract infection)   . Wears dentures    full upper    PAST SURGICAL HISTORY:   Past Surgical History:  Procedure Laterality Date  . BREAST BIOPSY Right 2012   core - neg  . BREAST BIOPSY Left 12/03/2015   INVASIVE LOBULAR CARCINOMA.   Marland Kitchen CATARACT EXTRACTION W/ INTRAOCULAR LENS IMPLANT Right   . COLONOSCOPY WITH PROPOFOL N/A 11/08/2015   Procedure: COLONOSCOPY WITH PROPOFOL;  Surgeon: Lucilla Lame, MD;  Location: [facility];  Service: Endoscopy;  Laterality: N/A;  . EVACUATION BREAST HEMATOMA Left 01/14/2016   Procedure: EVACUATION HEMATOMA BREAST;  Surgeon: Robert Bellow, MD;  Location: [facility];  Service: General;  Laterality: Left;  Marland Kitchen MASTECTOMY W/ SENTINEL NODE BIOPSY Left 12/26/2015   Procedure: MASTECTOMY WITH SENTINEL LYMPH NODE BIOPSY;  Surgeon: Robert Bellow, MD;  Location: [facility];  Service: General;  Laterality: Left;  . TUBAL LIGATION    . VESICOVAGINAL FISTULA CLOSURE W/ TAH      SOCIAL HISTORY:   Social History  Substance Use Topics  . Smoking status: Former Smoker    Packs/day: 0.75    Years: 40.00    Types: Cigarettes    Quit date: 11/07/2003  .  Smokeless tobacco: Never Used  . Alcohol use 4.2 oz/week    7 Glasses of wine per week     Comment: 1-3 a day WINE    FAMILY HISTORY:   Family History  Problem Relation Age of Onset  . Heart failure Mother   . Epilepsy Mother   . COPD Father   . Heart disease Father   . Anxiety disorder Sister   . Arthritis Brother   . Heart disease Brother   . Vaginal cancer Paternal Grandmother   . Heart attack Paternal Grandfather   . COPD Brother   . Kidney failure Brother   . COPD Brother   . Arthritis Sister   . Uterine  cancer    . Diabetes    . Colon cancer Neg Hx   . Stomach cancer Neg Hx   . Breast cancer Neg Hx     DRUG ALLERGIES:   Allergies  Allergen Reactions  . Diuretic  [Buchu-Cornsilk-Ch Grass-Hydran]     hyponatremia  . Furosemide     hives  . Other     SSRI---hyponatremia  . Pimenta Nausea And Vomiting    REVIEW OF SYSTEMS:   Review of Systems  Constitutional: Positive for malaise/fatigue. Negative for chills and fever.  HENT: Negative.  Negative for ear discharge, ear pain, hearing loss, nosebleeds and sore throat.   Eyes: Negative.  Negative for blurred vision and pain.  Respiratory: Positive for shortness of breath. Negative for cough, hemoptysis and wheezing.   Cardiovascular: Positive for chest pain. Negative for palpitations and leg swelling.  Gastrointestinal: Positive for diarrhea, nausea and vomiting. Negative for abdominal pain and blood in stool.  Genitourinary: Negative.  Negative for dysuria.  Musculoskeletal: Negative.  Negative for back pain.  Skin: Negative.   Neurological: Positive for weakness. Negative for dizziness, tremors, speech change, focal weakness, seizures and headaches.  Endo/Heme/Allergies: Negative.  Does not bruise/bleed easily.  Psychiatric/Behavioral: Negative.  Negative for depression, hallucinations and suicidal ideas.    MEDICATIONS AT HOME:   Prior to Admission medications   Medication Sig Start Date End Date Taking? Authorizing Provider  albuterol (PROVENTIL) (2.5 MG/3ML) 0.083% nebulizer solution Take 3 mLs (2.5 mg total) by nebulization every 6 (six) hours as needed for wheezing or shortness of breath. 06/06/15   Mar Daring, PA-C  alendronate (FOSAMAX) 70 MG tablet Take 1 tablet (70 mg total) by mouth every 7 (seven) days. Take with a full glass of water on an empty stomach. 11/07/15   Richard Maceo Pro., MD  ALPRAZolam Duanne Moron) 0.5 MG tablet Take 1 tablet (0.5 mg total) by mouth every morning. 1/2 tablet twice daily as needed  02/08/16   Jerrol Banana., MD  amLODipine-olmesartan (AZOR) 10-40 MG tablet Take 1 tablet by mouth as needed.  08/08/12   Historical Provider, MD  aspirin 81 MG tablet Take by mouth. 11/26/10   Historical Provider, MD  busPIRone (BUSPAR) 5 MG tablet 1 Tablet, Oral QHS 12/26/15   Robert Bellow, MD  Calcium-Magnesium-Vitamin D (CALCIUM MAGNESIUM PO) Take by mouth.     Historical Provider, MD  cetirizine (ZYRTEC) 10 MG tablet Take 10 mg by mouth as needed.     Historical Provider, MD  cholecalciferol (VITAMIN D) 1000 units tablet Take 1,000 Units by mouth daily.    Historical Provider, MD  fexofenadine (ALLEGRA) 180 MG tablet Take 180 mg by mouth daily.    Historical Provider, MD  fluticasone (FLONASE) 50 MCG/ACT nasal spray Place 2 sprays into the  nose as needed.  11/26/10   Historical Provider, MD  Glucosamine-Chondroit-Vit C-Mn (GLUCOSAMINE 1500 COMPLEX PO) Take by mouth.    Historical Provider, MD  ibuprofen (ADVIL,MOTRIN) 200 MG tablet Take 200 mg by mouth every 6 (six) hours as needed.      Historical Provider, MD  loratadine (CLARITIN) 10 MG tablet Take 10 mg by mouth daily.    Historical Provider, MD  metoprolol succinate (TOPROL-XL) 25 MG 24 hr tablet Take 1 tablet (25 mg total) by mouth daily. Patient taking differently: Take 25 mg by mouth every morning.  01/03/16   Richard Maceo Pro., MD  Omega-3 Fatty Acids (FISH OIL) 1000 MG CAPS Take 1 capsule by mouth.    Historical Provider, MD  omeprazole (PRILOSEC) 20 MG capsule Take 20 mg by mouth as needed.     Historical Provider, MD  promethazine (PHENERGAN) 12.5 MG tablet Take 1 tablet (12.5 mg total) by mouth every 8 (eight) hours as needed for nausea or vomiting. 07/15/16   Trinna Post, PA-C  psyllium (METAMUCIL SMOOTH TEXTURE) 28 % packet Take 1 packet by mouth 2 (two) times daily.    Historical Provider, MD  tamoxifen (NOLVADEX) 20 MG tablet Take 1 tablet (20 mg total) by mouth daily. 06/09/16   Lequita Asal, MD       VITAL SIGNS:  Blood pressure (!) 156/82, pulse 76, temperature 98 F (36.7 C), temperature source Oral, resp. rate 20, height 5\' 2"  (1.575 m), weight 66.7 kg (147 lb), SpO2 100 %.  PHYSICAL EXAMINATION:   Physical Exam  Constitutional: She is oriented to person, place, and time and well-developed, well-nourished, and in no distress. No distress.  HENT:  Head: Normocephalic.  Eyes: No scleral icterus.  Neck: Normal range of motion. Neck supple. No JVD present. No tracheal deviation present.  Cardiovascular: Normal rate, regular rhythm and normal heart sounds.  Exam reveals no gallop and no friction rub.   No murmur heard. Pulmonary/Chest: Effort normal and breath sounds normal. No respiratory distress. She has no wheezes. She has no rales. She exhibits no tenderness.  Abdominal: Soft. Bowel sounds are normal. She exhibits no distension and no mass. There is no tenderness. There is no rebound and no guarding.  Musculoskeletal: Normal range of motion. She exhibits no edema.  Neurological: She is alert and oriented to person, place, and time.  Skin: Skin is warm. No rash noted. No erythema.  Psychiatric: Affect and judgment normal.      LABORATORY PANEL:   CBC  Recent Labs Lab 07/16/16 0602  WBC 12.6*  HGB 10.9*  HCT 31.7*  PLT 253   ------------------------------------------------------------------------------------------------------------------  Chemistries   Recent Labs Lab 07/16/16 0602  NA 123*  K 2.9*  CL 93*  CO2 24  GLUCOSE 107*  BUN 14  CREATININE 1.01*  CALCIUM 8.1*   ------------------------------------------------------------------------------------------------------------------  Cardiac Enzymes  Recent Labs Lab 07/16/16 0602  TROPONINI 0.18*   ------------------------------------------------------------------------------------------------------------------  RADIOLOGY:  Dg Chest 2 View  Result Date: 07/16/2016 CLINICAL DATA:  71 year old  female with shortness of breath EXAM: CHEST  2 VIEW COMPARISON:  Chest radiograph dated 10/28/2015 and CT dated 11/01/2015 FINDINGS: The lungs are clear. A 5 mm nodular density in the lower lung field likely correspond to the calcified nodule seen on the prior CT. There is no pleural effusion or pneumothorax. The cardiac silhouette is within normal limits with no acute osseous pathology. IMPRESSION: No active cardiopulmonary disease. Electronically Signed   By: Laren Everts.D.  On: 07/16/2016 06:29    EKG:  Sinus rhythm with PR depressions in inferior leads  IMPRESSION AND PLAN:   71 year old female with a history of essential hypertension, chronic hyponatremia and breast cancer on tamoxifen therapy who presented yesterday to PCPs office due to weakness, nausea and vomiting and chest pressure and found to have elevated troponin.  1. Elevated troponin with pleuritic chest pain: Cardiology consultation requested CT scan to evaluate for pulmonary emboli Echocardiogram to evaluate for pericarditis Full dose heparin until further troponins and cardiology consult. Continue aspirin Check lipid panel Continue metoprolol.  2. Hyponatremia with history of chronic hyponatremia: Gentle IV fluids with potassium supplementation Consult nephrology who is followed patient for chronic hyponatremia. Monitor Na every 6 hours Check TSH level  3. Hypokalemia from nausea and vomiting: Check magnesium level and replete potassium. Recheck in a.m.  4. Essential hypertension: Continue metoprolol  5. History of breast cancer with left vasectomy: Continue tamoxifen  6. Anemia of chronic disease: Hemoglobin is stable  7. History of scleroderma  All the records are reviewed and case discussed with ED provider. Management plans discussed with the patient and she is in agreement  CODE STATUS: FULL  TOTAL TIME TAKING CARE OF THIS PATIENT: 45 minutes.    Mekhi Sonn M.D on 07/16/2016 at 7:36  AM  Between 7am to 6pm - Pager - [phone]  After 6pm go to [email or web address] - password [email or web address]  Office  [phone]  CC: Primary care physician; Wilhemena Durie, MD

## 2016-07-16 NOTE — Progress Notes (Signed)
ANTICOAGULATION CONSULT NOTE - Initial Consult  Pharmacy Consult for Heparin  Indication: ACS  Allergies  Allergen Reactions  . Diuretic  [Buchu-Cornsilk-Ch Grass-Hydran]     hyponatremia  . Furosemide     hives  . Other     SSRI---hyponatremia  . Pimenta Nausea And Vomiting    Patient Measurements: Height: 5' 1.5" (156.2 cm) Weight: 147 lb 1.6 oz (66.7 kg) IBW/kg (Calculated) : 48.95 Heparin Dosing Weight:   Vital Signs: Temp: 97.2 F (36.2 C) (03/22 1111) Temp Source: Oral (03/22 0551) BP: 174/75 (03/22 1111) Pulse Rate: 72 (03/22 1111)  Labs:  Recent Labs  07/15/16 1106 07/16/16 0602 07/16/16 1212  HGB  --  10.9*  --   HCT 30.7* 31.7*  --   PLT 224 253  --   CREATININE 1.01* 1.01*  --   TROPONINI 1.47* 0.18* 0.13*    Estimated Creatinine Clearance: 45.2 mL/min (A) (by C-G formula based on SCr of 1.01 mg/dL (H)).   Medical History: Past Medical History:  Diagnosis Date  . Anemia   . Anxiety disorder   . Breast cancer of upper-outer quadrant of left female breast ([place]) 11/2015   pT2 pN0(i+).;ER+; PR +, her 2 neu not overexpressed.  Mastectomy, SLN, Mammoprint: Low risk.   . Cancer ([place]) 12/03/2015   left breast/ INVASIVE LOBULAR CARCINOMA.   . Cough    lingering, mild, finished Prednisone and anitbiotic 11/03/15  . Family history of adverse reaction to anesthesia    sister - PONV  . GERD (gastroesophageal reflux disease)   . H/O: hysterectomy   . Hypertension   . Osteopenia   . Osteoporosis   . Pericarditis    diagnonsed June, 2010, unclear etiology as of yer  . Personal history of tobacco use, presenting hazards to health 10/31/2015  . Scleroderma (HCC)    ONLY ON SKIN-MILD  . UTI (lower urinary tract infection)   . Wears dentures    full upper    Medications:  Prescriptions Prior to Admission  Medication Sig Dispense Refill Last Dose  . ALPRAZolam (XANAX) 0.5 MG tablet Take 1 tablet (0.5 mg total) by mouth every morning. 1/2 tablet twice  daily as needed 30 tablet 3 07/14/2016 at 0800  . amLODipine-olmesartan (AZOR) 10-40 MG tablet Take 1 tablet by mouth as needed.    07/14/2016 at 0800  . busPIRone (BUSPAR) 5 MG tablet 1 Tablet, Oral QHS 60 tablet 2 07/14/2016 at 2000  . metoprolol succinate (TOPROL-XL) 25 MG 24 hr tablet Take 1 tablet (25 mg total) by mouth daily. (Patient taking differently: Take 25 mg by mouth every morning. ) 30 tablet 12 07/14/2016 at 0800  . tamoxifen (NOLVADEX) 20 MG tablet Take 1 tablet (20 mg total) by mouth daily. 90 tablet 3 07/14/2016 at 0800  . albuterol (PROVENTIL) (2.5 MG/3ML) 0.083% nebulizer solution Take 3 mLs (2.5 mg total) by nebulization every 6 (six) hours as needed for wheezing or shortness of breath. 75 mL 12 prn at prn  . aspirin 81 MG tablet Take 81 mg by mouth daily.    07/14/2016 at 0800  . fluticasone (FLONASE) 50 MCG/ACT nasal spray Place 2 sprays into the nose as needed.    prn at prn  . ibuprofen (ADVIL,MOTRIN) 200 MG tablet Take 200 mg by mouth every 6 (six) hours as needed.     prn at prn  . omeprazole (PRILOSEC) 20 MG capsule Take 20 mg by mouth as needed.    prn at prn   Scheduled:  .  ALPRAZolam  0.5 mg Oral BH-q7a  . aspirin  81 mg Oral Daily  . cholecalciferol  1,000 Units Oral Daily  . heparin  3,700 Units Intravenous Once  . loratadine  10 mg Oral Daily  . metoprolol succinate  25 mg Oral BH-q7a  . potassium chloride (KCL MULTIRUN) 30 mEq in 265 mL IVPB  30 mEq Intravenous Once  . potassium phosphate IVPB (mmol)  15 mmol Intravenous Once  . psyllium  1 packet Oral BID  . sodium chloride flush  3 mL Intravenous Q12H  . tamoxifen  20 mg Oral Daily    Assessment: Pharmacy consulted to dose and monitor heparin in this 71 year old woman for NSTEMI Goal of Therapy:  Heparin level 0.3-0.7 units/ml Monitor platelets by anticoagulation protocol: Yes   Plan:  Give Heparin bolus 3700 units x 1. Will start heparin gtt @ 800 units/hr. Will check Heparin level @ 21:30.     Bianca Shaw D 07/16/2016,1:24 PM

## 2016-07-16 NOTE — ED Provider Notes (Signed)
[facility] Emergency Department Provider Note    First MD Initiated Contact with Patient 07/16/16 [phone]     (approximate)  I have reviewed the triage vital signs and the nursing notes.   HISTORY  Chief Complaint No chief complaint on file.    HPI Bianca Shaw is a 71 y.o. female with blow list of chronic medical conditions including pericarditis presents to the emergency department with referral from Dr. Keith Rake for repeat troponin. Per patient lab data obtained yesterday revealed a troponin of "14". Patient was notified of this at 4 AM this morning and advised to present to the emergency department. Patient denies any chest pain however does admit to shortness of breath. Patient denies any lower extremity pain or swelling. Patient denies any palpitations nausea or vomiting or diaphoresis. Patient states that symptoms consistent with previous episode of pericarditis.   Past Medical History:  Diagnosis Date  . Anemia   . Anxiety disorder   . Breast cancer of upper-outer quadrant of left female breast ([place]) 11/2015   pT2 pN0(i+).;ER+; PR +, her 2 neu not overexpressed.  Mastectomy, SLN, Mammoprint: Low risk.   . Cancer ([place]) 12/03/2015   left breast/ INVASIVE LOBULAR CARCINOMA.   . Cough    lingering, mild, finished Prednisone and anitbiotic 11/03/15  . Family history of adverse reaction to anesthesia    sister - PONV  . GERD (gastroesophageal reflux disease)   . H/O: hysterectomy   . Hypertension   . Osteopenia   . Osteoporosis   . Pericarditis    diagnonsed June, 2010, unclear etiology as of yer  . Personal history of tobacco use, presenting hazards to health 10/31/2015  . Scleroderma (HCC)    ONLY ON SKIN-MILD  . UTI (lower urinary tract infection)   . Wears dentures    full upper    Patient Active Problem List   Diagnosis Date Noted  . Hematoma (nontraumatic) of breast 01/13/2016  . Scleroderma ([place]) 12/12/2015  . Breast cancer of  upper-outer quadrant of left female breast ([place]) 12/06/2015  . Special screening for malignant neoplasms, colon   . Personal history of tobacco use, presenting hazards to health 10/31/2015  . Anxiety 05/23/2015  . CAFL (chronic airflow limitation) ([place]) 05/23/2015  . Bloodgood disease 05/23/2015  . Essential (primary) hypertension 05/23/2015  . Acid reflux 05/23/2015  . Hypercholesteremia 05/23/2015  . Below normal amount of sodium in the blood 05/23/2015  . Osteoporosis 05/23/2015  . Allergic rhinitis, seasonal 05/23/2015  . HYPERTENSION, BENIGN 12/26/2008  . UNSPECIFIED ANEMIA 10/03/2008    Past Surgical History:  Procedure Laterality Date  . BREAST BIOPSY Right 2012   core - neg  . BREAST BIOPSY Left 12/03/2015   INVASIVE LOBULAR CARCINOMA.   Marland Kitchen CATARACT EXTRACTION W/ INTRAOCULAR LENS IMPLANT Right   . COLONOSCOPY WITH PROPOFOL N/A 11/08/2015   Procedure: COLONOSCOPY WITH PROPOFOL;  Surgeon: Lucilla Lame, MD;  Location: [place];  Service: Endoscopy;  Laterality: N/A;  . EVACUATION BREAST HEMATOMA Left 01/14/2016   Procedure: EVACUATION HEMATOMA BREAST;  Surgeon: Robert Bellow, MD;  Location: [facility];  Service: General;  Laterality: Left;  Marland Kitchen MASTECTOMY W/ SENTINEL NODE BIOPSY Left 12/26/2015   Procedure: MASTECTOMY WITH SENTINEL LYMPH NODE BIOPSY;  Surgeon: Robert Bellow, MD;  Location: [facility];  Service: General;  Laterality: Left;  . TUBAL LIGATION    . VESICOVAGINAL FISTULA CLOSURE W/ TAH      Prior to Admission medications   Medication Sig  Start Date End Date Taking? Authorizing Provider  albuterol (PROVENTIL) (2.5 MG/3ML) 0.083% nebulizer solution Take 3 mLs (2.5 mg total) by nebulization every 6 (six) hours as needed for wheezing or shortness of breath. 06/06/15   Mar Daring, PA-C  alendronate (FOSAMAX) 70 MG tablet Take 1 tablet (70 mg total) by mouth every 7 (seven) days. Take with a full glass of water on an empty stomach. 11/07/15   Richard Maceo Pro., MD  ALPRAZolam Duanne Moron) 0.5 MG tablet Take 1 tablet (0.5 mg total) by mouth every morning. 1/2 tablet twice daily as needed 02/08/16   Jerrol Banana., MD  amLODipine-olmesartan (AZOR) 10-40 MG tablet Take 1 tablet by mouth as needed.  08/08/12   Historical Provider, MD  aspirin 81 MG tablet Take by mouth. 11/26/10   Historical Provider, MD  busPIRone (BUSPAR) 5 MG tablet 1 Tablet, Oral QHS 12/26/15   Robert Bellow, MD  Calcium-Magnesium-Vitamin D (CALCIUM MAGNESIUM PO) Take by mouth.     Historical Provider, MD  cetirizine (ZYRTEC) 10 MG tablet Take 10 mg by mouth as needed.     Historical Provider, MD  cholecalciferol (VITAMIN D) 1000 units tablet Take 1,000 Units by mouth daily.    Historical Provider, MD  fexofenadine (ALLEGRA) 180 MG tablet Take 180 mg by mouth daily.    Historical Provider, MD  fluticasone (FLONASE) 50 MCG/ACT nasal spray Place 2 sprays into the nose as needed.  11/26/10   Historical Provider, MD  Glucosamine-Chondroit-Vit C-Mn (GLUCOSAMINE 1500 COMPLEX PO) Take by mouth.    Historical Provider, MD  ibuprofen (ADVIL,MOTRIN) 200 MG tablet Take 200 mg by mouth every 6 (six) hours as needed.      Historical Provider, MD  loratadine (CLARITIN) 10 MG tablet Take 10 mg by mouth daily.    Historical Provider, MD  metoprolol succinate (TOPROL-XL) 25 MG 24 hr tablet Take 1 tablet (25 mg total) by mouth daily. Patient taking differently: Take 25 mg by mouth every morning.  01/03/16   Richard Maceo Pro., MD  Omega-3 Fatty Acids (FISH OIL) 1000 MG CAPS Take 1 capsule by mouth.    Historical Provider, MD  omeprazole (PRILOSEC) 20 MG capsule Take 20 mg by mouth as needed.     Historical Provider, MD  promethazine (PHENERGAN) 12.5 MG tablet Take 1 tablet (12.5 mg total) by mouth every 8 (eight) hours as needed for nausea or vomiting. 07/15/16   Trinna Post, PA-C  psyllium (METAMUCIL SMOOTH TEXTURE) 28 % packet Take 1 packet by mouth 2 (two) times daily.    Historical  Provider, MD  tamoxifen (NOLVADEX) 20 MG tablet Take 1 tablet (20 mg total) by mouth daily. 06/09/16   Lequita Asal, MD    Allergies Diuretic  [buchu-cornsilk-ch grass-hydran]; Furosemide; Other; and Pimenta  Family History  Problem Relation Age of Onset  . Heart failure Mother   . Epilepsy Mother   . COPD Father   . Heart disease Father   . Anxiety disorder Sister   . Arthritis Brother   . Heart disease Brother   . Vaginal cancer Paternal Grandmother   . Heart attack Paternal Grandfather   . COPD Brother   . Kidney failure Brother   . COPD Brother   . Arthritis Sister   . Uterine cancer    . Diabetes    . Colon cancer Neg Hx   . Stomach cancer Neg Hx   . Breast cancer Neg Hx     Social  History Social History  Substance Use Topics  . Smoking status: Former Smoker    Packs/day: 0.75    Years: 40.00    Types: Cigarettes    Quit date: 11/07/2003  . Smokeless tobacco: Never Used  . Alcohol use 4.2 oz/week    7 Glasses of wine per week     Comment: 1-3 a day WINE    Review of Systems Constitutional: No fever/chills Eyes: No visual changes. ENT: No sore throat. Cardiovascular: Denies chest pain. Respiratory: Positive for shortness of breath. Gastrointestinal: No abdominal pain.  No nausea, no vomiting.  No diarrhea.  No constipation. Genitourinary: Negative for dysuria. Musculoskeletal: Negative for back pain. Skin: Negative for rash. Neurological: Negative for headaches, focal weakness or numbness.  10-point ROS otherwise negative.  ____________________________________________   PHYSICAL EXAM:  VITAL SIGNS: ED Triage Vitals [07/16/16 0551]  Enc Vitals Group     BP (!) 156/81     Pulse Rate 86     Resp 16     Temp 98 F (36.7 C)     Temp Source Oral     SpO2 100 %     Weight 147 lb (66.7 kg)     Height 5\' 2"  (1.575 m)     Head Circumference      Peak Flow      Pain Score      Pain Loc      Pain Edu?      Excl. in [place]?     Constitutional:  Alert and oriented. Apparent dyspnea Eyes: Conjunctivae are normal. PERRL. EOMI. Head: Atraumatic. Mouth/Throat: Mucous membranes are moist. Oropharynx non-erythematous. Neck: No stridor.   Cardiovascular: Normal rate, regular rhythm. Good peripheral circulation. Grossly normal heart sounds. Respiratory: Normal respiratory effort.  No retractions. Lungs CTAB. Gastrointestinal: Soft and nontender. No distention.  Musculoskeletal: No lower extremity tenderness nor edema. No gross deformities of extremities. Neurologic:  Normal speech and language. No gross focal neurologic deficits are appreciated.  Skin:  Skin is warm, dry and intact. No rash noted. Psychiatric: Mood and affect are normal. Speech and behavior are normal.  ____________________________________________   LABS (all labs ordered are listed, but only abnormal results are displayed)  Labs Reviewed  BASIC METABOLIC PANEL - Abnormal; Notable for the following:       Result Value   Sodium 123 (*)    Potassium 2.9 (*)    Chloride 93 (*)    Glucose, Bld 107 (*)    Creatinine, Ser 1.01 (*)    Calcium 8.1 (*)    GFR calc non Af Amer 55 (*)    All other components within normal limits  CBC - Abnormal; Notable for the following:    WBC 12.6 (*)    RBC 3.22 (*)    Hemoglobin 10.9 (*)    HCT 31.7 (*)    All other components within normal limits  TROPONIN I - Abnormal; Notable for the following:    Troponin I 0.18 (*)    All other components within normal limits  BRAIN NATRIURETIC PEPTIDE  INFLUENZA PANEL BY PCR (TYPE A & B)   ____________________________________________  EKG  ED ECG REPORT I, [place] N Jamal Haskin, the attending physician, personally viewed and interpreted this ECG.   Date: 07/16/2016  EKG Time: 5:59 AM  Rate: 80  Rhythm: Normal sinus rhythm PR depression in inferior leads  Axis: Normal  Intervals: Normal  ST&T Change: None  ____________________________________________  RADIOLOGY I, [place] N  Matayah Reyburn, personally viewed and evaluated these  images (plain radiographs) as part of my medical decision making, as well as reviewing the written report by the radiologist.  Dg Chest 2 View  Result Date: 07/16/2016 CLINICAL DATA:  71 year old female with shortness of breath EXAM: CHEST  2 VIEW COMPARISON:  Chest radiograph dated 10/28/2015 and CT dated 11/01/2015 FINDINGS: The lungs are clear. A 5 mm nodular density in the lower lung field likely correspond to the calcified nodule seen on the prior CT. There is no pleural effusion or pneumothorax. The cardiac silhouette is within normal limits with no acute osseous pathology. IMPRESSION: No active cardiopulmonary disease. Electronically Signed   By: Anner Crete M.D.   On: 07/16/2016 06:29     Procedures   ____________________________________________   INITIAL IMPRESSION / ASSESSMENT AND PLAN / ED COURSE  Pertinent labs & imaging results that were available during my care of the patient were reviewed by me and considered in my medical decision making (see chart for details).  History physical exam concerning for potential cardiac etiology for the patient's shortness of breath. Patient's troponin stated to be "14 yesterday". Troponin today 0.18. Concern for possible pericarditis given PR depression. Also concern for possible CAD with dyspnea being the patient's anginal equivalent. As such patient discussed with Dr. Benjie Karvonen for hospital admission for further evaluation and management. Patient was given aspirin 324 mg in the emergency department      ____________________________________________  FINAL CLINICAL IMPRESSION(S) / ED DIAGNOSES  Final diagnoses:  Dyspnea, unspecified type     MEDICATIONS GIVEN DURING THIS VISIT:  Medications  sodium chloride 0.9 % bolus 500 mL (not administered)  potassium chloride SA (K-DUR,KLOR-CON) CR tablet 20 mEq (not administered)  aspirin chewable tablet 324 mg (324 mg Oral Given 07/16/16 0700)      NEW OUTPATIENT MEDICATIONS STARTED DURING THIS VISIT:  New Prescriptions   No medications on file    Modified Medications   No medications on file    Discontinued Medications   No medications on file     Note:  This document was prepared using Dragon voice recognition software and may include unintentional dictation errors.    Gregor Hams, MD 07/16/16 2236

## 2016-07-17 ENCOUNTER — Encounter
Admission: EM | Disposition: A | Discharge status: Home / Self Care | Payer: Self-pay | Source: Home / Self Care | Attending: Internal Medicine

## 2016-07-17 ENCOUNTER — Telehealth: Payer: Self-pay | Admitting: Family Medicine

## 2016-07-17 LAB — BASIC METABOLIC PANEL
Anion gap: 5 (ref 5–15)
BUN: 10 mg/dL (ref 6–20)
CHLORIDE: 104 mmol/L (ref 101–111)
CO2: 22 mmol/L (ref 22–32)
Calcium: 7.6 mg/dL — ABNORMAL LOW (ref 8.9–10.3)
Creatinine, Ser: 0.89 mg/dL (ref 0.44–1.00)
GFR calc Af Amer: >60 mL/min (ref >60)
GFR calc non Af Amer: >60 mL/min (ref >60)
Glucose, Bld: 100 mg/dL — ABNORMAL HIGH (ref 65–99)
POTASSIUM: 3.7 mmol/L (ref 3.5–5.1)
SODIUM: 131 mmol/L — AB (ref 135–145)

## 2016-07-17 LAB — CBC
HEMATOCRIT: 27.3 % — AB (ref 35.0–47.0)
Hemoglobin: 9.4 g/dL — ABNORMAL LOW (ref 12.0–16.0)
MCH: 34.2 pg — AB (ref 26.0–34.0)
MCHC: 34.6 g/dL (ref 32.0–36.0)
MCV: 98.6 fL (ref 80.0–100.0)
Platelets: 234 10*3/uL (ref 150–440)
RBC: 2.76 MIL/uL — AB (ref 3.80–5.20)
RDW: 13 % (ref 11.5–14.5)
WBC: 7.6 10*3/uL (ref 3.6–11.0)

## 2016-07-17 LAB — SODIUM: Sodium: 132 mmol/L — ABNORMAL LOW (ref 135–145)

## 2016-07-17 LAB — HEPARIN LEVEL (UNFRACTIONATED): Heparin Unfractionated: 0.49 IU/mL (ref 0.30–0.70)

## 2016-07-17 SURGERY — RIGHT AND LEFT HEART CATH
Anesthesia: Moderate Sedation | Laterality: Bilateral

## 2016-07-17 MED ORDER — ISOSORBIDE MONONITRATE ER 30 MG PO TB24
30.0000 mg | ORAL_TABLET | Freq: Every day | ORAL | Status: DC
  Administered 2016-07-17: 30 mg via ORAL
  Filled 2016-07-17: qty 1

## 2016-07-17 MED ORDER — IRBESARTAN 150 MG PO TABS
150.0000 mg | ORAL_TABLET | Freq: Every day | ORAL | Status: DC
  Administered 2016-07-17: 10:00:00 150 mg via ORAL
  Filled 2016-07-17: qty 1

## 2016-07-17 MED ORDER — AMLODIPINE BESYLATE 10 MG PO TABS
10.0000 mg | ORAL_TABLET | Freq: Every day | ORAL | Status: DC
  Administered 2016-07-17: 10 mg via ORAL
  Filled 2016-07-17: qty 1

## 2016-07-17 MED ORDER — NITROGLYCERIN 0.4 MG SL SUBL: 0.4000 mg | SUBLINGUAL_TABLET | SUBLINGUAL | 0 refills | Status: DC | PRN

## 2016-07-17 MED ORDER — FUROSEMIDE 20 MG PO TABS: 20.0000 mg | ORAL_TABLET | Freq: Every day | ORAL | 0 refills | Status: DC | PRN

## 2016-07-17 MED ORDER — SIMVASTATIN 40 MG PO TABS: 40.0000 mg | ORAL_TABLET | Freq: Every day | ORAL | 0 refills | Status: DC

## 2016-07-17 MED ORDER — ISOSORBIDE MONONITRATE ER 30 MG PO TB24: 30.0000 mg | ORAL_TABLET | Freq: Every day | ORAL | 0 refills | Status: DC

## 2016-07-17 NOTE — Progress Notes (Signed)
Patient discharged home per MD order. All discharge instructions given and all questions answered. Patient verbalizes understanding of all discharge orders.

## 2016-07-17 NOTE — Telephone Encounter (Signed)
Please call for transition of care. Thank you-aa

## 2016-07-17 NOTE — Telephone Encounter (Signed)
[facility] called to schedule pt a Hospital F/U. Pt is being discharged today and is scheduled to see Dr. Rosanna Randy on Wednesday 245 pm. Thanks TNP

## 2016-07-17 NOTE — Progress Notes (Signed)
Progress Note  Patient Name: Bianca Shaw Date of Encounter: 07/17/2016  Primary Cardiologist: Rockey Situ, tim  Subjective   She reports that she feels better this morning, denies any chest pain in the past 24 hours Still recovering from her GI bug, feels tired Did not sleep well last night Ambulating to the bathroom without any symptoms Long discussion concerning catheterization lab came her being broken She has indicated she would like to go home, schedule he catheterization as an outpatient  Inpatient Medications    Scheduled Meds: . ALPRAZolam  0.5 mg Oral BH-q7a  . amLODipine  10 mg Oral Daily  . aspirin  81 mg Oral Daily  . cholecalciferol  1,000 Units Oral Daily  . irbesartan  150 mg Oral Daily  . isosorbide mononitrate  30 mg Oral Daily  . loratadine  10 mg Oral Daily  . metoprolol succinate  25 mg Oral BH-q7a  . phosphorus  500 mg Oral QID  . psyllium  1 packet Oral BID  . sodium chloride flush  3 mL Intravenous Q12H  . tamoxifen  20 mg Oral Daily   Continuous Infusions:  PRN Meds: acetaminophen **OR** acetaminophen, albuterol, hydrALAZINE, ondansetron **OR** ondansetron (ZOFRAN) IV, oxyCODONE, pantoprazole, promethazine, senna-docusate   Vital Signs    Vitals:   07/16/16 1000 07/16/16 1111 07/16/16 2140 07/17/16 0441  BP: (!) 168/92 (!) 174/75 (!) 167/74 (!) 165/58  Pulse: 74 72 73 69  Resp: (!) 26 18 20 20   Temp:  97.2 F (36.2 C) 97.9 F (36.6 C)   TempSrc:   Oral   SpO2: 99% 100% 99% 96%  Weight:  147 lb 1.6 oz (66.7 kg)    Height:  5' 1.5" (1.562 m)      Intake/Output Summary (Last 24 hours) at 07/17/16 1349 Last data filed at 07/17/16 1046  Gross per 24 hour  Intake             2763 ml  Output                0 ml  Net             2763 ml   Filed Weights   07/16/16 0551 07/16/16 1111  Weight: 147 lb (66.7 kg) 147 lb 1.6 oz (66.7 kg)    Telemetry    Normal sinus rhythm - Personally Reviewed ECG      Physical Exam  Telemetry  reviewed showing normal sinus rhythm GEN: No acute distress.  Neck: No JVD  Cardiac: RRR, no murmurs, rubs, or gallops.  Radials/DP/PT 2+ and equal bilaterally.  Respiratory:  Clear to auscultation bilaterally. GI: Soft, nontender, non-distended  MS: no deformity; no edema Neuro:  Alert and oriented x 3  Labs    Chemistry Recent Labs Lab 07/15/16 1106 07/16/16 0602  07/16/16 2208 07/17/16 0153 07/17/16 0858  NA 125* 123*  < > 128* 131* 132*  K 3.6 2.9*  --  4.1 3.7  --   CL 89* 93*  --   --  104  --   CO2 21 24  --   --  22  --   GLUCOSE 98 107*  --   --  100*  --   BUN 11 14  --   --  10  --   CREATININE 1.01* 1.01*  --   --  0.89  --   CALCIUM 8.5* 8.1*  --   --  7.6*  --   PROT 7.1  --   --   --   --   --  ALBUMIN 4.0  --   --   --   --   --   AST 34  --   --   --   --   --   ALT 11  --   --   --   --   --   ALKPHOS 41  --   --   --   --   --   BILITOT 0.4  --   --   --   --   --   GFRNONAA 56* 55*  --   --  >60  --   GFRAA 65 >60  --   --  >60  --   ANIONGAP  --  6  --   --  5  --   < > = values in this interval not displayed.   Hematology Recent Labs Lab 07/15/16 1106 07/16/16 0602 07/17/16 0153  WBC 14.2* 12.6* 7.6  RBC 3.16* 3.22* 2.76*  HGB  --  10.9* 9.4*  HCT 30.7* 31.7* 27.3*  MCV 97 98.3 98.6  MCH 32.6 33.8 34.2*  MCHC 33.6 34.4 34.6  RDW 12.9 13.1 13.0  PLT 224 253 234    Cardiac Enzymes Recent Labs Lab 07/16/16 0602 07/16/16 1212 07/16/16 1705 07/16/16 2208  TROPONINI 0.18* 0.13* 0.18* 0.13*   No results for input(s): TROPIPOC in the last 168 hours.   BNP Recent Labs Lab 07/15/16 1106 07/16/16 0602  BNP 1,405.0* 835.0*     DDimer No results for input(s): DDIMER in the last 168 hours.   Radiology    Dg Chest 2 View  Result Date: 07/16/2016 CLINICAL DATA:  71 year old female with shortness of breath EXAM: CHEST  2 VIEW COMPARISON:  Chest radiograph dated 10/28/2015 and CT dated 11/01/2015 FINDINGS: The lungs are clear. A 5  mm nodular density in the lower lung field likely correspond to the calcified nodule seen on the prior CT. There is no pleural effusion or pneumothorax. The cardiac silhouette is within normal limits with no acute osseous pathology. IMPRESSION: No active cardiopulmonary disease. Electronically Signed   By: Anner Crete M.D.   On: 07/16/2016 06:29   Ct Angio Chest Pe W Or Wo Contrast  Result Date: 07/16/2016 CLINICAL DATA:  71 year old female with flu like symptoms for 1 week. Shortness of breath and pleuritic chest pain. EXAM: CT ANGIOGRAPHY CHEST WITH CONTRAST TECHNIQUE: Multidetector CT imaging of the chest was performed using the standard protocol during bolus administration of intravenous contrast. Multiplanar CT image reconstructions and MIPs were obtained to evaluate the vascular anatomy. CONTRAST:  75 mL Isovue 370 COMPARISON:  Noncontrast Chest CT 11/01/2015, and earlier FINDINGS: Cardiovascular: Good contrast bolus timing in the pulmonary arterial tree. Very mild respiratory motion artifact in the posterior lower lobes. No focal filling defect identified in the pulmonary arteries to suggest acute pulmonary embolism. Chronic calcified aortic and coronary artery atherosclerosis. Aberrant origin of the right subclavian artery, normal variant. Mild cardiomegaly. No pericardial effusion. Mediastinum/Nodes: Negative.  No lymphadenopathy. Lungs/Pleura: Stable biapical lung scarring. A 7 mm posterior right upper lobe lung subpleural nodule is mildly spiculated but appears stable since 2015. Calcified granuloma along the right minor fissure. Major airways are patent. Trace layering right pleural effusion is new. No left pleural effusion. No other new pulmonary finding. Upper Abdomen: Stable occasional small calcified granulomas in the liver. Negative visible gallbladder, spleen, pancreas, adrenal glands, kidneys and stomach. Diverticulosis at the splenic flexure. Musculoskeletal: No acute osseous abnormality  identified. Status post left mastectomy  since the prior chest CT. No left axillary lymphadenopathy. Review of the MIP images confirms the above findings. IMPRESSION: 1.  No evidence of acute pulmonary embolus. 2. Trace layering right pleural effusion is new since the prior chest CT. But there is no associated acute Pulmonary opacity. Posterior right upper lobe lung nodule remains stable. 3. Calcified aorta and coronary artery atherosclerosis. Electronically Signed   By: Genevie Ann M.D.   On: 07/16/2016 08:06    Cardiac Studies   Echocardiogram performed, reviewed by myself and discussed with the patient showing normal LV function  Patient Profile     71 y.o. female with history of smoking, breast cancer on the left with mastectomy, hyponatremia, hypertension who presents by referral to the hospital by primary care after blood work showing troponin 1.47 in the setting of left-sided chest pain on 07/15/2016. In the emergency room repeat blood work showing troponin trending downward, 0.18  Assessment & Plan    -----NSTEMI Recent left side chest pain, severe coronary disease noted on CT scan Troponin 1.47 in the setting of symptoms as above now trending downward 3 more troponins 0.13 Asymptomatic in the past 24 hours, ambulating without symptoms Unable to perform cardiac catheterization today as camera is broken No estimate as to when this may be fixed Discussed with patient in detail including patients husband at the bedside Discussed risk and benefit of waiting Patient has indicated she would like to do the catheterization as an outpatient Recommended we start her on Imdur 30 mg daily Also provide nitroglycerin sublingual for any further left side chest discomfort concerning for angina Recommended she come back to the hospital for recurrent chest pain not relieved with nitroglycerin Statin will be started, continue aspirin and beta blocker Long discussion with cardiac catheterization lab this  morning including specials recovery  ------ shortness of breath Underlying COPD from long history of smoking,  Normal ejection fraction on echocardiogram Possibly secondary to mildly elevated right heart pressures She reports symptoms are improved today She will take Lasix as needed for any worsening shortness of breath Catheterization next week tentatively scheduled for Wednesday, March 28 at 11:30 Patient to arrive at 10:30 AM Instructions provided to the patient  -----Smoker Underlying COPD, smoking within 40 years  ------ hyponatremia Chronic history Also with low potassium Workup needed either inpatient or outpatient On normal saline and potassium repletion of electrolytes have improved to within normal levels   Total encounter time more than 35 minutes  Greater than 50% was spent in counseling and coordination of care with the patient   Signed, Ida Rogue, MD  07/17/2016, 1:49 PM

## 2016-07-17 NOTE — Discharge Summary (Signed)
[facility] NAME: Bianca Shaw    MR#:  [id number]  DATE OF BIRTH:  07/17/1945  DATE OF ADMISSION:  07/16/2016 ADMITTING PHYSICIAN: Bettey Costa, MD  DATE OF DISCHARGE: 07/17/2016  PRIMARY CARE PHYSICIAN: Wilhemena Durie, MD    ADMISSION DIAGNOSIS:  Pleuritic chest pain [R07.81] Dyspnea, unspecified type [R06.00] Elevated troponin [R74.8]  DISCHARGE DIAGNOSIS:  Principal Problem:   Non-ST elevation (NSTEMI) myocardial infarction [facility]) Active Problems:   Hyponatremia chronic     Hypokalemia   Gastroenteritis   Dyspnea   Smoker    SECONDARY DIAGNOSIS:   Past Medical History:  Diagnosis Date  . Anemia   . Anxiety disorder   . Breast cancer of upper-outer quadrant of left female breast ([place]) 11/2015   pT2 pN0(i+).;ER+; PR +, her 2 neu not overexpressed.  Mastectomy, SLN, Mammoprint: Low risk.   . Cancer ([place]) 12/03/2015   left breast/ INVASIVE LOBULAR CARCINOMA.   . Cough    lingering, mild, finished Prednisone and anitbiotic 11/03/15  . Family history of adverse reaction to anesthesia    sister - PONV  . GERD (gastroesophageal reflux disease)   . H/O: hysterectomy   . Hypertension   . Osteopenia   . Osteoporosis   . Pericarditis    diagnonsed June, 2010, unclear etiology as of yer  . Personal history of tobacco use, presenting hazards to health 10/31/2015  . Scleroderma (HCC)    ONLY ON SKIN-MILD  . UTI (lower urinary tract infection)   . Wears dentures    full upper    HOSPITAL COURSE:   71 year old female with a history of essential hypertension, chronic hyponatremia and breast cancer on tamoxifen therapy who presented yesterday to PCPs office due to weakness, nausea and vomiting and chest pressure and found to have elevated troponin.  1. NSTEMI:Patient had a troponin of 1.47 as an outpatient. She was instructed to come to the ER after this lab report resulted. Her troponins were less then this when she  arrived to the ED. CT scan did show calcification of LAD. Plan was for cardiac catheterization however at this time equipment for cardiac catheterization is not working. Patient was chest pain-free throughout the hospitalization. She will ambulated without chest pain or shortness of breath. It was decided by the patient and cardiology that she would have an outpatient cardiac catheterization on Wednesday at 12:30.  week in light of being asymptomatic. Her echocardiogram also showed no wall motion abnormalities or no valvular abnormalities. Her ejection fraction is 55-60%.  2. 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 Hypokalemia from nausea and vomiting: Electrolytes were repleted 4. Essential hypertension: Continue metoprolol, Azor and Imdur was added for better BP control  5. History of breast cancer with left vasectomy: Continue tamoxifen  6. Anemia of chronic disease: Hemoglobin is stable  7. History of scleroderma   DISCHARGE CONDITIONS AND DIET:   Stable cardiac diet  CONSULTS OBTAINED:  Treatment Team:  Wellington Hampshire, MD Lavonia Dana, MD  DRUG ALLERGIES:   Allergies  Allergen Reactions  . Diuretic  [Buchu-Cornsilk-Ch Grass-Hydran]     hyponatremia  . Furosemide     hives  . Other     SSRI---hyponatremia  . Pimenta Nausea And Vomiting    DISCHARGE MEDICATIONS:   Current Discharge Medication List    START taking these medications   Details  furosemide (LASIX) 20 MG tablet Take 1 tablet (20 mg total)  by mouth daily as needed. for Shortness of breath Qty: 30 tablet, Refills: 0    isosorbide mononitrate (IMDUR) 30 MG 24 hr tablet Take 1 tablet (30 mg total) by mouth daily. Qty: 30 tablet, Refills: 0    nitroGLYCERIN (NITROSTAT) 0.4 MG SL tablet Place 1 tablet (0.4 mg total) under the tongue every 5 (five) minutes as needed for chest pain. Qty: 30 tablet, Refills: 0    simvastatin (ZOCOR) 40 MG tablet Take 1  tablet (40 mg total) by mouth daily. Qty: 30 tablet, Refills: 0      CONTINUE these medications which have NOT CHANGED   Details  ALPRAZolam (XANAX) 0.5 MG tablet Take 1 tablet (0.5 mg total) by mouth every morning. 1/2 tablet twice daily as needed Qty: 30 tablet, Refills: 3    amLODipine-olmesartan (AZOR) 10-40 MG tablet Take 1 tablet by mouth as needed.     busPIRone (BUSPAR) 5 MG tablet 1 Tablet, Oral QHS Qty: 60 tablet, Refills: 2    metoprolol succinate (TOPROL-XL) 25 MG 24 hr tablet Take 1 tablet (25 mg total) by mouth daily. Qty: 30 tablet, Refills: 12    tamoxifen (NOLVADEX) 20 MG tablet Take 1 tablet (20 mg total) by mouth daily. Qty: 90 tablet, Refills: 3   Associated Diagnoses: Malignant neoplasm of upper-outer quadrant of left breast in female, estrogen receptor positive ([place]); Anemia, unspecified type    albuterol (PROVENTIL) (2.5 MG/3ML) 0.083% nebulizer solution Take 3 mLs (2.5 mg total) by nebulization every 6 (six) hours as needed for wheezing or shortness of breath. Qty: 75 mL, Refills: 12   Associated Diagnoses: Chronic obstructive pulmonary disease, unspecified COPD type (HCC)    aspirin 81 MG tablet Take 81 mg by mouth daily.     fluticasone (FLONASE) 50 MCG/ACT nasal spray Place 2 sprays into the nose as needed.     omeprazole (PRILOSEC) 20 MG capsule Take 20 mg by mouth as needed.       STOP taking these medications     ibuprofen (ADVIL,MOTRIN) 200 MG tablet      cholecalciferol (VITAMIN D) 1000 units tablet      loratadine (CLARITIN) 10 MG tablet      promethazine (PHENERGAN) 12.5 MG tablet      psyllium (METAMUCIL SMOOTH TEXTURE) 28 % packet           Today   CHIEF COMPLAINT:  Doing well no CP overnight   VITAL SIGNS:  Blood pressure (!) 165/58, pulse 69, temperature 97.9 F (36.6 C), temperature source Oral, resp. rate 20, height 5' 1.5" (1.562 m), weight 66.7 kg (147 lb 1.6 oz), SpO2 96 %.   REVIEW OF SYSTEMS:  Review of Systems   Constitutional: Negative.  Negative for chills, fever and malaise/fatigue.  HENT: Negative.  Negative for ear discharge, ear pain, hearing loss, nosebleeds and sore throat.   Eyes: Negative.  Negative for blurred vision and pain.  Respiratory: Negative.  Negative for cough, hemoptysis, shortness of breath and wheezing.   Cardiovascular: Negative.  Negative for chest pain, palpitations and leg swelling.  Gastrointestinal: Negative.  Negative for abdominal pain, blood in stool, diarrhea, nausea and vomiting.  Genitourinary: Negative.  Negative for dysuria.  Musculoskeletal: Negative.  Negative for back pain.  Skin: Negative.   Neurological: Negative for dizziness, tremors, speech change, focal weakness, seizures and headaches.  Endo/Heme/Allergies: Negative.  Does not bruise/bleed easily.  Psychiatric/Behavioral: Negative.  Negative for depression, hallucinations and suicidal ideas.     PHYSICAL EXAMINATION:  GENERAL:  71  y.o.-year-old patient lying in the bed with no acute distress.  NECK:  Supple, no jugular venous distention. No thyroid enlargement, no tenderness.  LUNGS: Normal breath sounds bilaterally, no wheezing, rales,rhonchi  No use of accessory muscles of respiration.  CARDIOVASCULAR: S1, S2 normal. No murmurs, rubs, or gallops.  ABDOMEN: Soft, non-tender, non-distended. Bowel sounds present. No organomegaly or mass.  EXTREMITIES: No pedal edema, cyanosis, or clubbing.  PSYCHIATRIC: The patient is alert and oriented x 3.  SKIN: No obvious rash, lesion, or ulcer.   DATA REVIEW:   CBC  Recent Labs Lab 07/17/16 0153  WBC 7.6  HGB 9.4*  HCT 27.3*  PLT 234    Chemistries   Recent Labs Lab 07/15/16 1106  07/16/16 1212  07/17/16 0153 07/17/16 0858  NA 125*  < > 127*  < > 131* 132*  K 3.6  < >  --   < > 3.7  --   CL 89*  < >  --   --  104  --   CO2 21  < >  --   --  22  --   GLUCOSE 98  < >  --   --  100*  --   BUN 11  < >  --   --  10  --   CREATININE 1.01*  <  >  --   --  0.89  --   CALCIUM 8.5*  < >  --   --  7.6*  --   MG  --   --  1.8  --   --   --   AST 34  --   --   --   --   --   ALT 11  --   --   --   --   --   ALKPHOS 41  --   --   --   --   --   BILITOT 0.4  --   --   --   --   --   < > = values in this interval not displayed.  Cardiac Enzymes  Recent Labs Lab 07/16/16 1212 07/16/16 1705 07/16/16 2208  TROPONINI 0.13* 0.18* 0.13*    Microbiology Results  @[email or web address] @  RADIOLOGY:  Dg Chest 2 View  Result Date: 07/16/2016 CLINICAL DATA:  71 year old female with shortness of breath EXAM: CHEST  2 VIEW COMPARISON:  Chest radiograph dated 10/28/2015 and CT dated 11/01/2015 FINDINGS: The lungs are clear. A 5 mm nodular density in the lower lung field likely correspond to the calcified nodule seen on the prior CT. There is no pleural effusion or pneumothorax. The cardiac silhouette is within normal limits with no acute osseous pathology. IMPRESSION: No active cardiopulmonary disease. Electronically Signed   By: Anner Crete M.D.   On: 07/16/2016 06:29   Ct Angio Chest Pe W Or Wo Contrast  Result Date: 07/16/2016 CLINICAL DATA:  71 year old female with flu like symptoms for 1 week. Shortness of breath and pleuritic chest pain. EXAM: CT ANGIOGRAPHY CHEST WITH CONTRAST TECHNIQUE: Multidetector CT imaging of the chest was performed using the standard protocol during bolus administration of intravenous contrast. Multiplanar CT image reconstructions and MIPs were obtained to evaluate the vascular anatomy. CONTRAST:  75 mL Isovue 370 COMPARISON:  Noncontrast Chest CT 11/01/2015, and earlier FINDINGS: Cardiovascular: Good contrast bolus timing in the pulmonary arterial tree. Very mild respiratory motion artifact in the posterior lower lobes. No focal filling defect identified in the pulmonary arteries to suggest acute pulmonary  embolism. Chronic calcified aortic and coronary artery atherosclerosis. Aberrant origin of the right subclavian  artery, normal variant. Mild cardiomegaly. No pericardial effusion. Mediastinum/Nodes: Negative.  No lymphadenopathy. Lungs/Pleura: Stable biapical lung scarring. A 7 mm posterior right upper lobe lung subpleural nodule is mildly spiculated but appears stable since 2015. Calcified granuloma along the right minor fissure. Major airways are patent. Trace layering right pleural effusion is new. No left pleural effusion. No other new pulmonary finding. Upper Abdomen: Stable occasional small calcified granulomas in the liver. Negative visible gallbladder, spleen, pancreas, adrenal glands, kidneys and stomach. Diverticulosis at the splenic flexure. Musculoskeletal: No acute osseous abnormality identified. Status post left mastectomy since the prior chest CT. No left axillary lymphadenopathy. Review of the MIP images confirms the above findings. IMPRESSION: 1.  No evidence of acute pulmonary embolus. 2. Trace layering right pleural effusion is new since the prior chest CT. But there is no associated acute Pulmonary opacity. Posterior right upper lobe lung nodule remains stable. 3. Calcified aorta and coronary artery atherosclerosis. Electronically Signed   By: Genevie Ann M.D.   On: 07/16/2016 08:06      Current Discharge Medication List    START taking these medications   Details  furosemide (LASIX) 20 MG tablet Take 1 tablet (20 mg total) by mouth daily as needed. for Shortness of breath Qty: 30 tablet, Refills: 0    isosorbide mononitrate (IMDUR) 30 MG 24 hr tablet Take 1 tablet (30 mg total) by mouth daily. Qty: 30 tablet, Refills: 0    nitroGLYCERIN (NITROSTAT) 0.4 MG SL tablet Place 1 tablet (0.4 mg total) under the tongue every 5 (five) minutes as needed for chest pain. Qty: 30 tablet, Refills: 0    simvastatin (ZOCOR) 40 MG tablet Take 1 tablet (40 mg total) by mouth daily. Qty: 30 tablet, Refills: 0      CONTINUE these medications which have NOT CHANGED   Details  ALPRAZolam (XANAX) 0.5 MG  tablet Take 1 tablet (0.5 mg total) by mouth every morning. 1/2 tablet twice daily as needed Qty: 30 tablet, Refills: 3    amLODipine-olmesartan (AZOR) 10-40 MG tablet Take 1 tablet by mouth as needed.     busPIRone (BUSPAR) 5 MG tablet 1 Tablet, Oral QHS Qty: 60 tablet, Refills: 2    metoprolol succinate (TOPROL-XL) 25 MG 24 hr tablet Take 1 tablet (25 mg total) by mouth daily. Qty: 30 tablet, Refills: 12    tamoxifen (NOLVADEX) 20 MG tablet Take 1 tablet (20 mg total) by mouth daily. Qty: 90 tablet, Refills: 3   Associated Diagnoses: Malignant neoplasm of upper-outer quadrant of left breast in female, estrogen receptor positive ([place]); Anemia, unspecified type    albuterol (PROVENTIL) (2.5 MG/3ML) 0.083% nebulizer solution Take 3 mLs (2.5 mg total) by nebulization every 6 (six) hours as needed for wheezing or shortness of breath. Qty: 75 mL, Refills: 12   Associated Diagnoses: Chronic obstructive pulmonary disease, unspecified COPD type (HCC)    aspirin 81 MG tablet Take 81 mg by mouth daily.     fluticasone (FLONASE) 50 MCG/ACT nasal spray Place 2 sprays into the nose as needed.     omeprazole (PRILOSEC) 20 MG capsule Take 20 mg by mouth as needed.       STOP taking these medications     ibuprofen (ADVIL,MOTRIN) 200 MG tablet      cholecalciferol (VITAMIN D) 1000 units tablet      loratadine (CLARITIN) 10 MG tablet      promethazine (PHENERGAN) 12.5  MG tablet      psyllium (METAMUCIL SMOOTH TEXTURE) 28 % packet         Management plans discussed with the patient and she is in agreement. Stable for discharge home  Patient should follow up with dr Rockey Situ Wednesday at 12;30 for cath  CODE STATUS:     Code Status Orders        Start     Ordered   07/16/16 1050  Full code  Continuous     07/16/16 1049    Code Status History    Date Active Date Inactive Code Status Order ID Comments User Context   This patient has a current code status but no historical code  status.      TOTAL TIME TAKING CARE OF THIS PATIENT: 38 minutes.    Note: This dictation was prepared with Dragon dictation along with smaller phrase technology. Any transcriptional errors that result from this process are unintentional.  Kirsta Probert M.D on 07/17/2016 at 10:44 AM  Between 7am to 6pm - Pager - [phone] After 6pm go to [email or web address] - password [facility]  Office  [phone]  CC: Primary care physician; Wilhemena Durie, MD

## 2016-07-21 ENCOUNTER — Telehealth: Payer: Self-pay | Admitting: Cardiovascular Disease

## 2016-07-21 ENCOUNTER — Other Ambulatory Visit: Payer: Self-pay | Admitting: Cardiovascular Disease

## 2016-07-21 ENCOUNTER — Inpatient Hospital Stay: Payer: PPO | Attending: Hematology and Oncology

## 2016-07-21 ENCOUNTER — Telehealth: Payer: Self-pay

## 2016-07-21 DIAGNOSIS — Z9012 Acquired absence of left breast and nipple: Secondary | ICD-10-CM | POA: Insufficient documentation

## 2016-07-21 DIAGNOSIS — K219 Gastro-esophageal reflux disease without esophagitis: Secondary | ICD-10-CM | POA: Insufficient documentation

## 2016-07-21 DIAGNOSIS — I214 Non-ST elevation (NSTEMI) myocardial infarction: Secondary | ICD-10-CM

## 2016-07-21 DIAGNOSIS — Z170 Estrogen receptor positive status [ER+]: Secondary | ICD-10-CM | POA: Insufficient documentation

## 2016-07-21 DIAGNOSIS — C50412 Malignant neoplasm of upper-outer quadrant of left female breast: Secondary | ICD-10-CM | POA: Diagnosis not present

## 2016-07-21 DIAGNOSIS — Z79810 Long term (current) use of selective estrogen receptor modulators (SERMs): Secondary | ICD-10-CM | POA: Diagnosis not present

## 2016-07-21 DIAGNOSIS — F17210 Nicotine dependence, cigarettes, uncomplicated: Secondary | ICD-10-CM | POA: Diagnosis not present

## 2016-07-21 DIAGNOSIS — E871 Hypo-osmolality and hyponatremia: Secondary | ICD-10-CM | POA: Diagnosis not present

## 2016-07-21 DIAGNOSIS — F419 Anxiety disorder, unspecified: Secondary | ICD-10-CM | POA: Insufficient documentation

## 2016-07-21 DIAGNOSIS — D649 Anemia, unspecified: Secondary | ICD-10-CM | POA: Insufficient documentation

## 2016-07-21 DIAGNOSIS — I10 Essential (primary) hypertension: Secondary | ICD-10-CM | POA: Diagnosis not present

## 2016-07-21 DIAGNOSIS — D508 Other iron deficiency anemias: Secondary | ICD-10-CM

## 2016-07-21 LAB — CBC WITH DIFFERENTIAL/PLATELET
Basophils Absolute: 0.1 10*3/uL (ref 0–0.1)
Basophils Relative: 1 %
Eosinophils Absolute: 0.4 10*3/uL (ref 0–0.7)
Eosinophils Relative: 6 %
HCT: 29.1 % — ABNORMAL LOW (ref 35.0–47.0)
Hemoglobin: 10.1 g/dL — ABNORMAL LOW (ref 12.0–16.0)
Lymphocytes Relative: 19 %
Lymphs Abs: 1.2 10*3/uL (ref 1.0–3.6)
MCH: 34.1 pg — ABNORMAL HIGH (ref 26.0–34.0)
MCHC: 34.9 g/dL (ref 32.0–36.0)
MCV: 97.7 fL (ref 80.0–100.0)
Monocytes Absolute: 1 10*3/uL — ABNORMAL HIGH (ref 0.2–0.9)
Monocytes Relative: 16 %
Neutro Abs: 3.7 10*3/uL (ref 1.4–6.5)
Neutrophils Relative %: 58 %
Platelets: 428 10*3/uL (ref 150–440)
RBC: 2.98 MIL/uL — ABNORMAL LOW (ref 3.80–5.20)
RDW: 13.2 % (ref 11.5–14.5)
WBC: 6.3 10*3/uL (ref 3.6–11.0)

## 2016-07-21 NOTE — Progress Notes (Signed)
  Oncology Nurse Navigator Documentation  Navigator Location: [facility] (07/21/16 1500)   )Navigator Encounter Type: Lobby (07/21/16 1500)                     Patient Visit Type: Follow-up (07/21/16 1500) Treatment Phase: Post-Tx Follow-up (07/21/16 1500)     Interventions: Other (07/21/16 1500)                      Time Spent with Patient: 15 (07/21/16 1500)   Notified Dr.Corcoran of patient's cardiac cath to be performed 07/22/16 per patient's request.

## 2016-07-21 NOTE — Telephone Encounter (Signed)
Initial appointment made for the CHF clinic on 07/31/2016 @ 9 am.

## 2016-07-21 NOTE — Telephone Encounter (Signed)
Transition Care Management Follow-up Telephone Call    Date discharged? 07/17/16  How have you been since you were released from the hospital? Recovering, denies SOB and chest pain. Pt confirmed that she is having some weakness.   Any patient concerns? None   Items Reviewed:  Medications reviewed: Yes  Allergies reviewed: Yes  Dietary changes reviewed: Yes- cardiac diet  Referrals reviewed: Yes- Dr. Rockey Situ tomorrow 07/22/16   Functional Questionnaire:  Independent - I Dependent - D    Activities of Daily Living (ADLs):    Personal hygiene - I Dressing - I Eating - I Maintaining continence - I Transferring - I   Independent Activities of Daily Living (iADLs): Basic communication skills - I Transportation - I Meal preparation - I Shopping - I Housework - I Managing medications - I  Managing personal finances - I   Confirmed importance and date/time of follow-up visits scheduled YES  Provider Appointment NOT booked with PCP. Pt is following up with Dr. Rockey Situ for a cardiac catheterization tomorrow. Pt declined appointment with Dr. Rosanna Randy. Next appointment scheduled for 10/13/16.  Confirmed with patient if condition begins to worsen call PCP or go to the ER.  Patient was given the office number and encouraged to call back with question or concerns: YES

## 2016-07-21 NOTE — Telephone Encounter (Signed)
Spoke w/ pt.  She is sched for cardiac cath tomorrow and verbalizes understanding to arrive @ the [facility] @ 10:30 for 11:30 procedure. She is planning on getting up early and having a piece of toast and coffee, but nothing else prior to procedure. Reviewed preprocedure instructions; she verbalizes understanding and is appreciative of the call.

## 2016-07-21 NOTE — Telephone Encounter (Signed)
-----   Message from Alisa Graff, [place] sent at 07/20/2016  4:04 PM EDT ----- Regarding: Please call Contact: [phone] Discharged 3/23

## 2016-07-22 ENCOUNTER — Ambulatory Visit
Admission: RE | Admit: 2016-07-22 | Discharge: 2016-07-22 | Disposition: A | Discharge status: Home / Self Care | Payer: PPO | Source: Ambulatory Visit | Attending: Cardiovascular Disease | Admitting: Cardiovascular Disease

## 2016-07-22 ENCOUNTER — Ambulatory Visit: Admit: 2016-07-22 | Payer: PPO | Admitting: Cardiovascular Disease

## 2016-07-22 ENCOUNTER — Encounter
Admission: RE | Disposition: A | Discharge status: Home / Self Care | Payer: Self-pay | Source: Ambulatory Visit | Attending: Cardiovascular Disease

## 2016-07-22 ENCOUNTER — Encounter: Payer: Self-pay | Admitting: *Deleted

## 2016-07-22 ENCOUNTER — Inpatient Hospital Stay: Payer: PPO | Admitting: Family Medicine

## 2016-07-22 DIAGNOSIS — I2584 Coronary atherosclerosis due to calcified coronary lesion: Secondary | ICD-10-CM | POA: Diagnosis not present

## 2016-07-22 DIAGNOSIS — I272 Pulmonary hypertension, unspecified: Secondary | ICD-10-CM

## 2016-07-22 DIAGNOSIS — F419 Anxiety disorder, unspecified: Secondary | ICD-10-CM | POA: Diagnosis not present

## 2016-07-22 DIAGNOSIS — I214 Non-ST elevation (NSTEMI) myocardial infarction: Secondary | ICD-10-CM

## 2016-07-22 DIAGNOSIS — R7989 Other specified abnormal findings of blood chemistry: Secondary | ICD-10-CM | POA: Insufficient documentation

## 2016-07-22 DIAGNOSIS — R0602 Shortness of breath: Secondary | ICD-10-CM | POA: Insufficient documentation

## 2016-07-22 DIAGNOSIS — I2510 Atherosclerotic heart disease of native coronary artery without angina pectoris: Secondary | ICD-10-CM | POA: Insufficient documentation

## 2016-07-22 DIAGNOSIS — R079 Chest pain, unspecified: Secondary | ICD-10-CM | POA: Insufficient documentation

## 2016-07-22 DIAGNOSIS — I252 Old myocardial infarction: Secondary | ICD-10-CM | POA: Diagnosis not present

## 2016-07-22 DIAGNOSIS — I25110 Atherosclerotic heart disease of native coronary artery with unstable angina pectoris: Secondary | ICD-10-CM | POA: Diagnosis present

## 2016-07-22 HISTORY — PX: RIGHT/LEFT HEART CATH AND CORONARY ANGIOGRAPHY: CATH118266

## 2016-07-22 SURGERY — RIGHT/LEFT HEART CATH AND CORONARY ANGIOGRAPHY
Anesthesia: Moderate Sedation

## 2016-07-22 SURGERY — RIGHT AND LEFT HEART CATH
Anesthesia: Moderate Sedation

## 2016-07-22 MED ORDER — SODIUM CHLORIDE 0.9% FLUSH: 3.0000 mL | INTRAVENOUS | Status: DC | PRN

## 2016-07-22 MED ORDER — SODIUM CHLORIDE 0.9 % IV SOLN: 250.0000 mL | INTRAVENOUS | Status: DC | PRN

## 2016-07-22 MED ORDER — HEPARIN (PORCINE) IN NACL 2-0.9 UNIT/ML-% IJ SOLN
INTRAMUSCULAR | Status: AC
Start: 2016-07-22 — End: 2016-07-22
  Filled 2016-07-22: qty 500

## 2016-07-22 MED ORDER — ASPIRIN 81 MG PO CHEW: 81.0000 mg | CHEWABLE_TABLET | ORAL | Status: DC

## 2016-07-22 MED ORDER — MIDAZOLAM HCL 2 MG/2ML IJ SOLN
INTRAMUSCULAR | Status: AC
  Filled 2016-07-22: qty 2

## 2016-07-22 MED ORDER — SODIUM CHLORIDE 0.9 % IV SOLN: INTRAVENOUS | Status: DC

## 2016-07-22 MED ORDER — LIDOCAINE HCL (PF) 1 % IJ SOLN
INTRAMUSCULAR | Status: DC | PRN
  Administered 2016-07-22: 15 mL

## 2016-07-22 MED ORDER — ACETAMINOPHEN 325 MG PO TABS: 650.0000 mg | ORAL_TABLET | ORAL | Status: DC | PRN

## 2016-07-22 MED ORDER — SODIUM CHLORIDE 0.9% FLUSH: 3.0000 mL | Freq: Two times a day (BID) | INTRAVENOUS | Status: DC

## 2016-07-22 MED ORDER — ASPIRIN 81 MG PO CHEW
CHEWABLE_TABLET | ORAL | Status: AC
  Filled 2016-07-22: qty 1

## 2016-07-22 MED ORDER — SODIUM CHLORIDE 0.9 % IV SOLN
INTRAVENOUS | Status: DC
  Administered 2016-07-22: 11:00:00 via INTRAVENOUS

## 2016-07-22 MED ORDER — MIDAZOLAM HCL 2 MG/2ML IJ SOLN
INTRAMUSCULAR | Status: DC | PRN
  Administered 2016-07-22: 0.5 mg via INTRAVENOUS
  Administered 2016-07-22: 1 mg via INTRAVENOUS

## 2016-07-22 MED ORDER — FENTANYL CITRATE (PF) 100 MCG/2ML IJ SOLN
INTRAMUSCULAR | Status: DC | PRN
  Administered 2016-07-22: 25 ug via INTRAVENOUS
  Administered 2016-07-22: 12.5 ug via INTRAVENOUS

## 2016-07-22 MED ORDER — ONDANSETRON HCL 4 MG/2ML IJ SOLN: 4.0000 mg | Freq: Four times a day (QID) | INTRAMUSCULAR | Status: DC | PRN

## 2016-07-22 MED ORDER — FENTANYL CITRATE (PF) 100 MCG/2ML IJ SOLN
INTRAMUSCULAR | Status: AC
  Filled 2016-07-22: qty 2

## 2016-07-22 MED ORDER — ASPIRIN 81 MG PO CHEW
81.0000 mg | CHEWABLE_TABLET | Freq: Once | ORAL | Status: AC
  Administered 2016-07-22: 81 mg via ORAL

## 2016-07-22 SURGICAL SUPPLY — 14 items
CATH 5FR PIGTAIL DIAGNOSTIC (CATHETERS) ×1 IMPLANT
CATH INFINITI 5FR JL4 (CATHETERS) ×1 IMPLANT
CATH INFINITI JR4 5F (CATHETERS) ×1 IMPLANT
CATH SWANZ 7F THERMO (CATHETERS) ×1 IMPLANT
DEVICE CLOSURE MYNXGRIP 5F (Vascular Products) ×1 IMPLANT
GUIDEWIRE EMER 3M J .025X150CM (WIRE) ×1 IMPLANT
KIT MANI 3VAL PERCEP (MISCELLANEOUS) ×2 IMPLANT
KIT RIGHT HEART (MISCELLANEOUS) ×1 IMPLANT
NDL PERC 18GX7CM (NEEDLE) IMPLANT
NEEDLE PERC 18GX7CM (NEEDLE) ×2 IMPLANT
PACK CARDIAC CATH (CUSTOM PROCEDURE TRAY) ×2 IMPLANT
SHEATH AVANTI 5FR X 11CM (SHEATH) ×1 IMPLANT
SHEATH PINNACLE 7F 10CM (SHEATH) ×1 IMPLANT
WIRE EMERALD 3MM-J .035X150CM (WIRE) ×1 IMPLANT

## 2016-07-22 NOTE — Progress Notes (Signed)
Husband stating wife did not take baby aspirin this am,thus will give,

## 2016-07-23 ENCOUNTER — Encounter: Payer: Self-pay | Admitting: Cardiovascular Disease

## 2016-07-23 ENCOUNTER — Other Ambulatory Visit: Payer: Self-pay

## 2016-07-23 LAB — METHYLMALONIC ACID, SERUM: Methylmalonic Acid, Quantitative: 322 nmol/L (ref 0–378)

## 2016-07-23 MED ORDER — ALPRAZOLAM 0.5 MG PO TABS: 0.5000 mg | ORAL_TABLET | ORAL | 2 refills | Status: DC

## 2016-07-28 ENCOUNTER — Telehealth: Payer: Self-pay

## 2016-07-28 NOTE — Telephone Encounter (Signed)
[name] called the office to cancel [name]'s appointment for 07/31/2016. He states that they are going to be seeing Dr. Rockey Situ on 08/03/2016 and would like to speak to him about the clinic before coming in.

## 2016-07-31 ENCOUNTER — Ambulatory Visit: Payer: PPO | Admitting: Family

## 2016-08-01 NOTE — Progress Notes (Signed)
Cardiology Office Note  Date:  08/03/2016   ID:  Bianca Shaw, DOB 1945/06/30, MRN [id number]  PCP:  Wilhemena Durie, MD   Chief Complaint  Patient presents with  . other    Follow up from [facility] ER with chest pain and shortness of breath. "doing well."     HPI:  [name] is a 71 year old woman with history of  smoking,1 ppd,  40 years, COPD breast cancer on the left with mastectomy,  hyponatremia, low potassium hypertension  who presents by referral to the hospital by primary care after blood work showing troponin 1.47 in the setting of left-sided chest pain on 07/15/2016.  outpt cardiac cath , no CAD, demand ischemia secondary to N/V, calcified LAD  Recent hospital admission Records reviewed in detail with her today including echocardiogram and cardiac catheterization results, CT scan results  reported having dry heaves, nausea for 3-4 days prior to admission  left side chest pain, SOB significant diarrhea, potassium 2.9, sodium 123  CT scan chest in the hospital significant coronary calcifications particularly in the LAD, and aorta  07/16/16 Left ventricle: ejection fraction was in the range of 60% to 65%. Wall motion was normal Mitral valve: There was mild to moderate regurgitation. Left atrium: The atrium was mildly dilated. Pulmonary arteries: PA peak pressure: 53 mm Hg (S).  Cath 07/22/16 No hemodynamically significant stenoses Normal right heart pressures Diffuse calcified plaque, predominantly in the LAD Normal LV function ejection fraction greater than 55%  In follow-up today she reports that she feels well, denies any shortness of breath Feels tired during the daytime otherwise no complaints Husband who presents with her today feels that she is doing well  EKG reviewed personally by myself showing normal sinus rhythm with rate 57 bpm, No significant ST or T-wave changes  PMH:   has a past medical history of Anemia; Anxiety disorder; Breast cancer of  upper-outer quadrant of left female breast ([name]) (11/2015); Cancer ([place]) (12/03/2015); Cough; Family history of adverse reaction to anesthesia; GERD (gastroesophageal reflux disease); H/O: hysterectomy; Hypertension; Osteopenia; Osteoporosis; Pericarditis; Personal history of tobacco use, presenting hazards to health (10/31/2015); Scleroderma ([place]); UTI (lower urinary tract infection); and Wears dentures.  PSH:    Past Surgical History:  Procedure Laterality Date  . BREAST BIOPSY Right 2012   core - neg  . BREAST BIOPSY Left 12/03/2015   INVASIVE LOBULAR CARCINOMA.   Marland Kitchen CATARACT EXTRACTION W/ INTRAOCULAR LENS IMPLANT Right   . COLONOSCOPY WITH PROPOFOL N/A 11/08/2015   Procedure: COLONOSCOPY WITH PROPOFOL;  Surgeon: Lucilla Lame, MD;  Location: [place];  Service: Endoscopy;  Laterality: N/A;  . EVACUATION BREAST HEMATOMA Left 01/14/2016   Procedure: EVACUATION HEMATOMA BREAST;  Surgeon: Robert Bellow, MD;  Location: [facility];  Service: General;  Laterality: Left;  Marland Kitchen MASTECTOMY W/ SENTINEL NODE BIOPSY Left 12/26/2015   Procedure: MASTECTOMY WITH SENTINEL LYMPH NODE BIOPSY;  Surgeon: Robert Bellow, MD;  Location: [facility];  Service: General;  Laterality: Left;  . RIGHT/LEFT HEART CATH AND CORONARY ANGIOGRAPHY N/A 07/22/2016   Procedure: Right/Left Heart Cath and Coronary Angiography;  Surgeon: Minna Merritts, MD;  Location: [place] [facility];  Service: Cardiovascular;  Laterality: N/A;  . TUBAL LIGATION    . VESICOVAGINAL FISTULA CLOSURE W/ TAH      Current Outpatient Prescriptions  Medication Sig Dispense Refill  . albuterol (PROVENTIL) (2.5 MG/3ML) 0.083% nebulizer solution Take 3 mLs (2.5 mg total) by nebulization every 6 (six) hours as needed for  wheezing or shortness of breath. 75 mL 12  . alendronate (FOSAMAX) 70 MG tablet Take 70 mg by mouth once a week.     . ALPRAZolam (XANAX) 0.5 MG tablet Take 1 tablet (0.5 mg total) by mouth every morning. 1/2 tablet twice daily  as needed 30 tablet 2  . amLODipine-olmesartan (AZOR) 10-40 MG tablet Take 1 tablet by mouth as needed.     Marland Kitchen aspirin 81 MG tablet Take 81 mg by mouth daily.     . busPIRone (BUSPAR) 5 MG tablet 1 Tablet, Oral QHS 60 tablet 2  . fluticasone (FLONASE) 50 MCG/ACT nasal spray Place 2 sprays into the nose as needed.     . furosemide (LASIX) 20 MG tablet Take 1 tablet (20 mg total) by mouth daily as needed. for Shortness of breath (Patient taking differently: Take 20 mg by mouth every other day. for Shortness of breath) 30 tablet 0  . isosorbide mononitrate (IMDUR) 30 MG 24 hr tablet Take 1 tablet (30 mg total) by mouth daily. 30 tablet 0  . metoprolol succinate (TOPROL-XL) 25 MG 24 hr tablet Take 1 tablet (25 mg total) by mouth daily. (Patient taking differently: Take 25 mg by mouth every morning. ) 30 tablet 12  . nitroGLYCERIN (NITROSTAT) 0.4 MG SL tablet Place 1 tablet (0.4 mg total) under the tongue every 5 (five) minutes as needed for chest pain. 30 tablet 0  . omeprazole (PRILOSEC) 20 MG capsule Take 20 mg by mouth as needed.     . simvastatin (ZOCOR) 40 MG tablet Take 1 tablet (40 mg total) by mouth daily. 30 tablet 0  . tamoxifen (NOLVADEX) 20 MG tablet Take 1 tablet (20 mg total) by mouth daily. 90 tablet 3   No current facility-administered medications for this visit.      Allergies:   Diuretic  [buchu-cornsilk-ch grass-hydran]; Furosemide; Other; and Pimenta   Social History:  The patient  reports that she quit smoking about 12 years ago. Her smoking use included Cigarettes. She has a 30.00 pack-year smoking history. She has never used smokeless tobacco. She reports that she drinks about 4.2 oz of alcohol per week . She reports that she does not use drugs.   Family History:   family history includes Anxiety disorder in her sister; Arthritis in her brother and sister; COPD in her brother, brother, and father; Epilepsy in her mother; Heart attack in her paternal grandfather; Heart disease  in her brother and father; Heart failure in her mother; Kidney failure in her brother; Vaginal cancer in her paternal grandmother.    Review of Systems: Review of Systems  Constitutional: Negative.   Respiratory: Negative.   Cardiovascular: Negative.   Gastrointestinal: Negative.   Musculoskeletal: Negative.   Neurological: Negative.   Psychiatric/Behavioral: Negative.   All other systems reviewed and are negative.    PHYSICAL EXAM: VS:  BP 130/78 (BP Location: Left Arm, Patient Position: Sitting, Cuff Size: Normal)   Pulse (!) 57   Ht 5' 1.5" (1.562 m)   Wt 145 lb (65.8 kg)   BMI 26.95 kg/m  , BMI Body mass index is 26.95 kg/m. GEN: Well nourished, well developed, in no acute distress  HEENT: normal  Neck: no JVD, carotid bruits, or masses Cardiac: RRR; no murmurs, rubs, or gallops,no edema  Respiratory:  clear to auscultation bilaterally, normal work of breathing GI: soft, nontender, nondistended, + BS MS: no deformity or atrophy  Skin: warm and dry, no rash Neuro:  Strength and sensation are intact  Psych: euthymic mood, full affect    Recent Labs: 07/15/2016: ALT 11 07/16/2016: B Natriuretic Peptide 835.0; Magnesium 1.8; TSH 2.968 07/17/2016: BUN 10; Creatinine, Ser 0.89; Potassium 3.7; Sodium 132 07/21/2016: Hemoglobin 10.1; Platelets 428    Lipid Panel Lab Results  Component Value Date   CHOL 119 07/16/2016   HDL 33 (L) 07/16/2016   LDLCALC 69 07/16/2016   TRIG 87 07/16/2016      Wt Readings from Last 3 Encounters:  08/03/16 145 lb (65.8 kg)  07/22/16 147 lb (66.7 kg)  07/16/16 147 lb 1.6 oz (66.7 kg)       ASSESSMENT AND PLAN:  Essential (primary) hypertension Blood pressure is well controlled on today's visit. No changes made to the medications.  Non-ST elevation (NSTEMI) myocardial infarction [facility]) Recommended that she stay on aspirin and Statin therapy Recent cardiac catheterization, no obstructive lesions, significant coronary calcification  in the LAD particularly  Hypercholesteremia Recommended that she stay on Zocor, goal LDL less than 70 given heavy calcification LAD  Anxiety Doing well, recommended she start regular walking program  Hyponatremia Etiology unclear, suggested repeat basic metabolic panel when she sees oncology in one month  Hypokalemia She is on Lasix 3 days per week, recommended she take potassium when she takes Lasix Needs BMP in one month. If not able to be done through oncology, we will do an our office  Smoker We have encouraged her to continue to work on weaning her cigarettes and smoking cessation. She will continue to work on this and does not want any assistance with chantix.   Shortness of breath Shortness of breath improved Moderately elevated right heart pressures on echocardiogram on in the hospital We'll continue Lasix every other day   Total encounter time more than 25 minutes  Greater than 50% was spent in counseling and coordination of care with the patient   Disposition:   F/U  6 months  No orders of the defined types were placed in this encounter.    Signed, Esmond Plants, M.D., Ph.D. 08/03/2016  [place], [place]

## 2016-08-03 ENCOUNTER — Encounter: Payer: Self-pay | Admitting: Cardiovascular Disease

## 2016-08-03 ENCOUNTER — Ambulatory Visit (INDEPENDENT_AMBULATORY_CARE_PROVIDER_SITE_OTHER): Payer: PPO | Admitting: Cardiovascular Disease

## 2016-08-03 VITALS — BP 130/78 | HR 57 | Ht 61.5 in | Wt 145.0 lb

## 2016-08-03 DIAGNOSIS — E876 Hypokalemia: Secondary | ICD-10-CM

## 2016-08-03 DIAGNOSIS — E871 Hypo-osmolality and hyponatremia: Secondary | ICD-10-CM | POA: Diagnosis not present

## 2016-08-03 DIAGNOSIS — E7800 Pure hypercholesterolemia, unspecified: Secondary | ICD-10-CM | POA: Diagnosis not present

## 2016-08-03 DIAGNOSIS — I214 Non-ST elevation (NSTEMI) myocardial infarction: Secondary | ICD-10-CM

## 2016-08-03 DIAGNOSIS — F17200 Nicotine dependence, unspecified, uncomplicated: Secondary | ICD-10-CM

## 2016-08-03 DIAGNOSIS — R0602 Shortness of breath: Secondary | ICD-10-CM | POA: Diagnosis not present

## 2016-08-03 DIAGNOSIS — F419 Anxiety disorder, unspecified: Secondary | ICD-10-CM

## 2016-08-03 DIAGNOSIS — I10 Essential (primary) hypertension: Secondary | ICD-10-CM | POA: Diagnosis not present

## 2016-08-03 MED ORDER — POTASSIUM CHLORIDE CRYS ER 20 MEQ PO TBCR: 20.0000 meq | EXTENDED_RELEASE_TABLET | Freq: Every day | ORAL | 3 refills | Status: DC | PRN

## 2016-08-03 NOTE — Patient Instructions (Addendum)
Medication Instructions:   For every lasix/furosemide, take one potassium pill For shortness of breath, take extra lasix  Take the metoprolol at dinner/evening  Labwork:  No new labs needed  Testing/Procedures:  No further testing at this time   I recommend watching educational videos on topics of interest to you at:       [email or web address]  Enter code: [email or web address]    Follow-Up: It was a pleasure seeing you in the office today. Please call us if you have new issues that need to be addressed before your next appt.  [phone]  Your physician wants you to follow-up in: 6 months.  You will receive a reminder letter in the mail two months in advance. If you don't receive a letter, please call our office to schedule the follow-up appointment.  If you need a refill on your cardiac medications before your next appointment, please call your pharmacy.

## 2016-09-07 ENCOUNTER — Encounter: Payer: Self-pay | Admitting: Hematology and Oncology

## 2016-09-07 ENCOUNTER — Other Ambulatory Visit: Payer: Self-pay | Admitting: *Deleted

## 2016-09-07 ENCOUNTER — Inpatient Hospital Stay: Payer: PPO | Attending: Hematology and Oncology

## 2016-09-07 ENCOUNTER — Inpatient Hospital Stay (HOSPITAL_BASED_OUTPATIENT_CLINIC_OR_DEPARTMENT_OTHER): Payer: PPO | Admitting: Hematology and Oncology

## 2016-09-07 VITALS — BP 179/75 | HR 59 | Temp 97.4°F | Resp 20 | Ht 61.0 in | Wt 144.4 lb

## 2016-09-07 DIAGNOSIS — M810 Age-related osteoporosis without current pathological fracture: Secondary | ICD-10-CM | POA: Insufficient documentation

## 2016-09-07 DIAGNOSIS — F419 Anxiety disorder, unspecified: Secondary | ICD-10-CM | POA: Diagnosis not present

## 2016-09-07 DIAGNOSIS — C50412 Malignant neoplasm of upper-outer quadrant of left female breast: Secondary | ICD-10-CM | POA: Diagnosis not present

## 2016-09-07 DIAGNOSIS — Z170 Estrogen receptor positive status [ER+]: Secondary | ICD-10-CM | POA: Diagnosis not present

## 2016-09-07 DIAGNOSIS — Z79810 Long term (current) use of selective estrogen receptor modulators (SERMs): Secondary | ICD-10-CM | POA: Diagnosis not present

## 2016-09-07 DIAGNOSIS — D649 Anemia, unspecified: Secondary | ICD-10-CM | POA: Insufficient documentation

## 2016-09-07 DIAGNOSIS — E871 Hypo-osmolality and hyponatremia: Secondary | ICD-10-CM | POA: Diagnosis not present

## 2016-09-07 DIAGNOSIS — Z7982 Long term (current) use of aspirin: Secondary | ICD-10-CM | POA: Insufficient documentation

## 2016-09-07 DIAGNOSIS — Z90710 Acquired absence of both cervix and uterus: Secondary | ICD-10-CM | POA: Diagnosis not present

## 2016-09-07 DIAGNOSIS — Z87891 Personal history of nicotine dependence: Secondary | ICD-10-CM | POA: Insufficient documentation

## 2016-09-07 DIAGNOSIS — Z9012 Acquired absence of left breast and nipple: Secondary | ICD-10-CM

## 2016-09-07 DIAGNOSIS — I10 Essential (primary) hypertension: Secondary | ICD-10-CM | POA: Diagnosis not present

## 2016-09-07 DIAGNOSIS — K219 Gastro-esophageal reflux disease without esophagitis: Secondary | ICD-10-CM | POA: Insufficient documentation

## 2016-09-07 DIAGNOSIS — Z853 Personal history of malignant neoplasm of breast: Secondary | ICD-10-CM

## 2016-09-07 LAB — COMPREHENSIVE METABOLIC PANEL
ALT: 12 U/L — ABNORMAL LOW (ref 14–54)
AST: 21 U/L (ref 15–41)
Albumin: 3.8 g/dL (ref 3.5–5.0)
Alkaline Phosphatase: 40 U/L (ref 38–126)
Anion gap: 6 (ref 5–15)
BUN: 23 mg/dL — ABNORMAL HIGH (ref 6–20)
CO2: 26 mmol/L (ref 22–32)
Calcium: 8.9 mg/dL (ref 8.9–10.3)
Chloride: 96 mmol/L — ABNORMAL LOW (ref 101–111)
Creatinine, Ser: 1.18 mg/dL — ABNORMAL HIGH (ref 0.44–1.00)
GFR calc Af Amer: 52 mL/min — ABNORMAL LOW (ref >60)
GFR calc non Af Amer: 45 mL/min — ABNORMAL LOW (ref >60)
Glucose, Bld: 94 mg/dL (ref 65–99)
Potassium: 4.5 mmol/L (ref 3.5–5.1)
Sodium: 128 mmol/L — ABNORMAL LOW (ref 135–145)
Total Bilirubin: 0.4 mg/dL (ref 0.3–1.2)
Total Protein: 7.7 g/dL (ref 6.5–8.1)

## 2016-09-07 LAB — CBC WITH DIFFERENTIAL/PLATELET
Basophils Absolute: 0.1 10*3/uL (ref 0–0.1)
Basophils Relative: 1 %
Eosinophils Absolute: 0.2 10*3/uL (ref 0–0.7)
Eosinophils Relative: 4 %
HCT: 28.7 % — ABNORMAL LOW (ref 35.0–47.0)
Hemoglobin: 10.1 g/dL — ABNORMAL LOW (ref 12.0–16.0)
Lymphocytes Relative: 20 %
Lymphs Abs: 1 10*3/uL (ref 1.0–3.6)
MCH: 34.1 pg — ABNORMAL HIGH (ref 26.0–34.0)
MCHC: 35.1 g/dL (ref 32.0–36.0)
MCV: 97.1 fL (ref 80.0–100.0)
Monocytes Absolute: 0.7 10*3/uL (ref 0.2–0.9)
Monocytes Relative: 14 %
Neutro Abs: 3.2 10*3/uL (ref 1.4–6.5)
Neutrophils Relative %: 61 %
Platelets: 251 10*3/uL (ref 150–440)
RBC: 2.95 MIL/uL — ABNORMAL LOW (ref 3.80–5.20)
RDW: 14 % (ref 11.5–14.5)
WBC: 5.3 10*3/uL (ref 3.6–11.0)

## 2016-09-07 LAB — RETICULOCYTES
RBC.: 3.05 MIL/uL — ABNORMAL LOW (ref 3.80–5.20)
Retic Count, Absolute: 36.6 10*3/uL (ref 19.0–183.0)
Retic Ct Pct: 1.2 % (ref 0.4–3.1)

## 2016-09-07 NOTE — Progress Notes (Signed)
Patient here for 3 month follow up. No changes since last appointment.

## 2016-09-07 NOTE — Progress Notes (Signed)
[facility] day:  09/07/2016   Chief Complaint: Bianca Shaw is a 71 y.o. female with stage IIA left breast cancer who is seen for 3 month assessment on tamoxifen.  HPI:  The patient was last seen in the medical oncology clinic on 06/09/2016.  At that time, she was doing well on tamoxifen.  She had a normocytic anemia.  Hematocrit was 29.3, hemoglobin 10.3, and MCV 97.4.  Retic was 1%.  Ferritin was 64.  Iron studies were normal.  B12 was 295 (low normal).  Folate was normal.  TSH was normal.  MMA was 322 on 07/21/2016, this r/o B12 deficiency.  Symptomatically, she denies any complaint.  She is eating very good.  She is eating iron rich food.  She denies any breast concerns.  She has a mammogram scheduled in 10/2016.   Past Medical History:  Diagnosis Date  . Anemia   . Anxiety disorder   . Breast cancer of upper-outer quadrant of left female breast ([place]) 11/2015   pT2 pN0(i+).;ER+; PR +, her 2 neu not overexpressed.  Mastectomy, SLN, Mammoprint: Low risk.   . Cancer ([place]) 12/03/2015   left breast/ INVASIVE LOBULAR CARCINOMA.   . Cough    lingering, mild, finished Prednisone and anitbiotic 11/03/15  . Family history of adverse reaction to anesthesia    sister - PONV  . GERD (gastroesophageal reflux disease)   . H/O: hysterectomy   . Hypertension   . Osteopenia   . Osteoporosis   . Pericarditis    diagnonsed June, 2010, unclear etiology as of yer  . Personal history of tobacco use, presenting hazards to health 10/31/2015  . Scleroderma ([facility])    ONLY ON SKIN-MILD  . UTI (lower urinary tract infection)   . Wears dentures    full upper    Past Surgical History:  Procedure Laterality Date  . BREAST BIOPSY Right 2012   core - neg  . BREAST BIOPSY Left 12/03/2015   INVASIVE LOBULAR CARCINOMA.   Marland Kitchen CATARACT EXTRACTION W/ INTRAOCULAR LENS IMPLANT Right   . COLONOSCOPY WITH PROPOFOL N/A 11/08/2015   Procedure: COLONOSCOPY WITH PROPOFOL;   Surgeon: Lucilla Lame, MD;  Location: [place];  Service: Endoscopy;  Laterality: N/A;  . EVACUATION BREAST HEMATOMA Left 01/14/2016   Procedure: EVACUATION HEMATOMA BREAST;  Surgeon: Robert Bellow, MD;  Location: [facility];  Service: General;  Laterality: Left;  Marland Kitchen MASTECTOMY W/ SENTINEL NODE BIOPSY Left 12/26/2015   Procedure: MASTECTOMY WITH SENTINEL LYMPH NODE BIOPSY;  Surgeon: Robert Bellow, MD;  Location: [facility];  Service: General;  Laterality: Left;  . RIGHT/LEFT HEART CATH AND CORONARY ANGIOGRAPHY N/A 07/22/2016   Procedure: Right/Left Heart Cath and Coronary Angiography;  Surgeon: Minna Merritts, MD;  Location: [place] [facility];  Service: Cardiovascular;  Laterality: N/A;  . TUBAL LIGATION    . VESICOVAGINAL FISTULA CLOSURE W/ TAH      Family History  Problem Relation Age of Onset  . Heart failure Mother   . Epilepsy Mother   . COPD Father   . Heart disease Father   . Anxiety disorder Sister   . Arthritis Brother   . Heart disease Brother   . Vaginal cancer Paternal Grandmother   . Heart attack Paternal Grandfather   . COPD Brother   . Kidney failure Brother   . COPD Brother   . Arthritis Sister   . Uterine cancer Unknown   . Diabetes Unknown   .  Colon cancer Neg Hx   . Stomach cancer Neg Hx   . Breast cancer Neg Hx     Social History:  reports that she quit smoking about 12 years ago. Her smoking use included Cigarettes. She has a 30.00 pack-year smoking history. She has never used smokeless tobacco. She reports that she drinks about 4.2 oz of alcohol per week . She reports that she does not use drugs.  She has 5 brothers and 2 sisters.  She has 2 children who are alive and well.  She lives in [place] with her husband, Pat Patrick.  The patient is accompanied by her husband today.  Allergies:  Allergies  Allergen Reactions  . Diuretic  [Buchu-Cornsilk-Ch Grass-Hydran]     hyponatremia  . Furosemide     hives  . Other     SSRI---hyponatremia  . Pimenta  Nausea And Vomiting    Current Medications: Current Outpatient Prescriptions  Medication Sig Dispense Refill  . albuterol (PROVENTIL) (2.5 MG/3ML) 0.083% nebulizer solution Take 3 mLs (2.5 mg total) by nebulization every 6 (six) hours as needed for wheezing or shortness of breath. 75 mL 12  . alendronate (FOSAMAX) 70 MG tablet Take 70 mg by mouth once a week.     . ALPRAZolam (XANAX) 0.5 MG tablet Take 1 tablet (0.5 mg total) by mouth every morning. 1/2 tablet twice daily as needed 30 tablet 2  . amLODipine-olmesartan (AZOR) 10-40 MG tablet Take 1 tablet by mouth as needed.     Marland Kitchen aspirin 81 MG tablet Take 81 mg by mouth daily.     . busPIRone (BUSPAR) 5 MG tablet 1 Tablet, Oral QHS 60 tablet 2  . fluticasone (FLONASE) 50 MCG/ACT nasal spray Place 2 sprays into the nose as needed.     . isosorbide mononitrate (IMDUR) 30 MG 24 hr tablet Take 1 tablet (30 mg total) by mouth daily. 30 tablet 0  . metoprolol succinate (TOPROL-XL) 25 MG 24 hr tablet Take 1 tablet (25 mg total) by mouth daily. (Patient taking differently: Take 25 mg by mouth every morning. ) 30 tablet 12  . nitroGLYCERIN (NITROSTAT) 0.4 MG SL tablet Place 1 tablet (0.4 mg total) under the tongue every 5 (five) minutes as needed for chest pain. 30 tablet 0  . omeprazole (PRILOSEC) 20 MG capsule Take 20 mg by mouth as needed.     . potassium chloride SA (K-DUR,KLOR-CON) 20 MEQ tablet Take 1 tablet (20 mEq total) by mouth daily as needed. 90 tablet 3  . tamoxifen (NOLVADEX) 20 MG tablet Take 1 tablet (20 mg total) by mouth daily. 90 tablet 3  . furosemide (LASIX) 20 MG tablet Take 1 tablet (20 mg total) by mouth daily as needed. for Shortness of breath (Patient not taking: Reported on 09/07/2016) 30 tablet 0  . simvastatin (ZOCOR) 40 MG tablet Take 1 tablet (40 mg total) by mouth daily. (Patient not taking: Reported on 09/07/2016) 30 tablet 0   No current facility-administered medications for this visit.     Review of Systems:   GENERAL:  Feels good.  No fevers or sweats.  Weight stable. PERFORMANCE STATUS (ECOG): 0 HEENT:  No visual changes, runny nose, sore throat, mouth sores or tenderness. Lungs: No shortness of breath or cough.  No hemoptysis. Cardiac:  No chest pain, palpitations, orthopnea, or PND. GI:  Eating well.  No nausea, vomiting, diarrhea, constipation, melena or hematochezia.  Colonoscopy done 09/2015. GU:  No urgency, frequency, dysuria, or hematuria. Musculoskeletal:  Osteoporosis on Fosamax.  No back pain.  No joint pain.  No muscle tenderness. Extremities:  No pain or swelling. Skin:  Skin changes due to scleroderma.  Healing well s/p left mastectomy.  No rashes or skin changes. Neuro:  No headache, numbness or weakness, balance or coordination issues. Endocrine:  No diabetes, thyroid issues, hot flashes or night sweats. Psych:  No mood changes, depression or anxiety. Pain:  No focal pain. Review of systems:  All other systems reviewed and found to be negative.  Physical Exam: 137 Blood pressure (!) 183/76, pulse (!) 58, temperature 97.4 F (36.3 C), temperature source Tympanic, resp. rate 20, height '5\' 1"'  (1.549 m), weight 144 lb 6.4 oz (65.5 kg). GENERAL:  Well developed, well nourished, woman sitting comfortably in the exam room in no acute distress. MENTAL STATUS:  Alert and oriented to person, place and time. HEAD:  Short gray hair.  Normocephalic, atraumatic, face symmetric, no Cushingoid features. EYES:  Blue eyes.  Pupils equal round and reactive to light and accomodation.  No conjunctivitis or scleral icterus. ENT:  Oropharynx clear without lesion.  Tongue normal. Mucous membranes moist.  RESPIRATORY:  Clear to auscultation without rales, wheezes or rhonchi. CARDIOVASCULAR:  Regular rate and rhythm without murmur, rub or gallop. BREAST:  Right breast without masses, skin changes or nipple discharge.  Left sided mastectomy well healed. ABDOMEN:  Soft, non-tender, with active bowel  sounds, and no hepatosplenomegaly.  No masses. SKIN:  No rashes, ulcers or lesions. EXTREMITIES: No edema, no skin discoloration or tenderness.  No palpable cords. LYMPH NODES: No palpable cervical, supraclavicular, axillary or inguinal adenopathy  NEUROLOGICAL: Unremarkable. PSYCH:  Appropriate.   Appointment on 09/07/2016  Component Date Value Ref Range Status  . WBC 09/07/2016 5.3  3.6 - 11.0 K/uL Final  . RBC 09/07/2016 2.95* 3.80 - 5.20 MIL/uL Final  . Hemoglobin 09/07/2016 10.1* 12.0 - 16.0 g/dL Final  . HCT 09/07/2016 28.7* 35.0 - 47.0 % Final  . MCV 09/07/2016 97.1  80.0 - 100.0 fL Final  . MCH 09/07/2016 34.1* 26.0 - 34.0 pg Final  . MCHC 09/07/2016 35.1  32.0 - 36.0 g/dL Final  . RDW 09/07/2016 14.0  11.5 - 14.5 % Final  . Platelets 09/07/2016 251  150 - 440 K/uL Final  . Neutrophils Relative % 09/07/2016 61  % Final  . Neutro Abs 09/07/2016 3.2  1.4 - 6.5 K/uL Final  . Lymphocytes Relative 09/07/2016 20  % Final  . Lymphs Abs 09/07/2016 1.0  1.0 - 3.6 K/uL Final  . Monocytes Relative 09/07/2016 14  % Final  . Monocytes Absolute 09/07/2016 0.7  0.2 - 0.9 K/uL Final  . Eosinophils Relative 09/07/2016 4  % Final  . Eosinophils Absolute 09/07/2016 0.2  0 - 0.7 K/uL Final  . Basophils Relative 09/07/2016 1  % Final  . Basophils Absolute 09/07/2016 0.1  0 - 0.1 K/uL Final  . Sodium 09/07/2016 128* 135 - 145 mmol/L Final  . Potassium 09/07/2016 4.5  3.5 - 5.1 mmol/L Final  . Chloride 09/07/2016 96* 101 - 111 mmol/L Final  . CO2 09/07/2016 26  22 - 32 mmol/L Final  . Glucose, Bld 09/07/2016 94  65 - 99 mg/dL Final  . BUN 09/07/2016 23* 6 - 20 mg/dL Final  . Creatinine, Ser 09/07/2016 1.18* 0.44 - 1.00 mg/dL Final  . Calcium 09/07/2016 8.9  8.9 - 10.3 mg/dL Final  . Total Protein 09/07/2016 7.7  6.5 - 8.1 g/dL Final  . Albumin 09/07/2016 3.8  3.5 - 5.0  g/dL Final  . AST 09/07/2016 21  15 - 41 U/L Final  . ALT 09/07/2016 12* 14 - 54 U/L Final  . Alkaline Phosphatase  09/07/2016 40  38 - 126 U/L Final  . Total Bilirubin 09/07/2016 0.4  0.3 - 1.2 mg/dL Final  . GFR calc non Af Amer 09/07/2016 45* >60 mL/min Final  . GFR calc Af Amer 09/07/2016 52* >60 mL/min Final   Comment: (NOTE) The eGFR has been calculated using the CKD EPI equation. This calculation has not been validated in all clinical situations. eGFR's persistently <60 mL/min signify possible Chronic Kidney Disease.   . Anion gap 09/07/2016 6  5 - 15 Final    Assessment:  Bianca Shaw is a 71 y.o. female with stage IIA (T2N0) left breast cancer s/p mastectomy with sentinel lymph node biopsy on 12/26/2015.   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  Margins were negative.  Two sentinel lymph nodes were negative for macrometastasis, but with isolated tumor cells on IHC stains.  Three additional lymph nodes were positive for isolated tumor cells on IHC.  Tumor was ER positive (> 90%), PR positive (> 90%), and Her2/neu 2+ (eqivocal).  Her2/neu by FISH was negative.  Pathologic stage was pT2 pN0(i+).  MammaPrint testing revealed low risk luminal type A. There was a 97.8% probability of being disease free at 10 years with hormonal therapy.   Left mammogram and ultrasound on 11/21/2015 revealed a 4.2 x 2.4 x 2.6 cm irregular hypoechoic mass in the 1-2 o'clock position in the left breast 3 cm from the nipple.  Left axillary lymph nodes appeared normal.  Breast MRI on 12/25/2015 revealed a 6.2 cm area of abnormal enhancement in the upper outer quadrant of the left breast.  The right breast revealed no mass or abnormal enhancement.  She began tamoxifen on 02/04/2016.  She is tolerating it well.  Bone density on 11/05/2015 revealed osteoporosis with a T score of -2.9 in the AP spine L1-L2 and -1.9 in the left femoral neck.  She started Fosamax in 10/2015.  She is on calcium and vitamin D.  She has a normocytic anemia.  Hematocrit has ranged between  32-24 since 11/2015.  Diet is good.  She had a colonoscopy in 09/2015.  She denies any melena, hematochezia, hematuria or vaginal bleeding.  Anemia work-up on 06/09/2016 revealed the following normal studies: ferritin (64), iron studies, folate, TSH.  B12 was 295 (low normal) with a normal MMA thus r/o B12 deficiency.  Retic was 1% (inappropritely low).     She has scleroderma.  She has skin thickening at sites of abrasion.  She has a history of chronic hyponatremia.  She is on fluid restriction.  Symptomatically, she denies any complaints.  Exam is stable.  Hematocrit is 28.7 with a hemoglobin of 10.1.  Plan: 1.  Labs today:  CBC with diff, CMP, CA27.29, retic. 2.  Discuss normocytic anemia and work-up to date. Etiology unclear.  If work-up unrevealing and counts continue to drift down, discuss bone marrow. She may have an underlying myelodysplastic syndrome. 3.  Continue tamoxifen. 4.  Continue Fosamax, calcium and vitamin D. 5.  RTC in 6 weeks for labs (CBC with diff, ferritin, DAT). 6.  RTC in 3 months for MD assessment and labs (CBC with diff, CMP, CA27.29).   Lequita Asal, MD  09/07/2016, 11:08 AM

## 2016-09-08 LAB — CANCER ANTIGEN 27.29: CA 27.29: 16.4 U/mL (ref 0.0–38.6)

## 2016-09-15 DIAGNOSIS — N183 Chronic kidney disease, stage 3 (moderate): Secondary | ICD-10-CM | POA: Diagnosis not present

## 2016-09-15 DIAGNOSIS — R809 Proteinuria, unspecified: Secondary | ICD-10-CM | POA: Diagnosis not present

## 2016-09-15 DIAGNOSIS — I10 Essential (primary) hypertension: Secondary | ICD-10-CM | POA: Diagnosis not present

## 2016-09-15 DIAGNOSIS — E871 Hypo-osmolality and hyponatremia: Secondary | ICD-10-CM | POA: Diagnosis not present

## 2016-10-07 ENCOUNTER — Other Ambulatory Visit: Payer: Self-pay

## 2016-10-07 DIAGNOSIS — Z1231 Encounter for screening mammogram for malignant neoplasm of breast: Secondary | ICD-10-CM

## 2016-10-07 DIAGNOSIS — C50412 Malignant neoplasm of upper-outer quadrant of left female breast: Secondary | ICD-10-CM

## 2016-10-07 DIAGNOSIS — Z170 Estrogen receptor positive status [ER+]: Principal | ICD-10-CM

## 2016-10-08 ENCOUNTER — Ambulatory Visit (INDEPENDENT_AMBULATORY_CARE_PROVIDER_SITE_OTHER): Payer: PPO

## 2016-10-08 VITALS — BP 144/78 | HR 60 | Temp 98.4°F | Ht 61.0 in | Wt 148.4 lb

## 2016-10-08 DIAGNOSIS — Z0000 Encounter for general adult medical examination without abnormal findings: Secondary | ICD-10-CM

## 2016-10-08 NOTE — Progress Notes (Signed)
Subjective:   Bianca Shaw is a 71 y.o. female who presents for Medicare Annual (Subsequent) preventive examination.  Review of Systems:  N/A  Cardiac Risk Factors include: advanced age (>81men, >36 women);dyslipidemia;hypertension;sedentary lifestyle     Objective:     Vitals: BP (!) 144/78 (BP Location: Right Arm)   Pulse 60   Temp 98.4 F (36.9 C) (Oral)   Ht 5\' 1"  (1.549 m)   Wt 148 lb 6.4 oz (67.3 kg)   BMI 28.04 kg/m   Body mass index is 28.04 kg/m.   Tobacco History  Smoking Status  . Former Smoker  . Packs/day: 0.75  . Years: 40.00  . Types: Cigarettes  . Quit date: 11/07/2003  Smokeless Tobacco  . Never Used     Counseling given: Not Answered   Past Medical History:  Diagnosis Date  . Anemia   . Anxiety disorder   . Breast cancer of upper-outer quadrant of left female breast ([place]) 11/2015   pT2 pN0(i+).;ER+; PR +, her 2 neu not overexpressed.  Mastectomy, SLN, Mammoprint: Low risk.   . Cancer ([place]) 12/03/2015   left breast/ INVASIVE LOBULAR CARCINOMA.   . Cough    lingering, mild, finished Prednisone and anitbiotic 11/03/15  . Family history of adverse reaction to anesthesia    sister - PONV  . GERD (gastroesophageal reflux disease)   . H/O: hysterectomy   . Hypertension   . Osteopenia   . Osteoporosis   . Pericarditis    diagnonsed June, 2010, unclear etiology as of yer  . Personal history of tobacco use, presenting hazards to health 10/31/2015  . Scleroderma ([facility])    ONLY ON SKIN-MILD  . UTI (lower urinary tract infection)   . Wears dentures    full upper   Past Surgical History:  Procedure Laterality Date  . BREAST BIOPSY Right 2012   core - neg  . BREAST BIOPSY Left 12/03/2015   INVASIVE LOBULAR CARCINOMA.   Marland Kitchen CATARACT EXTRACTION W/ INTRAOCULAR LENS IMPLANT Right   . COLONOSCOPY WITH PROPOFOL N/A 11/08/2015   Procedure: COLONOSCOPY WITH PROPOFOL;  Surgeon: Lucilla Lame, MD;  Location: [facility];  Service: Endoscopy;   Laterality: N/A;  . EVACUATION BREAST HEMATOMA Left 01/14/2016   Procedure: EVACUATION HEMATOMA BREAST;  Surgeon: Robert Bellow, MD;  Location: [facility];  Service: General;  Laterality: Left;  Marland Kitchen MASTECTOMY W/ SENTINEL NODE BIOPSY Left 12/26/2015   Procedure: MASTECTOMY WITH SENTINEL LYMPH NODE BIOPSY;  Surgeon: Robert Bellow, MD;  Location: [facility];  Service: General;  Laterality: Left;  . RIGHT/LEFT HEART CATH AND CORONARY ANGIOGRAPHY N/A 07/22/2016   Procedure: Right/Left Heart Cath and Coronary Angiography;  Surgeon: Minna Merritts, MD;  Location: [facility];  Service: Cardiovascular;  Laterality: N/A;  . TUBAL LIGATION    . VESICOVAGINAL FISTULA CLOSURE W/ TAH     Family History  Problem Relation Age of Onset  . Heart failure Mother   . Epilepsy Mother   . COPD Father   . Heart disease Father   . Anxiety disorder Sister   . Arthritis Brother   . Heart disease Brother   . Vaginal cancer Paternal Grandmother   . Heart attack Paternal Grandfather   . COPD Brother   . Kidney failure Brother   . COPD Brother   . Arthritis Sister   . Uterine cancer Unknown   . Diabetes Unknown   . Colon cancer Neg Hx   . Stomach cancer Neg Hx   .  Breast cancer Neg Hx    History  Sexual Activity  . Sexual activity: Not on file    Outpatient Encounter Prescriptions as of 10/08/2016  Medication Sig  . albuterol (PROVENTIL) (2.5 MG/3ML) 0.083% nebulizer solution Take 3 mLs (2.5 mg total) by nebulization every 6 (six) hours as needed for wheezing or shortness of breath.  Marland Kitchen alendronate (FOSAMAX) 70 MG tablet Take 70 mg by mouth once a week.   . ALPRAZolam (XANAX) 0.5 MG tablet Take 1 tablet (0.5 mg total) by mouth every morning. 1/2 tablet twice daily as needed  . amLODipine-olmesartan (AZOR) 10-40 MG tablet Take 1 tablet by mouth as needed.   Marland Kitchen aspirin 81 MG tablet Take 81 mg by mouth daily.   . busPIRone (BUSPAR) 5 MG tablet 1 Tablet, Oral QHS  . fluticasone (FLONASE) 50 MCG/ACT  nasal spray Place 2 sprays into the nose as needed.   . isosorbide mononitrate (IMDUR) 30 MG 24 hr tablet Take 1 tablet (30 mg total) by mouth daily.  . metoprolol succinate (TOPROL-XL) 25 MG 24 hr tablet Take 1 tablet (25 mg total) by mouth daily. (Patient taking differently: Take 25 mg by mouth every morning. )  . nitroGLYCERIN (NITROSTAT) 0.4 MG SL tablet Place 1 tablet (0.4 mg total) under the tongue every 5 (five) minutes as needed for chest pain.  Marland Kitchen omeprazole (PRILOSEC) 20 MG capsule Take 20 mg by mouth as needed.   . potassium chloride SA (K-DUR,KLOR-CON) 20 MEQ tablet Take 1 tablet (20 mEq total) by mouth daily as needed.  . simvastatin (ZOCOR) 40 MG tablet Take 1 tablet (40 mg total) by mouth daily.  . tamoxifen (NOLVADEX) 20 MG tablet Take 1 tablet (20 mg total) by mouth daily.  . [DISCONTINUED] furosemide (LASIX) 20 MG tablet Take 1 tablet (20 mg total) by mouth daily as needed. for Shortness of breath (Patient not taking: Reported on 09/07/2016)   No facility-administered encounter medications on file as of 10/08/2016.     Activities of Daily Living In your present state of health, do you have any difficulty performing the following activities: 10/08/2016 07/16/2016  Hearing? N -  Vision? Y -  Difficulty concentrating or making decisions? N -  Walking or climbing stairs? Y -  Dressing or bathing? N -  Doing errands, shopping? N N  Preparing Food and eating ? N -  Using the Toilet? N -  In the past six months, have you accidently leaked urine? N -  Do you have problems with loss of bowel control? N -  Managing your Medications? N -  Managing your Finances? N -  Housekeeping or managing your Housekeeping? 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 exercise          Starting 03/25/16, I will start walking 15 minutes twice a week.    Marland Kitchen  Exercise 3x per week (30 min per time)          Recommend increasing exercise to 30 minutes for 3 days a week. Pt to start walking.       Fall Risk Fall Risk  10/08/2016 03/25/2016 10/08/2015  Falls in the past year? No No No   Depression Screen PHQ 2/9 Scores 10/08/2016 10/08/2016 03/25/2016 10/08/2015  PHQ - 2 Score 0 0 0 0  PHQ- 9 Score 0 - - -     Cognitive Function     6CIT Screen 10/08/2016 03/25/2016  What Year? 0 points 0 points  What month? 0 points 0 points  What time? 0 points 0 points  Count back from 20 0 points 4 points  Months in reverse 4 points 4 points  Repeat phrase 6 points 4 points  Total Score 10 12    Immunization History  Administered Date(s) Administered  . Influenza, High Dose Seasonal PF 02/18/2015  . Influenza,inj,quad, With Preservative 03/05/2016  . Pneumococcal Conjugate-13 09/25/2014  . Pneumococcal Polysaccharide-23 04/03/2010  . Zoster 04/09/2011   Screening Tests Health Maintenance  Topic Date Due  . Hepatitis C Screening  10/12/2016 (Originally 1945/05/13)  . INFLUENZA VACCINE  11/25/2016  . MAMMOGRAM  11/04/2017  . TETANUS/TDAP  12/26/2017  . COLONOSCOPY  11/07/2025  . DEXA SCAN  Completed  . PNA vac Low Risk Adult  Completed      Plan:  I have personally reviewed and addressed the Medicare Annual Wellness questionnaire and  have noted the following in the patient's chart:  A. Medical and social history B. Use of alcohol, tobacco or illicit drugs  C. Current medications and supplements D. Functional ability and status E.  Nutritional status F.  Physical activity G. Advance directives H. List of other physicians I.  Hospitalizations, surgeries, and ER visits in previous 12 months J.  [place] such as hearing and vision if needed, cognitive and depression L. Referrals and appointments - none  In addition, I have reviewed and discussed with patient certain preventive protocols, quality metrics, and best practice recommendations. A written personalized care plan for preventive services as well as general preventive health recommendations were provided to patient.  See attached scanned questionnaire for additional information.   Signed,  Fabio Neighbors, LPN Nurse Health Advisor   MD Recommendations: Pt declined Hepatitis C screening test today. Pt to have this done at next apt on 10/13/16 with other needed BW.

## 2016-10-08 NOTE — Patient Instructions (Signed)
Bianca Shaw , Thank you for taking time to come for your Medicare Wellness Visit. I appreciate your ongoing commitment to your health goals. Please review the following plan we discussed and let me know if I can assist you in the future.   Screening recommendations/referrals: Colonoscopy: completed 11/08/15 Mammogram: completed 11/21/15, due 10/2016 Bone Density: completed 11/05/15 Recommended yearly ophthalmology/optometry visit for glaucoma screening and checkup Recommended yearly dental visit for hygiene and checkup  Vaccinations: Influenza vaccine: up to date, due 12/2016 Pneumococcal vaccine: completed series Tdap vaccine: completed 12/27/07, due 12/2017 Shingles vaccine: completed 04/09/11  Advanced directives: Already on file (LW)  Conditions/risks identified: Recommend increasing exercise to 30 minutes for 3 days a week.  Next appointment: 10/13/16 @ 10:00 AM   Preventive Care 72 Years and Older, Female Preventive care refers to lifestyle choices and visits with your health care provider that can promote health and wellness. What does preventive care include?  A yearly physical exam. This is also called an annual well check.  Dental exams once or twice a year.  Routine eye exams. Ask your health care provider how often you should have your eyes checked.  Personal lifestyle choices, including:  Daily care of your teeth and gums.  Regular physical activity.  Eating a healthy diet.  Avoiding tobacco and drug use.  Limiting alcohol use.  Practicing safe sex.  Taking low-dose aspirin every day.  Taking vitamin and mineral supplements as recommended by your health care provider. What happens during an annual well check? The services and screenings done by your health care provider during your annual well check will depend on your age, overall health, lifestyle risk factors, and family history of disease. Counseling  Your health care provider may ask you  questions about your:  Alcohol use.  Tobacco use.  Drug use.  Emotional well-being.  Home and relationship well-being.  Sexual activity.  Eating habits.  History of falls.  Memory and ability to understand (cognition).  Work and work Statistician.  Reproductive health. Screening  You may have the following tests or measurements:  Height, weight, and BMI.  Blood pressure.  Lipid and cholesterol levels. These may be checked every 5 years, or more frequently if you are over 54 years old.  Skin check.  Lung cancer screening. You may have this screening every year starting at age 61 if you have a 30-pack-year history of smoking and currently smoke or have quit within the past 15 years.  Fecal occult blood test (FOBT) of the stool. You may have this test every year starting at age 40.  Flexible sigmoidoscopy or colonoscopy. You may have a sigmoidoscopy every 5 years or a colonoscopy every 10 years starting at age 30.  Hepatitis C blood test.  Hepatitis B blood test.  Sexually transmitted disease (STD) testing.  Diabetes screening. This is done by checking your blood sugar (glucose) after you have not eaten for a while (fasting). You may have this done every 1-3 years.  Bone density scan. This is done to screen for osteoporosis. You may have this done starting at age 5.  Mammogram. This may be done every 1-2 years. Talk to your health care provider about how often you should have regular mammograms. Talk with your health care provider about your test results, treatment options, and if necessary, the need for more tests. Vaccines  Your health care provider may recommend certain vaccines, such as:  Influenza vaccine. This is recommended every year.  Tetanus, diphtheria, and acellular pertussis (Tdap,  Td) vaccine. You may need a Td booster every 10 years.  Zoster vaccine. You may need this after age 41.  Pneumococcal 13-valent conjugate (PCV13) vaccine. One dose is  recommended after age 96.  Pneumococcal polysaccharide (PPSV23) vaccine. One dose is recommended after age 6. Talk to your health care provider about which screenings and vaccines you need and how often you need them. This information is not intended to replace advice given to you by your health care provider. Make sure you discuss any questions you have with your health care provider. Document Released: 05/10/2015 Document Revised: 01/01/2016 Document Reviewed: 02/12/2015 Elsevier Interactive Patient Education  2017 [place] Prevention in the Home Falls can cause injuries. They can happen to people of all ages. There are many things you can do to make your home safe and to help prevent falls. What can I do on the outside of my home?  Regularly fix the edges of walkways and driveways and fix any cracks.  Remove anything that might make you trip as you walk through a door, such as a raised step or threshold.  Trim any bushes or trees on the path to your home.  Use bright outdoor lighting.  Clear any walking paths of anything that might make someone trip, such as rocks or tools.  Regularly check to see if handrails are loose or broken. Make sure that both sides of any steps have handrails.  Any raised decks and porches should have guardrails on the edges.  Have any leaves, snow, or ice cleared regularly.  Use sand or salt on walking paths during winter.  Clean up any spills in your garage right away. This includes oil or grease spills. What can I do in the bathroom?  Use night lights.  Install grab bars by the toilet and in the tub and shower. Do not use towel bars as grab bars.  Use non-skid mats or decals in the tub or shower.  If you need to sit down in the shower, use a plastic, non-slip stool.  Keep the floor dry. Clean up any water that spills on the floor as soon as it happens.  Remove soap buildup in the tub or shower regularly.  Attach bath mats  securely with double-sided non-slip rug tape.  Do not have throw rugs and other things on the floor that can make you trip. What can I do in the bedroom?  Use night lights.  Make sure that you have a light by your bed that is easy to reach.  Do not use any sheets or blankets that are too big for your bed. They should not hang down onto the floor.  Have a firm chair that has side arms. You can use this for support while you get dressed.  Do not have throw rugs and other things on the floor that can make you trip. What can I do in the kitchen?  Clean up any spills right away.  Avoid walking on wet floors.  Keep items that you use a lot in easy-to-reach places.  If you need to reach something above you, use a strong step stool that has a grab bar.  Keep electrical cords out of the way.  Do not use floor polish or wax that makes floors slippery. If you must use wax, use non-skid floor wax.  Do not have throw rugs and other things on the floor that can make you trip. What can I do with my stairs?  Do not  leave any items on the stairs.  Make sure that there are handrails on both sides of the stairs and use them. Fix handrails that are broken or loose. Make sure that handrails are as long as the stairways.  Check any carpeting to make sure that it is firmly attached to the stairs. Fix any carpet that is loose or worn.  Avoid having throw rugs at the top or bottom of the stairs. If you do have throw rugs, attach them to the floor with carpet tape.  Make sure that you have a light switch at the top of the stairs and the bottom of the stairs. If you do not have them, ask someone to add them for you. What else can I do to help prevent falls?  Wear shoes that:  Do not have high heels.  Have rubber bottoms.  Are comfortable and fit you well.  Are closed at the toe. Do not wear sandals.  If you use a stepladder:  Make sure that it is fully opened. Do not climb a closed  stepladder.  Make sure that both sides of the stepladder are locked into place.  Ask someone to hold it for you, if possible.  Clearly mark and make sure that you can see:  Any grab bars or handrails.  First and last steps.  Where the edge of each step is.  Use tools that help you move around (mobility aids) if they are needed. These include:  Canes.  Walkers.  Scooters.  Crutches.  Turn on the lights when you go into a dark area. Replace any light bulbs as soon as they burn out.  Set up your furniture so you have a clear path. Avoid moving your furniture around.  If any of your floors are uneven, fix them.  If there are any pets around you, be aware of where they are.  Review your medicines with your doctor. Some medicines can make you feel dizzy. This can increase your chance of falling. Ask your doctor what other things that you can do to help prevent falls. This information is not intended to replace advice given to you by your health care provider. Make sure you discuss any questions you have with your health care provider. Document Released: 02/07/2009 Document Revised: 09/19/2015 Document Reviewed: 05/18/2014 Elsevier Interactive Patient Education  2017 [employer].

## 2016-10-13 ENCOUNTER — Ambulatory Visit (INDEPENDENT_AMBULATORY_CARE_PROVIDER_SITE_OTHER): Payer: PPO | Admitting: Family Medicine

## 2016-10-13 ENCOUNTER — Encounter: Payer: Self-pay | Admitting: Family Medicine

## 2016-10-13 VITALS — BP 138/76 | HR 66 | Resp 12 | Wt 149.0 lb

## 2016-10-13 DIAGNOSIS — Z9012 Acquired absence of left breast and nipple: Secondary | ICD-10-CM | POA: Diagnosis not present

## 2016-10-13 DIAGNOSIS — Z0000 Encounter for general adult medical examination without abnormal findings: Secondary | ICD-10-CM | POA: Diagnosis not present

## 2016-10-13 DIAGNOSIS — I10 Essential (primary) hypertension: Secondary | ICD-10-CM | POA: Diagnosis not present

## 2016-10-13 DIAGNOSIS — M810 Age-related osteoporosis without current pathological fracture: Secondary | ICD-10-CM

## 2016-10-13 DIAGNOSIS — C50412 Malignant neoplasm of upper-outer quadrant of left female breast: Secondary | ICD-10-CM | POA: Diagnosis not present

## 2016-10-13 DIAGNOSIS — F419 Anxiety disorder, unspecified: Secondary | ICD-10-CM

## 2016-10-13 MED ORDER — ALPRAZOLAM 0.5 MG PO TABS: ORAL_TABLET | ORAL | 5 refills | Status: DC

## 2016-10-13 MED ORDER — AMLODIPINE-OLMESARTAN 10-40 MG PO TABS: 1.0000 | ORAL_TABLET | ORAL | 12 refills | Status: DC | PRN

## 2016-10-13 MED ORDER — BUSPIRONE HCL 5 MG PO TABS: ORAL_TABLET | ORAL | 12 refills | Status: DC

## 2016-10-13 NOTE — Progress Notes (Signed)
Patient: Bianca Shaw, Female    DOB: [birth year], 71 y.o.   MRN: [id number] Visit Date: 10/13/2016  Today's Provider: Wilhemena Durie, MD   Chief Complaint  Patient presents with  . Annual Exam   Subjective:  Bianca Shaw is a 71 y.o. female who presents today for health maintenance and complete physical. She feels well. She reports exercising walking 7 days a week. She reports she is sleeping well. Immunization History  Administered Date(s) Administered  . Influenza, High Dose Seasonal PF 02/18/2015  . Influenza,inj,quad, With Preservative 03/05/2016  . Pneumococcal Conjugate-13 09/25/2014  . Pneumococcal Polysaccharide-23 04/03/2010  . Zoster 04/09/2011   Mammogram left mastectomy 01/12/16 BMD 11/05/15 some osteoporosis seen with osteopenia Colonoscopy 11/08/15 diverticulosis in the entire colon, hemorrhoids, repeat in 10 years-2027. Pap smear 09/25/14 normal/negative.-never had hysterectomy.  6CIT Screen 10/08/2016 03/25/2016  What Year? 0 points 0 points  What month? 0 points 0 points  What time? 0 points 0 points  Count back from 20 0 points 4 points  Months in reverse 4 points 4 points  Repeat phrase 6 points 4 points  Total Score 10 12    Fall Risk  10/08/2016 03/25/2016 10/08/2015  Falls in the past year? No No No   Review of Systems  Constitutional: Negative.   HENT: Negative.   Eyes: Negative.   Respiratory: Negative.   Cardiovascular: Negative.   Gastrointestinal: Negative.   Endocrine: Negative.   Genitourinary: Negative.   Musculoskeletal: Negative.   Skin: Negative.   Allergic/Immunologic: Negative.   Neurological: Negative.   Hematological: Negative.   Psychiatric/Behavioral: Negative.     Social History   Social History  . Marital status: Married    Spouse name: N/A  . Number of children: 2  . Years of education: N/A   Occupational History  . Not on file.   Social History Main Topics  . Smoking status: Former Smoker    Packs/day:  0.75    Years: 40.00    Types: Cigarettes    Quit date: 11/07/2003  . Smokeless tobacco: Never Used  . Alcohol use 0.6 - 1.8 oz/week    1 - 3 Glasses of wine per week  . Drug use: No  . Sexual activity: Not on file   Other Topics Concern  . Not on file   Social History Narrative   She drinks 1 to 2 caffeinated beverages a day    Patient Active Problem List   Diagnosis Date Noted  . Elevated troponin 07/16/2016  . Non-ST elevation (NSTEMI) myocardial infarction ([place]) 07/16/2016  . Acute CHF (congestive heart failure) ([place]) 07/16/2016  . Hypokalemia 07/16/2016  . Gastroenteritis 07/16/2016  . Dyspnea   . Smoker   . Coronary artery disease involving native coronary artery of native heart with unstable angina pectoris ([place])   . Hematoma (nontraumatic) of breast 01/13/2016  . Scleroderma ([place]) 12/12/2015  . Breast cancer of upper-outer quadrant of left female breast ([place]) 12/06/2015  . Special screening for malignant neoplasms, colon   . Personal history of tobacco use, presenting hazards to health 10/31/2015  . Anxiety 05/23/2015  . CAFL (chronic airflow limitation) ([place]) 05/23/2015  . Bloodgood disease 05/23/2015  . Essential (primary) hypertension 05/23/2015  . Acid reflux 05/23/2015  . Hypercholesteremia 05/23/2015  . Hyponatremia 05/23/2015  . Osteoporosis 05/23/2015  . Allergic rhinitis, seasonal 05/23/2015  . UNSPECIFIED ANEMIA 10/03/2008    Past Surgical History:  Procedure Laterality Date  . BREAST BIOPSY Right 2012   core - neg  .  BREAST BIOPSY Left 12/03/2015   INVASIVE LOBULAR CARCINOMA.   Marland Kitchen CATARACT EXTRACTION W/ INTRAOCULAR LENS IMPLANT Right   . COLONOSCOPY WITH PROPOFOL N/A 11/08/2015   Procedure: COLONOSCOPY WITH PROPOFOL;  Surgeon: Lucilla Lame, MD;  Location: [facility];  Service: Endoscopy;  Laterality: N/A;  . EVACUATION BREAST HEMATOMA Left 01/14/2016   Procedure: EVACUATION HEMATOMA BREAST;  Surgeon: Robert Bellow, MD;  Location: [facility]  ORS;  Service: General;  Laterality: Left;  Marland Kitchen MASTECTOMY W/ SENTINEL NODE BIOPSY Left 12/26/2015   Procedure: MASTECTOMY WITH SENTINEL LYMPH NODE BIOPSY;  Surgeon: Robert Bellow, MD;  Location: [facility] ORS;  Service: General;  Laterality: Left;  . RIGHT/LEFT HEART CATH AND CORONARY ANGIOGRAPHY N/A 07/22/2016   Procedure: Right/Left Heart Cath and Coronary Angiography;  Surgeon: Minna Merritts, MD;  Location: [facility];  Service: Cardiovascular;  Laterality: N/A;  . TUBAL LIGATION    . VESICOVAGINAL FISTULA CLOSURE W/ TAH      Her family history includes Anxiety disorder in her sister; Arthritis in her brother and sister; COPD in her brother, brother, and father; Epilepsy in her mother; Heart attack in her paternal grandfather; Heart disease in her brother and father; Heart failure in her mother; Kidney failure in her brother; Vaginal cancer in her paternal grandmother.     Outpatient Encounter Prescriptions as of 10/13/2016  Medication Sig  . albuterol (PROVENTIL) (2.5 MG/3ML) 0.083% nebulizer solution Take 3 mLs (2.5 mg total) by nebulization every 6 (six) hours as needed for wheezing or shortness of breath.  Marland Kitchen alendronate (FOSAMAX) 70 MG tablet Take 70 mg by mouth once a week.   . ALPRAZolam (XANAX) 0.5 MG tablet Take 1 tablet (0.5 mg total) by mouth every morning. 1/2 tablet twice daily as needed  . amLODipine-olmesartan (AZOR) 10-40 MG tablet Take 1 tablet by mouth as needed.   Marland Kitchen aspirin 81 MG tablet Take 81 mg by mouth daily.   . busPIRone (BUSPAR) 5 MG tablet 1 Tablet, Oral QHS  . fluticasone (FLONASE) 50 MCG/ACT nasal spray Place 2 sprays into the nose as needed.   . isosorbide mononitrate (IMDUR) 30 MG 24 hr tablet Take 1 tablet (30 mg total) by mouth daily.  . metoprolol succinate (TOPROL-XL) 25 MG 24 hr tablet Take 1 tablet (25 mg total) by mouth daily. (Patient taking differently: Take 25 mg by mouth every morning. )  . nitroGLYCERIN (NITROSTAT) 0.4 MG SL tablet Place 1  tablet (0.4 mg total) under the tongue every 5 (five) minutes as needed for chest pain.  Marland Kitchen omeprazole (PRILOSEC) 20 MG capsule Take 20 mg by mouth as needed.   . potassium chloride SA (K-DUR,KLOR-CON) 20 MEQ tablet Take 1 tablet (20 mEq total) by mouth daily as needed.  . simvastatin (ZOCOR) 40 MG tablet Take 1 tablet (40 mg total) by mouth daily.  . tamoxifen (NOLVADEX) 20 MG tablet Take 1 tablet (20 mg total) by mouth daily.   No facility-administered encounter medications on file as of 10/13/2016.     Patient Care Team: Jerrol Banana., MD as PCP - General (Family Medicine) Byrnett, Forest Gleason, MD (General Surgery) Anthonette Legato, MD as Consulting Physician (Internal Medicine) Jannet Mantis, MD as Consulting Physician (Dermatology) Lequita Asal, MD as Referring Physician (Hematology and Oncology) Anell Barr, OD as Consulting Physician (Optometry) Rockey Situ, Kathlene November, MD as Consulting Physician (Cardiology)      Objective:   Vitals:  Vitals:   10/13/16 0943  BP: 138/76  Pulse:  66  Resp: 12  Weight: 149 lb (67.6 kg)    Physical Exam  Constitutional: She is oriented to person, place, and time. She appears well-developed and well-nourished.  HENT:  Head: Normocephalic and atraumatic.  Right Ear: External ear normal.  Left Ear: External ear normal.  Eyes: Conjunctivae are normal. Pupils are equal, round, and reactive to light.  Neck: Normal range of motion. Neck supple.  Cardiovascular: Normal rate, regular rhythm, normal heart sounds and intact distal pulses.  Exam reveals no gallop.   No murmur heard. Pulmonary/Chest: Effort normal and breath sounds normal. No respiratory distress. She has no wheezes.  Left mastectomy  Abdominal: Soft. Bowel sounds are normal. She exhibits no distension. There is no tenderness.  Genitourinary:  Genitourinary Comments: defer  Musculoskeletal: She exhibits no edema or tenderness.  Neurological: She is alert and  oriented to person, place, and time. No cranial nerve deficit.  Skin: No rash noted. No erythema.  Psychiatric: She has a normal mood and affect. Her behavior is normal. Judgment and thought content normal.   Depression Screen PHQ 2/9 Scores 10/08/2016 10/08/2016 03/25/2016 10/08/2015  PHQ - 2 Score 0 0 0 0  PHQ- 9 Score 0 - - -    Assessment & Plan:     Routine Health Maintenance and Physical Exam  Exercise Activities and Dietary recommendations Goals    . exercise          Starting 03/25/16, I will start walking 15 minutes twice a week.    . Exercise 3x per week (30 min per time)          Recommend increasing exercise to 30 minutes for 3 days a week. Pt to start walking.       2. Essential (primary) hypertension  3. Anxiety Refill for Xanax provided.  4. Malignant neoplasm of upper-outer quadrant of left female breast, unspecified estrogen receptor status ([place])  5. Age-related osteoporosis without current pathological fracture  6. S/P mastectomy, left  HPI, Exam and A&P transcribed by Theressa Millard, RMA under direction and in the presence of Miguel Aschoff, MD.  I have done the exam and reviewed the chart and it is accurate to the best of my knowledge. Development worker, community has been used and  any errors in dictation or transcription are unintentional. Miguel Aschoff M.D. [facility]

## 2016-10-19 ENCOUNTER — Other Ambulatory Visit: Payer: Self-pay | Admitting: *Deleted

## 2016-10-19 ENCOUNTER — Inpatient Hospital Stay: Payer: PPO | Attending: Hematology and Oncology | Admitting: *Deleted

## 2016-10-19 DIAGNOSIS — E871 Hypo-osmolality and hyponatremia: Secondary | ICD-10-CM | POA: Diagnosis not present

## 2016-10-19 DIAGNOSIS — Z170 Estrogen receptor positive status [ER+]: Secondary | ICD-10-CM | POA: Diagnosis not present

## 2016-10-19 DIAGNOSIS — F17210 Nicotine dependence, cigarettes, uncomplicated: Secondary | ICD-10-CM | POA: Diagnosis not present

## 2016-10-19 DIAGNOSIS — D649 Anemia, unspecified: Secondary | ICD-10-CM | POA: Diagnosis not present

## 2016-10-19 DIAGNOSIS — Z9012 Acquired absence of left breast and nipple: Secondary | ICD-10-CM | POA: Diagnosis not present

## 2016-10-19 DIAGNOSIS — Z79810 Long term (current) use of selective estrogen receptor modulators (SERMs): Secondary | ICD-10-CM | POA: Diagnosis not present

## 2016-10-19 DIAGNOSIS — C50412 Malignant neoplasm of upper-outer quadrant of left female breast: Secondary | ICD-10-CM | POA: Diagnosis not present

## 2016-10-19 LAB — CBC WITH DIFFERENTIAL/PLATELET
Basophils Absolute: 0.1 10*3/uL (ref 0–0.1)
Basophils Relative: 2 %
Eosinophils Absolute: 0.2 10*3/uL (ref 0–0.7)
Eosinophils Relative: 4 %
HCT: 27.5 % — ABNORMAL LOW (ref 35.0–47.0)
Hemoglobin: 9.8 g/dL — ABNORMAL LOW (ref 12.0–16.0)
Lymphocytes Relative: 20 %
Lymphs Abs: 0.9 10*3/uL — ABNORMAL LOW (ref 1.0–3.6)
MCH: 34.9 pg — ABNORMAL HIGH (ref 26.0–34.0)
MCHC: 35.6 g/dL (ref 32.0–36.0)
MCV: 97.8 fL (ref 80.0–100.0)
Monocytes Absolute: 0.7 10*3/uL (ref 0.2–0.9)
Monocytes Relative: 15 %
Neutro Abs: 2.7 10*3/uL (ref 1.4–6.5)
Neutrophils Relative %: 59 %
Platelets: 251 10*3/uL (ref 150–440)
RBC: 2.81 MIL/uL — ABNORMAL LOW (ref 3.80–5.20)
RDW: 13.5 % (ref 11.5–14.5)
WBC: 4.5 10*3/uL (ref 3.6–11.0)

## 2016-10-19 LAB — DAT, POLYSPECIFIC AHG (ARMC ONLY): Polyspecific AHG test: NEGATIVE

## 2016-10-19 LAB — FERRITIN: Ferritin: 61 ng/mL (ref 11–307)

## 2016-11-23 ENCOUNTER — Other Ambulatory Visit: Payer: Self-pay | Admitting: *Deleted

## 2016-11-23 DIAGNOSIS — C50412 Malignant neoplasm of upper-outer quadrant of left female breast: Secondary | ICD-10-CM

## 2016-11-23 DIAGNOSIS — D649 Anemia, unspecified: Secondary | ICD-10-CM

## 2016-12-08 ENCOUNTER — Other Ambulatory Visit: Payer: Self-pay | Admitting: General Surgery

## 2016-12-08 ENCOUNTER — Inpatient Hospital Stay: Payer: PPO | Attending: Hematology and Oncology

## 2016-12-08 ENCOUNTER — Encounter: Payer: Self-pay | Admitting: Hematology and Oncology

## 2016-12-08 ENCOUNTER — Inpatient Hospital Stay (HOSPITAL_BASED_OUTPATIENT_CLINIC_OR_DEPARTMENT_OTHER): Payer: PPO | Admitting: Hematology and Oncology

## 2016-12-08 ENCOUNTER — Ambulatory Visit
Admission: RE | Admit: 2016-12-08 | Discharge: 2016-12-08 | Disposition: A | Discharge status: Home / Self Care | Payer: PPO | Source: Ambulatory Visit | Attending: General Surgery | Admitting: General Surgery

## 2016-12-08 VITALS — BP 133/71 | HR 54 | Temp 96.2°F | Resp 18 | Wt 149.1 lb

## 2016-12-08 DIAGNOSIS — Z9012 Acquired absence of left breast and nipple: Secondary | ICD-10-CM | POA: Insufficient documentation

## 2016-12-08 DIAGNOSIS — E871 Hypo-osmolality and hyponatremia: Secondary | ICD-10-CM | POA: Insufficient documentation

## 2016-12-08 DIAGNOSIS — K219 Gastro-esophageal reflux disease without esophagitis: Secondary | ICD-10-CM | POA: Insufficient documentation

## 2016-12-08 DIAGNOSIS — C50412 Malignant neoplasm of upper-outer quadrant of left female breast: Secondary | ICD-10-CM | POA: Diagnosis not present

## 2016-12-08 DIAGNOSIS — Z79810 Long term (current) use of selective estrogen receptor modulators (SERMs): Secondary | ICD-10-CM

## 2016-12-08 DIAGNOSIS — Z170 Estrogen receptor positive status [ER+]: Secondary | ICD-10-CM | POA: Diagnosis not present

## 2016-12-08 DIAGNOSIS — Z87891 Personal history of nicotine dependence: Secondary | ICD-10-CM | POA: Diagnosis not present

## 2016-12-08 DIAGNOSIS — D649 Anemia, unspecified: Secondary | ICD-10-CM | POA: Diagnosis not present

## 2016-12-08 DIAGNOSIS — Z1231 Encounter for screening mammogram for malignant neoplasm of breast: Secondary | ICD-10-CM

## 2016-12-08 DIAGNOSIS — I10 Essential (primary) hypertension: Secondary | ICD-10-CM | POA: Diagnosis not present

## 2016-12-08 DIAGNOSIS — Z7982 Long term (current) use of aspirin: Secondary | ICD-10-CM | POA: Insufficient documentation

## 2016-12-08 DIAGNOSIS — Z90710 Acquired absence of both cervix and uterus: Secondary | ICD-10-CM | POA: Insufficient documentation

## 2016-12-08 DIAGNOSIS — E538 Deficiency of other specified B group vitamins: Secondary | ICD-10-CM

## 2016-12-08 DIAGNOSIS — F419 Anxiety disorder, unspecified: Secondary | ICD-10-CM | POA: Diagnosis not present

## 2016-12-08 DIAGNOSIS — Z79899 Other long term (current) drug therapy: Secondary | ICD-10-CM | POA: Insufficient documentation

## 2016-12-08 LAB — COMPREHENSIVE METABOLIC PANEL
ALT: 14 U/L (ref 14–54)
AST: 22 U/L (ref 15–41)
Albumin: 3.8 g/dL (ref 3.5–5.0)
Alkaline Phosphatase: 39 U/L (ref 38–126)
Anion gap: 7 (ref 5–15)
BUN: 18 mg/dL (ref 6–20)
CO2: 23 mmol/L (ref 22–32)
Calcium: 8.8 mg/dL — ABNORMAL LOW (ref 8.9–10.3)
Chloride: 97 mmol/L — ABNORMAL LOW (ref 101–111)
Creatinine, Ser: 1.12 mg/dL — ABNORMAL HIGH (ref 0.44–1.00)
GFR calc Af Amer: 56 mL/min — ABNORMAL LOW (ref >60)
GFR calc non Af Amer: 48 mL/min — ABNORMAL LOW (ref >60)
Glucose, Bld: 120 mg/dL — ABNORMAL HIGH (ref 65–99)
Potassium: 4.6 mmol/L (ref 3.5–5.1)
Sodium: 127 mmol/L — ABNORMAL LOW (ref 135–145)
Total Bilirubin: 0.5 mg/dL (ref 0.3–1.2)
Total Protein: 7.5 g/dL (ref 6.5–8.1)

## 2016-12-08 LAB — CBC WITH DIFFERENTIAL/PLATELET
Basophils Absolute: 0.1 10*3/uL (ref 0–0.1)
Basophils Relative: 2 %
Eosinophils Absolute: 0.1 10*3/uL (ref 0–0.7)
Eosinophils Relative: 3 %
HCT: 30.3 % — ABNORMAL LOW (ref 35.0–47.0)
Hemoglobin: 10.6 g/dL — ABNORMAL LOW (ref 12.0–16.0)
Lymphocytes Relative: 18 %
Lymphs Abs: 0.9 10*3/uL — ABNORMAL LOW (ref 1.0–3.6)
MCH: 34.7 pg — ABNORMAL HIGH (ref 26.0–34.0)
MCHC: 35 g/dL (ref 32.0–36.0)
MCV: 99.1 fL (ref 80.0–100.0)
Monocytes Absolute: 0.6 10*3/uL (ref 0.2–0.9)
Monocytes Relative: 14 %
Neutro Abs: 3 10*3/uL (ref 1.4–6.5)
Neutrophils Relative %: 63 %
Platelets: 249 10*3/uL (ref 150–440)
RBC: 3.06 MIL/uL — ABNORMAL LOW (ref 3.80–5.20)
RDW: 12.8 % (ref 11.5–14.5)
WBC: 4.8 10*3/uL (ref 3.6–11.0)

## 2016-12-08 LAB — VITAMIN B12: Vitamin B-12: 299 pg/mL (ref 180–914)

## 2016-12-08 NOTE — Progress Notes (Signed)
[facility] day:  12/08/2016   Chief Complaint: Bianca Shaw is a 71 y.o. female with stage IIA left breast cancer who is seen for 3 month assessment on tamoxifen.  HPI:  The patient was last seen in the medical oncology clinic on 09/07/2016.  At that time, she denied any complaints.  Exam was stable.  Hematocrit was 28.7 with a hemoglobin of 10.1.  During the interim, she has done well.  She notes a small place at the site of her mastectomy that she is concerned about.   She denies any other symptoms.   Past Medical History:  Diagnosis Date  . Anemia   . Anxiety disorder   . Breast cancer of upper-outer quadrant of left female breast ([place]) 11/2015   pT2 pN0(i+).;ER+; PR +, her 2 neu not overexpressed.  Mastectomy, SLN, Mammoprint: Low risk.   . Cancer ([place]) 12/03/2015   left breast/ INVASIVE LOBULAR CARCINOMA.   . Cough    lingering, mild, finished Prednisone and anitbiotic 11/03/15  . Family history of adverse reaction to anesthesia    sister - PONV  . GERD (gastroesophageal reflux disease)   . H/O: hysterectomy   . Hypertension   . Osteopenia   . Osteoporosis   . Pericarditis    diagnonsed June, 2010, unclear etiology as of yer  . Personal history of tobacco use, presenting hazards to health 10/31/2015  . Scleroderma ([facility])    ONLY ON SKIN-MILD  . UTI (lower urinary tract infection)   . Wears dentures    full upper    Past Surgical History:  Procedure Laterality Date  . BREAST BIOPSY Right 2012   core - neg  . BREAST BIOPSY Left 12/03/2015   INVASIVE LOBULAR CARCINOMA.   Marland Kitchen CATARACT EXTRACTION W/ INTRAOCULAR LENS IMPLANT Right   . COLONOSCOPY WITH PROPOFOL N/A 11/08/2015   Procedure: COLONOSCOPY WITH PROPOFOL;  Surgeon: Lucilla Lame, MD;  Location: [facility];  Service: Endoscopy;  Laterality: N/A;  . EVACUATION BREAST HEMATOMA Left 01/14/2016   Procedure: EVACUATION HEMATOMA BREAST;  Surgeon: Robert Bellow, MD;   Location: [facility] ORS;  Service: General;  Laterality: Left;  Marland Kitchen MASTECTOMY Left 2017   complete mastectomy  . MASTECTOMY W/ SENTINEL NODE BIOPSY Left 12/26/2015   Procedure: MASTECTOMY WITH SENTINEL LYMPH NODE BIOPSY;  Surgeon: Robert Bellow, MD;  Location: [facility] ORS;  Service: General;  Laterality: Left;  . RIGHT/LEFT HEART CATH AND CORONARY ANGIOGRAPHY N/A 07/22/2016   Procedure: Right/Left Heart Cath and Coronary Angiography;  Surgeon: Minna Merritts, MD;  Location: [facility];  Service: Cardiovascular;  Laterality: N/A;  . TUBAL LIGATION    . VESICOVAGINAL FISTULA CLOSURE W/ TAH      Family History  Problem Relation Age of Onset  . Heart failure Mother   . Epilepsy Mother   . COPD Father   . Heart disease Father   . Anxiety disorder Sister   . Arthritis Brother   . Heart disease Brother   . Vaginal cancer Paternal Grandmother   . Heart attack Paternal Grandfather   . COPD Brother   . Kidney failure Brother   . COPD Brother   . Arthritis Sister   . Uterine cancer Unknown   . Diabetes Unknown   . Colon cancer Neg Hx   . Stomach cancer Neg Hx   . Breast cancer Neg Hx     Social History:  reports that she quit smoking about 13  years ago. Her smoking use included Cigarettes. She has a 30.00 pack-year smoking history. She has never used smokeless tobacco. She reports that she drinks about 0.6 - 1.8 oz of alcohol per week . She reports that she does not use drugs.  She has 5 brothers and 2 sisters.  She has 2 children who are alive and well.  She lives in [place] with her husband, Pat Patrick.  The patient is accompanied by her husband today.  Allergies:  Allergies  Allergen Reactions  . Diuretic  [Buchu-Cornsilk-Ch Grass-Hydran]     hyponatremia  . Furosemide     hives  . Other     SSRI---hyponatremia  . Pimenta Nausea And Vomiting    Current Medications: Current Outpatient Prescriptions  Medication Sig Dispense Refill  . albuterol (PROVENTIL) (2.5 MG/3ML) 0.083%  nebulizer solution Take 3 mLs (2.5 mg total) by nebulization every 6 (six) hours as needed for wheezing or shortness of breath. 75 mL 12  . alendronate (FOSAMAX) 70 MG tablet Take 70 mg by mouth once a week.     . ALPRAZolam (XANAX) 0.5 MG tablet 1/2 tablet twice daily as needed 30 tablet 5  . amLODipine-olmesartan (AZOR) 10-40 MG tablet Take 1 tablet by mouth as needed. 30 tablet 12  . aspirin 81 MG tablet Take 81 mg by mouth daily.     . busPIRone (BUSPAR) 5 MG tablet 1 Tablet, Oral QHS 30 tablet 12  . fluticasone (FLONASE) 50 MCG/ACT nasal spray Place 2 sprays into the nose as needed.     . isosorbide mononitrate (IMDUR) 30 MG 24 hr tablet Take 1 tablet (30 mg total) by mouth daily. 30 tablet 0  . metoprolol succinate (TOPROL-XL) 25 MG 24 hr tablet Take 1 tablet (25 mg total) by mouth daily. (Patient taking differently: Take 25 mg by mouth every morning. ) 30 tablet 12  . nitroGLYCERIN (NITROSTAT) 0.4 MG SL tablet Place 1 tablet (0.4 mg total) under the tongue every 5 (five) minutes as needed for chest pain. 30 tablet 0  . omeprazole (PRILOSEC) 20 MG capsule Take 20 mg by mouth as needed.     . potassium chloride SA (K-DUR,KLOR-CON) 20 MEQ tablet Take 1 tablet (20 mEq total) by mouth daily as needed. 90 tablet 3  . simvastatin (ZOCOR) 40 MG tablet Take 1 tablet (40 mg total) by mouth daily. 30 tablet 0  . tamoxifen (NOLVADEX) 20 MG tablet Take 1 tablet (20 mg total) by mouth daily. 90 tablet 3   No current facility-administered medications for this visit.     Review of Systems:  GENERAL:  Feels good.  No fevers or sweats.  Weight up 5 pounds. PERFORMANCE STATUS (ECOG): 0 HEENT:  No visual changes, runny nose, sore throat, mouth sores or tenderness. Lungs: No shortness of breath or cough.  No hemoptysis. Cardiac:  No chest pain, palpitations, orthopnea, or PND. GI:  Eating well.  No nausea, vomiting, diarrhea, constipation, melena or hematochezia.  Colonoscopy done 09/2015. GU:  No  urgency, frequency, dysuria, or hematuria. Musculoskeletal:  Osteoporosis on Fosamax.  No back pain.  No joint pain.  No muscle tenderness. Extremities:  No pain or swelling. Skin:  Skin changes due to scleroderma.  s/p left mastectomy.  Area at mastectomy site of concern.  No rashes or skin changes. Neuro:  No headache, numbness or weakness, balance or coordination issues. Endocrine:  No diabetes, thyroid issues, hot flashes or night sweats. Psych:  No mood changes, depression or anxiety. Pain:  No focal  pain. Review of systems:  All other systems reviewed and found to be negative.  Physical Exam: 137 Blood pressure 133/71, pulse (!) 54, temperature (!) 96.2 F (35.7 C), temperature source Tympanic, resp. rate 18, weight 149 lb 2 oz (67.6 kg). GENERAL:  Well developed, well nourished, woman sitting comfortably in the exam room in no acute distress. MENTAL STATUS:  Alert and oriented to person, place and time. HEAD:  Short gray hair.  Normocephalic, atraumatic, face symmetric, no Cushingoid features. EYES:  Blue eyes.  Pupils equal round and reactive to light and accomodation.  No conjunctivitis or scleral icterus. ENT:  Oropharynx clear without lesion.  Tongue normal. Mucous membranes moist.  RESPIRATORY:  Clear to auscultation without rales, wheezes or rhonchi. CARDIOVASCULAR:  Regular rate and rhythm without murmur, rub or gallop. BREAST:  Right breast without masses, skin changes or nipple discharge.  Left sided mastectomy site with 4 mm (fingertip) nodule/scar above the incision in the axillae without overlying erythema or induration. ABDOMEN:  Soft, non-tender, with active bowel sounds, and no hepatosplenomegaly.  No masses. SKIN:  No rashes, ulcers or lesions. EXTREMITIES: No edema, no skin discoloration or tenderness.  No palpable cords. LYMPH NODES: No palpable cervical, supraclavicular, axillary or inguinal adenopathy  NEUROLOGICAL: Unremarkable. PSYCH:   Appropriate.   Appointment on 12/08/2016  Component Date Value Ref Range Status  . WBC 12/08/2016 4.8  3.6 - 11.0 K/uL Final  . RBC 12/08/2016 3.06* 3.80 - 5.20 MIL/uL Final  . Hemoglobin 12/08/2016 10.6* 12.0 - 16.0 g/dL Final  . HCT 12/08/2016 30.3* 35.0 - 47.0 % Final  . MCV 12/08/2016 99.1  80.0 - 100.0 fL Final  . MCH 12/08/2016 34.7* 26.0 - 34.0 pg Final  . MCHC 12/08/2016 35.0  32.0 - 36.0 g/dL Final  . RDW 12/08/2016 12.8  11.5 - 14.5 % Final  . Platelets 12/08/2016 249  150 - 440 K/uL Final  . Neutrophils Relative % 12/08/2016 63  % Final  . Neutro Abs 12/08/2016 3.0  1.4 - 6.5 K/uL Final  . Lymphocytes Relative 12/08/2016 18  % Final  . Lymphs Abs 12/08/2016 0.9* 1.0 - 3.6 K/uL Final  . Monocytes Relative 12/08/2016 14  % Final  . Monocytes Absolute 12/08/2016 0.6  0.2 - 0.9 K/uL Final  . Eosinophils Relative 12/08/2016 3  % Final  . Eosinophils Absolute 12/08/2016 0.1  0 - 0.7 K/uL Final  . Basophils Relative 12/08/2016 2  % Final  . Basophils Absolute 12/08/2016 0.1  0 - 0.1 K/uL Final  . Sodium 12/08/2016 127* 135 - 145 mmol/L Final  . Potassium 12/08/2016 4.6  3.5 - 5.1 mmol/L Final  . Chloride 12/08/2016 97* 101 - 111 mmol/L Final  . CO2 12/08/2016 23  22 - 32 mmol/L Final  . Glucose, Bld 12/08/2016 120* 65 - 99 mg/dL Final  . BUN 12/08/2016 18  6 - 20 mg/dL Final  . Creatinine, Ser 12/08/2016 1.12* 0.44 - 1.00 mg/dL Final  . Calcium 12/08/2016 8.8* 8.9 - 10.3 mg/dL Final  . Total Protein 12/08/2016 7.5  6.5 - 8.1 g/dL Final  . Albumin 12/08/2016 3.8  3.5 - 5.0 g/dL Final  . AST 12/08/2016 22  15 - 41 U/L Final  . ALT 12/08/2016 14  14 - 54 U/L Final  . Alkaline Phosphatase 12/08/2016 39  38 - 126 U/L Final  . Total Bilirubin 12/08/2016 0.5  0.3 - 1.2 mg/dL Final  . GFR calc non Af Amer 12/08/2016 48* >60 mL/min Final  .  GFR calc Af Amer 12/08/2016 56* >60 mL/min Final   Comment: (NOTE) The eGFR has been calculated using the CKD EPI equation. This calculation  has not been validated in all clinical situations. eGFR's persistently <60 mL/min signify possible Chronic Kidney Disease.   . Anion gap 12/08/2016 7  5 - 15 Final    Assessment:  MARSENA TAFF is a 71 y.o. female with stage IIA (T2N0) left breast cancer s/p mastectomy with sentinel lymph node biopsy on 12/26/2015.   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  Margins were negative.  Two sentinel lymph nodes were negative for macrometastasis, but with isolated tumor cells on IHC stains.  Three additional lymph nodes were positive for isolated tumor cells on IHC.  Tumor was ER positive (> 90%), PR positive (> 90%), and Her2/neu 2+ (eqivocal).  Her2/neu by FISH was negative.  Pathologic stage was pT2 pN0(i+).  MammaPrint testing revealed low risk luminal type A. There was a 97.8% probability of being disease free at 10 years with hormonal therapy.   Left mammogram and ultrasound on 11/21/2015 revealed a 4.2 x 2.4 x 2.6 cm irregular hypoechoic mass in the 1-2 o'clock position in the left breast 3 cm from the nipple.  Left axillary lymph nodes appeared normal.  Breast MRI on 12/25/2015 revealed a 6.2 cm area of abnormal enhancement in the upper outer quadrant of the left breast.  The right breast revealed no mass or abnormal enhancement.  She began tamoxifen on 02/04/2016.  She is tolerating it well.  CA27.29 has been followed: 27.3 on 12/10/2015, 19.4 on 05/30/2016, 16.4 on 09/07/2016, and 17.2 on 12/08/2016.  Bone density on 11/05/2015 revealed osteoporosis with a T score of -2.9 in the AP spine L1-L2 and -1.9 in the left femoral neck.  She started Fosamax in 10/2015.  She is on calcium and vitamin D.  She has a normocytic anemia.  Hematocrit has drifted down from 32 since 11/2015.  Diet is good.  She had a colonoscopy in 09/2015.  She denies any melena, hematochezia, hematuria or vaginal bleeding.  Anemia work-up on 06/09/2016 revealed  the following normal studies: ferritin (64), iron studies, folate, TSH.  B12 was 295 (low normal) with a normal MMA thus r/o B12 deficiency.  Retic was 1.2% (inappropriately low) on 09/07/2016.  Coombs was negative on 10/19/2016.    She has scleroderma.  She has skin thickening at sites of abrasion.  She has a history of chronic hyponatremia.  She is on fluid restriction.  Symptomatically, she denies any complaint.  Exam reveals a 4 mm nodule in the left axillae above the mastectomy incision.  Hematocrit is 28.7 with a hemoglobin of 10.1.  Plan: 1.  Labs today:  CBC with diff, CMP, CA27.29, B12, SPEP, free light chains. 2.  Discuss improvement in anemia.  Await pending studies today.  Continue to observe without intervention. 3.  Discuss small (4 mm) left axillary nodule above mastectomy incision.  Etiology unclear (? scar/fibrosis).  Patient to be seen by Dr. Bary Castilla next week. 4.  Continue tamoxifen. 4.  Continue Fosamax, calcium and vitamin D. 6.  RTC in 4 months for MD assessment and labs (CBC with diff, CMP, CA27.29).  Addendum:  B12 was 299.  SPEP revealed no monoclonal protein.  Kappa free light chains were 55.0 (elevated), lambda free light chains were 22.6 with a ratio of 2.43 (0.26 - 1.65).   Consider 24 hour UPEP with free light chains.  Lequita Asal, MD  12/08/2016, 12:12 PM

## 2016-12-08 NOTE — Progress Notes (Signed)
Patient has an area where she had her mastectomy that she would like checked today.

## 2016-12-09 ENCOUNTER — Telehealth: Payer: Self-pay | Admitting: *Deleted

## 2016-12-09 LAB — CANCER ANTIGEN 27.29: CA 27.29: 17.2 U/mL (ref 0.0–38.6)

## 2016-12-09 LAB — PROTEIN ELECTROPHORESIS, SERUM
A/G Ratio: 1 (ref 0.7–1.7)
Albumin ELP: 3.6 g/dL (ref 2.9–4.4)
Alpha-1-Globulin: 0.2 g/dL (ref 0.0–0.4)
Alpha-2-Globulin: 0.7 g/dL (ref 0.4–1.0)
Beta Globulin: 1 g/dL (ref 0.7–1.3)
Gamma Globulin: 1.6 g/dL (ref 0.4–1.8)
Globulin, Total: 3.6 g/dL (ref 2.2–3.9)
Total Protein ELP: 7.2 g/dL (ref 6.0–8.5)

## 2016-12-09 LAB — KAPPA/LAMBDA LIGHT CHAINS
Kappa free light chain: 55 mg/L — ABNORMAL HIGH (ref 3.3–19.4)
Kappa, lambda light chain ratio: 2.43 — ABNORMAL HIGH (ref 0.26–1.65)
Lambda free light chains: 22.6 mg/L (ref 5.7–26.3)

## 2016-12-09 NOTE — Telephone Encounter (Signed)
Called patient to inform her that tumor marker is normal.  Patient verbalized understanding and was appreciative of call.

## 2016-12-09 NOTE — Telephone Encounter (Signed)
-----   Message from Lequita Asal, MD sent at 12/09/2016  8:56 AM EDT ----- Regarding: Please call patient  CA27.29 normal.   ----- Message ----- From: Interface, Lab In [facility] Sent: 12/08/2016  10:50 AM To: Lequita Asal, MD

## 2016-12-15 ENCOUNTER — Ambulatory Visit (INDEPENDENT_AMBULATORY_CARE_PROVIDER_SITE_OTHER): Payer: PPO | Admitting: General Surgery

## 2016-12-15 ENCOUNTER — Encounter: Payer: Self-pay | Admitting: General Surgery

## 2016-12-15 VITALS — BP 122/64 | HR 64 | Resp 12 | Ht 60.0 in | Wt 150.0 lb

## 2016-12-15 DIAGNOSIS — C50412 Malignant neoplasm of upper-outer quadrant of left female breast: Secondary | ICD-10-CM | POA: Diagnosis not present

## 2016-12-15 DIAGNOSIS — R222 Localized swelling, mass and lump, trunk: Secondary | ICD-10-CM

## 2016-12-15 DIAGNOSIS — Z170 Estrogen receptor positive status [ER+]: Secondary | ICD-10-CM | POA: Diagnosis not present

## 2016-12-15 DIAGNOSIS — M7989 Other specified soft tissue disorders: Secondary | ICD-10-CM | POA: Diagnosis not present

## 2016-12-15 DIAGNOSIS — D213 Benign neoplasm of connective and other soft tissue of thorax: Secondary | ICD-10-CM

## 2016-12-15 NOTE — Progress Notes (Signed)
Patient ID: Bianca Shaw, female   DOB: May 10, 1945, 71 y.o.   MRN: [id number]  Chief Complaint  Patient presents with  . Follow-up    HPI Bianca Shaw is a 71 y.o. female who presents for a breast evaluation. The most recent right breast mammogram was done on 12/08/2016.  Patient does perform regular self breast checks and gets regular mammograms done.    HPI  Past Medical History:  Diagnosis Date  . Anemia   . Anxiety disorder   . Breast cancer of upper-outer quadrant of left female breast ([place]) 11/2015   pT2 pN0(i+).;ER+; PR +, her 2 neu not overexpressed.  Mastectomy, SLN, Mammoprint: Low risk.   . Cancer ([place]) 12/03/2015   left breast/ INVASIVE LOBULAR CARCINOMA.   . Cough    lingering, mild, finished Prednisone and anitbiotic 11/03/15  . Family history of adverse reaction to anesthesia    sister - PONV  . GERD (gastroesophageal reflux disease)   . H/O: hysterectomy   . Hypertension   . Osteopenia   . Osteoporosis   . Pericarditis    diagnonsed June, 2010, unclear etiology as of yer  . Personal history of tobacco use, presenting hazards to health 10/31/2015  . Scleroderma ([facility])    ONLY ON SKIN-MILD  . UTI (lower urinary tract infection)   . Wears dentures    full upper    Past Surgical History:  Procedure Laterality Date  . BREAST BIOPSY Right 2012   core - neg  . BREAST BIOPSY Left 12/03/2015   INVASIVE LOBULAR CARCINOMA.   Marland Kitchen CATARACT EXTRACTION W/ INTRAOCULAR LENS IMPLANT Right   . COLONOSCOPY WITH PROPOFOL N/A 11/08/2015   Procedure: COLONOSCOPY WITH PROPOFOL;  Surgeon: Lucilla Lame, MD;  Location: [facility];  Service: Endoscopy;  Laterality: N/A;  . EVACUATION BREAST HEMATOMA Left 01/14/2016   Procedure: EVACUATION HEMATOMA BREAST;  Surgeon: Robert Bellow, MD;  Location: [facility];  Service: General;  Laterality: Left;  Marland Kitchen MASTECTOMY Left 2017   complete mastectomy  . MASTECTOMY W/ SENTINEL NODE BIOPSY Left 12/26/2015   Procedure: MASTECTOMY  WITH SENTINEL LYMPH NODE BIOPSY;  Surgeon: Robert Bellow, MD;  Location: [facility];  Service: General;  Laterality: Left;  . RIGHT/LEFT HEART CATH AND CORONARY ANGIOGRAPHY N/A 07/22/2016   Procedure: Right/Left Heart Cath and Coronary Angiography;  Surgeon: Minna Merritts, MD;  Location: [facility];  Service: Cardiovascular;  Laterality: N/A;  . TUBAL LIGATION    . VESICOVAGINAL FISTULA CLOSURE W/ TAH      Family History  Problem Relation Age of Onset  . Heart failure Mother   . Epilepsy Mother   . COPD Father   . Heart disease Father   . Anxiety disorder Sister   . Arthritis Brother   . Heart disease Brother   . Vaginal cancer Paternal Grandmother   . Heart attack Paternal Grandfather   . COPD Brother   . Kidney failure Brother   . COPD Brother   . Arthritis Sister   . Uterine cancer Unknown   . Diabetes Unknown   . Colon cancer Neg Hx   . Stomach cancer Neg Hx   . Breast cancer Neg Hx     Social History Social History  Substance Use Topics  . Smoking status: Former Smoker    Packs/day: 0.75    Years: 40.00    Types: Cigarettes    Quit date: 11/07/2003  . Smokeless tobacco: Never Used  . Alcohol use 0.6 -  1.8 oz/week    1 - 3 Glasses of wine per week    Allergies  Allergen Reactions  . Diuretic  [Buchu-Cornsilk-Ch Grass-Hydran]     hyponatremia  . Furosemide     hives  . Other     SSRI---hyponatremia  . Pimenta Nausea And Vomiting    Current Outpatient Prescriptions  Medication Sig Dispense Refill  . albuterol (PROVENTIL) (2.5 MG/3ML) 0.083% nebulizer solution Take 3 mLs (2.5 mg total) by nebulization every 6 (six) hours as needed for wheezing or shortness of breath. 75 mL 12  . alendronate (FOSAMAX) 70 MG tablet Take 70 mg by mouth once a week.     . ALPRAZolam (XANAX) 0.5 MG tablet 1/2 tablet twice daily as needed 30 tablet 5  . amLODipine-olmesartan (AZOR) 10-40 MG tablet Take 1 tablet by mouth as needed. 30 tablet 12  . aspirin 81 MG tablet  Take 81 mg by mouth daily.     . busPIRone (BUSPAR) 5 MG tablet 1 Tablet, Oral QHS 30 tablet 12  . fluticasone (FLONASE) 50 MCG/ACT nasal spray Place 2 sprays into the nose as needed.     . isosorbide mononitrate (IMDUR) 30 MG 24 hr tablet Take 1 tablet (30 mg total) by mouth daily. 30 tablet 0  . metoprolol succinate (TOPROL-XL) 25 MG 24 hr tablet Take 1 tablet (25 mg total) by mouth daily. (Patient taking differently: Take 25 mg by mouth every morning. ) 30 tablet 12  . nitroGLYCERIN (NITROSTAT) 0.4 MG SL tablet Place 1 tablet (0.4 mg total) under the tongue every 5 (five) minutes as needed for chest pain. 30 tablet 0  . omeprazole (PRILOSEC) 20 MG capsule Take 20 mg by mouth as needed.     . potassium chloride SA (K-DUR,KLOR-CON) 20 MEQ tablet Take 1 tablet (20 mEq total) by mouth daily as needed. 90 tablet 3  . simvastatin (ZOCOR) 40 MG tablet Take 1 tablet (40 mg total) by mouth daily. 30 tablet 0  . tamoxifen (NOLVADEX) 20 MG tablet Take 1 tablet (20 mg total) by mouth daily. 90 tablet 3   No current facility-administered medications for this visit.     Review of Systems Review of Systems  Constitutional: Negative.   Respiratory: Negative.   Cardiovascular: Negative.     Blood pressure 122/64, pulse 64, resp. rate 12, height 5' (1.524 m), weight 150 lb (68 kg).  Physical Exam Physical Exam  Constitutional: She is oriented to person, place, and time. She appears well-developed and well-nourished.  Eyes: Conjunctivae are normal. No scleral icterus.  Neck: Neck supple.  Cardiovascular: Normal rate, regular rhythm and normal heart sounds.   Pulmonary/Chest: Effort normal and breath sounds normal. Right breast exhibits no inverted nipple, no mass, no nipple discharge, no skin change and no tenderness. Left breast exhibits no inverted nipple, no mass, no nipple discharge, no skin change and no tenderness.    5 mm nodular above scar left breast.   Abdominal: Soft. Bowel sounds are  normal. There is no tenderness.  Lymphadenopathy:    She has no cervical adenopathy.    She has no axillary adenopathy.       Left: No supraclavicular adenopathy present.  Neurological: She is alert and oriented to person, place, and time.  Skin: Skin is warm and dry.    Data Reviewed Medical oncology note of 12/08/2016. Right breast mammogram dated 12/08/2016 was reviewed. No interval change. BI-RADS-1.  Assessment    New chest wall nodule, likely fibrotic. Possibly related to  history of scleroderma.    Plan    Excision was recommended and accepted. After alcohol prep 10 mL of 0.5% Xylocaine with 0.25% Marcaine with 1-200,000 of epinephrine was utilized well tolerated. ChloraPrep was applied to the skin. A transverse incision was made parallel to the mastectomy incision. A rubbery mass of white fibrotic tissue was excised and sent in formalin for routine histology. The deep tissue was approximated with interrupted 3-0 Vicryl sutures. Skin was closed with interrupted 4-0 nylon sutures. Scant bleeding was noted. Telfa and Tegaderm dressing applied.  Post biopsy wound care reviewed. Ice pack provided.     Patient to return in ine week. The patient is aware to call back for any questions or concerns.   Assuming pathology confirms a benign process we'll arrange for follow-up examination in one year.  HPI, Physical Exam, Assessment and Plan have been scribed under the direction and in the presence of Hervey Ard, MD.  Gaspar Cola, CMA  I have completed the exam and reviewed the above documentation for accuracy and completeness.  I agree with the above.  Haematologist has been used and any errors in dictation or transcription are unintentional.  Hervey Ard, M.D., F.A.C.S.   Robert Bellow 12/15/2016, 8:44 PM

## 2016-12-15 NOTE — Patient Instructions (Signed)
Return in one week nurse

## 2016-12-21 ENCOUNTER — Telehealth: Payer: Self-pay | Admitting: *Deleted

## 2016-12-21 ENCOUNTER — Other Ambulatory Visit: Payer: Self-pay | Admitting: *Deleted

## 2016-12-21 DIAGNOSIS — C50412 Malignant neoplasm of upper-outer quadrant of left female breast: Secondary | ICD-10-CM

## 2016-12-21 NOTE — Telephone Encounter (Signed)
Called patient and discussed need for 24 hour urine test, voiced understanding.

## 2016-12-21 NOTE — Progress Notes (Unsigned)
[id number]

## 2016-12-22 ENCOUNTER — Ambulatory Visit (INDEPENDENT_AMBULATORY_CARE_PROVIDER_SITE_OTHER): Payer: PPO | Admitting: *Deleted

## 2016-12-22 ENCOUNTER — Inpatient Hospital Stay: Payer: PPO

## 2016-12-22 DIAGNOSIS — R222 Localized swelling, mass and lump, trunk: Secondary | ICD-10-CM

## 2016-12-22 NOTE — Patient Instructions (Signed)
The patient is aware to call back for any questions or concerns.  

## 2016-12-22 NOTE — Progress Notes (Signed)
Patient ID: Bianca Shaw, female   DOB: July 31, 1945, 71 y.o.   MRN: [id number]  Patient came in today for a wound check/suture removal.  The wound is clean, with no signs of infection noted. Follow up as scheduled. Aware of pathology

## 2016-12-25 DIAGNOSIS — C50412 Malignant neoplasm of upper-outer quadrant of left female breast: Secondary | ICD-10-CM | POA: Diagnosis not present

## 2016-12-29 ENCOUNTER — Other Ambulatory Visit: Payer: Self-pay

## 2016-12-29 DIAGNOSIS — C50412 Malignant neoplasm of upper-outer quadrant of left female breast: Secondary | ICD-10-CM

## 2016-12-31 LAB — IFE+PROTEIN ELECTRO, 24-HR UR
% BETA, Urine: 10.1 %
ALPHA 1 URINE: 3.2 %
Albumin, U: 71.7 %
Alpha 2, Urine: 5 %
GAMMA GLOBULIN URINE: 10 %
Total Protein, Urine-Ur/day: 407 mg/24 hr — ABNORMAL HIGH (ref 30–150)
Total Protein, Urine: 22 mg/dL
Total Volume: 1850

## 2017-01-20 ENCOUNTER — Other Ambulatory Visit: Payer: Self-pay | Admitting: Family Medicine

## 2017-02-09 ENCOUNTER — Other Ambulatory Visit: Payer: Self-pay | Admitting: *Deleted

## 2017-02-09 ENCOUNTER — Telehealth: Payer: Self-pay | Admitting: *Deleted

## 2017-02-09 DIAGNOSIS — I10 Essential (primary) hypertension: Secondary | ICD-10-CM | POA: Diagnosis not present

## 2017-02-09 DIAGNOSIS — N183 Chronic kidney disease, stage 3 (moderate): Secondary | ICD-10-CM | POA: Diagnosis not present

## 2017-02-09 DIAGNOSIS — R809 Proteinuria, unspecified: Secondary | ICD-10-CM | POA: Diagnosis not present

## 2017-02-09 DIAGNOSIS — E871 Hypo-osmolality and hyponatremia: Secondary | ICD-10-CM | POA: Diagnosis not present

## 2017-02-09 NOTE — Telephone Encounter (Signed)
Patient called asking for results from 9/4  Dx:  Malignant neoplasm of upper-outer qua...   Ref Range & Units 7mo ago  Total Protein, Urine Not Estab. mg/dL 22.0   Total Protein, Urine-Ur/day 30 - 150 mg/24 hr 407    Albumin, U % 71.7   ALPHA 1 URINE % 3.2   Alpha 2, Urine % 5.0   % BETA, Urine % 10.1   GAMMA GLOBULIN URINE % 10.0   M-SPIKE %, Urine Not Observed % Not Observed

## 2017-02-09 NOTE — Telephone Encounter (Signed)
  OK to call patient with results.  M

## 2017-02-10 NOTE — Telephone Encounter (Signed)
Phone call returned to patient, her husband states that her insurance did not pay for the other urine that was done and that it cost over $1000 for that test and he wants to be sure that this new urine test is going to help diagnose what is wrong with her immune system. I have called Brandi who is going to check with Hulda Humphrey to see if the test was coded properly so that we can get insurance to cover it. She will get back to me

## 2017-02-10 NOTE — Telephone Encounter (Signed)
Can you call her? UPEP showed a moderate amount of protein, with no whole antibodies. We unfortunately did not get her light chains. If patient is willing, Dr. Mike Gip would like to repeat the 24 hour urine. If she agrees, let me know and I will put the orders in.

## 2017-03-03 ENCOUNTER — Other Ambulatory Visit: Payer: Self-pay | Admitting: Family Medicine

## 2017-03-29 ENCOUNTER — Ambulatory Visit: Payer: PPO | Admitting: Family Medicine

## 2017-03-29 ENCOUNTER — Encounter: Payer: Self-pay | Admitting: Family Medicine

## 2017-03-29 VITALS — BP 152/82 | HR 58 | Temp 97.5°F | Resp 16 | Wt 154.0 lb

## 2017-03-29 DIAGNOSIS — E7800 Pure hypercholesterolemia, unspecified: Secondary | ICD-10-CM

## 2017-03-29 DIAGNOSIS — Z1159 Encounter for screening for other viral diseases: Secondary | ICD-10-CM

## 2017-03-29 DIAGNOSIS — I10 Essential (primary) hypertension: Secondary | ICD-10-CM

## 2017-03-29 DIAGNOSIS — F419 Anxiety disorder, unspecified: Secondary | ICD-10-CM | POA: Diagnosis not present

## 2017-03-29 LAB — CBC WITH DIFFERENTIAL/PLATELET
BASOS PCT: 1.2 %
Basophils Absolute: 60 cells/uL (ref 0–200)
EOS PCT: 2.4 %
Eosinophils Absolute: 120 cells/uL (ref 15–500)
HEMATOCRIT: 31.9 % — AB (ref 35.0–45.0)
HEMOGLOBIN: 10.9 g/dL — AB (ref 11.7–15.5)
LYMPHS ABS: 1005 {cells}/uL (ref 850–3900)
MCH: 33.6 pg — ABNORMAL HIGH (ref 27.0–33.0)
MCHC: 34.2 g/dL (ref 32.0–36.0)
MCV: 98.5 fL (ref 80.0–100.0)
MONOS PCT: 13.9 %
MPV: 9.6 fL (ref 7.5–12.5)
Neutro Abs: 3120 cells/uL (ref 1500–7800)
Neutrophils Relative %: 62.4 %
Platelets: 284 10*3/uL (ref 140–400)
RBC: 3.24 10*6/uL — AB (ref 3.80–5.10)
RDW: 12.3 % (ref 11.0–15.0)
Total Lymphocyte: 20.1 %
WBC mixed population: 695 cells/uL (ref 200–950)
WBC: 5 10*3/uL (ref 3.8–10.8)

## 2017-03-29 LAB — TSH: TSH: 0.85 mIU/L (ref 0.40–4.50)

## 2017-03-29 LAB — COMPLETE METABOLIC PANEL WITH GFR
AG Ratio: 1.1 (calc) (ref 1.0–2.5)
ALKALINE PHOSPHATASE (APISO): 44 U/L (ref 33–130)
ALT: 11 U/L (ref 6–29)
AST: 19 U/L (ref 10–35)
Albumin: 4.1 g/dL (ref 3.6–5.1)
BUN/Creatinine Ratio: 10 (calc) (ref 6–22)
BUN: 12 mg/dL (ref 7–25)
CALCIUM: 9.3 mg/dL (ref 8.6–10.4)
CO2: 26 mmol/L (ref 20–32)
CREATININE: 1.16 mg/dL — AB (ref 0.60–0.93)
Chloride: 95 mmol/L — ABNORMAL LOW (ref 98–110)
GFR, EST NON AFRICAN AMERICAN: 47 mL/min/{1.73_m2} — AB (ref >=60)
GFR, Est African American: 54 mL/min/{1.73_m2} — ABNORMAL LOW (ref >=60)
GLUCOSE: 90 mg/dL (ref 65–99)
Globulin: 3.7 g/dL (calc) (ref 1.9–3.7)
Potassium: 4.5 mmol/L (ref 3.5–5.3)
SODIUM: 129 mmol/L — AB (ref 135–146)
Total Bilirubin: 0.4 mg/dL (ref 0.2–1.2)
Total Protein: 7.8 g/dL (ref 6.1–8.1)

## 2017-03-29 LAB — HEPATITIS C ANTIBODY
Hepatitis C Ab: NONREACTIVE
SIGNAL TO CUT-OFF: 0.02 (ref <1.00)

## 2017-03-29 LAB — LIPID PANEL
CHOL/HDL RATIO: 3.7 (calc) (ref <5.0)
CHOLESTEROL: 204 mg/dL — AB (ref <200)
HDL: 55 mg/dL (ref >50)
LDL Cholesterol (Calc): 122 mg/dL (calc) — ABNORMAL HIGH
NON-HDL CHOLESTEROL (CALC): 149 mg/dL — AB (ref <130)
TRIGLYCERIDES: 155 mg/dL — AB (ref <150)

## 2017-03-29 MED ORDER — HYDROCHLOROTHIAZIDE 25 MG PO TABS: 25.0000 mg | ORAL_TABLET | Freq: Every day | ORAL | 3 refills | Status: DC

## 2017-03-29 NOTE — Progress Notes (Signed)
Patient: Bianca Shaw Female    DOB: 02-Mar-1946   71 y.o.   MRN: [id number] Visit Date: 03/29/2017  Today's Provider: Wilhemena Durie, MD   Chief Complaint  Patient presents with  . Hypertension  . Anxiety   Subjective:    HPI  Hypertension, follow-up:  BP Readings from Last 3 Encounters:  03/29/17 (!) 152/82  12/15/16 122/64  12/08/16 133/71    She was last seen for hypertension 6 months ago.  BP at that visit was 122/64. Management since that visit includes none. She reports good compliance with treatment. She is not having side effects.  She is exercising. She is adherent to low salt diet.   Outside blood pressures are running 150's/80's.   Patient denies chest pain, chest pressure/discomfort, claudication, dyspnea, exertional chest pressure/discomfort, fatigue, irregular heart beat, lower extremity edema, near-syncope, orthopnea, palpitations, paroxysmal nocturnal dyspnea, syncope and tachypnea.   Cardiovascular risk factors include advanced age (older than 33 for men, 22 for women), dyslipidemia and hypertension.   Wt Readings from Last 3 Encounters:  03/29/17 154 lb (69.9 kg)  12/15/16 150 lb (68 kg)  12/08/16 149 lb 2 oz (67.6 kg)   ------------------------------------------------------------------------  Pt quit smoking in 2000.    Allergies  Allergen Reactions  . Diuretic  [Buchu-Cornsilk-Ch Grass-Hydran]     hyponatremia  . Furosemide     hives  . Other     SSRI---hyponatremia  . Pimenta Nausea And Vomiting     Current Outpatient Medications:  .  albuterol (PROVENTIL) (2.5 MG/3ML) 0.083% nebulizer solution, Take 3 mLs (2.5 mg total) by nebulization every 6 (six) hours as needed for wheezing or shortness of breath., Disp: 75 mL, Rfl: 12 .  ALPRAZolam (XANAX) 0.5 MG tablet, 1/2 tablet twice daily as needed, Disp: 30 tablet, Rfl: 5 .  amLODipine-olmesartan (AZOR) 10-40 MG tablet, Take 1 tablet by mouth as needed., Disp: 30 tablet,  Rfl: 12 .  aspirin 81 MG tablet, Take 81 mg by mouth daily. , Disp: , Rfl:  .  busPIRone (BUSPAR) 5 MG tablet, 1 Tablet, Oral QHS, Disp: 30 tablet, Rfl: 12 .  fluticasone (FLONASE) 50 MCG/ACT nasal spray, Place 2 sprays into the nose as needed. , Disp: , Rfl:  .  metoprolol succinate (TOPROL-XL) 25 MG 24 hr tablet, Take 1 tablet (25 mg total) by mouth daily. (Patient taking differently: Take 25 mg by mouth every morning. ), Disp: 30 tablet, Rfl: 12 .  nitroGLYCERIN (NITROSTAT) 0.4 MG SL tablet, Place 1 tablet (0.4 mg total) under the tongue every 5 (five) minutes as needed for chest pain., Disp: 30 tablet, Rfl: 0 .  potassium chloride SA (K-DUR,KLOR-CON) 20 MEQ tablet, Take 1 tablet (20 mEq total) by mouth daily as needed., Disp: 90 tablet, Rfl: 3 .  tamoxifen (NOLVADEX) 20 MG tablet, Take 1 tablet (20 mg total) by mouth daily., Disp: 90 tablet, Rfl: 3 .  alendronate (FOSAMAX) 70 MG tablet, Take 70 mg by mouth once a week. , Disp: , Rfl:  .  isosorbide mononitrate (IMDUR) 30 MG 24 hr tablet, Take 1 tablet (30 mg total) by mouth daily. (Patient not taking: Reported on 03/29/2017), Disp: 30 tablet, Rfl: 0 .  omeprazole (PRILOSEC) 20 MG capsule, Take 20 mg by mouth as needed. , Disp: , Rfl:  .  simvastatin (ZOCOR) 40 MG tablet, Take 1 tablet (40 mg total) by mouth daily. (Patient not taking: Reported on 03/29/2017), Disp: 30 tablet, Rfl: 0  Review of  Systems  Constitutional: Negative.   HENT: Negative.   Eyes: Negative.   Respiratory: Negative.   Cardiovascular: Negative.   Gastrointestinal: Negative.   Endocrine: Negative.   Genitourinary: Negative.   Musculoskeletal: Negative.   Skin: Negative.   Allergic/Immunologic: Negative.   Neurological: Negative.   Hematological: Negative.   Psychiatric/Behavioral: Negative.     Social History   Tobacco Use  . Smoking status: Former Smoker    Packs/day: 0.75    Years: 40.00    Pack years: 30.00    Types: Cigarettes    Last attempt to quit:  11/07/2003    Years since quitting: 13.4  . Smokeless tobacco: Never Used  Substance Use Topics  . Alcohol use: Yes    Alcohol/week: 0.6 - 1.8 oz    Types: 1 - 3 Glasses of wine per week   Objective:   BP (!) 152/82 (BP Location: Left Arm, Patient Position: Sitting, Cuff Size: Normal)   Pulse (!) 58   Temp (!) 97.5 F (36.4 C) (Oral)   Resp 16   Wt 154 lb (69.9 kg)   SpO2 98%   BMI 30.08 kg/m  Vitals:   03/29/17 0935  BP: (!) 152/82  Pulse: (!) 58  Resp: 16  Temp: (!) 97.5 F (36.4 C)  TempSrc: Oral  SpO2: 98%  Weight: 154 lb (69.9 kg)     Physical Exam  Constitutional: She is oriented to person, place, and time. She appears well-developed and well-nourished.  HENT:  Head: Normocephalic and atraumatic.  Eyes: Conjunctivae and EOM are normal. Pupils are equal, round, and reactive to light.  Neck: Normal range of motion. Neck supple.  Cardiovascular: Normal rate, regular rhythm, normal heart sounds and intact distal pulses.  Pulmonary/Chest: Effort normal and breath sounds normal.  Abdominal: Soft.  Musculoskeletal: Normal range of motion.  Neurological: She is alert and oriented to person, place, and time. She has normal reflexes.  Skin: Skin is warm and dry.  Psychiatric: She has a normal mood and affect. Her behavior is normal. Judgment and thought content normal.        Assessment & Plan:     1. Essential (primary) hypertension Start HCTZ follow up in 1 month - hydrochlorothiazide (HYDRODIURIL) 25 MG tablet; Take 1 tablet (25 mg total) by mouth daily.  Dispense: 90 tablet; Refill: 3 - CBC with Differential/Platelet - TSH  2. Hypercholesteremia  - Lipid panel - Comprehensive metabolic panel  3. Anxiety   4. Need for hepatitis C screening test  - Hepatitis C Antibody 5.Breast Cancer    HPI, Exam, and A&P Transcribed under the direction and in the presence of Richard L. Cranford Mon, MD  Electronically Signed: Katina Dung, CMA  I have done the  exam and reviewed the above chart and it is accurate to the best of my knowledge. Development worker, community has been used in this note in any air is in the dictation or transcription are unintentional.  Wilhemena Durie, MD  [place]

## 2017-04-09 ENCOUNTER — Inpatient Hospital Stay (HOSPITAL_BASED_OUTPATIENT_CLINIC_OR_DEPARTMENT_OTHER): Payer: PPO | Admitting: Hematology and Oncology

## 2017-04-09 ENCOUNTER — Inpatient Hospital Stay: Payer: PPO | Attending: Hematology and Oncology

## 2017-04-09 ENCOUNTER — Other Ambulatory Visit: Payer: Self-pay | Admitting: Hematology and Oncology

## 2017-04-09 VITALS — BP 186/94 | HR 62 | Temp 97.0°F | Wt 149.2 lb

## 2017-04-09 DIAGNOSIS — Z170 Estrogen receptor positive status [ER+]: Secondary | ICD-10-CM | POA: Diagnosis not present

## 2017-04-09 DIAGNOSIS — Z87891 Personal history of nicotine dependence: Secondary | ICD-10-CM | POA: Diagnosis not present

## 2017-04-09 DIAGNOSIS — Z9012 Acquired absence of left breast and nipple: Secondary | ICD-10-CM | POA: Diagnosis not present

## 2017-04-09 DIAGNOSIS — F419 Anxiety disorder, unspecified: Secondary | ICD-10-CM | POA: Diagnosis not present

## 2017-04-09 DIAGNOSIS — M810 Age-related osteoporosis without current pathological fracture: Secondary | ICD-10-CM

## 2017-04-09 DIAGNOSIS — E538 Deficiency of other specified B group vitamins: Secondary | ICD-10-CM

## 2017-04-09 DIAGNOSIS — M349 Systemic sclerosis, unspecified: Secondary | ICD-10-CM | POA: Insufficient documentation

## 2017-04-09 DIAGNOSIS — Z90710 Acquired absence of both cervix and uterus: Secondary | ICD-10-CM | POA: Insufficient documentation

## 2017-04-09 DIAGNOSIS — D649 Anemia, unspecified: Secondary | ICD-10-CM

## 2017-04-09 DIAGNOSIS — C50412 Malignant neoplasm of upper-outer quadrant of left female breast: Secondary | ICD-10-CM

## 2017-04-09 DIAGNOSIS — Z7982 Long term (current) use of aspirin: Secondary | ICD-10-CM | POA: Diagnosis not present

## 2017-04-09 DIAGNOSIS — I10 Essential (primary) hypertension: Secondary | ICD-10-CM | POA: Diagnosis not present

## 2017-04-09 DIAGNOSIS — Z79810 Long term (current) use of selective estrogen receptor modulators (SERMs): Secondary | ICD-10-CM | POA: Insufficient documentation

## 2017-04-09 DIAGNOSIS — K219 Gastro-esophageal reflux disease without esophagitis: Secondary | ICD-10-CM | POA: Diagnosis not present

## 2017-04-09 DIAGNOSIS — Z79899 Other long term (current) drug therapy: Secondary | ICD-10-CM | POA: Insufficient documentation

## 2017-04-09 DIAGNOSIS — E871 Hypo-osmolality and hyponatremia: Secondary | ICD-10-CM | POA: Insufficient documentation

## 2017-04-09 LAB — CBC WITH DIFFERENTIAL/PLATELET
Basophils Absolute: 0.1 10*3/uL (ref 0–0.1)
Basophils Relative: 2 %
Eosinophils Absolute: 0.1 10*3/uL (ref 0–0.7)
Eosinophils Relative: 2 %
HCT: 30.8 % — ABNORMAL LOW (ref 35.0–47.0)
Hemoglobin: 10.5 g/dL — ABNORMAL LOW (ref 12.0–16.0)
Lymphocytes Relative: 26 %
Lymphs Abs: 1.2 10*3/uL (ref 1.0–3.6)
MCH: 34.3 pg — ABNORMAL HIGH (ref 26.0–34.0)
MCHC: 34.1 g/dL (ref 32.0–36.0)
MCV: 100.5 fL — ABNORMAL HIGH (ref 80.0–100.0)
Monocytes Absolute: 0.7 10*3/uL (ref 0.2–0.9)
Monocytes Relative: 14 %
Neutro Abs: 2.6 10*3/uL (ref 1.4–6.5)
Neutrophils Relative %: 56 %
Platelets: 270 10*3/uL (ref 150–440)
RBC: 3.06 MIL/uL — ABNORMAL LOW (ref 3.80–5.20)
RDW: 13 % (ref 11.5–14.5)
WBC: 4.7 10*3/uL (ref 3.6–11.0)

## 2017-04-09 LAB — COMPREHENSIVE METABOLIC PANEL
ALT: 16 U/L (ref 14–54)
AST: 25 U/L (ref 15–41)
Albumin: 3.9 g/dL (ref 3.5–5.0)
Alkaline Phosphatase: 40 U/L (ref 38–126)
Anion gap: 7 (ref 5–15)
BUN: 24 mg/dL — ABNORMAL HIGH (ref 6–20)
CO2: 24 mmol/L (ref 22–32)
Calcium: 9.2 mg/dL (ref 8.9–10.3)
Chloride: 95 mmol/L — ABNORMAL LOW (ref 101–111)
Creatinine, Ser: 1.09 mg/dL — ABNORMAL HIGH (ref 0.44–1.00)
GFR calc Af Amer: 57 mL/min — ABNORMAL LOW (ref >60)
GFR calc non Af Amer: 49 mL/min — ABNORMAL LOW (ref >60)
Glucose, Bld: 99 mg/dL (ref 65–99)
Potassium: 4.7 mmol/L (ref 3.5–5.1)
Sodium: 126 mmol/L — ABNORMAL LOW (ref 135–145)
Total Bilirubin: 0.4 mg/dL (ref 0.3–1.2)
Total Protein: 7.9 g/dL (ref 6.5–8.1)

## 2017-04-09 LAB — IRON AND TIBC
Iron: 85 ug/dL (ref 28–170)
Saturation Ratios: 25 % (ref 10.4–31.8)
TIBC: 335 ug/dL (ref 250–450)
UIBC: 250 ug/dL

## 2017-04-09 LAB — FERRITIN: Ferritin: 61 ng/mL (ref 11–307)

## 2017-04-09 LAB — FOLATE: Folate: 11.5 ng/mL (ref >5.9)

## 2017-04-09 NOTE — Progress Notes (Signed)
Patient offers no complaints today. 

## 2017-04-09 NOTE — Progress Notes (Signed)
[facility] day:  04/09/2017   Chief Complaint: Bianca Shaw is a 71 y.o. female with stage IIA left breast cancer who is seen for 4 month assessment on tamoxifen.  HPI:  The patient was last seen in the medical oncology clinic on 12/08/2016.  At that time, she denied any complaint.  Exam revealed a 4 mm nodule in the left axillae above the mastectomy incision.  She was referred to Dr. Bary Castilla.  She underwent excision biopsy on 12/15/2016.  Pathology revealed fat necrosis and no malignancy.  Hematocrit was 28.7 with a hemoglobin of 10.1 on 12/08/2016.  B12 was 299.  SPEP revealed no monoclonal protein.  Kappa free light chains were 55.0 (elevated), lambda free light chains were 22.6 with a ratio of 2.43 (0.26 - 1.65).    During the interim, patient has been doing well. She denies physical complaints. Patient verbalizes no breast concerns. She has not experienced any B symptoms or interval infections. Patient is eating well, however she has lost 5 pounds since earlier this month.   Patient started oral B12 supplementation "about a week ago".  She continues on tamoxifen with no perceived side effects.    Past Medical History:  Diagnosis Date  . Anemia   . Anxiety disorder   . Breast cancer of upper-outer quadrant of left female breast ([place]) 11/2015   pT2 pN0(i+).;ER+; PR +, her 2 neu not overexpressed.  Mastectomy, SLN, Mammoprint: Low risk.   . Cancer ([place]) 12/03/2015   left breast/ INVASIVE LOBULAR CARCINOMA.   . Cough    lingering, mild, finished Prednisone and anitbiotic 11/03/15  . Family history of adverse reaction to anesthesia    sister - PONV  . GERD (gastroesophageal reflux disease)   . H/O: hysterectomy   . Hypertension   . Osteopenia   . Osteoporosis   . Pericarditis    diagnonsed June, 2010, unclear etiology as of yer  . Personal history of tobacco use, presenting hazards to health 10/31/2015  . Scleroderma ([facility])    ONLY ON  SKIN-MILD  . UTI (lower urinary tract infection)   . Wears dentures    full upper    Past Surgical History:  Procedure Laterality Date  . BREAST BIOPSY Right 2012   core - neg  . BREAST BIOPSY Left 12/03/2015   INVASIVE LOBULAR CARCINOMA.   Marland Kitchen CATARACT EXTRACTION W/ INTRAOCULAR LENS IMPLANT Right   . COLONOSCOPY WITH PROPOFOL N/A 11/08/2015   Procedure: COLONOSCOPY WITH PROPOFOL;  Surgeon: Lucilla Lame, MD;  Location: [facility];  Service: Endoscopy;  Laterality: N/A;  . EVACUATION BREAST HEMATOMA Left 01/14/2016   Procedure: EVACUATION HEMATOMA BREAST;  Surgeon: Robert Bellow, MD;  Location: [facility];  Service: General;  Laterality: Left;  Marland Kitchen MASTECTOMY Left 2017   complete mastectomy  . MASTECTOMY W/ SENTINEL NODE BIOPSY Left 12/26/2015   Procedure: MASTECTOMY WITH SENTINEL LYMPH NODE BIOPSY;  Surgeon: Robert Bellow, MD;  Location: [facility];  Service: General;  Laterality: Left;  . RIGHT/LEFT HEART CATH AND CORONARY ANGIOGRAPHY N/A 07/22/2016   Procedure: Right/Left Heart Cath and Coronary Angiography;  Surgeon: Minna Merritts, MD;  Location: [facility];  Service: Cardiovascular;  Laterality: N/A;  . TUBAL LIGATION    . VESICOVAGINAL FISTULA CLOSURE W/ TAH      Family History  Problem Relation Age of Onset  . Heart failure Mother   . Epilepsy Mother   . COPD Father   .  Heart disease Father   . Anxiety disorder Sister   . Arthritis Brother   . Heart disease Brother   . Vaginal cancer Paternal Grandmother   . Heart attack Paternal Grandfather   . COPD Brother   . Kidney failure Brother   . COPD Brother   . Arthritis Sister   . Uterine cancer Unknown   . Diabetes Unknown   . Colon cancer Neg Hx   . Stomach cancer Neg Hx   . Breast cancer Neg Hx     Social History:  reports that she quit smoking about 13 years ago. Her smoking use included cigarettes. She has a 30.00 pack-year smoking history. she has never used smokeless tobacco. She reports that  she drinks about 0.6 - 1.8 oz of alcohol per week. She reports that she does not use drugs.  She has 5 brothers and 2 sisters.  She has 2 children who are alive and well.  She lives in [place] with her husband, Pat Patrick.  The patient is accompanied by her husband today.  Allergies:  Allergies  Allergen Reactions  . Diuretic  [Buchu-Cornsilk-Ch Grass-Hydran]     hyponatremia  . Furosemide     hives  . Other     SSRI---hyponatremia  . Pimenta Nausea And Vomiting    Current Medications: Current Outpatient Medications  Medication Sig Dispense Refill  . albuterol (PROVENTIL) (2.5 MG/3ML) 0.083% nebulizer solution Take 3 mLs (2.5 mg total) by nebulization every 6 (six) hours as needed for wheezing or shortness of breath. 75 mL 12  . ALPRAZolam (XANAX) 0.5 MG tablet 1/2 tablet twice daily as needed 30 tablet 5  . amLODipine-olmesartan (AZOR) 10-40 MG tablet Take 1 tablet by mouth as needed. 30 tablet 12  . aspirin 81 MG tablet Take 81 mg by mouth daily.     . busPIRone (BUSPAR) 5 MG tablet 1 Tablet, Oral QHS 30 tablet 12  . fluticasone (FLONASE) 50 MCG/ACT nasal spray Place 2 sprays into the nose as needed.     . metoprolol succinate (TOPROL-XL) 25 MG 24 hr tablet Take 1 tablet (25 mg total) by mouth daily. (Patient taking differently: Take 25 mg by mouth every morning. ) 30 tablet 12  . nitroGLYCERIN (NITROSTAT) 0.4 MG SL tablet Place 1 tablet (0.4 mg total) under the tongue every 5 (five) minutes as needed for chest pain. 30 tablet 0  . omeprazole (PRILOSEC) 20 MG capsule Take 20 mg by mouth as needed.     . potassium chloride SA (K-DUR,KLOR-CON) 20 MEQ tablet Take 1 tablet (20 mEq total) by mouth daily as needed. 90 tablet 3  . tamoxifen (NOLVADEX) 20 MG tablet Take 1 tablet (20 mg total) by mouth daily. 90 tablet 3  . alendronate (FOSAMAX) 70 MG tablet Take 70 mg by mouth once a week.     . hydrochlorothiazide (HYDRODIURIL) 25 MG tablet Take 1 tablet (25 mg total) by mouth daily. (Patient not  taking: Reported on 04/09/2017) 90 tablet 3  . isosorbide mononitrate (IMDUR) 30 MG 24 hr tablet Take 1 tablet (30 mg total) by mouth daily. (Patient not taking: Reported on 03/29/2017) 30 tablet 0  . simvastatin (ZOCOR) 40 MG tablet Take 1 tablet (40 mg total) by mouth daily. (Patient not taking: Reported on 03/29/2017) 30 tablet 0   No current facility-administered medications for this visit.     Review of Systems:  GENERAL:  Feels "pretty good".  No fevers or sweats.  Weight down 5 pounds. PERFORMANCE STATUS (ECOG): 0  HEENT:  No visual changes, runny nose, sore throat, mouth sores or tenderness. Lungs: No shortness of breath or cough.  No hemoptysis. Cardiac:  No chest pain, palpitations, orthopnea, or PND. GI:  Eating well.  No nausea, vomiting, diarrhea, constipation, melena or hematochezia.  Colonoscopy done 09/2015. GU:  No urgency, frequency, dysuria, or hematuria. Musculoskeletal:  Osteoporosis on Fosamax.  No back pain.  No joint pain.  No muscle tenderness. Extremities:  No pain or swelling. Skin:  Skin changes due to scleroderma.  s/p left mastectomy.  Area at mastectomy site of concern.  No rashes or skin changes. Neuro:  No headache, numbness or weakness, balance or coordination issues. Endocrine:  No diabetes, thyroid issues, hot flashes or night sweats. Psych:  No mood changes, depression or anxiety. Pain:  No focal pain. Review of systems:  All other systems reviewed and found to be negative.  Physical Exam:  Blood pressure (!) 186/94, pulse 62, temperature (!) 97 F (36.1 C), temperature source Tympanic, weight 149 lb 3 oz (67.7 kg). GENERAL:  Well developed, well nourished, woman sitting comfortably in the exam room in no acute distress. MENTAL STATUS:  Alert and oriented to person, place and time. HEAD:  Short gray hair.  Normocephalic, atraumatic, face symmetric, no Cushingoid features. EYES:  Blue eyes.  Pupils equal round and reactive to light and accomodation.  No  conjunctivitis or scleral icterus. ENT:  Oropharynx clear without lesion.  Tongue normal. Mucous membranes moist.  RESPIRATORY:  Clear to auscultation without rales, wheezes or rhonchi. CARDIOVASCULAR:  Regular rate and rhythm without murmur, rub or gallop. BREAST:  Right breast with superior fibrocystic changes.  No masses, skin changes or nipple discharge.  Left sided mastectomy site without masses, overlying erythema or induration. ABDOMEN:  Soft, non-tender, with active bowel sounds, and no hepatosplenomegaly.  No masses. SKIN:  No rashes, ulcers or lesions. EXTREMITIES: No edema, no skin discoloration or tenderness.  No palpable cords. LYMPH NODES: No palpable cervical, supraclavicular, axillary or inguinal adenopathy  NEUROLOGICAL: Unremarkable. PSYCH:  Appropriate.   Appointment on 04/09/2017  Component Date Value Ref Range Status  . Sodium 04/09/2017 126* 135 - 145 mmol/L Final  . Potassium 04/09/2017 4.7  3.5 - 5.1 mmol/L Final  . Chloride 04/09/2017 95* 101 - 111 mmol/L Final  . CO2 04/09/2017 24  22 - 32 mmol/L Final  . Glucose, Bld 04/09/2017 99  65 - 99 mg/dL Final  . BUN 04/09/2017 24* 6 - 20 mg/dL Final  . Creatinine, Ser 04/09/2017 1.09* 0.44 - 1.00 mg/dL Final  . Calcium 04/09/2017 9.2  8.9 - 10.3 mg/dL Final  . Total Protein 04/09/2017 7.9  6.5 - 8.1 g/dL Final  . Albumin 04/09/2017 3.9  3.5 - 5.0 g/dL Final  . AST 04/09/2017 25  15 - 41 U/L Final  . ALT 04/09/2017 16  14 - 54 U/L Final  . Alkaline Phosphatase 04/09/2017 40  38 - 126 U/L Final  . Total Bilirubin 04/09/2017 0.4  0.3 - 1.2 mg/dL Final  . GFR calc non Af Amer 04/09/2017 49* >60 mL/min Final  . GFR calc Af Amer 04/09/2017 57* >60 mL/min Final   Comment: (NOTE) The eGFR has been calculated using the CKD EPI equation. This calculation has not been validated in all clinical situations. eGFR's persistently <60 mL/min signify possible Chronic Kidney Disease.   . Anion gap 04/09/2017 7  5 - 15 Final  .  WBC 04/09/2017 4.7  3.6 - 11.0 K/uL Final  . RBC  04/09/2017 3.06* 3.80 - 5.20 MIL/uL Final  . Hemoglobin 04/09/2017 10.5* 12.0 - 16.0 g/dL Final  . HCT 04/09/2017 30.8* 35.0 - 47.0 % Final  . MCV 04/09/2017 100.5* 80.0 - 100.0 fL Final  . MCH 04/09/2017 34.3* 26.0 - 34.0 pg Final  . MCHC 04/09/2017 34.1  32.0 - 36.0 g/dL Final  . RDW 04/09/2017 13.0  11.5 - 14.5 % Final  . Platelets 04/09/2017 270  150 - 440 K/uL Final  . Neutrophils Relative % 04/09/2017 56  % Final  . Neutro Abs 04/09/2017 2.6  1.4 - 6.5 K/uL Final  . Lymphocytes Relative 04/09/2017 26  % Final  . Lymphs Abs 04/09/2017 1.2  1.0 - 3.6 K/uL Final  . Monocytes Relative 04/09/2017 14  % Final  . Monocytes Absolute 04/09/2017 0.7  0.2 - 0.9 K/uL Final  . Eosinophils Relative 04/09/2017 2  % Final  . Eosinophils Absolute 04/09/2017 0.1  0 - 0.7 K/uL Final  . Basophils Relative 04/09/2017 2  % Final  . Basophils Absolute 04/09/2017 0.1  0 - 0.1 K/uL Final    Assessment:  Bianca Shaw is a 71 y.o. female with stage IIA (T2N0) left breast cancer s/p mastectomy with sentinel lymph node biopsy on 12/26/2015.   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  Margins were negative.  Two sentinel lymph nodes were negative for macrometastasis, but with isolated tumor cells on IHC stains.  Three additional lymph nodes were positive for isolated tumor cells on IHC.  Tumor was ER positive (> 90%), PR positive (> 90%), and Her2/neu 2+ (eqivocal).  Her2/neu by FISH was negative.  Pathologic stage was pT2 pN0(i+).  MammaPrint testing revealed low risk luminal type A. There was a 97.8% probability of being disease free at 10 years with hormonal therapy.   Left mammogram and ultrasound on 11/21/2015 revealed a 4.2 x 2.4 x 2.6 cm irregular hypoechoic mass in the 1-2 o'clock position in the left breast 3 cm from the nipple.  Left axillary lymph nodes appeared normal.  Breast MRI on  12/25/2015 revealed a 6.2 cm area of abnormal enhancement in the upper outer quadrant of the left breast.  The right breast revealed no mass or abnormal enhancement.  Exam revealed a 4 mm nodule in the left axillae above the mastectomy incision.  Excision biopsy on 12/15/2016 revealed fat necrosis and no malignancy.  She began tamoxifen on 02/04/2016.  She is tolerating it well.  CA27.29 has been followed: 27.3 on 12/10/2015, 19.4 on 05/30/2016, 16.4 on 09/07/2016, 17.2 on 12/08/2016, and 21.3 on 04/09/2017.  Bone density on 11/05/2015 revealed osteoporosis with a T score of -2.9 in the AP spine L1-L2 and -1.9 in the left femoral neck.  She started Fosamax in 10/2015.  She is on calcium and vitamin D.  She has a normocytic anemia.  Hematocrit has drifted down from 32 since 11/2015.  Diet is good.  She had a colonoscopy in 09/2015.  She denies any melena, hematochezia, hematuria or vaginal bleeding.  She began oral B12 in early 03/2017.  Anemia work-up on 06/09/2016 revealed the following normal studies: ferritin (64), iron studies, folate, TSH.  B12 was 295 (low normal) with a normal MMA thus r/o B12 deficiency.  Retic was 1.2% (inappropriately low) on 09/07/2016.  Coombs was negative on 10/19/2016.    24 hour urine (IFE) on 12/28/2016 revealed no monoclonal protein. Total protein elevated at 407.  She has scleroderma.  She has skin  thickening at sites of abrasion.  She has chronic hyponatremia.  She is on fluid restriction.  Symptomatically, she denies any complaint.  Exam is stable .  Hematocrit is 30.8 with a hemoglobin of 10.5.  Plan: 1.  Labs today:  CBC with diff, CMP, CA27.29, ferritin, iron studies, MMA, folate, retic. 2.  Discuss interval excision biopsy of chest wall nodule at mastectomy site- no evidence of malignancy. 3.  Discuss improvement in anemia.  Await pending studies today.  Continue to observe without intervention. 4.  Continue tamoxifen. 5.  Continue Fosamax, calcium  and vitamin D. 6.  RTC in 4 months for MD assessment and labs (CBC with diff, CMP, CA27.29).   Honor Loh, NP  04/09/2017, 12:46 PM   I saw and evaluated the patient, participating in the key portions of the service and reviewing pertinent diagnostic studies and records.  I reviewed the nurse practitioner's note and agree with the findings and the plan.  The assessment and plan were discussed with the patient.  Several questions were asked by the patient and answered.   Nolon Stalls, MD 04/09/2017,12:46 PM

## 2017-04-10 LAB — CANCER ANTIGEN 27.29: CA 27.29: 21.3 U/mL (ref 0.0–38.6)

## 2017-04-11 ENCOUNTER — Encounter: Payer: Self-pay | Admitting: Hematology and Oncology

## 2017-04-13 ENCOUNTER — Other Ambulatory Visit: Payer: Self-pay | Admitting: Family Medicine

## 2017-04-14 LAB — METHYLMALONIC ACID, SERUM: Methylmalonic Acid, Quantitative: 245 nmol/L (ref 0–378)

## 2017-04-29 ENCOUNTER — Encounter: Payer: Self-pay | Admitting: Family Medicine

## 2017-04-29 ENCOUNTER — Ambulatory Visit (INDEPENDENT_AMBULATORY_CARE_PROVIDER_SITE_OTHER): Payer: PPO | Admitting: Family Medicine

## 2017-04-29 VITALS — BP 128/62 | HR 60 | Temp 97.7°F | Resp 16 | Wt 156.0 lb

## 2017-04-29 DIAGNOSIS — C50412 Malignant neoplasm of upper-outer quadrant of left female breast: Secondary | ICD-10-CM | POA: Diagnosis not present

## 2017-04-29 DIAGNOSIS — I10 Essential (primary) hypertension: Secondary | ICD-10-CM

## 2017-04-29 DIAGNOSIS — I25110 Atherosclerotic heart disease of native coronary artery with unstable angina pectoris: Secondary | ICD-10-CM | POA: Diagnosis not present

## 2017-04-29 MED ORDER — HYDRALAZINE HCL 25 MG PO TABS: 25.0000 mg | ORAL_TABLET | Freq: Two times a day (BID) | ORAL | 11 refills | Status: DC

## 2017-04-29 NOTE — Progress Notes (Signed)
Patient: Bianca Shaw Female    DOB: 12-13-1945   72 y.o.   MRN: [id number] Visit Date: 04/29/2017  Today's Provider: Wilhemena Durie, MD   Chief Complaint  Patient presents with  . Hypertension   Subjective:    HPI  Hypertension, follow-up:  BP Readings from Last 3 Encounters:  04/29/17 128/62  04/09/17 (!) 186/94  03/29/17 (!) 152/82    She was last seen for hypertension 1 months ago.  BP at that visit was 186/94. Management since that visit includes started HCTZ. She reports good compliance with treatment. She is not having side effects. However pt reports that her kidney doctor told her that she did not need to urinate too much because her sodium gets depleted.  She is exercising. She is adherent to low salt diet.   Outside blood pressures are 140's/70's.  Patient denies chest pain, chest pressure/discomfort, claudication, dyspnea, exertional chest pressure/discomfort, fatigue, irregular heart beat, lower extremity edema, near-syncope, orthopnea, palpitations, paroxysmal nocturnal dyspnea, syncope and tachypnea.    Wt Readings from Last 3 Encounters:  04/29/17 156 lb (70.8 kg)  04/09/17 149 lb 3 oz (67.7 kg)  03/29/17 154 lb (69.9 kg)    ------------------------------------------------------------------------      Allergies  Allergen Reactions  . Diuretic  [Buchu-Cornsilk-Ch Grass-Hydran]     hyponatremia  . Furosemide     hives  . Other     SSRI---hyponatremia  . Pimenta Nausea And Vomiting     Current Outpatient Medications:  .  albuterol (PROVENTIL) (2.5 MG/3ML) 0.083% nebulizer solution, Take 3 mLs (2.5 mg total) by nebulization every 6 (six) hours as needed for wheezing or shortness of breath., Disp: 75 mL, Rfl: 12 .  alendronate (FOSAMAX) 70 MG tablet, Take 70 mg by mouth once a week. , Disp: , Rfl:  .  ALPRAZolam (XANAX) 0.5 MG tablet, 1/2 tablet twice daily as needed, Disp: 30 tablet, Rfl: 5 .  amLODipine-olmesartan (AZOR) 10-40  MG tablet, Take 1 tablet by mouth as needed., Disp: 30 tablet, Rfl: 12 .  aspirin 81 MG tablet, Take 81 mg by mouth daily. , Disp: , Rfl:  .  busPIRone (BUSPAR) 5 MG tablet, 1 Tablet, Oral QHS, Disp: 30 tablet, Rfl: 12 .  fluticasone (FLONASE) 50 MCG/ACT nasal spray, Place 2 sprays into the nose as needed. , Disp: , Rfl:  .  hydrochlorothiazide (HYDRODIURIL) 25 MG tablet, Take 1 tablet (25 mg total) by mouth daily., Disp: 90 tablet, Rfl: 3 .  isosorbide mononitrate (IMDUR) 30 MG 24 hr tablet, Take 1 tablet (30 mg total) by mouth daily., Disp: 30 tablet, Rfl: 0 .  metoprolol succinate (TOPROL-XL) 25 MG 24 hr tablet, Take 1 tablet (25 mg total) by mouth daily., Disp: 30 tablet, Rfl: 5 .  nitroGLYCERIN (NITROSTAT) 0.4 MG SL tablet, Place 1 tablet (0.4 mg total) under the tongue every 5 (five) minutes as needed for chest pain., Disp: 30 tablet, Rfl: 0 .  omeprazole (PRILOSEC) 20 MG capsule, Take 20 mg by mouth as needed. , Disp: , Rfl:  .  potassium chloride SA (K-DUR,KLOR-CON) 20 MEQ tablet, Take 1 tablet (20 mEq total) by mouth daily as needed., Disp: 90 tablet, Rfl: 3 .  tamoxifen (NOLVADEX) 20 MG tablet, Take 1 tablet (20 mg total) by mouth daily., Disp: 90 tablet, Rfl: 3 .  simvastatin (ZOCOR) 40 MG tablet, Take 1 tablet (40 mg total) by mouth daily. (Patient not taking: Reported on 04/29/2017), Disp: 30 tablet, Rfl: 0  Review of Systems  Constitutional: Positive for fatigue.  HENT: Negative.   Eyes: Negative.   Respiratory: Negative.   Cardiovascular: Negative.   Gastrointestinal: Negative.   Endocrine: Negative.   Genitourinary: Negative.   Musculoskeletal: Negative.   Skin: Negative.   Allergic/Immunologic: Negative.   Neurological: Negative.   Hematological: Negative.   Psychiatric/Behavioral: Negative.     Social History   Tobacco Use  . Smoking status: Former Smoker    Packs/day: 0.75    Years: 40.00    Pack years: 30.00    Types: Cigarettes    Last attempt to quit:  11/07/2003    Years since quitting: 13.4  . Smokeless tobacco: Never Used  Substance Use Topics  . Alcohol use: Yes    Alcohol/week: 0.6 - 1.8 oz    Types: 1 - 3 Glasses of wine per week   Objective:   BP 128/62 (BP Location: Left Arm, Patient Position: Sitting, Cuff Size: Normal)   Pulse 60   Temp 97.7 F (36.5 C) (Oral)   Resp 16   Wt 156 lb (70.8 kg)   BMI 30.47 kg/m  Vitals:   04/29/17 1055  BP: 128/62  Pulse: 60  Resp: 16  Temp: 97.7 F (36.5 C)  TempSrc: Oral  Weight: 156 lb (70.8 kg)     Physical Exam  Constitutional: She is oriented to person, place, and time. She appears well-developed and well-nourished.  HENT:  Head: Normocephalic and atraumatic.  Eyes: Conjunctivae and EOM are normal. Pupils are equal, round, and reactive to light. No scleral icterus.  Neck: Normal range of motion. Neck supple. No thyromegaly present.  Cardiovascular: Normal rate, regular rhythm and intact distal pulses.  Murmur (2/6 systolic murmur at the left lower sternal boarder) heard. Pulmonary/Chest: Effort normal and breath sounds normal.  Abdominal: Soft.  Musculoskeletal: Normal range of motion.  Neurological: She is alert and oriented to person, place, and time. She has normal reflexes.  Skin: Skin is warm and dry.  Psychiatric: She has a normal mood and affect. Her behavior is normal. Judgment and thought content normal.        Assessment & Plan:     1. Essential (primary) hypertension Stop HCTZ and start Hydralazine. Follow up in 1 month - hydrALAZINE (APRESOLINE) 25 MG tablet; Take 1 tablet (25 mg total) by mouth 2 (two) times daily.  Dispense: 60 tablet; Refill: 11 2.Breast Cancer     HPI, Exam, and A&P Transcribed under the direction and in the presence of Darrill Vreeland L. Cranford Mon, MD  Electronically Signed: Katina Dung, CMA  I have done the exam and reviewed the above chart and it is accurate to the best of my knowledge. Development worker, community has been used in this  note in any air is in the dictation or transcription are unintentional.  Wilhemena Durie, MD  [place]

## 2017-05-24 ENCOUNTER — Other Ambulatory Visit: Payer: Self-pay

## 2017-05-24 ENCOUNTER — Ambulatory Visit (INDEPENDENT_AMBULATORY_CARE_PROVIDER_SITE_OTHER): Payer: PPO | Admitting: Family Medicine

## 2017-05-24 ENCOUNTER — Ambulatory Visit
Admission: RE | Admit: 2017-05-24 | Discharge: 2017-05-24 | Disposition: A | Discharge status: Home / Self Care | Payer: PPO | Source: Ambulatory Visit | Attending: Family Medicine | Admitting: Family Medicine

## 2017-05-24 VITALS — BP 108/64 | HR 70 | Temp 97.8°F | Resp 18 | Wt 152.0 lb

## 2017-05-24 DIAGNOSIS — M791 Myalgia, unspecified site: Secondary | ICD-10-CM

## 2017-05-24 DIAGNOSIS — R531 Weakness: Secondary | ICD-10-CM

## 2017-05-24 DIAGNOSIS — I10 Essential (primary) hypertension: Secondary | ICD-10-CM | POA: Diagnosis not present

## 2017-05-24 DIAGNOSIS — R509 Fever, unspecified: Secondary | ICD-10-CM | POA: Diagnosis not present

## 2017-05-24 LAB — POCT INFLUENZA A/B
Influenza A, POC: NEGATIVE
Influenza B, POC: NEGATIVE

## 2017-05-24 MED ORDER — METOPROLOL SUCCINATE ER 25 MG PO TB24: 25.0000 mg | ORAL_TABLET | Freq: Every day | ORAL | 3 refills | Status: DC

## 2017-05-24 NOTE — Progress Notes (Signed)
Bianca Shaw  MRN: [id number] DOB: 1945-05-07  Subjective:  HPI   The patient is a 72 year old female who presents for evaluation of elevated fevers and blood pressure.   She started 1 week ago having a temperature of 105 and her blood pressure was 212/95.  Her husband gave her an Azor that they had at home.  About an hour later her blood pressure was 195/100 and she started having vomiting.   The patient went to bed and the next morning her temp was 102 and BP was 168/86.  She continued having nausea, fever and elevated blood pressure throughout the week.    On Friday she had wheezing and they started using the nebulizer.  She is starting to feel better today.  The interview today was very confusing and the patient's husband said that several of her medicine were stopped by other doctors.  Then he would say that she ran out and no one gave her refills.    Her husband gave her some of his Lisinopril as well because she didn't have the Metoprolol.     Patient Active Problem List   Diagnosis Date Noted  . Low vitamin B12 level 04/09/2017  . Mass of left chest wall 12/15/2016  . Elevated troponin 07/16/2016  . Non-ST elevation (NSTEMI) myocardial infarction ([place]) 07/16/2016  . Hypokalemia 07/16/2016  . Gastroenteritis 07/16/2016  . Dyspnea   . Coronary artery disease involving native coronary artery of native heart with unstable angina pectoris ([place])   . Hematoma (nontraumatic) of breast 01/13/2016  . Scleroderma ([place]) 12/12/2015  . Breast cancer of upper-outer quadrant of left female breast ([place]) 12/06/2015  . Special screening for malignant neoplasms, colon   . Personal history of tobacco use, presenting hazards to health 10/31/2015  . Anxiety 05/23/2015  . CAFL (chronic airflow limitation) ([place]) 05/23/2015  . Bloodgood disease 05/23/2015  . Essential (primary) hypertension 05/23/2015  . Acid reflux 05/23/2015  . Hypercholesteremia 05/23/2015  . Hyponatremia 05/23/2015    . Osteoporosis 05/23/2015  . Allergic rhinitis, seasonal 05/23/2015  . UNSPECIFIED ANEMIA 10/03/2008    Past Medical History:  Diagnosis Date  . Anemia   . Anxiety disorder   . Breast cancer of upper-outer quadrant of left female breast ([place]) 11/2015   pT2 pN0(i+).;ER+; PR +, her 2 neu not overexpressed.  Mastectomy, SLN, Mammoprint: Low risk.   . Cancer ([place]) 12/03/2015   left breast/ INVASIVE LOBULAR CARCINOMA.   . Cough    lingering, mild, finished Prednisone and anitbiotic 11/03/15  . Family history of adverse reaction to anesthesia    sister - PONV  . GERD (gastroesophageal reflux disease)   . H/O: hysterectomy   . Hypertension   . Osteopenia   . Osteoporosis   . Pericarditis    diagnonsed June, 2010, unclear etiology as of yer  . Personal history of tobacco use, presenting hazards to health 10/31/2015  . Scleroderma ([facility])    ONLY ON SKIN-MILD  . UTI (lower urinary tract infection)   . Wears dentures    full upper    Social History   Socioeconomic History  . Marital status: Married    Spouse name: Not on file  . Number of children: 2  . Years of education: Not on file  . Highest education level: Not on file  Social Needs  . Financial resource strain: Not on file  . Food insecurity - worry: Not on file  . Food insecurity - inability: Not on file  .  Transportation needs - medical: Not on file  . Transportation needs - non-medical: Not on file  Occupational History  . Not on file  Tobacco Use  . Smoking status: Former Smoker    Packs/day: 0.75    Years: 40.00    Pack years: 30.00    Types: Cigarettes    Last attempt to quit: 11/07/2003    Years since quitting: 13.5  . Smokeless tobacco: Never Used  Substance and Sexual Activity  . Alcohol use: Yes    Alcohol/week: 0.6 - 1.8 oz    Types: 1 - 3 Glasses of wine per week  . Drug use: No  . Sexual activity: Not on file  Other Topics Concern  . Not on file  Social History Narrative   She drinks 1 to 2  caffeinated beverages a day    Outpatient Encounter Medications as of 05/24/2017  Medication Sig Note  . albuterol (PROVENTIL) (2.5 MG/3ML) 0.083% nebulizer solution Take 3 mLs (2.5 mg total) by nebulization every 6 (six) hours as needed for wheezing or shortness of breath.   . ALPRAZolam (XANAX) 0.5 MG tablet 1/2 tablet twice daily as needed   . amLODipine-olmesartan (AZOR) 10-40 MG tablet Take 1 tablet by mouth as needed.   Marland Kitchen aspirin 81 MG tablet Take 81 mg by mouth daily.    . busPIRone (BUSPAR) 5 MG tablet 1 Tablet, Oral QHS   . cyanocobalamin 2000 MCG tablet Take 2,000 mcg by mouth daily.   . fluticasone (FLONASE) 50 MCG/ACT nasal spray Place 2 sprays into the nose as needed.    . hydrALAZINE (APRESOLINE) 25 MG tablet Take 1 tablet (25 mg total) by mouth 2 (two) times daily.   . nitroGLYCERIN (NITROSTAT) 0.4 MG SL tablet Place 1 tablet (0.4 mg total) under the tongue every 5 (five) minutes as needed for chest pain. 04/09/2017: PRN  . omeprazole (PRILOSEC) 20 MG capsule Take 20 mg by mouth as needed.    . potassium chloride SA (K-DUR,KLOR-CON) 20 MEQ tablet Take 1 tablet (20 mEq total) by mouth daily as needed.   . tamoxifen (NOLVADEX) 20 MG tablet Take 1 tablet (20 mg total) by mouth daily.   Marland Kitchen alendronate (FOSAMAX) 70 MG tablet Take 70 mg by mouth once a week.    . hydrochlorothiazide (HYDRODIURIL) 25 MG tablet Take 1 tablet (25 mg total) by mouth daily. (Patient not taking: Reported on 05/24/2017)   . isosorbide mononitrate (IMDUR) 30 MG 24 hr tablet Take 1 tablet (30 mg total) by mouth daily. (Patient not taking: Reported on 05/24/2017)   . metoprolol succinate (TOPROL-XL) 25 MG 24 hr tablet Take 1 tablet (25 mg total) by mouth daily. (Patient not taking: Reported on 05/24/2017)   . simvastatin (ZOCOR) 40 MG tablet Take 1 tablet (40 mg total) by mouth daily. (Patient not taking: Reported on 04/29/2017)    No facility-administered encounter medications on file as of 05/24/2017.      Allergies  Allergen Reactions  . Diuretic  [Buchu-Cornsilk-Ch Grass-Hydran]     hyponatremia  . Furosemide     hives  . Other     SSRI---hyponatremia  . Pimenta Nausea And Vomiting    Review of Systems  Constitutional: Positive for fever and malaise/fatigue.  Eyes: Negative.   Respiratory: Positive for cough, shortness of breath and wheezing. Negative for hemoptysis.   Cardiovascular: Negative for chest pain, palpitations, orthopnea and leg swelling.  Gastrointestinal: Negative.   Skin: Negative.   Neurological: Positive for weakness.  Endo/Heme/Allergies: Negative.  Psychiatric/Behavioral: Negative.     Objective:  BP 108/64 (BP Location: Right Arm, Patient Position: Sitting, Cuff Size: Normal)   Pulse 70   Temp 97.8 F (36.6 C) (Oral)   Resp 18   Wt 152 lb (68.9 kg)   SpO2 97%   BMI 29.69 kg/m   Physical Exam  Constitutional: She is oriented to person, place, and time and well-developed, well-nourished, and in no distress.  HENT:  Head: Normocephalic and atraumatic.  Eyes: Conjunctivae are normal. No scleral icterus.  Neck: No thyromegaly present.  Cardiovascular: Normal rate, regular rhythm and normal heart sounds.  Pulmonary/Chest: Effort normal and breath sounds normal.  Abdominal: Soft.  Neurological: She is alert and oriented to person, place, and time. Gait normal. GCS score is 15.  Skin: Skin is warm and dry.  Psychiatric: Mood, memory, affect and judgment normal.    Assessment and Plan :  1. Fever, unspecified fever cause  - POCT Influenza A/B - CBC with Differential - Comprehensive Metabolic Panel (CMET) - CK (Creatine Kinase) - DG Chest 2 View; Future - DG Chest 2 View  2. Myalgia  - CBC with Differential - Comprehensive Metabolic Panel (CMET) - CK (Creatine Kinase) - Iron - Vitamin B12 - DG Chest 2 View; Future - DG Chest 2 View  3. Weakness  - CBC with Differential - Comprehensive Metabolic Panel (CMET) - CK (Creatine  Kinase) - Iron - Vitamin B12 - DG Chest 2 View; Future - DG Chest 2 View  4. Essential (primary) hypertension - CBC with Differential - Comprehensive Metabolic Panel (CMET) - metoprolol succinate (TOPROL-XL) 25 MG 24 hr tablet; Take 1 tablet (25 mg total) by mouth daily.  Dispense: 90 tablet; Refill: 3  I have done the exam and reviewed the chart and it is accurate to the best of my knowledge. Development worker, community has been used and  any errors in dictation or transcription are unintentional. Miguel Aschoff M.D. [facility]

## 2017-05-25 ENCOUNTER — Telehealth: Payer: Self-pay | Admitting: Family Medicine

## 2017-05-25 DIAGNOSIS — J40 Bronchitis, not specified as acute or chronic: Secondary | ICD-10-CM

## 2017-05-25 LAB — COMPREHENSIVE METABOLIC PANEL
ALK PHOS: 130 IU/L — AB (ref 39–117)
ALT: 89 IU/L — AB (ref 0–32)
AST: 79 IU/L — AB (ref 0–40)
Albumin/Globulin Ratio: 1 — ABNORMAL LOW (ref 1.2–2.2)
Albumin: 3.4 g/dL — ABNORMAL LOW (ref 3.5–4.8)
BUN/Creatinine Ratio: 17 (ref 12–28)
BUN: 25 mg/dL (ref 8–27)
Bilirubin Total: 0.3 mg/dL (ref 0.0–1.2)
CALCIUM: 9.7 mg/dL (ref 8.7–10.3)
CO2: 21 mmol/L (ref 20–29)
CREATININE: 1.48 mg/dL — AB (ref 0.57–1.00)
Chloride: 90 mmol/L — ABNORMAL LOW (ref 96–106)
GFR calc Af Amer: 40 mL/min/{1.73_m2} — ABNORMAL LOW (ref >59)
GFR, EST NON AFRICAN AMERICAN: 35 mL/min/{1.73_m2} — AB (ref >59)
GLUCOSE: 91 mg/dL (ref 65–99)
Globulin, Total: 3.3 g/dL (ref 1.5–4.5)
Potassium: 4 mmol/L (ref 3.5–5.2)
SODIUM: 127 mmol/L — AB (ref 134–144)
Total Protein: 6.7 g/dL (ref 6.0–8.5)

## 2017-05-25 LAB — CBC WITH DIFFERENTIAL/PLATELET
BASOS ABS: 0 10*3/uL (ref 0.0–0.2)
Basos: 1 %
EOS (ABSOLUTE): 0.6 10*3/uL — ABNORMAL HIGH (ref 0.0–0.4)
EOS: 7 %
Hematocrit: 30.3 % — ABNORMAL LOW (ref 34.0–46.6)
Hemoglobin: 10.7 g/dL — ABNORMAL LOW (ref 11.1–15.9)
Immature Grans (Abs): 0 10*3/uL (ref 0.0–0.1)
Immature Granulocytes: 0 %
LYMPHS ABS: 1 10*3/uL (ref 0.7–3.1)
Lymphs: 12 %
MCH: 33.4 pg — ABNORMAL HIGH (ref 26.6–33.0)
MCHC: 35.3 g/dL (ref 31.5–35.7)
MCV: 95 fL (ref 79–97)
MONOS ABS: 0.7 10*3/uL (ref 0.1–0.9)
Monocytes: 8 %
NEUTROS PCT: 72 %
Neutrophils Absolute: 6.2 10*3/uL (ref 1.4–7.0)
PLATELETS: 250 10*3/uL (ref 150–379)
RBC: 3.2 x10E6/uL — AB (ref 3.77–5.28)
RDW: 14 % (ref 12.3–15.4)
WBC: 8.6 10*3/uL (ref 3.4–10.8)

## 2017-05-25 LAB — IRON: IRON: 39 ug/dL (ref 27–139)

## 2017-05-25 LAB — CK: CK TOTAL: 188 U/L — AB (ref 24–173)

## 2017-05-25 LAB — VITAMIN B12: Vitamin B-12: 1585 pg/mL — ABNORMAL HIGH (ref 232–1245)

## 2017-05-25 NOTE — Telephone Encounter (Signed)
Dr Darnell Level Could you review and sign off on her labs and I will call about labs and X-ray at the same time. Thanks

## 2017-05-25 NOTE — Telephone Encounter (Signed)
Pt's husband wanting to know the results of the lab test.  States his wife is very sick and he needs to know what to do.

## 2017-05-26 ENCOUNTER — Encounter: Payer: Self-pay | Admitting: Family Medicine

## 2017-05-26 ENCOUNTER — Telehealth: Payer: Self-pay | Admitting: Family Medicine

## 2017-05-26 MED ORDER — AZITHROMYCIN 250 MG PO TABS: ORAL_TABLET | ORAL | 0 refills | Status: DC

## 2017-05-26 MED ORDER — PREDNISONE 10 MG (21) PO TBPK
ORAL_TABLET | ORAL | 0 refills | Status: DC
Start: 2017-05-26 — End: 2017-06-10

## 2017-05-26 NOTE — Telephone Encounter (Signed)
Pt's husband requesting a urine test to check and see if the patient has a kidney infection.  States the pt is still sick and wants to have more test run.  Pt's husband states he is getting her ready and wants to come bring a urine sample today.

## 2017-05-26 NOTE — Telephone Encounter (Signed)
Sent in zpak and prednisone per Dr. Marlan Palau verbal order.

## 2017-05-26 NOTE — Telephone Encounter (Signed)
See other message in chart. Sent in medications.

## 2017-05-26 NOTE — Telephone Encounter (Signed)
Pt advised of lab results and Chest xray. Patient states she is not much better. Her fever did stop as of yesterday-05/25/2017. She is still weak/fatigue, hard to get up and get going, she is hurting upper body to her knees. Dyspnea present but states she has COPD. No chest pain, no vomiting.  Wanted to let you know, if you have any suggestions for her?-Wisdom Rickey V Thelonious Kauffmann, RMA

## 2017-05-26 NOTE — Telephone Encounter (Signed)
done

## 2017-05-28 ENCOUNTER — Other Ambulatory Visit: Payer: Self-pay | Admitting: Emergency Medicine

## 2017-05-28 MED ORDER — SULFAMETHOXAZOLE-TRIMETHOPRIM 800-160 MG PO TABS: 1.0000 | ORAL_TABLET | Freq: Two times a day (BID) | ORAL | 0 refills | Status: DC

## 2017-05-28 NOTE — Progress Notes (Signed)
Per Dr. Rosanna Randy verbal order. Start Septra DS BID for 5 days And see Simona Huh for Saturday clinic or here on Monday. Per pt daughter pt is not better and is "talking like she is drunk". They did an in home urine test and showed up positive for infection.

## 2017-05-31 ENCOUNTER — Ambulatory Visit (INDEPENDENT_AMBULATORY_CARE_PROVIDER_SITE_OTHER): Payer: PPO | Admitting: Family Medicine

## 2017-05-31 ENCOUNTER — Encounter: Payer: Self-pay | Admitting: Family Medicine

## 2017-05-31 VITALS — BP 122/62 | HR 55 | Temp 97.8°F | Resp 16

## 2017-05-31 DIAGNOSIS — C50412 Malignant neoplasm of upper-outer quadrant of left female breast: Secondary | ICD-10-CM | POA: Diagnosis not present

## 2017-05-31 DIAGNOSIS — D649 Anemia, unspecified: Secondary | ICD-10-CM

## 2017-05-31 DIAGNOSIS — R531 Weakness: Secondary | ICD-10-CM | POA: Diagnosis not present

## 2017-05-31 DIAGNOSIS — N390 Urinary tract infection, site not specified: Secondary | ICD-10-CM

## 2017-05-31 DIAGNOSIS — R269 Unspecified abnormalities of gait and mobility: Secondary | ICD-10-CM

## 2017-05-31 DIAGNOSIS — I25110 Atherosclerotic heart disease of native coronary artery with unstable angina pectoris: Secondary | ICD-10-CM

## 2017-05-31 DIAGNOSIS — I10 Essential (primary) hypertension: Secondary | ICD-10-CM

## 2017-05-31 NOTE — Progress Notes (Deleted)
Patient: Bianca Shaw Female    DOB: 1945/11/24   72 y.o.   MRN: [id number] Visit Date: 05/31/2017  Today's Provider: Wilhemena Durie, MD   Chief Complaint  Patient presents with  . Follow-up   Subjective:    HPI Pt is here for a follow up. She is still not feeling well. She reports that she just feels weak. She reports that she feels weaker on her right side more than her left. She started treatment on Friday for a possible UTI ( Septra DS) and her fevers resolved. She reports that she is breathing better. She is also finished taking her prednisone. Husband thinks she is in kidney failure. She has an appt with kidney doctor tomorrow. She is nauseated. He is concerned about her blood counts as well.        Allergies  Allergen Reactions  . Diuretic  [Buchu-Cornsilk-Ch Grass-Hydran]     hyponatremia  . Furosemide     hives  . Other     SSRI---hyponatremia  . Pimenta Nausea And Vomiting     Current Outpatient Medications:  .  albuterol (PROVENTIL) (2.5 MG/3ML) 0.083% nebulizer solution, Take 3 mLs (2.5 mg total) by nebulization every 6 (six) hours as needed for wheezing or shortness of breath., Disp: 75 mL, Rfl: 12 .  alendronate (FOSAMAX) 70 MG tablet, Take 70 mg by mouth once a week. , Disp: , Rfl:  .  ALPRAZolam (XANAX) 0.5 MG tablet, 1/2 tablet twice daily as needed, Disp: 30 tablet, Rfl: 5 .  amLODipine-olmesartan (AZOR) 10-40 MG tablet, Take 1 tablet by mouth as needed., Disp: 30 tablet, Rfl: 12 .  aspirin 81 MG tablet, Take 81 mg by mouth daily. , Disp: , Rfl:  .  azithromycin (ZITHROMAX) 250 MG tablet, Take 2 tablets one the first day then take one daily until finished., Disp: 6 each, Rfl: 0 .  busPIRone (BUSPAR) 5 MG tablet, 1 Tablet, Oral QHS, Disp: 30 tablet, Rfl: 12 .  cyanocobalamin 2000 MCG tablet, Take 2,000 mcg by mouth daily., Disp: , Rfl:  .  fluticasone (FLONASE) 50 MCG/ACT nasal spray, Place 2 sprays into the nose as needed. , Disp: , Rfl:  .   hydrALAZINE (APRESOLINE) 25 MG tablet, Take 1 tablet (25 mg total) by mouth 2 (two) times daily., Disp: 60 tablet, Rfl: 11 .  metoprolol succinate (TOPROL-XL) 25 MG 24 hr tablet, Take 1 tablet (25 mg total) by mouth daily., Disp: 90 tablet, Rfl: 3 .  nitroGLYCERIN (NITROSTAT) 0.4 MG SL tablet, Place 1 tablet (0.4 mg total) under the tongue every 5 (five) minutes as needed for chest pain., Disp: 30 tablet, Rfl: 0 .  omeprazole (PRILOSEC) 20 MG capsule, Take 20 mg by mouth as needed. , Disp: , Rfl:  .  potassium chloride SA (K-DUR,KLOR-CON) 20 MEQ tablet, Take 1 tablet (20 mEq total) by mouth daily as needed., Disp: 90 tablet, Rfl: 3 .  predniSONE (STERAPRED UNI-PAK 21 TAB) 10 MG (21) TBPK tablet, Take 6 tablets on the first day then decrease by one until finished., Disp: 21 tablet, Rfl: 0 .  sulfamethoxazole-trimethoprim (BACTRIM DS,SEPTRA DS) 800-160 MG tablet, Take 1 tablet by mouth 2 (two) times daily., Disp: 10 tablet, Rfl: 0 .  tamoxifen (NOLVADEX) 20 MG tablet, Take 1 tablet (20 mg total) by mouth daily., Disp: 90 tablet, Rfl: 3 .  hydrochlorothiazide (HYDRODIURIL) 25 MG tablet, Take 1 tablet (25 mg total) by mouth daily. (Patient not taking: Reported on 05/24/2017),  Disp: 90 tablet, Rfl: 3 .  isosorbide mononitrate (IMDUR) 30 MG 24 hr tablet, Take 1 tablet (30 mg total) by mouth daily. (Patient not taking: Reported on 05/24/2017), Disp: 30 tablet, Rfl: 0 .  simvastatin (ZOCOR) 40 MG tablet, Take 1 tablet (40 mg total) by mouth daily. (Patient not taking: Reported on 04/29/2017), Disp: 30 tablet, Rfl: 0  Review of Systems  Constitutional: Positive for fatigue and fever.  HENT: Negative.   Eyes: Negative.   Respiratory: Positive for cough and shortness of breath.   Cardiovascular: Negative.   Gastrointestinal: Positive for nausea.  Endocrine: Negative.   Genitourinary: Negative.   Musculoskeletal: Negative.   Skin: Negative.   Neurological: Positive for weakness.  Hematological: Negative.     Psychiatric/Behavioral: Negative.     Social History   Tobacco Use  . Smoking status: Former Smoker    Packs/day: 0.75    Years: 40.00    Pack years: 30.00    Types: Cigarettes    Last attempt to quit: 11/07/2003    Years since quitting: 13.5  . Smokeless tobacco: Never Used  Substance Use Topics  . Alcohol use: Yes    Alcohol/week: 0.6 - 1.8 oz    Types: 1 - 3 Glasses of wine per week   Objective:   BP 122/62 (BP Location: Left Arm, Patient Position: Sitting, Cuff Size: Normal)   Pulse (!) 55   Temp 97.8 F (36.6 C) (Oral)   Resp 16   SpO2 96%  Vitals:   05/31/17 1045  BP: 122/62  Pulse: (!) 55  Resp: 16  Temp: 97.8 F (36.6 C)  TempSrc: Oral  SpO2: 96%     Physical Exam      Assessment & Plan:           Wilhemena Durie, MD  [facility]

## 2017-05-31 NOTE — Progress Notes (Signed)
Patient: Bianca Shaw Female    DOB: March 09, 1946   72 y.o.   MRN: [id number] Visit Date: 05/31/2017  Today's Provider: Wilhemena Durie, MD   Chief Complaint  Patient presents with  . Follow-up   Subjective:    HPI Pt is here for a follow up. She is still not feeling well. She reports that she just feels weak. She reports that she feels weaker on her right side than on her left. She started treatment on Friday for a possible UTI (septra DS) and her fevers resolved. She reports that she is breathing better. She will also finish taking her prednisone tomorrow. husband thinks she has kidney failure. She has an appt with kidney doctor tomorrow morning. She is nauseated. He is concerted about her blood counts as well.        Allergies  Allergen Reactions  . Diuretic  [Buchu-Cornsilk-Ch Grass-Hydran]     hyponatremia  . Furosemide     hives  . Other     SSRI---hyponatremia  . Pimenta Nausea And Vomiting     Current Outpatient Medications:  .  albuterol (PROVENTIL) (2.5 MG/3ML) 0.083% nebulizer solution, Take 3 mLs (2.5 mg total) by nebulization every 6 (six) hours as needed for wheezing or shortness of breath., Disp: 75 mL, Rfl: 12 .  alendronate (FOSAMAX) 70 MG tablet, Take 70 mg by mouth once a week. , Disp: , Rfl:  .  ALPRAZolam (XANAX) 0.5 MG tablet, 1/2 tablet twice daily as needed, Disp: 30 tablet, Rfl: 5 .  amLODipine-olmesartan (AZOR) 10-40 MG tablet, Take 1 tablet by mouth as needed., Disp: 30 tablet, Rfl: 12 .  aspirin 81 MG tablet, Take 81 mg by mouth daily. , Disp: , Rfl:  .  azithromycin (ZITHROMAX) 250 MG tablet, Take 2 tablets one the first day then take one daily until finished., Disp: 6 each, Rfl: 0 .  busPIRone (BUSPAR) 5 MG tablet, 1 Tablet, Oral QHS, Disp: 30 tablet, Rfl: 12 .  cyanocobalamin 2000 MCG tablet, Take 2,000 mcg by mouth daily., Disp: , Rfl:  .  fluticasone (FLONASE) 50 MCG/ACT nasal spray, Place 2 sprays into the nose as needed. , Disp:  , Rfl:  .  hydrALAZINE (APRESOLINE) 25 MG tablet, Take 1 tablet (25 mg total) by mouth 2 (two) times daily., Disp: 60 tablet, Rfl: 11 .  hydrochlorothiazide (HYDRODIURIL) 25 MG tablet, Take 1 tablet (25 mg total) by mouth daily., Disp: 90 tablet, Rfl: 3 .  metoprolol succinate (TOPROL-XL) 25 MG 24 hr tablet, Take 1 tablet (25 mg total) by mouth daily., Disp: 90 tablet, Rfl: 3 .  nitroGLYCERIN (NITROSTAT) 0.4 MG SL tablet, Place 1 tablet (0.4 mg total) under the tongue every 5 (five) minutes as needed for chest pain., Disp: 30 tablet, Rfl: 0 .  omeprazole (PRILOSEC) 20 MG capsule, Take 20 mg by mouth as needed. , Disp: , Rfl:  .  potassium chloride SA (K-DUR,KLOR-CON) 20 MEQ tablet, Take 1 tablet (20 mEq total) by mouth daily as needed., Disp: 90 tablet, Rfl: 3 .  predniSONE (STERAPRED UNI-PAK 21 TAB) 10 MG (21) TBPK tablet, Take 6 tablets on the first day then decrease by one until finished., Disp: 21 tablet, Rfl: 0 .  sulfamethoxazole-trimethoprim (BACTRIM DS,SEPTRA DS) 800-160 MG tablet, Take 1 tablet by mouth 2 (two) times daily., Disp: 10 tablet, Rfl: 0 .  tamoxifen (NOLVADEX) 20 MG tablet, Take 1 tablet (20 mg total) by mouth daily., Disp: 90 tablet, Rfl: 3 .  isosorbide mononitrate (IMDUR) 30 MG 24 hr tablet, Take 1 tablet (30 mg total) by mouth daily. (Patient not taking: Reported on 05/24/2017), Disp: 30 tablet, Rfl: 0 .  simvastatin (ZOCOR) 40 MG tablet, Take 1 tablet (40 mg total) by mouth daily. (Patient not taking: Reported on 04/29/2017), Disp: 30 tablet, Rfl: 0  Review of Systems  Constitutional: Positive for fatigue.  HENT: Negative.   Eyes: Negative.   Respiratory: Negative.   Cardiovascular: Negative.   Gastrointestinal: Negative.   Endocrine: Negative.   Genitourinary: Negative.   Allergic/Immunologic: Negative.   Neurological: Positive for weakness.  Psychiatric/Behavioral: Negative.     Social History   Tobacco Use  . Smoking status: Former Smoker    Packs/day: 0.75     Years: 40.00    Pack years: 30.00    Types: Cigarettes    Last attempt to quit: 11/07/2003    Years since quitting: 13.5  . Smokeless tobacco: Never Used  Substance Use Topics  . Alcohol use: Yes    Alcohol/week: 0.6 - 1.8 oz    Types: 1 - 3 Glasses of wine per week   Objective:   BP 122/62 (BP Location: Left Arm, Patient Position: Sitting, Cuff Size: Normal)   Pulse (!) 55   Temp 97.8 F (36.6 C) (Oral)   Resp 16   SpO2 96%  Vitals:   05/31/17 1045  BP: 122/62  Pulse: (!) 55  Resp: 16  Temp: 97.8 F (36.6 C)  TempSrc: Oral  SpO2: 96%     Physical Exam  Constitutional: She is oriented to person, place, and time. She appears well-developed and well-nourished.  HENT:  Head: Normocephalic and atraumatic.  Eyes: Conjunctivae and EOM are normal. Pupils are equal, round, and reactive to light.  Neck: Normal range of motion. Neck supple.  Cardiovascular: Normal rate, regular rhythm, normal heart sounds and intact distal pulses.  Pulmonary/Chest: Effort normal and breath sounds normal.  Abdominal: Soft. Bowel sounds are normal.  Musculoskeletal: Normal range of motion.  Neurological: She is alert and oriented to person, place, and time. She has normal reflexes.  Cog wheeling and shuffling gait with minimal arm swing.   Skin: Skin is warm and dry.  Psychiatric: She has a normal mood and affect. Her behavior is normal. Judgment and thought content normal.        Assessment & Plan:     1. Essential (primary) hypertension Stable.   2. Weakness Possible parkinson's disease,CKD,dehydration,recent viral illness (URI).Consider cardiomyopathy. - Ambulatory referral to Neurology  3. Abnormal gait/Possible Movement Disorder  - Ambulatory referral to Neurology  4. Urinary tract infection without hematuria, site unspecified Pt unable to get urine in the office today. Husband will bring specimen back by. If urine clear of infection. Stop Septra.  - POCT urinalysis  dipstick 5.CKD Pt sees Nephrology later today.   6.Anemia Probably of chronic disease colonicscopy 2017. Consider B12 level--repeat CBC on f/u.  HPI, Exam, and A&P Transcribed under the direction and in the presence of Richard L. Cranford Mon, MD  Electronically Signed: Katina Dung, Cameron Pollyann Glen   Wilhemena Durie, MD  [facility]

## 2017-06-01 ENCOUNTER — Telehealth: Payer: Self-pay | Admitting: Family Medicine

## 2017-06-01 ENCOUNTER — Other Ambulatory Visit: Payer: Self-pay

## 2017-06-01 ENCOUNTER — Ambulatory Visit
Admission: RE | Admit: 2017-06-01 | Discharge: 2017-06-01 | Disposition: A | Discharge status: Home / Self Care | Payer: PPO | Source: Ambulatory Visit | Attending: Family Medicine | Admitting: Family Medicine

## 2017-06-01 ENCOUNTER — Ambulatory Visit: Payer: Self-pay | Admitting: Family Medicine

## 2017-06-01 DIAGNOSIS — K148 Other diseases of tongue: Secondary | ICD-10-CM

## 2017-06-01 DIAGNOSIS — N183 Chronic kidney disease, stage 3 (moderate): Secondary | ICD-10-CM | POA: Diagnosis not present

## 2017-06-01 DIAGNOSIS — I10 Essential (primary) hypertension: Secondary | ICD-10-CM | POA: Diagnosis not present

## 2017-06-01 DIAGNOSIS — R809 Proteinuria, unspecified: Secondary | ICD-10-CM | POA: Diagnosis not present

## 2017-06-01 DIAGNOSIS — E860 Dehydration: Secondary | ICD-10-CM | POA: Insufficient documentation

## 2017-06-01 DIAGNOSIS — E871 Hypo-osmolality and hyponatremia: Secondary | ICD-10-CM | POA: Diagnosis not present

## 2017-06-01 MED ORDER — SODIUM CHLORIDE 0.9 % IV SOLN
INTRAVENOUS | Status: AC
  Administered 2017-06-01: 13:00:00 via INTRAVENOUS

## 2017-06-01 MED ORDER — NYSTATIN 100000 UNIT/ML MT SUSP: 5.0000 mL | Freq: Four times a day (QID) | OROMUCOSAL | 0 refills | Status: DC

## 2017-06-01 NOTE — Telephone Encounter (Signed)
Pt is in same day surgery for dehydration, Bianca Shaw states she is complaining of her mouth being very sore and there are white patches on her tonge.  Pt is requesting something be called into [facility].

## 2017-06-01 NOTE — Telephone Encounter (Signed)
Please review

## 2017-06-02 ENCOUNTER — Other Ambulatory Visit: Payer: Self-pay | Admitting: Nephrology

## 2017-06-02 ENCOUNTER — Telehealth: Payer: Self-pay

## 2017-06-02 DIAGNOSIS — R110 Nausea: Secondary | ICD-10-CM

## 2017-06-02 DIAGNOSIS — R918 Other nonspecific abnormal finding of lung field: Secondary | ICD-10-CM

## 2017-06-02 MED ORDER — CLOTRIMAZOLE 10 MG MT LOZG: 10.0000 mg | LOZENGE | Freq: Four times a day (QID) | OROMUCOSAL | 0 refills | Status: DC

## 2017-06-02 MED ORDER — ONDANSETRON HCL 4 MG PO TABS: 4.0000 mg | ORAL_TABLET | Freq: Three times a day (TID) | ORAL | 0 refills | Status: DC | PRN

## 2017-06-02 NOTE — Telephone Encounter (Signed)
Nystatin sus. Was not available sent in Mycelex troche instead

## 2017-06-02 NOTE — Telephone Encounter (Signed)
Dr. Rosanna Randy has left for the day, please advise. Thanks.

## 2017-06-02 NOTE — Telephone Encounter (Signed)
Zofran sent in

## 2017-06-02 NOTE — Telephone Encounter (Signed)
Pt advised.

## 2017-06-02 NOTE — Telephone Encounter (Signed)
Patient's husband is calling saying that the patient has severe nausea and is requesting that something be called into the pharmacy. He reports that yesterday at the hospital they prescribed promethazine 12.5mg  but it is not helping. They are requesting that something else be called in for nausea. Please advise. Thanks!

## 2017-06-04 ENCOUNTER — Emergency Department
Admission: EM | Admit: 2017-06-04 | Discharge: 2017-06-04 | Disposition: A | Discharge status: Home / Self Care | Payer: PPO | Attending: Emergency Medicine | Admitting: Emergency Medicine

## 2017-06-04 ENCOUNTER — Telehealth: Payer: Self-pay | Admitting: Family Medicine

## 2017-06-04 ENCOUNTER — Emergency Department: Payer: PPO

## 2017-06-04 DIAGNOSIS — N289 Disorder of kidney and ureter, unspecified: Secondary | ICD-10-CM | POA: Diagnosis not present

## 2017-06-04 DIAGNOSIS — R531 Weakness: Secondary | ICD-10-CM | POA: Diagnosis not present

## 2017-06-04 DIAGNOSIS — Z87891 Personal history of nicotine dependence: Secondary | ICD-10-CM | POA: Diagnosis not present

## 2017-06-04 DIAGNOSIS — E871 Hypo-osmolality and hyponatremia: Secondary | ICD-10-CM | POA: Insufficient documentation

## 2017-06-04 DIAGNOSIS — Z853 Personal history of malignant neoplasm of breast: Secondary | ICD-10-CM | POA: Diagnosis not present

## 2017-06-04 DIAGNOSIS — R05 Cough: Secondary | ICD-10-CM | POA: Diagnosis not present

## 2017-06-04 DIAGNOSIS — I252 Old myocardial infarction: Secondary | ICD-10-CM | POA: Diagnosis not present

## 2017-06-04 DIAGNOSIS — M791 Myalgia, unspecified site: Secondary | ICD-10-CM | POA: Diagnosis not present

## 2017-06-04 DIAGNOSIS — I10 Essential (primary) hypertension: Secondary | ICD-10-CM | POA: Insufficient documentation

## 2017-06-04 LAB — TSH: TSH: 1.55 u[IU]/mL (ref 0.350–4.500)

## 2017-06-04 LAB — TROPONIN I
TROPONIN I: 0.03 ng/mL — AB (ref <0.03)
Troponin I: 0.03 ng/mL (ref <0.03)

## 2017-06-04 LAB — URINALYSIS, COMPLETE (UACMP) WITH MICROSCOPIC
Bacteria, UA: NONE SEEN
Bilirubin Urine: NEGATIVE
Glucose, UA: NEGATIVE mg/dL
HGB URINE DIPSTICK: NEGATIVE
Ketones, ur: NEGATIVE mg/dL
LEUKOCYTES UA: NEGATIVE
Nitrite: NEGATIVE
PH: 5 (ref 5.0–8.0)
Protein, ur: NEGATIVE mg/dL
SPECIFIC GRAVITY, URINE: 1.021 (ref 1.005–1.030)

## 2017-06-04 LAB — BASIC METABOLIC PANEL
Anion gap: 10 (ref 5–15)
BUN: 21 mg/dL — AB (ref 6–20)
CALCIUM: 12.8 mg/dL — AB (ref 8.9–10.3)
CHLORIDE: 89 mmol/L — AB (ref 101–111)
CO2: 25 mmol/L (ref 22–32)
CREATININE: 1.28 mg/dL — AB (ref 0.44–1.00)
GFR, EST AFRICAN AMERICAN: 47 mL/min — AB (ref >60)
GFR, EST NON AFRICAN AMERICAN: 41 mL/min — AB (ref >60)
Glucose, Bld: 120 mg/dL — ABNORMAL HIGH (ref 65–99)
Potassium: 4.4 mmol/L (ref 3.5–5.1)
SODIUM: 124 mmol/L — AB (ref 135–145)

## 2017-06-04 LAB — CBC
HCT: 32.8 % — ABNORMAL LOW (ref 35.0–47.0)
Hemoglobin: 11.4 g/dL — ABNORMAL LOW (ref 12.0–16.0)
MCH: 33.9 pg (ref 26.0–34.0)
MCHC: 34.8 g/dL (ref 32.0–36.0)
MCV: 97.7 fL (ref 80.0–100.0)
PLATELETS: 295 10*3/uL (ref 150–440)
RBC: 3.36 MIL/uL — AB (ref 3.80–5.20)
RDW: 12.7 % (ref 11.5–14.5)
WBC: 5.6 10*3/uL (ref 3.6–11.0)

## 2017-06-04 MED ORDER — SODIUM CHLORIDE 0.9 % IV BOLUS (SEPSIS)
1000.0000 mL | Freq: Once | INTRAVENOUS | Status: AC
  Administered 2017-06-04: 1000 mL via INTRAVENOUS

## 2017-06-04 MED ORDER — IOPAMIDOL (ISOVUE-300) INJECTION 61%
75.0000 mL | Freq: Once | INTRAVENOUS | Status: AC | PRN
  Administered 2017-06-04: 60 mL via INTRAVENOUS

## 2017-06-04 MED ORDER — ONDANSETRON HCL 4 MG/2ML IJ SOLN
4.0000 mg | Freq: Once | INTRAMUSCULAR | Status: AC
  Administered 2017-06-04: 4 mg via INTRAVENOUS
  Filled 2017-06-04: qty 2

## 2017-06-04 NOTE — Telephone Encounter (Signed)
Bianca Shaw states that during the night she starting running a low grade fever again, she was again more confused, weak and she was unable to get up or stand on her own.  He had increased difficulty getting her up.  Advised that he should probably have her checked again and if he is having this much trouble getting her up he should call EMS to help.

## 2017-06-04 NOTE — ED Notes (Signed)
Pt up to toilet with assistance with this RN. Shuffling gait. No complications.

## 2017-06-04 NOTE — Telephone Encounter (Signed)
Pt's husband called stating that he is going to take pt to the ED today because Dr. Holley Raring with [facility] suggested that pt go to ED. Thanks TNP

## 2017-06-04 NOTE — ED Notes (Signed)
Pt walk to bathroom and back to wheelchair with walker with this RN without complication.

## 2017-06-04 NOTE — ED Notes (Signed)
Pt reports generalized weakness over the past 3 weeks. Pt has also been having bilateral leg pain and feels like her legs are not working. Pt states that she has been able to do her normal work but her legs feel more tired after she does.

## 2017-06-04 NOTE — Telephone Encounter (Signed)
Pt's husband wanting a call to discuss if anything else can be done for the patient.  States the patient had a horrible night last night and wasn't even able to walk to the bathroom.  States he is hoping to have a test ordered today to tell what is going on with her.

## 2017-06-04 NOTE — ED Notes (Signed)
Pt reports that 2 weeks ago she was having flu like symptoms. Pt reports fever up to 102, body aches, and cough. Pt states that she is still having generalized body aches, mainly in her legs.

## 2017-06-04 NOTE — ED Triage Notes (Signed)
Pt came to ED via pov. Reports kidney doctor sent her over. Pt reports feeling weak, reports cannot use legs. Has been having n/v (but husband reports that is normal for her.) VS stable at this point. Per husband, pt was running low grade temp last night, 98.8 in triage

## 2017-06-04 NOTE — ED Notes (Signed)
Paper d/c signed b/c pt in hallway bed, no esig pad available

## 2017-06-04 NOTE — ED Notes (Signed)

## 2017-06-04 NOTE — ED Provider Notes (Signed)
[facility] Emergency Department Provider Note  ____________________________________________  Time seen: Approximately 5:47 PM  I have reviewed the triage vital signs and the nursing notes.   HISTORY  Chief Complaint Weakness    HPI Bianca Shaw is a 72 y.o. female with a history of breast cancer not currently under treatment, HTN, scleroderma, presenting for inability to walk.  The patient is accompanied by her daughter and her husband, with a very difficult to obtain history.  She has multiple chronic medical conditions and contact with multiple medical specialists, of which her testing, diagnoses, and therapeutics are unclear when described by the patient and her family.  However, the patient does report that further months she has been told she is hyponatremic and therefore needs to decrease her fluid intake.  That for the past 3 weeks, she has had daily nausea without vomiting; no fevers, chills, abdominal pain or constipation or diarrhea.  2 weeks ago, she developed fevers and cough, body aches, and was told she had influenza although she tested negative for flu at her 14 office.  She has had generalized, nonfocal weakness which is getting progressively worse, but today she was unable to walk.  She denies any urinary symptoms, chest pain, shortness of breath.  She and her family describe multiple outstanding tests including a CT of the chest due to an abnormal chest x-ray, thyroid testing and adrenal testing.  The patient has not had any focal HA, vision changes, numbness tingling or weakness.  I have reviewed some of the patient's medical chart, and do see that she follows with Dr. Rosanna Randy, has had a chronic anemia which is unchanged today, and has a CT of the chest scheduled for the end of this month, however her last chest x-ray did not show any acute abnormalities.    Past Medical History:  Diagnosis Date  . Anemia   . Anxiety disorder   .  Breast cancer of upper-outer quadrant of left female breast ([place]) 11/2015   pT2 pN0(i+).;ER+; PR +, her 2 neu not overexpressed.  Mastectomy, SLN, Mammoprint: Low risk.   . Cancer ([place]) 12/03/2015   left breast/ INVASIVE LOBULAR CARCINOMA.   . Cough    lingering, mild, finished Prednisone and anitbiotic 11/03/15  . Family history of adverse reaction to anesthesia    sister - PONV  . GERD (gastroesophageal reflux disease)   . H/O: hysterectomy   . Hypertension   . Osteopenia   . Osteoporosis   . Pericarditis    diagnonsed June, 2010, unclear etiology as of yer  . Personal history of tobacco use, presenting hazards to health 10/31/2015  . Scleroderma ([facility])    ONLY ON SKIN-MILD  . UTI (lower urinary tract infection)   . Wears dentures    full upper    Patient Active Problem List   Diagnosis Date Noted  . Low vitamin B12 level 04/09/2017  . Mass of left chest wall 12/15/2016  . Elevated troponin 07/16/2016  . Non-ST elevation (NSTEMI) myocardial infarction ([place]) 07/16/2016  . Hypokalemia 07/16/2016  . Gastroenteritis 07/16/2016  . Dyspnea   . Coronary artery disease involving native coronary artery of native heart with unstable angina pectoris ([place])   . Hematoma (nontraumatic) of breast 01/13/2016  . Scleroderma ([place]) 12/12/2015  . Breast cancer of upper-outer quadrant of left female breast ([place]) 12/06/2015  . Special screening for malignant neoplasms, colon   . Personal history of tobacco use, presenting hazards to health 10/31/2015  . Anxiety 05/23/2015  .  CAFL (chronic airflow limitation) ([place]) 05/23/2015  . Bloodgood disease 05/23/2015  . Essential (primary) hypertension 05/23/2015  . Acid reflux 05/23/2015  . Hypercholesteremia 05/23/2015  . Hyponatremia 05/23/2015  . Osteoporosis 05/23/2015  . Allergic rhinitis, seasonal 05/23/2015  . UNSPECIFIED ANEMIA 10/03/2008    Past Surgical History:  Procedure Laterality Date  . BREAST BIOPSY Right 2012   core - neg  .  BREAST BIOPSY Left 12/03/2015   INVASIVE LOBULAR CARCINOMA.   Marland Kitchen CATARACT EXTRACTION W/ INTRAOCULAR LENS IMPLANT Right   . COLONOSCOPY WITH PROPOFOL N/A 11/08/2015   Procedure: COLONOSCOPY WITH PROPOFOL;  Surgeon: Lucilla Lame, MD;  Location: [facility];  Service: Endoscopy;  Laterality: N/A;  . EVACUATION BREAST HEMATOMA Left 01/14/2016   Procedure: EVACUATION HEMATOMA BREAST;  Surgeon: Robert Bellow, MD;  Location: [facility];  Service: General;  Laterality: Left;  Marland Kitchen MASTECTOMY Left 2017   complete mastectomy  . MASTECTOMY W/ SENTINEL NODE BIOPSY Left 12/26/2015   Procedure: MASTECTOMY WITH SENTINEL LYMPH NODE BIOPSY;  Surgeon: Robert Bellow, MD;  Location: [facility];  Service: General;  Laterality: Left;  . RIGHT/LEFT HEART CATH AND CORONARY ANGIOGRAPHY N/A 07/22/2016   Procedure: Right/Left Heart Cath and Coronary Angiography;  Surgeon: Minna Merritts, MD;  Location: [facility];  Service: Cardiovascular;  Laterality: N/A;  . TUBAL LIGATION    . VESICOVAGINAL FISTULA CLOSURE W/ TAH      Current Outpatient Rx  . Order #: [id number] Class: Normal  . Order #: [id number] Class: Historical Med  . Order #: [id number] Class: Print  . Order #: [id number] Class: Normal  . Order #: [id number] Class: Historical Med  . Order #: [id number] Class: Normal  . Order #: [id number] Class: Normal  . Order #: [id number] Class: Normal  . Order #: [id number] Class: Historical Med  . Order #: [id number] Class: Historical Med  . Order #: [id number] Class: Normal  . Order #: [id number] Class: Normal  . Order #: [id number] Class: Normal  . Order #: [id number] Class: Normal  . Order #: [id number] Class: Normal  . Order #: [id number] Class: Normal  . Order #: [id number] Class: Historical Med  . Order #: [id number] Class: Normal  . Order #: [id number] Class: Normal  . Order #: [id number] Class: Normal  . Order #: [id number] Class: Normal  . Order #: [id number] Class: Normal  . Order #: [id number] Class: Normal     Allergies Diuretic  [buchu-cornsilk-ch grass-hydran]; Furosemide; Other; and Pimenta  Family History  Problem Relation Age of Onset  . Heart failure Mother   . Epilepsy Mother   . COPD Father   . Heart disease Father   . Anxiety disorder Sister   . Arthritis Brother   . Heart disease Brother   . Vaginal cancer Paternal Grandmother   . Heart attack Paternal Grandfather   . COPD Brother   . Kidney failure Brother   . COPD Brother   . Arthritis Sister   . Uterine cancer Unknown   . Diabetes Unknown   . Colon cancer Neg Hx   . Stomach cancer Neg Hx   . Breast cancer Neg Hx     Social History Social History   Tobacco Use  . Smoking status: Former Smoker    Packs/day: 0.75    Years: 40.00    Pack years: 30.00    Types: Cigarettes    Last attempt to quit: 11/07/2003    Years since quitting: 13.5  . Smokeless tobacco: Never Used  Substance Use Topics  . Alcohol use: Yes    Alcohol/week: 0.6 - 1.8 oz    Types: 1 -  3 Glasses of wine per week  . Drug use: No    Review of Systems Constitutional: No fever/chills.  Positive generalized weakness.  Positive generalized body aches.  Positive self restricted fluid intake. Eyes: No visual changes. ENT: No sore throat. No congestion or rhinorrhea. Cardiovascular: Denies chest pain. Denies palpitations. Respiratory: Denies shortness of breath.  No cough. Gastrointestinal: No abdominal pain.  Positive nausea, no vomiting.  No diarrhea.  No constipation. Genitourinary: Negative for dysuria. Musculoskeletal: Negative for back pain. Skin: Negative for rash. Neurological: Negative for headaches. No focal numbness, tingling or weakness.     ____________________________________________   PHYSICAL EXAM:  VITAL SIGNS: ED Triage Vitals [06/04/17 1159]  Enc Vitals Group     BP 134/67     Pulse Rate 62     Resp 16     Temp 98.8 F (37.1 C)     Temp src      SpO2 98 %     Weight 152 lb (68.9 kg)     Height      Head  Circumference      Peak Flow      Pain Score      Pain Loc      Pain Edu?      Excl. in [place]?     Constitutional: The patient is alert, oriented and answers questions appropriately.  She is chronically ill-appearing but nontoxic. Eyes: Conjunctivae are normal.  EOMI. No scleral icterus. Head: Atraumatic. Nose: No congestion/rhinnorhea. Mouth/Throat: Mucous membranes are extremely dry.  Neck: No stridor.  Supple.  No JVD.  No meningismus. Cardiovascular: Normal rate, regular rhythm. No murmurs, rubs or gallops.  Respiratory: Normal respiratory effort.  No accessory muscle use or retractions. Lungs CTAB.  No wheezes, rales or ronchi. Gastrointestinal: Soft, nontender and nondistended.  No guarding or rebound.  No peritoneal signs. Musculoskeletal: No LE edema. No ttp in the calves or palpable cords.  Negative Homan's sign. Neurologic: Alert and oriented 3. Speech is clear. Face and smile symmetric. Tongue is midline.  EOMI.  PERRLA.  No pronator drift. 5 out of 5 grip, biceps, triceps, hip flexors, plantar flexion and dorsiflexion. Normal sensation to light touch in the bilateral upper and lower extremities, and face.  Skin:  Skin is warm, dry and intact. No rash noted. Psychiatric: Mood and affect are normal. Speech and behavior are normal.  Normal judgement  ____________________________________________   LABS (all labs ordered are listed, but only abnormal results are displayed)  Labs Reviewed  BASIC METABOLIC PANEL - Abnormal; Notable for the following components:      Result Value   Sodium 124 (*)    Chloride 89 (*)    Glucose, Bld 120 (*)    BUN 21 (*)    Creatinine, Ser 1.28 (*)    Calcium 12.8 (*)    GFR calc non Af Amer 41 (*)    GFR calc Af Amer 47 (*)    All other components within normal limits  CBC - Abnormal; Notable for the following components:   RBC 3.36 (*)    Hemoglobin 11.4 (*)    HCT 32.8 (*)    All other components within normal limits  URINALYSIS,  COMPLETE (UACMP) WITH MICROSCOPIC - Abnormal; Notable for the following components:   Color, Urine YELLOW (*)    APPearance HAZY (*)    Squamous Epithelial / LPF 0-5 (*)    All other components within normal limits  TROPONIN I - Abnormal; Notable for the following components:  Troponin I 0.03 (*)    All other components within normal limits  TROPONIN I - Abnormal; Notable for the following components:   Troponin I 0.03 (*)    All other components within normal limits  TSH  CBG MONITORING, ED   ____________________________________________  EKG  ED ECG REPORT I, Eula Listen, the attending physician, personally viewed and interpreted this ECG.   Date: 06/04/2017  EKG Time: 1218  Rate: 58  Rhythm: sinus bradycardia  Axis: leftward  Intervals:none  ST&T Change: No STEMI  ____________________________________________  RADIOLOGY  Ct Chest W Contrast  Result Date: 06/04/2017 CLINICAL DATA:  Fever, cough and body aches. History of left-sided breast cancer with mastectomy. EXAM: CT CHEST WITH CONTRAST TECHNIQUE: Multidetector CT imaging of the chest was performed during intravenous contrast administration. CONTRAST:  66mL ISOVUE-300 IOPAMIDOL (ISOVUE-300) INJECTION 61% COMPARISON:  Chest radiography same day. Chest CT 07/16/2016. chest CT 07/04/2013. FINDINGS: Cardiovascular: Aortic atherosclerosis. 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 Possible flow-limiting stenosis of the proximal left subclavian artery. Extensive coronary artery calcification. Heart size is normal. No pericardial fluid. Mediastinum/Nodes: No mass or lymphadenopathy. Lungs/Pleura: No infiltrate or collapse. Benign appearing pleural and parenchymal scarring at both lung apices. Chronic 8 mm scar posteriorly in the right upper lobe, unchanged since March of last year. Chronic 5 mm calcified granuloma in the right middle lobe. No effusion.  Upper Abdomen: No significant finding. Musculoskeletal: Ordinary chronic degenerative changes of the spine. Previous left mastectomy. IMPRESSION: No sign of pneumonia, edema, collapse or effusion. Advanced atherosclerotic disease including aortic atherosclerosis, branch vessel atherosclerosis and coronary artery calcification. 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 Likely flow limiting stenosis of the proximal left subclavian artery. Right middle lobe calcified granuloma. Stable 8 mm scar posterior aspect of the right upper lobe. Stable since 2015 and therefore conclusively benign. Electronically Signed   By: [name] Chimes M.D.   On: 06/04/2017 18:40    ____________________________________________   PROCEDURES  Procedure(s) performed: None  Procedures  Critical Care performed: No ____________________________________________   INITIAL IMPRESSION / ASSESSMENT AND PLAN / ED COURSE  Pertinent labs & imaging results that were available during my care of the patient were reviewed by me and considered in my medical decision making (see chart for details).  72 y.o. female with multiple chronic comorbidities presenting with inability to walk today.  Overall, the patient is hemodynamically stable.  I do not see any evidence of an acute neurologic deficit today and stroke is very unlikely.  She does have a chronic hyponatremia, which has been grossly abnormal for many months, and a renal insufficiency which is not significantly worse than baseline.  She also has a chronic unchanged anemia.  The patient's fluid restriction diet is unclear to me; she does appear to be significantly dehydrated and has extremely dry mucous membranes.  We will start with intravenous fluids to see if this helps her feel better.  The patient has no focal findings on her abdominal examination, and her symptoms are not typical for influenza today.  I will plan to try to get in touch with  Dr. Rosanna Randy to get more information about this patient and her clinical history, and reevaluate the patient for final disposition.  I have reviewed the patient's medical chart.  ----------------------------------------- 9:44 PM on 06/04/2017 -----------------------------------------  The patient's workup in the emergency department has yielded no new findings.  She does  have baseline hyponatremia, which she has had for years, which is unchanged.  She has a creatinine of 1.28, which is better than her usual baseline as is her GFR 41; renal insufficiency is also baseline for her.  Her TSH is normal, her urine does not show infection.  Her EKG does not show ischemic changes and her troponin is negative x2.  The patient has a CT of the chest which does not show any cause for hyponatremia or other significant acute abnormality.  The patient has ambulated to the bathroom with some difficulty.  Her husband reports that he had to hold the patient up.  I have offered the patient and her husband admission for inability to walk, and they both have refused.  They understand that I am concerned she may become injured by falling if she goes home, but they still do not want to be admitted to the hospital.  I will provide the patient with a walker as well as instructions on how to use the walker, and instructions to make a follow-up appointment with her primary care physician on Monday.  The patient's have multiple other I follow on appointments with nephrology and neurology in the near future.  I did try to reach Dr. Rosanna Randy, but his office was closed.  I have discussed return precautions as well as follow-up instructions.  ____________________________________________  FINAL CLINICAL IMPRESSION(S) / ED DIAGNOSES  Final diagnoses:  Weakness  Hyponatremia  Renal insufficiency         NEW MEDICATIONS STARTED DURING THIS VISIT:  New Prescriptions   No medications on file      Eula Listen,  MD 06/04/17 2147

## 2017-06-04 NOTE — Discharge Instructions (Signed)
Please return to the emergency department if you develop severe pain, lightheadedness or fainting, instability in your feet, seizures, or any other symptoms concerning to you.

## 2017-06-04 NOTE — ED Notes (Signed)
Pt transported to CT by [facility]

## 2017-06-07 ENCOUNTER — Ambulatory Visit: Payer: PPO | Admitting: Family Medicine

## 2017-06-07 NOTE — Telephone Encounter (Signed)
Pt's husband called to see if Dr. Rosanna Randy could see pt today or maybe get her in with a neurologist that has something before March. Pt is scheduled to see Dr. Brigitte Pulse but not until March. Mr. Issa stated that pt is still very weak, having difficulty walking, and keep a fever around 100 degrees. Mr. Khalsa stated he took pt to the ED Friday 06/04/17 and all they did was give her fluids and send her home with a walker. Please advise. Thanks TNP

## 2017-06-07 NOTE — Telephone Encounter (Signed)
330 today--thx

## 2017-06-07 NOTE — Telephone Encounter (Signed)
Please advise. Thanks.  

## 2017-06-07 NOTE — Telephone Encounter (Signed)
Bianca Shaw,  [name] would like to see if you can get her neurology appointment sooner.  Either with the one she has now or with a different group.  Thanks

## 2017-06-08 DIAGNOSIS — E871 Hypo-osmolality and hyponatremia: Secondary | ICD-10-CM | POA: Diagnosis not present

## 2017-06-08 DIAGNOSIS — R809 Proteinuria, unspecified: Secondary | ICD-10-CM | POA: Diagnosis not present

## 2017-06-08 DIAGNOSIS — I10 Essential (primary) hypertension: Secondary | ICD-10-CM | POA: Diagnosis not present

## 2017-06-09 NOTE — Telephone Encounter (Signed)
Appointment made with Dr Melrose Nakayama for 06/16/17 at 10:00.Pt's husband [name] advised

## 2017-06-10 ENCOUNTER — Encounter: Payer: Self-pay | Admitting: Specialist

## 2017-06-10 ENCOUNTER — Other Ambulatory Visit: Payer: Self-pay

## 2017-06-10 ENCOUNTER — Inpatient Hospital Stay
Admission: AD | Admit: 2017-06-10 | Discharge: 2017-06-12 | DRG: 645 | Disposition: A | Discharge status: Home Health | Payer: PPO | Source: Ambulatory Visit | Attending: Internal Medicine | Admitting: Internal Medicine

## 2017-06-10 DIAGNOSIS — Z7982 Long term (current) use of aspirin: Secondary | ICD-10-CM

## 2017-06-10 DIAGNOSIS — Z9841 Cataract extraction status, right eye: Secondary | ICD-10-CM

## 2017-06-10 DIAGNOSIS — Z825 Family history of asthma and other chronic lower respiratory diseases: Secondary | ICD-10-CM | POA: Diagnosis not present

## 2017-06-10 DIAGNOSIS — N183 Chronic kidney disease, stage 3 (moderate): Secondary | ICD-10-CM | POA: Diagnosis not present

## 2017-06-10 DIAGNOSIS — Z87891 Personal history of nicotine dependence: Secondary | ICD-10-CM | POA: Diagnosis not present

## 2017-06-10 DIAGNOSIS — Z9221 Personal history of antineoplastic chemotherapy: Secondary | ICD-10-CM | POA: Diagnosis not present

## 2017-06-10 DIAGNOSIS — I129 Hypertensive chronic kidney disease with stage 1 through stage 4 chronic kidney disease, or unspecified chronic kidney disease: Secondary | ICD-10-CM | POA: Diagnosis present

## 2017-06-10 DIAGNOSIS — Z91048 Other nonmedicinal substance allergy status: Secondary | ICD-10-CM

## 2017-06-10 DIAGNOSIS — E876 Hypokalemia: Secondary | ICD-10-CM | POA: Diagnosis present

## 2017-06-10 DIAGNOSIS — Z818 Family history of other mental and behavioral disorders: Secondary | ICD-10-CM | POA: Diagnosis not present

## 2017-06-10 DIAGNOSIS — Z888 Allergy status to other drugs, medicaments and biological substances status: Secondary | ICD-10-CM | POA: Diagnosis not present

## 2017-06-10 DIAGNOSIS — I10 Essential (primary) hypertension: Secondary | ICD-10-CM | POA: Diagnosis not present

## 2017-06-10 DIAGNOSIS — Z809 Family history of malignant neoplasm, unspecified: Secondary | ICD-10-CM

## 2017-06-10 DIAGNOSIS — Z961 Presence of intraocular lens: Secondary | ICD-10-CM | POA: Diagnosis present

## 2017-06-10 DIAGNOSIS — Z90710 Acquired absence of both cervix and uterus: Secondary | ICD-10-CM

## 2017-06-10 DIAGNOSIS — E871 Hypo-osmolality and hyponatremia: Secondary | ICD-10-CM | POA: Diagnosis present

## 2017-06-10 DIAGNOSIS — M810 Age-related osteoporosis without current pathological fracture: Secondary | ICD-10-CM | POA: Diagnosis present

## 2017-06-10 DIAGNOSIS — Z87440 Personal history of urinary (tract) infections: Secondary | ICD-10-CM

## 2017-06-10 DIAGNOSIS — Z8249 Family history of ischemic heart disease and other diseases of the circulatory system: Secondary | ICD-10-CM | POA: Diagnosis not present

## 2017-06-10 DIAGNOSIS — Z7983 Long term (current) use of bisphosphonates: Secondary | ICD-10-CM

## 2017-06-10 DIAGNOSIS — R809 Proteinuria, unspecified: Secondary | ICD-10-CM | POA: Diagnosis not present

## 2017-06-10 DIAGNOSIS — E785 Hyperlipidemia, unspecified: Secondary | ICD-10-CM | POA: Diagnosis present

## 2017-06-10 DIAGNOSIS — K219 Gastro-esophageal reflux disease without esophagitis: Secondary | ICD-10-CM | POA: Diagnosis not present

## 2017-06-10 DIAGNOSIS — Z8261 Family history of arthritis: Secondary | ICD-10-CM

## 2017-06-10 DIAGNOSIS — Z820 Family history of epilepsy and other diseases of the nervous system: Secondary | ICD-10-CM | POA: Diagnosis not present

## 2017-06-10 DIAGNOSIS — Z9012 Acquired absence of left breast and nipple: Secondary | ICD-10-CM | POA: Diagnosis not present

## 2017-06-10 DIAGNOSIS — F419 Anxiety disorder, unspecified: Secondary | ICD-10-CM | POA: Diagnosis not present

## 2017-06-10 DIAGNOSIS — R1111 Vomiting without nausea: Secondary | ICD-10-CM | POA: Diagnosis not present

## 2017-06-10 DIAGNOSIS — M349 Systemic sclerosis, unspecified: Secondary | ICD-10-CM | POA: Diagnosis present

## 2017-06-10 DIAGNOSIS — Z853 Personal history of malignant neoplasm of breast: Secondary | ICD-10-CM | POA: Diagnosis not present

## 2017-06-10 DIAGNOSIS — Z841 Family history of disorders of kidney and ureter: Secondary | ICD-10-CM | POA: Diagnosis not present

## 2017-06-10 DIAGNOSIS — R911 Solitary pulmonary nodule: Secondary | ICD-10-CM | POA: Diagnosis present

## 2017-06-10 DIAGNOSIS — E222 Syndrome of inappropriate secretion of antidiuretic hormone: Principal | ICD-10-CM | POA: Diagnosis present

## 2017-06-10 DIAGNOSIS — Z79899 Other long term (current) drug therapy: Secondary | ICD-10-CM

## 2017-06-10 DIAGNOSIS — R531 Weakness: Secondary | ICD-10-CM | POA: Diagnosis not present

## 2017-06-10 LAB — CBC
HEMATOCRIT: 27.5 % — AB (ref 35.0–47.0)
HEMOGLOBIN: 9.5 g/dL — AB (ref 12.0–16.0)
MCH: 34.1 pg — AB (ref 26.0–34.0)
MCHC: 34.7 g/dL (ref 32.0–36.0)
MCV: 98.5 fL (ref 80.0–100.0)
Platelets: 264 10*3/uL (ref 150–440)
RBC: 2.79 MIL/uL — ABNORMAL LOW (ref 3.80–5.20)
RDW: 12.6 % (ref 11.5–14.5)
WBC: 6.9 10*3/uL (ref 3.6–11.0)

## 2017-06-10 LAB — COMPREHENSIVE METABOLIC PANEL
ALK PHOS: 58 U/L (ref 38–126)
ALT: 36 U/L (ref 14–54)
ANION GAP: 9 (ref 5–15)
AST: 25 U/L (ref 15–41)
Albumin: 2.9 g/dL — ABNORMAL LOW (ref 3.5–5.0)
BILIRUBIN TOTAL: 0.4 mg/dL (ref 0.3–1.2)
BUN: 21 mg/dL — ABNORMAL HIGH (ref 6–20)
CALCIUM: 11.6 mg/dL — AB (ref 8.9–10.3)
CO2: 26 mmol/L (ref 22–32)
Chloride: 89 mmol/L — ABNORMAL LOW (ref 101–111)
Creatinine, Ser: 1.25 mg/dL — ABNORMAL HIGH (ref 0.44–1.00)
GFR, EST AFRICAN AMERICAN: 49 mL/min — AB (ref >60)
GFR, EST NON AFRICAN AMERICAN: 42 mL/min — AB (ref >60)
Glucose, Bld: 104 mg/dL — ABNORMAL HIGH (ref 65–99)
Potassium: 3.6 mmol/L (ref 3.5–5.1)
SODIUM: 124 mmol/L — AB (ref 135–145)
TOTAL PROTEIN: 6.2 g/dL — AB (ref 6.5–8.1)

## 2017-06-10 LAB — SODIUM: Sodium: 124 mmol/L — ABNORMAL LOW (ref 135–145)

## 2017-06-10 MED ORDER — ENOXAPARIN SODIUM 40 MG/0.4ML ~~LOC~~ SOLN
40.0000 mg | SUBCUTANEOUS | Status: DC
  Administered 2017-06-10 – 2017-06-11 (×2): 40 mg via SUBCUTANEOUS
  Filled 2017-06-10 (×2): qty 0.4

## 2017-06-10 MED ORDER — ACETAMINOPHEN 325 MG PO TABS: 650.0000 mg | ORAL_TABLET | Freq: Four times a day (QID) | ORAL | Status: DC | PRN

## 2017-06-10 MED ORDER — ALBUTEROL SULFATE (2.5 MG/3ML) 0.083% IN NEBU: 2.5000 mg | INHALATION_SOLUTION | Freq: Four times a day (QID) | RESPIRATORY_TRACT | Status: DC | PRN

## 2017-06-10 MED ORDER — NITROGLYCERIN 0.4 MG SL SUBL: 0.4000 mg | SUBLINGUAL_TABLET | SUBLINGUAL | Status: DC | PRN

## 2017-06-10 MED ORDER — ALPRAZOLAM 0.25 MG PO TABS
0.2500 mg | ORAL_TABLET | Freq: Two times a day (BID) | ORAL | Status: DC | PRN
  Administered 2017-06-10: 0.25 mg via ORAL
  Filled 2017-06-10: qty 1

## 2017-06-10 MED ORDER — POTASSIUM CHLORIDE CRYS ER 20 MEQ PO TBCR
20.0000 meq | EXTENDED_RELEASE_TABLET | Freq: Every day | ORAL | Status: DC
  Administered 2017-06-11: 20 meq via ORAL
  Filled 2017-06-10: qty 1

## 2017-06-10 MED ORDER — ACETAMINOPHEN 650 MG RE SUPP: 650.0000 mg | Freq: Four times a day (QID) | RECTAL | Status: DC | PRN

## 2017-06-10 MED ORDER — CLOTRIMAZOLE 10 MG MT LOZG
10.0000 mg | LOZENGE | Freq: Four times a day (QID) | OROMUCOSAL | Status: DC
  Administered 2017-06-10 – 2017-06-12 (×6): 10 mg via ORAL
  Filled 2017-06-10 (×9): qty 1

## 2017-06-10 MED ORDER — TAMOXIFEN CITRATE 10 MG PO TABS
20.0000 mg | ORAL_TABLET | Freq: Every day | ORAL | Status: DC
  Administered 2017-06-10 – 2017-06-12 (×3): 20 mg via ORAL
  Filled 2017-06-10 (×3): qty 2

## 2017-06-10 MED ORDER — PANTOPRAZOLE SODIUM 40 MG PO TBEC
40.0000 mg | DELAYED_RELEASE_TABLET | Freq: Every day | ORAL | Status: DC
  Administered 2017-06-10 – 2017-06-12 (×3): 40 mg via ORAL
  Filled 2017-06-10 (×3): qty 1

## 2017-06-10 MED ORDER — FLUTICASONE PROPIONATE 50 MCG/ACT NA SUSP
2.0000 | Freq: Every day | NASAL | Status: DC | PRN
  Filled 2017-06-10: qty 16

## 2017-06-10 MED ORDER — TOLVAPTAN 15 MG PO TABS
15.0000 mg | ORAL_TABLET | ORAL | Status: DC
  Administered 2017-06-10: 15 mg via ORAL
  Filled 2017-06-10 (×2): qty 1

## 2017-06-10 MED ORDER — ONDANSETRON HCL 4 MG/2ML IJ SOLN
4.0000 mg | Freq: Four times a day (QID) | INTRAMUSCULAR | Status: DC | PRN
  Administered 2017-06-11: 4 mg via INTRAVENOUS
  Filled 2017-06-10: qty 2

## 2017-06-10 MED ORDER — ONDANSETRON HCL 4 MG PO TABS: 4.0000 mg | ORAL_TABLET | Freq: Four times a day (QID) | ORAL | Status: DC | PRN

## 2017-06-10 MED ORDER — HYDRALAZINE HCL 25 MG PO TABS
25.0000 mg | ORAL_TABLET | Freq: Two times a day (BID) | ORAL | Status: DC
  Administered 2017-06-10 – 2017-06-12 (×4): 25 mg via ORAL
  Filled 2017-06-10 (×5): qty 1

## 2017-06-10 MED ORDER — BUSPIRONE HCL 5 MG PO TABS
5.0000 mg | ORAL_TABLET | Freq: Every day | ORAL | Status: DC
  Administered 2017-06-10 – 2017-06-11 (×2): 5 mg via ORAL
  Filled 2017-06-10 (×3): qty 1

## 2017-06-10 MED ORDER — ASPIRIN EC 81 MG PO TBEC
81.0000 mg | DELAYED_RELEASE_TABLET | Freq: Every day | ORAL | Status: DC
  Administered 2017-06-11 – 2017-06-12 (×2): 81 mg via ORAL
  Filled 2017-06-10 (×2): qty 1

## 2017-06-10 MED ORDER — METOPROLOL SUCCINATE ER 25 MG PO TB24
25.0000 mg | ORAL_TABLET | Freq: Every day | ORAL | Status: DC
  Administered 2017-06-11 – 2017-06-12 (×2): 25 mg via ORAL
  Filled 2017-06-10 (×2): qty 1

## 2017-06-10 MED ORDER — ORAL CARE MOUTH RINSE
15.0000 mL | Freq: Two times a day (BID) | OROMUCOSAL | Status: DC
  Administered 2017-06-10 – 2017-06-12 (×2): 15 mL via OROMUCOSAL

## 2017-06-10 MED ORDER — CHLORHEXIDINE GLUCONATE 0.12 % MT SOLN
15.0000 mL | Freq: Two times a day (BID) | OROMUCOSAL | Status: DC
  Administered 2017-06-10 – 2017-06-12 (×4): 15 mL via OROMUCOSAL
  Filled 2017-06-10 (×4): qty 15

## 2017-06-10 MED ORDER — SODIUM CHLORIDE 0.9% FLUSH
3.0000 mL | Freq: Two times a day (BID) | INTRAVENOUS | Status: DC
  Administered 2017-06-10 – 2017-06-12 (×3): 3 mL via INTRAVENOUS

## 2017-06-10 NOTE — H&P (Signed)
Bianca Shaw at [facility] NAME: Bianca Shaw    MR#:  [id number]  DATE OF BIRTH:  1945/09/06  DATE OF ADMISSION:  06/10/2017  PRIMARY CARE PHYSICIAN: Jerrol Banana., MD   REQUESTING/REFERRING PHYSICIAN: Dr. Anthonette Legato  CHIEF COMPLAINT:  No chief complaint on file. Weakness, body aches and difficulty walking.   HISTORY OF PRESENT ILLNESS:  Bianca Shaw  is a 72 y.o. female with a known history of breast cancer, anxiety, history of pericarditis, recurrent urinary tract infection, scleroderma, GERD, essential hypertension who presents to the hospital for direct admission due to profound weakness and body aches and difficulty walking and noted to be hyponatremic.  As per the patient's husband patient has been feeling increasingly weak with on and off nausea vomiting and body aches for the past few months.  Patient was seen by her primary care physician and had a flu test checked which was negative, patient was then referred to nephrology who did some blood work and noted to her to be a bit hyponatremic.  Patient's medications were adjusted but patient continued to have persistent symptoms and therefore presented to the ER a few days back was noted to have a sodium of 124, patient was given some IV fluids and discharged home from the emergency room.  Patient continues to be significantly lethargic weak having some body aches and intermittent nausea vomiting and therefore went back to see a nephrologist who then referred her for direct admission due to symptomatic hyponatremia.  PAST MEDICAL HISTORY:   Past Medical History:  Diagnosis Date  . Anemia   . Anxiety disorder   . Breast cancer of upper-outer quadrant of left female breast ([place]) 11/2015   pT2 pN0(i+).;ER+; PR +, her 2 neu not overexpressed.  Mastectomy, SLN, Mammoprint: Low risk.   . Cancer ([place]) 12/03/2015   left breast/ INVASIVE LOBULAR CARCINOMA.   . Cough    lingering, mild,  finished Prednisone and anitbiotic 11/03/15  . Family history of adverse reaction to anesthesia    sister - PONV  . GERD (gastroesophageal reflux disease)   . H/O: hysterectomy   . Hypertension   . Osteopenia   . Osteoporosis   . Pericarditis    diagnonsed June, 2010, unclear etiology as of yer  . Personal history of tobacco use, presenting hazards to health 10/31/2015  . Scleroderma ([facility])    ONLY ON SKIN-MILD  . UTI (lower urinary tract infection)   . Wears dentures    full upper    PAST SURGICAL HISTORY:   Past Surgical History:  Procedure Laterality Date  . BREAST BIOPSY Right 2012   core - neg  . BREAST BIOPSY Left 12/03/2015   INVASIVE LOBULAR CARCINOMA.   Marland Kitchen CATARACT EXTRACTION W/ INTRAOCULAR LENS IMPLANT Right   . COLONOSCOPY WITH PROPOFOL N/A 11/08/2015   Procedure: COLONOSCOPY WITH PROPOFOL;  Surgeon: Lucilla Lame, MD;  Location: [facility];  Service: Endoscopy;  Laterality: N/A;  . EVACUATION BREAST HEMATOMA Left 01/14/2016   Procedure: EVACUATION HEMATOMA BREAST;  Surgeon: Robert Bellow, MD;  Location: [facility];  Service: General;  Laterality: Left;  Marland Kitchen MASTECTOMY Left 2017   complete mastectomy  . MASTECTOMY W/ SENTINEL NODE BIOPSY Left 12/26/2015   Procedure: MASTECTOMY WITH SENTINEL LYMPH NODE BIOPSY;  Surgeon: Robert Bellow, MD;  Location: [facility];  Service: General;  Laterality: Left;  . RIGHT/LEFT HEART CATH AND CORONARY ANGIOGRAPHY N/A 07/22/2016   Procedure: Right/Left Heart Cath and Coronary  Angiography;  Surgeon: Minna Merritts, MD;  Location: [facility];  Service: Cardiovascular;  Laterality: N/A;  . TUBAL LIGATION    . VESICOVAGINAL FISTULA CLOSURE W/ TAH      SOCIAL HISTORY:   Social History   Tobacco Use  . Smoking status: Former Smoker    Packs/day: 0.75    Years: 40.00    Pack years: 30.00    Types: Cigarettes    Last attempt to quit: 11/07/2003    Years since quitting: 13.6  . Smokeless tobacco: Never Used  Substance  Use Topics  . Alcohol use: Yes    Alcohol/week: 0.6 - 1.8 oz    Types: 1 - 3 Glasses of wine per week    Comment: Occasionally.      FAMILY HISTORY:   Family History  Problem Relation Age of Onset  . Heart failure Mother   . Epilepsy Mother   . COPD Father   . Heart disease Father   . Anxiety disorder Sister   . Arthritis Brother   . Heart disease Brother   . Vaginal cancer Paternal Grandmother   . Heart attack Paternal Grandfather   . COPD Brother   . Kidney failure Brother   . COPD Brother   . Arthritis Sister   . Uterine cancer Unknown   . Diabetes Unknown   . Colon cancer Neg Hx   . Stomach cancer Neg Hx   . Breast cancer Neg Hx     DRUG ALLERGIES:   Allergies  Allergen Reactions  . Diuretic  [Buchu-Cornsilk-Ch Grass-Hydran]     hyponatremia  . Furosemide     hives  . Other     SSRI---hyponatremia  . Pimenta Nausea And Vomiting    REVIEW OF SYSTEMS:   Review of Systems  Constitutional: Negative for fever and weight loss.  HENT: Negative for congestion, nosebleeds and tinnitus.   Eyes: Negative for blurred vision, double vision and redness.  Respiratory: Negative for cough, hemoptysis and shortness of breath.   Cardiovascular: Negative for chest pain, orthopnea, leg swelling and PND.  Gastrointestinal: Positive for nausea and vomiting. Negative for abdominal pain, diarrhea and melena.  Genitourinary: Negative for dysuria, hematuria and urgency.  Musculoskeletal: Negative for falls and joint pain.  Neurological: Positive for weakness. Negative for dizziness, tingling, sensory change, focal weakness, seizures and headaches.  Endo/Heme/Allergies: Negative for polydipsia. Does not bruise/bleed easily.  Psychiatric/Behavioral: Negative for depression and memory loss. The patient is not nervous/anxious.     MEDICATIONS AT HOME:   Prior to Admission medications   Medication Sig Start Date End Date Taking? Authorizing Provider  albuterol (PROVENTIL) (2.5  MG/3ML) 0.083% nebulizer solution Take 3 mLs (2.5 mg total) by nebulization every 6 (six) hours as needed for wheezing or shortness of breath. 06/06/15   Mar Daring, PA-C  alendronate (FOSAMAX) 70 MG tablet Take 70 mg by mouth once a week.  06/12/16   [provider]  ALPRAZolam Duanne Moron) 0.5 MG tablet 1/2 tablet twice daily as needed 10/13/16   Jerrol Banana., MD  amLODipine-olmesartan (AZOR) 10-40 MG tablet Take 1 tablet by mouth as needed. 10/13/16   Jerrol Banana., MD  aspirin 81 MG tablet Take 81 mg by mouth daily.  11/26/10   [provider]  busPIRone (BUSPAR) 5 MG tablet 1 Tablet, Oral QHS 10/13/16   Jerrol Banana., MD  clotrimazole [facility]) 10 MG troche Take 1 lozenge (10 mg total) by mouth 4 (four) times  daily. 06/02/17   Jerrol Banana., MD  cyanocobalamin 2000 MCG tablet Take 2,000 mcg by mouth daily.    [provider]  fluticasone (FLONASE) 50 MCG/ACT nasal spray Place 2 sprays into the nose as needed.  11/26/10   [provider]  hydrALAZINE (APRESOLINE) 25 MG tablet Take 1 tablet (25 mg total) by mouth 2 (two) times daily. 04/29/17   Jerrol Banana., MD  hydrochlorothiazide (HYDRODIURIL) 25 MG tablet Take 1 tablet (25 mg total) by mouth daily. 03/29/17   Jerrol Banana., MD  isosorbide mononitrate (IMDUR) 30 MG 24 hr tablet Take 1 tablet (30 mg total) by mouth daily. Patient not taking: Reported on 05/24/2017 07/17/16   Bettey Costa, MD  metoprolol succinate (TOPROL-XL) 25 MG 24 hr tablet Take 1 tablet (25 mg total) by mouth daily. 05/24/17   Jerrol Banana., MD  nitroGLYCERIN (NITROSTAT) 0.4 MG SL tablet Place 1 tablet (0.4 mg total) under the tongue every 5 (five) minutes as needed for chest pain. 07/17/16   Bettey Costa, MD  nystatin (MYCOSTATIN) 100000 UNIT/ML suspension Take 5 mLs (500,000 Units total) by mouth 4 (four) times daily. Swish and spit 06/01/17   Jerrol Banana., MD  omeprazole (PRILOSEC)  20 MG capsule Take 20 mg by mouth as needed.     [provider]  ondansetron (ZOFRAN) 4 MG tablet Take 1 tablet (4 mg total) by mouth every 8 (eight) hours as needed for nausea or vomiting. 06/02/17   Mar Daring, PA-C  potassium chloride SA (K-DUR,KLOR-CON) 20 MEQ tablet Take 1 tablet (20 mEq total) by mouth daily as needed. 08/03/16   Minna Merritts, MD  simvastatin (ZOCOR) 40 MG tablet Take 1 tablet (40 mg total) by mouth daily. Patient not taking: Reported on 04/29/2017 07/17/16   Bettey Costa, MD  sulfamethoxazole-trimethoprim (BACTRIM DS,SEPTRA DS) 800-160 MG tablet Take 1 tablet by mouth 2 (two) times daily. 05/28/17   Jerrol Banana., MD  tamoxifen (NOLVADEX) 20 MG tablet Take 1 tablet (20 mg total) by mouth daily. 06/09/16   Lequita Asal, MD      VITAL SIGNS:  There were no vitals taken for this visit.  PHYSICAL EXAMINATION:  Physical Exam  GENERAL:  72 y.o.-year-old patient lying in the bed in no acute distress.  EYES: Pupils equal, round, reactive to light and accommodation. No scleral icterus. Extraocular muscles intact.  HEENT: Head atraumatic, normocephalic. Oropharynx and nasopharynx clear. No oropharyngeal erythema, moist oral mucosa  NECK:  Supple, no jugular venous distention. No thyroid enlargement, no tenderness.  LUNGS: Normal breath sounds bilaterally, no wheezing, rales, rhonchi. No use of accessory muscles of respiration.  CARDIOVASCULAR: S1, S2 RRR. No murmurs, rubs, gallops, clicks.  ABDOMEN: Soft, nontender, nondistended. Bowel sounds present. No organomegaly or mass.  EXTREMITIES: No pedal edema, cyanosis, or clubbing. + 2 pedal & radial pulses b/l.   NEUROLOGIC: Cranial nerves II through XII are intact. No focal Motor or sensory deficits appreciated b/l. Globally weak.  PSYCHIATRIC: The patient is alert and oriented x 3.  SKIN: No obvious rash, lesion, or ulcer.   LABORATORY PANEL:   CBC Recent Labs  Lab 06/04/17 1200  WBC 5.6   HGB 11.4*  HCT 32.8*  PLT 295   ------------------------------------------------------------------------------------------------------------------  Chemistries  Recent Labs  Lab 06/04/17 1200  NA 124*  K 4.4  CL 89*  CO2 25  GLUCOSE 120*  BUN 21*  CREATININE 1.28*  CALCIUM 12.8*   ------------------------------------------------------------------------------------------------------------------  Cardiac Enzymes Recent Labs  Lab 06/04/17 1743  TROPONINI 0.03*   ------------------------------------------------------------------------------------------------------------------  RADIOLOGY:  No results found.   IMPRESSION AND PLAN:   72 year old female with past medical history of scleroderma, GERD, hypertension, history of breast cancer, anxiety who presents to the hospital due to generalized weakness, nausea/vomiting body aches and noted to be hyponatremic and symptomatic with it.  1.  Acute hyponatremia-this is the cause of patient's profound weakness and nausea. - Patient has been tried on IV fluids and did not improve.  Discussed with nephrology and will start the patient on tolvaptan and and follow serum sodium. -I will get a nephrology consult.  2.  Generalized weakness-multifactorial related to underlying hyponatremia and also deconditioning. -I will treat the underlying hyponatremia and also get a physical therapy consult to assess the patient's mobility.  3.  History of breast cancer-continue tamoxifen.  4.  Essential hypertension- continue hydralazine, metoprolol, indoor.  5.  Anxiety-continue BuSpar/Xanax.  6.  GERD-continue Protonix.  7.  Hyperlipidemia-continue simvastatin.    All the records are reviewed and case discussed with ED provider. Management plans discussed with the patient, family and they are in agreement.  CODE STATUS: Full code  TOTAL TIME TAKING CARE OF THIS PATIENT: 45 minutes.    Henreitta Leber M.D on 06/10/2017 at 5:22  PM  Between 7am to 6pm - Pager - [phone]  After 6pm go to [email or web address] - password [email or web address] [facility]  [facility]  Office  [phone]  CC: Primary care physician; Jerrol Banana., MD

## 2017-06-10 NOTE — Progress Notes (Signed)
First dose of Samsca given.  Patient given the Samsca patient guide.

## 2017-06-11 ENCOUNTER — Inpatient Hospital Stay: Payer: PPO

## 2017-06-11 LAB — CBC
HCT: 29.3 % — ABNORMAL LOW (ref 35.0–47.0)
Hemoglobin: 10.3 g/dL — ABNORMAL LOW (ref 12.0–16.0)
MCH: 34.1 pg — ABNORMAL HIGH (ref 26.0–34.0)
MCHC: 35.2 g/dL (ref 32.0–36.0)
MCV: 97.1 fL (ref 80.0–100.0)
Platelets: 268 K/uL (ref 150–440)
RBC: 3.02 MIL/uL — ABNORMAL LOW (ref 3.80–5.20)
RDW: 12.6 % (ref 11.5–14.5)
WBC: 6.7 K/uL (ref 3.6–11.0)

## 2017-06-11 LAB — CK: CK TOTAL: 12 U/L — AB (ref 38–234)

## 2017-06-11 LAB — BASIC METABOLIC PANEL
ANION GAP: 10 (ref 5–15)
BUN: 17 mg/dL (ref 6–20)
CO2: 25 mmol/L (ref 22–32)
Calcium: 11.7 mg/dL — ABNORMAL HIGH (ref 8.9–10.3)
Chloride: 95 mmol/L — ABNORMAL LOW (ref 101–111)
Creatinine, Ser: 1.21 mg/dL — ABNORMAL HIGH (ref 0.44–1.00)
GFR calc Af Amer: 51 mL/min — ABNORMAL LOW (ref >60)
GFR, EST NON AFRICAN AMERICAN: 44 mL/min — AB (ref >60)
GLUCOSE: 96 mg/dL (ref 65–99)
Potassium: 3.3 mmol/L — ABNORMAL LOW (ref 3.5–5.1)
Sodium: 130 mmol/L — ABNORMAL LOW (ref 135–145)

## 2017-06-11 MED ORDER — ENSURE ENLIVE PO LIQD
237.0000 mL | Freq: Two times a day (BID) | ORAL | Status: DC
  Administered 2017-06-12 (×2): 237 mL via ORAL

## 2017-06-11 MED ORDER — POTASSIUM CHLORIDE CRYS ER 20 MEQ PO TBCR: 20.0000 meq | EXTENDED_RELEASE_TABLET | Freq: Once | ORAL | Status: DC

## 2017-06-11 MED ORDER — IOPAMIDOL (ISOVUE-300) INJECTION 61%
15.0000 mL | INTRAVENOUS | Status: DC
  Administered 2017-06-11: 15 mL via ORAL

## 2017-06-11 NOTE — Progress Notes (Signed)
PT Cancellation Note  Patient Details Name: Bianca Shaw MRN: [id number] DOB: 11/02/1945   Cancelled Treatment:    Reason Eval/Treat Not Completed: Medical issues which prohibited therapy(Consult received and chart reviewed.  Per primary RN, patient with significant nausea/vomiting this date; preparing to leave unit for abdominal CT.  Recommends hold on PT evaluation at this time with re-attempt next date.  Will continue efforts as appropriate.)   Dreyson Mishkin H. Owens Shark, PT, DPT, NCS 06/11/17, 2:30 PM [phone]

## 2017-06-11 NOTE — Progress Notes (Signed)
Initial Nutrition Assessment  DOCUMENTATION CODES:   Not applicable  INTERVENTION:  Ensure Enlive po BID, each supplement provides 350 kcal and 20 grams of protein   NUTRITION DIAGNOSIS:   Inadequate oral intake related to poor appetite as evidenced by meal completion < 25%.  GOAL:   Patient will meet greater than or equal to 90% of their needs  MONITOR:   PO intake, I & O's, Labs, Supplement acceptance, Weight trends  REASON FOR ASSESSMENT:   Malnutrition Screening Tool    ASSESSMENT:   Bianca Shaw is a 72 yo female with PMH Breast CA, Anxiety, pericarditis, recurrent UTIs, scleroderma, GERD, HTN presents as direct admit with weakness, aches, hyponatremia, on and off nausea/vomiting for a few months.  Spoke with Ms. Mccollister at bedside. She states for the past 3 weeks she has been vomiting daily, with 36 pound weight loss and a UBW of 152 pounds. Per chart, she was 152 pounds upon admit 1/28 and 2/8, both of which appear stated. Prior to that patient was 156 pounds 04/29/2017, indicating a 16 pound/10% severe weight loss over 1.25 months. During these 3 weeks she says she was eating very little, not meeting her calorie needs. Despite this, NFPE ok, appetite is good today. Was drinking contrast for a CT scan for but vomited it, but wanted to eat tray she was brought.  Labs reviewed:  Na 130, K+ 3.3,  Medications reviewed and include:  20 K+   NUTRITION - FOCUSED PHYSICAL EXAM:    Most Recent Value  Orbital Region  No depletion  Upper Arm Region  No depletion  Thoracic and Lumbar Region  No depletion  Buccal Region  Mild depletion  Temple Region  Mild depletion  Clavicle Bone Region  No depletion  Clavicle and Acromion Bone Region  No depletion  Scapular Bone Region  No depletion  Dorsal Hand  No depletion  Patellar Region  No depletion  Anterior Thigh Region  No depletion  Posterior Calf Region  No depletion  Edema (RD Assessment)  Mild  Hair  Reviewed  Eyes   Reviewed  Mouth  Reviewed  Skin  Reviewed  Nails  Reviewed       Diet Order:  Diet Heart Room service appropriate? Yes; Fluid consistency: Thin  EDUCATION NEEDS:   Education needs have been addressed  Skin:  Skin Assessment: Reviewed RN Assessment  Last BM:  06/10/2017  Height:   Ht Readings from Last 1 Encounters:  06/10/17 5\' 1"  (1.549 m)    Weight:   Wt Readings from Last 1 Encounters:  06/10/17 140 lb 3.2 oz (63.6 kg)    Ideal Body Weight:  47.72 kg  BMI:  Body mass index is 26.49 kg/m.  Estimated Nutritional Needs:   Kcal:  1600-1900 calories  Protein:  89-102 grams  Fluid:  1.6-1.9L  Satira Anis. Joram Venson, MS, RD LDN Inpatient Clinical Dietitian Pager [phone]

## 2017-06-11 NOTE — Plan of Care (Signed)
  Clinical Measurements: Ability to maintain clinical measurements within normal limits will improve 06/11/2017 0117 - Progressing by Marylouise Stacks, RN   Clinical Measurements: Diagnostic test results will improve 06/11/2017 0117 - Progressing by Marylouise Stacks, RN   Clinical Measurements: Respiratory complications will improve 06/11/2017 0117 - Progressing by Marylouise Stacks, RN   Clinical Measurements: Cardiovascular complication will be avoided 06/11/2017 0117 - Progressing by Marylouise Stacks, RN

## 2017-06-11 NOTE — Progress Notes (Signed)
Pt refuses to take ct prep. She vomited large amount. Still nauseeated after receiving antiemetic.

## 2017-06-11 NOTE — Progress Notes (Signed)
[facility] NAME: Bianca Shaw    MR#:  [id number]  DATE OF BIRTH:  [birth year]  SUBJECTIVE:  CHIEF COMPLAINT:  No chief complaint on file.  - patient admitted with extreme weakness, noted to have low sodium of 124 - worse weakness in the last 3 weeks per husband, also proximal muscle weakness more  REVIEW OF SYSTEMS:  Review of Systems  Constitutional: Positive for malaise/fatigue. Negative for chills and fever.  HENT: Negative for congestion, ear discharge, hearing loss and nosebleeds.   Eyes: Negative for blurred vision and double vision.  Respiratory: Negative for cough, shortness of breath and wheezing.   Cardiovascular: Negative for chest pain and palpitations.  Gastrointestinal: Negative for abdominal pain, constipation, diarrhea, nausea and vomiting.  Genitourinary: Negative for dysuria.  Musculoskeletal: Positive for myalgias.  Neurological: Positive for weakness. Negative for dizziness, speech change, focal weakness, seizures and headaches.  Psychiatric/Behavioral: Negative for depression.    DRUG ALLERGIES:   Allergies  Allergen Reactions  . Furosemide     hives  . Pimenta Nausea And Vomiting    VITALS:  Blood pressure (!) 141/63, pulse 75, temperature 99.1 F (37.3 C), temperature source Oral, resp. rate 16, height 5\' 1"  (1.549 m), weight 63.6 kg (140 lb 3.2 oz), SpO2 97 %.  PHYSICAL EXAMINATION:  Physical Exam  GENERAL:  72 y.o.-year-old patient lying in the bed with no acute distress.  EYES: Pupils equal, round, reactive to light and accommodation. No scleral icterus. Extraocular muscles intact.  HEENT: Head atraumatic, normocephalic. Oropharynx and nasopharynx clear.  NECK:  Supple, no jugular venous distention. No thyroid enlargement, no tenderness.  LUNGS: Normal breath sounds bilaterally, no wheezing, rales,rhonchi or crepitation. No use of accessory muscles of respiration.  CARDIOVASCULAR: S1,  S2 normal. No murmurs, rubs, or gallops.  ABDOMEN: Soft, nontender, nondistended. Bowel sounds present. No organomegaly or mass.  EXTREMITIES: No pedal edema, cyanosis, or clubbing.  NEUROLOGIC: Cranial nerves II through XII are intact. Muscle strength 5/5 in all extremities. Sensation intact. Gait not checked. Global weakness noted.  PSYCHIATRIC: The patient is alert and oriented x 3.  SKIN: No obvious rash, lesion, or ulcer.    LABORATORY PANEL:   CBC Recent Labs  Lab 06/11/17 0630  WBC 6.7  HGB 10.3*  HCT 29.3*  PLT 268   ------------------------------------------------------------------------------------------------------------------  Chemistries  Recent Labs  Lab 06/10/17 1809 06/11/17 0630  NA 124*  124* 130*  K 3.6 3.3*  CL 89* 95*  CO2 26 25  GLUCOSE 104* 96  BUN 21* 17  CREATININE 1.25* 1.21*  CALCIUM 11.6* 11.7*  AST 25  --   ALT 36  --   ALKPHOS 58  --   BILITOT 0.4  --    ------------------------------------------------------------------------------------------------------------------  Cardiac Enzymes Recent Labs  Lab 06/04/17 1743  TROPONINI 0.03*   ------------------------------------------------------------------------------------------------------------------  RADIOLOGY:  No results found.  EKG:   Orders placed or performed during the hospital encounter of 06/04/17  . ED EKG  . ED EKG  . EKG    ASSESSMENT AND PLAN:    72 year old female with past medical history significant for left breast cancer status post mastectomy and chemotherapy, scleroderma, GERD, hypertension presents to hospital secondary to significant weakness and noted to have hyponatremia  1. 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 -Hyponatremia persistent in the last 2 months, did not improve with IV fluids during past admission. -Started on told back to him with improving sodium today. Sodium at 130  today - Likely tolvaptan  can be discontinued  tomorrow-management per nephrology -Patient has stopped taking her hydrochlorothiazide at least 3-4 weeks ago  2. Hypercalcemia-history of breast cancer noted. -Calcium levels have started to be elevated since last week. -Check PTH levels and ionized calcium levels. -CT of the chest was done last week-showed only a stable 8 mm right middle lobe granuloma. No other suspicion for malignancy noted. -Patient is going to have a CT abdomen done today. -as getting tolvaptan, not getting IV fluids. Monitor the levels carefully and get oncology involved if needed. Especially with h/o breast cancer - may be bone scan needed if other tests are negative  3. Hypokalemia-replaced  4. Generalized weakness-more pronounced weakness in the proximal muscles. Could be from hyponatremia- -Chek ANA panel to rule out autoimmune conditions. -Physical therapy consult did  5. History of left breast cancer-status post mastectomy, chemotherapy. Continue tamoxifen  6. Hypertension-on metoprolol, hydralazine   7. Anxiety-continue BuSpar  8. DVT prophylaxis-Lovenox  Physical therapy consulted     All the records are reviewed and case discussed with Care Management/Social Workerr. Management plans discussed with the patient, family and they are in agreement.  CODE STATUS: Full Code  TOTAL TIME TAKING CARE OF THIS PATIENT: 38 minutes.   POSSIBLE D/C IN 1-2 DAYS, DEPENDING ON CLINICAL CONDITION.   Gladstone Lighter M.D on 06/11/2017 at 3:20 PM  Between 7am to 6pm - Pager - [phone]  After 6pm go to [email or web address] - password [email or web address] [facility]  Office  [phone]  CC: Primary care physician; Jerrol Banana., MD

## 2017-06-11 NOTE — Progress Notes (Signed)
[facility]  ROUNDING NOTE   Subjective:  Patient sent in from the office. She has had recent hyponatremia as well as hypercalcemia. She has been periodically taking TUMS at home. She has known SIADH. Patient did respond very well to ADH antagonist.   Objective:  Vital signs in last 24 hours:  Temp:  [98 F (36.7 C)-99.1 F (37.3 C)] 99.1 F (37.3 C) (02/15 0811) Pulse Rate:  [64-75] 75 (02/15 0606) Resp:  [16-20] 16 (02/15 0811) BP: (131-168)/(63-77) 141/63 (02/15 0811) SpO2:  [95 %-100 %] 97 % (02/15 0811) Weight:  [63.6 kg (140 lb 3.2 oz)] 63.6 kg (140 lb 3.2 oz) (02/14 1753)  Weight change:  Filed Weights   06/10/17 1753  Weight: 63.6 kg (140 lb 3.2 oz)    Intake/Output: I/O last 3 completed shifts: In: -  Out: 400 [Urine:400]   Intake/Output this shift:  No intake/output data recorded.  Physical Exam: General: No acute distress  Head: Normocephalic, atraumatic. Moist oral mucosal membranes  Eyes: Anicteric  Neck: Supple, trachea midline  Lungs:  Clear to auscultation, normal effort  Heart: S1S2 no rubs  Abdomen:  Soft, nontender, bowel sounds present  Extremities:  peripheral edema.  Neurologic: Awake, alert, following commands  Skin: No lesions       Basic Metabolic Panel: Recent Labs  Lab 06/10/17 1809 06/11/17 0630  NA 124*  124* 130*  K 3.6 3.3*  CL 89* 95*  CO2 26 25  GLUCOSE 104* 96  BUN 21* 17  CREATININE 1.25* 1.21*  CALCIUM 11.6* 11.7*    Liver Function Tests: Recent Labs  Lab 06/10/17 1809  AST 25  ALT 36  ALKPHOS 58  BILITOT 0.4  PROT 6.2*  ALBUMIN 2.9*   No results for input(s): LIPASE, AMYLASE in the last 168 hours. No results for input(s): AMMONIA in the last 168 hours.  CBC: Recent Labs  Lab 06/10/17 1809 06/11/17 0630  WBC 6.9 6.7  HGB 9.5* 10.3*  HCT 27.5* 29.3*  MCV 98.5 97.1  PLT 264 268    Cardiac Enzymes: Recent Labs  Lab 06/04/17 1743 06/11/17 0630  CKTOTAL  --  12*  TROPONINI  0.03*  --     BNP: Invalid input(s): POCBNP  CBG: No results for input(s): GLUCAP in the last 168 hours.  Microbiology: No results found for this or any previous visit.  Coagulation Studies: No results for input(s): LABPROT, INR in the last 72 hours.  Urinalysis: No results for input(s): COLORURINE, LABSPEC, PHURINE, GLUCOSEU, HGBUR, BILIRUBINUR, KETONESUR, PROTEINUR, UROBILINOGEN, NITRITE, LEUKOCYTESUR in the last 72 hours.  Invalid input(s): APPERANCEUR    Imaging: Ct Abdomen Pelvis Wo Contrast  Result Date: 06/11/2017 CLINICAL DATA:  Nausea and vomiting daily for 3 weeks. Fever and cough for 2 weeks. Body aches. Weight loss. EXAM: CT ABDOMEN AND PELVIS WITHOUT CONTRAST TECHNIQUE: Multidetector CT imaging of the abdomen and pelvis was performed following the standard protocol without IV contrast. COMPARISON:  08/26/2012 abdominal CT.  No prior pelvic imaging. FINDINGS: Lower chest: Clear lung bases. Mild cardiomegaly. Right coronary artery atherosclerosis. Trace left pleural fluid. Tiny hiatal hernia. Hepatobiliary: Old granulomatous disease in the liver. Normal gallbladder, without biliary ductal dilatation. Pancreas: Hypoattenuation in the pancreatic head is likely due to fatty replacement. No pancreatic duct dilatation or acute inflammation. Spleen: Normal in size, without focal abnormality. Adrenals/Urinary Tract: Normal adrenal glands. 1.4 cm interpolar right renal cyst. Normal left kidney. The bladder is partially collapsed. Apparent anterior bladder wall thickening is at least partially  felt to be secondary. Example image 71/series 2. Stomach/Bowel: Normal remainder of the stomach. Scattered colonic diverticula. Normal terminal ileum and appendix. Normal small bowel. Vascular/Lymphatic: Aortic and branch vessel atherosclerosis. No abdominopelvic adenopathy. Reproductive: Central uterine hypoattenuation, including on sagittal image 50 and transverse image 63/series 2. No adnexal mass.  Other: Mild pelvic floor laxity. No significant free fluid. presacral and posterior pelvic edema, including on image 71/series 2. No adjacent rectal wall thickening. Musculoskeletal: Osteopenia. Degenerative disc disease including at the lumbosacral junction. Disc bulges at L3-4 and L4-5. IMPRESSION: 1.  No acute process in the abdomen or pelvis. 2. Possible bladder wall thickening, at least partially felt to be due to underdistention. Consider correlation with urinalysis to exclude cystitis. 3. Nonspecific presacral and posterior pelvic edema. This is of indeterminate acuity, as the pelvis is not previously imaged. No adjacent rectal wall thickening to confirm proctitis. 4. Central uterine hypoattenuation is subtle. Correlate with postmenopausal bleeding and possibly nonemergent outpatient pelvic ultrasound. 5. Pelvic floor laxity. 6. Coronary artery atherosclerosis. Aortic Atherosclerosis (ICD10-I70.0). 7.  Tiny hiatal hernia. 8. Trace left pleural fluid. Electronically Signed   By: Abigail Miyamoto M.D.   On: 06/11/2017 15:58     Medications:    . aspirin EC  81 mg Oral Daily  . busPIRone  5 mg Oral QHS  . chlorhexidine  15 mL Mouth Rinse BID  . clotrimazole  10 mg Oral QID  . enoxaparin (LOVENOX) injection  40 mg Subcutaneous Q24H  . feeding supplement (ENSURE ENLIVE)  237 mL Oral BID BM  . hydrALAZINE  25 mg Oral BID  . mouth rinse  15 mL Mouth Rinse q12n4p  . metoprolol succinate  25 mg Oral Daily  . pantoprazole  40 mg Oral Daily  . potassium chloride  20 mEq Oral Once  . sodium chloride flush  3 mL Intravenous Q12H  . tamoxifen  20 mg Oral Daily  . tolvaptan  15 mg Oral Q24H   acetaminophen **OR** acetaminophen, albuterol, ALPRAZolam, fluticasone, nitroGLYCERIN, ondansetron **OR** ondansetron (ZOFRAN) IV  Assessment/ Plan:  72 y.o. female with past medical history of left breast cancer status post left mastectomy 12/26/15, anxiety, hypertension, GERD, hyperlipidemia, allergic rhinitis,  history of acute idiopathic pericarditis who was referred for the evaluation management of hyponatremia.  1. CKD stage III/Proteinuria. 2. Hypertension. 3. Hyponatremia, Na up to 130 on tolvaptan.  4. History of right sided lung nodule, found to be granuloma. 5. Hypercalcemia, Ca 11.7, spep/upep negative, awaiting PTH/PTH related peptide.    Plan:  The patient's hyponatremia has improved with the use of ADH antagonist.  Maintain tolvaptan 15 mg by mouth daily.  Continue to monitor serum sodium.  Serum calcium remains high at 11.7.  Hesitant to give IV fluid hydration for this at the moment given ongoing hyponatremia.  Continue to monitor serum calcium.  SPEP and UPEP were negative in the office.  PTH and PTH related peptide are still pending.    LOS: 1 Bianca Shaw 2/15/20194:54 PM

## 2017-06-12 LAB — ANA COMPREHENSIVE PANEL
Anti JO-1: <0.2 AI (ref 0.0–0.9)
Centromere Ab Screen: <0.2 AI (ref 0.0–0.9)
Chromatin Ab SerPl-aCnc: <0.2 AI (ref 0.0–0.9)
RIBONUCLEIC PROTEIN: 2.9 AI — AB (ref 0.0–0.9)
SSB (La) (ENA) Antibody, IgG: <0.2 AI (ref 0.0–0.9)
Scleroderma (Scl-70) (ENA) Antibody, IgG: <0.2 AI (ref 0.0–0.9)
ds DNA Ab: 1 IU/mL (ref 0–9)

## 2017-06-12 LAB — PTH, INTACT AND CALCIUM
Calcium, Total (PTH): 11.6 mg/dL — ABNORMAL HIGH (ref 8.7–10.3)
PTH: 15 pg/mL (ref 15–65)

## 2017-06-12 LAB — BASIC METABOLIC PANEL
ANION GAP: 7 (ref 5–15)
BUN: 17 mg/dL (ref 6–20)
CHLORIDE: 95 mmol/L — AB (ref 101–111)
CO2: 28 mmol/L (ref 22–32)
Calcium: 10.9 mg/dL — ABNORMAL HIGH (ref 8.9–10.3)
Creatinine, Ser: 1.26 mg/dL — ABNORMAL HIGH (ref 0.44–1.00)
GFR calc Af Amer: 48 mL/min — ABNORMAL LOW (ref >60)
GFR, EST NON AFRICAN AMERICAN: 42 mL/min — AB (ref >60)
Glucose, Bld: 101 mg/dL — ABNORMAL HIGH (ref 65–99)
POTASSIUM: 3.4 mmol/L — AB (ref 3.5–5.1)
SODIUM: 130 mmol/L — AB (ref 135–145)

## 2017-06-12 LAB — CALCIUM, IONIZED: Calcium, Ionized, Serum: 6.7 mg/dL — ABNORMAL HIGH (ref 4.5–5.6)

## 2017-06-12 MED ORDER — POTASSIUM CHLORIDE CRYS ER 20 MEQ PO TBCR: 20.0000 meq | EXTENDED_RELEASE_TABLET | Freq: Every day | ORAL | 0 refills | Status: DC

## 2017-06-12 MED ORDER — FUROSEMIDE 20 MG PO TABS: 20.0000 mg | ORAL_TABLET | Freq: Every day | ORAL | 0 refills | Status: DC

## 2017-06-12 MED ORDER — FUROSEMIDE 20 MG PO TABS
20.0000 mg | ORAL_TABLET | Freq: Every day | ORAL | Status: DC
  Administered 2017-06-12: 20 mg via ORAL
  Filled 2017-06-12: qty 1

## 2017-06-12 MED ORDER — SODIUM CHLORIDE 1 G PO TABS
1.0000 g | ORAL_TABLET | Freq: Two times a day (BID) | ORAL | Status: DC
  Filled 2017-06-12: qty 1

## 2017-06-12 NOTE — Progress Notes (Signed)
Pt to be discharged this pm. Iv and tele removed. disch instructions and prescrips given to pt and husband. Discharged via w.c.

## 2017-06-12 NOTE — Evaluation (Signed)
Physical Therapy Evaluation Patient Details Name: Bianca Shaw MRN: [id number] DOB: 1946/01/21 Today's Date: 06/12/2017   History of Present Illness  Bianca Shaw is a 72yo white female who comes [facility] on 2/14 after 3 weeks of nausea and emesis, and 2 weeks of fever, cough, and myalgias. Family also reports weightloss and night sweats. Pt found to have hyponatremia. PMH: BrCA, GAD, Pericarditis, mild COPD, scleroderma, UTI, GERD, HTN, SIADH. AT baseline pt is a modified independent community sweller, independent in ADL,   Clinical Impression  Pt admitted with above diagnosis. Pt currently with functional limitations due to the deficits listed below (see "PT Problem List"). Upon entry, the patient is received up in chair with multiple family members present. The pt is awake and agreeable to participate. No acute distress noted at this time. Functional mobility assessment demonstrates moderate to heavy strength impairment in trunk and BLE, the pt now requiring moderate physical effort for transfers, with slow gait (0.76ms) and fatigue in legs after 811ft, whereas the patient performed these at a higher level of independence PTA. Empirically, the patient demonstrates increased risk of recurrent falls AEB gait speed <0.59m16m forward reach <5", and multiple LOB demonstrated throughout session. Pt will benefit from skilled PT intervention to increase independence and safety with basic mobility in preparation for discharge to the venue listed below.       Follow Up Recommendations Home health PT    Equipment Recommendations  Rolling walker with 5" wheels    Recommendations for Other Services       Precautions / Restrictions Precautions Precautions: None Restrictions Weight Bearing Restrictions: No      Mobility  Bed Mobility Overal bed mobility: Modified Independent                Transfers Overall transfer level: Needs assistance   Transfers: Sit to/from Stand Sit to  Stand: Supervision            Ambulation/Gait Ambulation/Gait assistance: Min guard Ambulation Distance (Feet): 220 Feet Assistive device: None   Gait velocity: 0.96m60mGait velocity interpretation: <1.8 ft/sec, indicative of risk for recurrent falls General Gait Details: flat foot strike with ~6-8-inch step length bilat, rigid trunk.   Stairs            Wheelchair Mobility    Modified Rankin (Stroke Patients Only)       Balance Overall balance assessment: Mild deficits observed, not formally tested;Modified Independent                                           Pertinent Vitals/Pain Pain Assessment: No/denies pain    Home Living Family/patient expects to be discharged to:: Private residence Living Arrangements: Spouse/significant other Available Help at Discharge: Family Type of Home: House Home Access: Stairs to enter   EntrCenter[employer] EnergySteps: 2 in front, 3 in back, no rails.  Home Layout: One level Home Equipment: Walker - standard;Bedside commode Additional Comments: was given a SW in the ED recently.     Prior Function Level of Independence: Independent with assistive device(s)         Comments: global weakness with high levels of effort for community distance AMB.      Hand Dominance        Extremity/Trunk Assessment                Communication   Communication:  No difficulties  Cognition Arousal/Alertness: Awake/alert Behavior During Therapy: WFL for tasks assessed/performed Overall Cognitive Status: Within Functional Limits for tasks assessed                                        General Comments      Exercises     Assessment/Plan    PT Assessment Patient needs continued PT services  PT Problem List Decreased strength;Decreased activity tolerance;Decreased balance;Decreased mobility;Pain       PT Treatment Interventions DME instruction;Balance training;Gait training;Stair  training;Functional mobility training;Therapeutic activities;Therapeutic exercise    PT Goals (Current goals can be found in the Care Plan section)  Acute Rehab PT Goals Patient Stated Goal: regain confidence in mobility  PT Goal Formulation: With patient Time For Goal Achievement: 06/26/17 Potential to Achieve Goals: Good    Frequency Min 2X/week   Barriers to discharge        Co-evaluation               AM-PAC PT "6 Clicks" Daily Activity  Outcome Measure Difficulty turning over in bed (including adjusting bedclothes, sheets and blankets)?: A Little Difficulty moving from lying on back to sitting on the side of the bed? : A Little Difficulty sitting down on and standing up from a chair with arms (e.g., wheelchair, bedside commode, etc,.)?: A Little Help needed moving to and from a bed to chair (including a wheelchair)?: A Little Help needed walking in hospital room?: A Little Help needed climbing 3-5 steps with a railing? : A Little 6 Click Score: 18    End of Session Equipment Utilized During Treatment: Gait belt Activity Tolerance: Patient tolerated treatment well;No increased pain;Patient limited by fatigue Patient left: in bed;with call bell/phone within reach;with family/visitor present Nurse Communication: Mobility status PT Visit Diagnosis: Unsteadiness on feet (R26.81);Difficulty in walking, not elsewhere classified (R26.2);Muscle weakness (generalized) (M62.81)    Time: 8546-2703 PT Time Calculation (min) (ACUTE ONLY): 18 min   Charges:   PT Evaluation $PT Eval Moderate Complexity: 1 Mod PT Treatments $Therapeutic Activity: 8-22 mins   PT G Codes:        2:15 PM, 06-24-2017 Etta Grandchild, PT, DPT Physical Therapist - [place] [phone] ([place])  [phone] (mobile)   Buccola,Allan C 06/24/2017, 2:12 PM

## 2017-06-12 NOTE — Discharge Summary (Signed)
[facility] [place] NAME: Bianca Shaw    MR#:  [id number]  DATE OF BIRTH:  November 03, 1945  DATE OF ADMISSION:  06/10/2017 ADMITTING PHYSICIAN: Henreitta Leber, MD  DATE OF DISCHARGE: 06/12/2017  PRIMARY CARE PHYSICIAN: Jerrol Banana., MD    ADMISSION DIAGNOSIS:  hyponatremia hypocalcemia  DISCHARGE DIAGNOSIS:  Active Problems:   Hyponatremia   SECONDARY DIAGNOSIS:   Past Medical History:  Diagnosis Date  . Anemia   . Anxiety disorder   . Breast cancer of upper-outer quadrant of left female breast ([place]) 11/2015   pT2 pN0(i+).;ER+; PR +, her 2 neu not overexpressed.  Mastectomy, SLN, Mammoprint: Low risk.   . Cancer ([place]) 12/03/2015   left breast/ INVASIVE LOBULAR CARCINOMA.   . Cough    lingering, mild, finished Prednisone and anitbiotic 11/03/15  . Family history of adverse reaction to anesthesia    sister - PONV  . GERD (gastroesophageal reflux disease)   . H/O: hysterectomy   . Hypertension   . Osteopenia   . Osteoporosis   . Pericarditis    diagnonsed June, 2010, unclear etiology as of yer  . Personal history of tobacco use, presenting hazards to health 10/31/2015  . Scleroderma (HCC)    ONLY ON SKIN-MILD  . UTI (lower urinary tract infection)   . Wears dentures    full upper    HOSPITAL COURSE:   72 year old female with past medical history significant for left breast cancer status post mastectomy and chemotherapy, scleroderma, GERD, hypertension presents to hospital secondary to significant weakness and noted to have hyponatremia  1. 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 -Hyponatremia persistent in the last 2 months, did not improve with IV fluids during past admission. -Started on told back to him with improving sodium today. Sodium at 130 today - management per nephrology - Patient has stopped taking her hydrochlorothiazide at least 3-4 weeks ago - Nephrologist as ordered labs for  malignancy and endocrine disorders, some of them are still pending but as her sodium is stable and she is feeling better he suggested to discharge and follow up in the clinic in next 1 week.  2. Hypercalcemia-history of breast cancer noted. -Calcium levels have started to be elevated since last week. -Check PTH levels and ionized calcium levels. -CT of the chest was done last week-showed only a stable 8 mm right middle lobe granuloma. No other suspicion for malignancy noted. - CT abdomen and pelvis does not have any suggestion of malignancy. -as getting tolvaptan, not getting IV fluids. Monitor the levels carefully and get oncology involved if needed. Especially with h/o breast cancer - Follow with nephrology as outpatient, sodium level is slightly lower today.  3. Hypokalemia-replaced  4. Generalized weakness-more pronounced weakness in the proximal muscles. Could be from hyponatremia- -Chek ANA panel to rule out autoimmune conditions. -Physical therapy consult did suggest home health on discharge.  5. History of left breast cancer-status post mastectomy, chemotherapy. Continue tamoxifen  6. Hypertension-on metoprolol, hydralazine   7. Anxiety-continue BuSpar  8. DVT prophylaxis-Lovenox   DISCHARGE CONDITIONS:   Stable.  CONSULTS OBTAINED:  Treatment Team:  Anthonette Legato, MD  DRUG ALLERGIES:   Allergies  Allergen Reactions  . Pimenta Nausea And Vomiting    DISCHARGE MEDICATIONS:   Allergies as of 06/12/2017      Reactions   Pimenta Nausea And Vomiting      Medication List    STOP taking these medications   amLODipine-olmesartan 10-40  MG tablet Commonly known as:  AZOR   hydrochlorothiazide 25 MG tablet Commonly known as:  HYDRODIURIL   isosorbide mononitrate 30 MG 24 hr tablet Commonly known as:  IMDUR   nystatin 100000 UNIT/ML suspension Commonly known as:  MYCOSTATIN   sulfamethoxazole-trimethoprim 800-160 MG tablet Commonly known as:  BACTRIM  DS,SEPTRA DS     TAKE these medications   albuterol (2.5 MG/3ML) 0.083% nebulizer solution Commonly known as:  PROVENTIL Take 3 mLs (2.5 mg total) by nebulization every 6 (six) hours as needed for wheezing or shortness of breath.   ALPRAZolam 0.5 MG tablet Commonly known as:  XANAX 1/2 tablet twice daily as needed What changed:    how much to take  how to take this  when to take this  reasons to take this  additional instructions   aspirin EC 81 MG tablet Take 81 mg by mouth daily.   busPIRone 5 MG tablet Commonly known as:  BUSPAR 1 Tablet, Oral QHS What changed:    how much to take  how to take this  when to take this  additional instructions   clotrimazole 10 MG troche Commonly known as:  MYCELEX Take 1 lozenge (10 mg total) by mouth 4 (four) times daily.   fluticasone 50 MCG/ACT nasal spray Commonly known as:  FLONASE Place 2 sprays into the nose daily as needed for allergies or rhinitis.   furosemide 20 MG tablet Commonly known as:  LASIX Take 1 tablet (20 mg total) by mouth daily. Start taking on:  06/13/2017   hydrALAZINE 25 MG tablet Commonly known as:  APRESOLINE Take 1 tablet (25 mg total) by mouth 2 (two) times daily.   metoprolol succinate 25 MG 24 hr tablet Commonly known as:  TOPROL-XL Take 1 tablet (25 mg total) by mouth daily.   nitroGLYCERIN 0.4 MG SL tablet Commonly known as:  NITROSTAT Place 1 tablet (0.4 mg total) under the tongue every 5 (five) minutes as needed for chest pain.   omeprazole 20 MG capsule Commonly known as:  PRILOSEC Take 20 mg by mouth daily as needed. For heartburn   ondansetron 4 MG tablet Commonly known as:  ZOFRAN Take 1 tablet (4 mg total) by mouth every 8 (eight) hours as needed for nausea or vomiting.   potassium chloride SA 20 MEQ tablet Commonly known as:  K-DUR,KLOR-CON Take 1 tablet (20 mEq total) by mouth daily.   simvastatin 40 MG tablet Commonly known as:  ZOCOR Take 1 tablet (40 mg total)  by mouth daily.   tamoxifen 20 MG tablet Commonly known as:  NOLVADEX Take 1 tablet (20 mg total) by mouth daily.            Durable Medical Equipment  (From admission, onward)        Start     Ordered   06/12/17 1438  For home use only DME Walker rolling  Once    Question:  Patient needs a walker to treat with the following condition  Answer:  Osteoporosis due to malabsorption   06/12/17 1441       DISCHARGE INSTRUCTIONS:    Follow with PMD and nephrology in 1 week,.  If you experience worsening of your admission symptoms, develop shortness of breath, life threatening emergency, suicidal or homicidal thoughts you must seek medical attention immediately by calling 911 or calling your MD immediately  if symptoms less severe.  You Must read complete instructions/literature along with all the possible adverse reactions/side effects for all the Medicines  you take and that have been prescribed to you. Take any new Medicines after you have completely understood and accept all the possible adverse reactions/side effects.   Please note  You were cared for by a hospitalist during your hospital stay. If you have any questions about your discharge medications or the care you received while you were in the hospital after you are discharged, you can call the unit and asked to speak with the hospitalist on call if the hospitalist that took care of you is not available. Once you are discharged, your primary care physician will handle any further medical issues. Please note that NO REFILLS for any discharge medications will be authorized once you are discharged, as it is imperative that you return to your primary care physician (or establish a relationship with a primary care physician if you do not have one) for your aftercare needs so that they can reassess your need for medications and monitor your lab values.    Today   CHIEF COMPLAINT:  No chief complaint on file.   HISTORY OF  PRESENT ILLNESS:  Bianca Shaw  is a 72 y.o. female with a known history of breast cancer, anxiety, history of pericarditis, recurrent urinary tract infection, scleroderma, GERD, essential hypertension who presents to the hospital for direct admission due to profound weakness and body aches and difficulty walking and noted to be hyponatremic.  As per the patient's husband patient has been feeling increasingly weak with on and off nausea vomiting and body aches for the past few months.  Patient was seen by her primary care physician and had a flu test checked which was negative, patient was then referred to nephrology who did some blood work and noted to her to be a bit hyponatremic.  Patient's medications were adjusted but patient continued to have persistent symptoms and therefore presented to the ER a few days back was noted to have a sodium of 124, patient was given some IV fluids and discharged home from the emergency room.  Patient continues to be significantly lethargic weak having some body aches and intermittent nausea vomiting and therefore went back to see a nephrologist who then referred her for direct admission due to symptomatic hyponatremia.   VITAL SIGNS:  Blood pressure (!) 131/55, pulse 69, temperature (!) 97.1 F (36.2 C), temperature source Axillary, resp. rate 16, height 5\' 1"  (1.549 m), weight 62.4 kg (137 lb 9.6 oz), SpO2 96 %.  I/O:    Intake/Output Summary (Last 24 hours) at 06/12/2017 1550 Last data filed at 06/12/2017 0500 Gross per 24 hour  Intake -  Output 800 ml  Net -800 ml    PHYSICAL EXAMINATION:  GENERAL:  72 y.o.-year-old patient lying in the bed with no acute distress.  EYES: Pupils equal, round, reactive to light and accommodation. No scleral icterus. Extraocular muscles intact.  HEENT: Head atraumatic, normocephalic. Oropharynx and nasopharynx clear.  NECK:  Supple, no jugular venous distention. No thyroid enlargement, no tenderness.  LUNGS: Normal breath  sounds bilaterally, no wheezing, rales,rhonchi or crepitation. No use of accessory muscles of respiration.  CARDIOVASCULAR: S1, S2 normal. No murmurs, rubs, or gallops.  ABDOMEN: Soft, non-tender, non-distended. Bowel sounds present. No organomegaly or mass.  EXTREMITIES: No pedal edema, cyanosis, or clubbing.  NEUROLOGIC: Cranial nerves II through XII are intact. Muscle strength 4/5 in all extremities. Sensation intact. Gait not checked.  PSYCHIATRIC: The patient is alert and oriented x 3.  SKIN: No obvious rash, lesion, or ulcer.   DATA REVIEW:  CBC Recent Labs  Lab 06/11/17 0630  WBC 6.7  HGB 10.3*  HCT 29.3*  PLT 268    Chemistries  Recent Labs  Lab 06/10/17 1809  06/12/17 0417  NA 124*  124*   < > 130*  K 3.6   < > 3.4*  CL 89*   < > 95*  CO2 26   < > 28  GLUCOSE 104*   < > 101*  BUN 21*   < > 17  CREATININE 1.25*   < > 1.26*  CALCIUM 11.6*   < > 10.9*  AST 25  --   --   ALT 36  --   --   ALKPHOS 58  --   --   BILITOT 0.4  --   --    < > = values in this interval not displayed.    Cardiac Enzymes No results for input(s): TROPONINI in the last 168 hours.  Microbiology Results  No results found for this or any previous visit.  RADIOLOGY:  Ct Abdomen Pelvis Wo Contrast  Result Date: 06/11/2017 CLINICAL DATA:  Nausea and vomiting daily for 3 weeks. Fever and cough for 2 weeks. Body aches. Weight loss. EXAM: CT ABDOMEN AND PELVIS WITHOUT CONTRAST TECHNIQUE: Multidetector CT imaging of the abdomen and pelvis was performed following the standard protocol without IV contrast. COMPARISON:  08/26/2012 abdominal CT.  No prior pelvic imaging. FINDINGS: Lower chest: Clear lung bases. Mild cardiomegaly. Right coronary artery atherosclerosis. Trace left pleural fluid. Tiny hiatal hernia. Hepatobiliary: Old granulomatous disease in the liver. Normal gallbladder, without biliary ductal dilatation. Pancreas: Hypoattenuation in the pancreatic head is likely due to fatty  replacement. No pancreatic duct dilatation or acute inflammation. Spleen: Normal in size, without focal abnormality. Adrenals/Urinary Tract: Normal adrenal glands. 1.4 cm interpolar right renal cyst. Normal left kidney. The bladder is partially collapsed. Apparent anterior bladder wall thickening is at least partially felt to be secondary. Example image 71/series 2. Stomach/Bowel: Normal remainder of the stomach. Scattered colonic diverticula. Normal terminal ileum and appendix. Normal small bowel. Vascular/Lymphatic: Aortic and branch vessel atherosclerosis. No abdominopelvic adenopathy. Reproductive: Central uterine hypoattenuation, including on sagittal image 50 and transverse image 63/series 2. No adnexal mass. Other: Mild pelvic floor laxity. No significant free fluid. presacral and posterior pelvic edema, including on image 71/series 2. No adjacent rectal wall thickening. Musculoskeletal: Osteopenia. Degenerative disc disease including at the lumbosacral junction. Disc bulges at L3-4 and L4-5. IMPRESSION: 1.  No acute process in the abdomen or pelvis. 2. Possible bladder wall thickening, at least partially felt to be due to underdistention. Consider correlation with urinalysis to exclude cystitis. 3. Nonspecific presacral and posterior pelvic edema. This is of indeterminate acuity, as the pelvis is not previously imaged. No adjacent rectal wall thickening to confirm proctitis. 4. Central uterine hypoattenuation is subtle. Correlate with postmenopausal bleeding and possibly nonemergent outpatient pelvic ultrasound. 5. Pelvic floor laxity. 6. Coronary artery atherosclerosis. Aortic Atherosclerosis (ICD10-I70.0). 7.  Tiny hiatal hernia. 8. Trace left pleural fluid. Electronically Signed   By: Abigail Miyamoto M.D.   On: 06/11/2017 15:58    EKG:   Orders placed or performed during the hospital encounter of 06/04/17  . ED EKG  . ED EKG  . EKG    Management plans discussed with the patient, family and they  are in agreement.  CODE STATUS:     Code Status Orders  (From admission, onward)        Start     Ordered  06/10/17 1708  Full code  Continuous     06/10/17 1708    Code Status History    Date Active Date Inactive Code Status Order ID Comments User Context   07/22/2016 14:20 07/22/2016 19:03 Full Code [id number]  Minna Merritts, MD Inpatient   07/16/2016 10:49 07/17/2016 16:51 Full Code [id number]  Bettey Costa, MD ED      TOTAL TIME TAKING CARE OF THIS PATIENT: 35 minutes.    Vaughan Basta M.D on 06/12/2017 at 3:50 PM  Between 7am to 6pm - Pager - [phone]  After 6pm go to [email or web address] - password [email or web address] [place] [facility]  Office  [phone]  CC: Primary care physician; Jerrol Banana., MD   Note: This dictation was prepared with Dragon dictation along with smaller phrase technology. Any transcriptional errors that result from this process are unintentional.

## 2017-06-12 NOTE — Progress Notes (Signed)
Pt. Slept well throughout the night with no c/o pain, SOB or acute distress noted. Will continue to monitor pt.

## 2017-06-12 NOTE — Care Management Note (Signed)
Case Management Note  Patient Details  Name: Bianca Shaw MRN: [id number] Date of Birth: 1945-12-09  Subjective/Objective:    Discussed discharge planning with [name] and her husband. They do not want to wait for a RW to be delivered to them at [facility] today. They want to use the standard walker they have at home until a RW can be shipped to them. A referral for a RW to be shipped to the home, and a referral for HH=PT per [name]'s request was sent to [place] [facility].                 Action/Plan:   Expected Discharge Date:  06/13/17               Expected Discharge Plan:  [place]  In-House Referral:     Discharge planning Services  CM Consult  Post Acute Care Choice:  Home Health Choice offered to:  Spouse, Patient  DME Arranged:  [facility] D[facility] Agency:  [place] Arranged:  PT [facility] Agency:  [place]  Status of Service:  Completed, signed off  If discussed at [place] of Stay Meetings, dates discussed:    Additional Comments:  Dvante Hands A, RN 06/12/2017, 2:42 PM

## 2017-06-16 ENCOUNTER — Other Ambulatory Visit: Payer: Self-pay | Admitting: Acute Care

## 2017-06-16 ENCOUNTER — Ambulatory Visit
Admission: RE | Admit: 2017-06-16 | Discharge: 2017-06-16 | Disposition: A | Discharge status: Home / Self Care | Payer: PPO | Source: Ambulatory Visit | Attending: Acute Care | Admitting: Acute Care

## 2017-06-16 DIAGNOSIS — R531 Weakness: Secondary | ICD-10-CM

## 2017-06-16 DIAGNOSIS — R269 Unspecified abnormalities of gait and mobility: Secondary | ICD-10-CM | POA: Diagnosis not present

## 2017-06-16 DIAGNOSIS — R2681 Unsteadiness on feet: Secondary | ICD-10-CM

## 2017-06-16 DIAGNOSIS — I6502 Occlusion and stenosis of left vertebral artery: Secondary | ICD-10-CM | POA: Insufficient documentation

## 2017-06-16 DIAGNOSIS — E871 Hypo-osmolality and hyponatremia: Secondary | ICD-10-CM | POA: Diagnosis not present

## 2017-06-16 DIAGNOSIS — R42 Dizziness and giddiness: Secondary | ICD-10-CM | POA: Diagnosis not present

## 2017-06-17 ENCOUNTER — Telehealth: Payer: Self-pay | Admitting: Family Medicine

## 2017-06-17 ENCOUNTER — Ambulatory Visit: Payer: PPO

## 2017-06-17 NOTE — Telephone Encounter (Signed)
Can you call him?

## 2017-06-17 NOTE — Telephone Encounter (Signed)
Ok will call

## 2017-06-17 NOTE — Telephone Encounter (Signed)
Pt's husband Mr. Adel is upset and seems very concerned and scared for his wife's health. Mr. Sherlin stated they have been to the hospital multiple times, seen a neurologist, and a kidney specialist and Mr. Farinas stated no one knows what is going on with pt. Mr. Andreason is requesting Dr. Rosanna Randy call him today. Mr. Lemke stated he didn't want to speak to anyone other than Dr. Rosanna Randy because he needs help to see what is going on with pt. He stated pt talks out of her head, he has to help her get to the restroom because she can't walk and he would feel better if he could speak with Dr. Rosanna Randy since Dr. Rosanna Randy has been treating pt for so many years. Please advise. Thanks TNP

## 2017-06-18 DIAGNOSIS — Z859 Personal history of malignant neoplasm, unspecified: Secondary | ICD-10-CM | POA: Diagnosis not present

## 2017-06-21 DIAGNOSIS — I10 Essential (primary) hypertension: Secondary | ICD-10-CM | POA: Diagnosis not present

## 2017-06-21 DIAGNOSIS — N183 Chronic kidney disease, stage 3 (moderate): Secondary | ICD-10-CM | POA: Diagnosis not present

## 2017-06-21 DIAGNOSIS — E871 Hypo-osmolality and hyponatremia: Secondary | ICD-10-CM | POA: Diagnosis not present

## 2017-06-22 ENCOUNTER — Observation Stay: Payer: PPO

## 2017-06-22 ENCOUNTER — Other Ambulatory Visit: Payer: Self-pay

## 2017-06-22 ENCOUNTER — Inpatient Hospital Stay
Admission: RE | Admit: 2017-06-22 | Discharge: 2017-06-25 | DRG: 641 | Disposition: A | Discharge status: Home / Self Care | Payer: PPO | Source: Ambulatory Visit | Attending: Internal Medicine | Admitting: Internal Medicine

## 2017-06-22 DIAGNOSIS — F419 Anxiety disorder, unspecified: Secondary | ICD-10-CM | POA: Diagnosis not present

## 2017-06-22 DIAGNOSIS — Z8249 Family history of ischemic heart disease and other diseases of the circulatory system: Secondary | ICD-10-CM

## 2017-06-22 DIAGNOSIS — K219 Gastro-esophageal reflux disease without esophagitis: Secondary | ICD-10-CM | POA: Diagnosis present

## 2017-06-22 DIAGNOSIS — Z7982 Long term (current) use of aspirin: Secondary | ICD-10-CM

## 2017-06-22 DIAGNOSIS — J439 Emphysema, unspecified: Secondary | ICD-10-CM | POA: Diagnosis present

## 2017-06-22 DIAGNOSIS — E222 Syndrome of inappropriate secretion of antidiuretic hormone: Secondary | ICD-10-CM | POA: Diagnosis not present

## 2017-06-22 DIAGNOSIS — Z7951 Long term (current) use of inhaled steroids: Secondary | ICD-10-CM

## 2017-06-22 DIAGNOSIS — E785 Hyperlipidemia, unspecified: Secondary | ICD-10-CM | POA: Diagnosis present

## 2017-06-22 DIAGNOSIS — Z818 Family history of other mental and behavioral disorders: Secondary | ICD-10-CM

## 2017-06-22 DIAGNOSIS — Z961 Presence of intraocular lens: Secondary | ICD-10-CM | POA: Diagnosis not present

## 2017-06-22 DIAGNOSIS — K5900 Constipation, unspecified: Secondary | ICD-10-CM | POA: Diagnosis not present

## 2017-06-22 DIAGNOSIS — Z825 Family history of asthma and other chronic lower respiratory diseases: Secondary | ICD-10-CM | POA: Diagnosis not present

## 2017-06-22 DIAGNOSIS — I129 Hypertensive chronic kidney disease with stage 1 through stage 4 chronic kidney disease, or unspecified chronic kidney disease: Secondary | ICD-10-CM | POA: Diagnosis present

## 2017-06-22 DIAGNOSIS — Z9012 Acquired absence of left breast and nipple: Secondary | ICD-10-CM

## 2017-06-22 DIAGNOSIS — R509 Fever, unspecified: Secondary | ICD-10-CM

## 2017-06-22 DIAGNOSIS — Z820 Family history of epilepsy and other diseases of the nervous system: Secondary | ICD-10-CM

## 2017-06-22 DIAGNOSIS — Z90710 Acquired absence of both cervix and uterus: Secondary | ICD-10-CM | POA: Diagnosis not present

## 2017-06-22 DIAGNOSIS — Z91018 Allergy to other foods: Secondary | ICD-10-CM

## 2017-06-22 DIAGNOSIS — L941 Linear scleroderma: Secondary | ICD-10-CM | POA: Diagnosis not present

## 2017-06-22 DIAGNOSIS — N179 Acute kidney failure, unspecified: Secondary | ICD-10-CM | POA: Diagnosis not present

## 2017-06-22 DIAGNOSIS — M810 Age-related osteoporosis without current pathological fracture: Secondary | ICD-10-CM | POA: Diagnosis present

## 2017-06-22 DIAGNOSIS — Z170 Estrogen receptor positive status [ER+]: Secondary | ICD-10-CM

## 2017-06-22 DIAGNOSIS — R112 Nausea with vomiting, unspecified: Secondary | ICD-10-CM | POA: Diagnosis not present

## 2017-06-22 DIAGNOSIS — Z809 Family history of malignant neoplasm, unspecified: Secondary | ICD-10-CM

## 2017-06-22 DIAGNOSIS — Z9841 Cataract extraction status, right eye: Secondary | ICD-10-CM | POA: Diagnosis not present

## 2017-06-22 DIAGNOSIS — R05 Cough: Secondary | ICD-10-CM | POA: Diagnosis not present

## 2017-06-22 DIAGNOSIS — Z79810 Long term (current) use of selective estrogen receptor modulators (SERMs): Secondary | ICD-10-CM

## 2017-06-22 DIAGNOSIS — D649 Anemia, unspecified: Secondary | ICD-10-CM | POA: Diagnosis not present

## 2017-06-22 DIAGNOSIS — D631 Anemia in chronic kidney disease: Secondary | ICD-10-CM | POA: Diagnosis present

## 2017-06-22 DIAGNOSIS — N183 Chronic kidney disease, stage 3 (moderate): Secondary | ICD-10-CM | POA: Diagnosis present

## 2017-06-22 DIAGNOSIS — Z841 Family history of disorders of kidney and ureter: Secondary | ICD-10-CM | POA: Diagnosis not present

## 2017-06-22 DIAGNOSIS — R809 Proteinuria, unspecified: Secondary | ICD-10-CM | POA: Diagnosis present

## 2017-06-22 DIAGNOSIS — C50412 Malignant neoplasm of upper-outer quadrant of left female breast: Secondary | ICD-10-CM | POA: Diagnosis not present

## 2017-06-22 DIAGNOSIS — Z888 Allergy status to other drugs, medicaments and biological substances status: Secondary | ICD-10-CM

## 2017-06-22 DIAGNOSIS — R5383 Other fatigue: Secondary | ICD-10-CM | POA: Diagnosis not present

## 2017-06-22 DIAGNOSIS — Z87891 Personal history of nicotine dependence: Secondary | ICD-10-CM | POA: Diagnosis not present

## 2017-06-22 DIAGNOSIS — I739 Peripheral vascular disease, unspecified: Secondary | ICD-10-CM | POA: Diagnosis not present

## 2017-06-22 DIAGNOSIS — M349 Systemic sclerosis, unspecified: Secondary | ICD-10-CM | POA: Diagnosis present

## 2017-06-22 DIAGNOSIS — I10 Essential (primary) hypertension: Secondary | ICD-10-CM | POA: Diagnosis not present

## 2017-06-22 DIAGNOSIS — Z853 Personal history of malignant neoplasm of breast: Secondary | ICD-10-CM

## 2017-06-22 DIAGNOSIS — Z8261 Family history of arthritis: Secondary | ICD-10-CM | POA: Diagnosis not present

## 2017-06-22 DIAGNOSIS — R531 Weakness: Secondary | ICD-10-CM | POA: Diagnosis not present

## 2017-06-22 DIAGNOSIS — E871 Hypo-osmolality and hyponatremia: Secondary | ICD-10-CM | POA: Diagnosis not present

## 2017-06-22 LAB — CBC
HEMATOCRIT: 24.7 % — AB (ref 35.0–47.0)
HEMOGLOBIN: 8.8 g/dL — AB (ref 12.0–16.0)
MCH: 34.3 pg — AB (ref 26.0–34.0)
MCHC: 35.7 g/dL (ref 32.0–36.0)
MCV: 96.2 fL (ref 80.0–100.0)
Platelets: 267 10*3/uL (ref 150–440)
RBC: 2.57 MIL/uL — ABNORMAL LOW (ref 3.80–5.20)
RDW: 12.7 % (ref 11.5–14.5)
WBC: 6.9 10*3/uL (ref 3.6–11.0)

## 2017-06-22 LAB — COMPREHENSIVE METABOLIC PANEL
ALBUMIN: 3.1 g/dL — AB (ref 3.5–5.0)
ALK PHOS: 41 U/L (ref 38–126)
ALT: 24 U/L (ref 14–54)
ANION GAP: 8 (ref 5–15)
AST: 19 U/L (ref 15–41)
BILIRUBIN TOTAL: 0.4 mg/dL (ref 0.3–1.2)
BUN: 22 mg/dL — ABNORMAL HIGH (ref 6–20)
CALCIUM: 12.9 mg/dL — AB (ref 8.9–10.3)
CO2: 25 mmol/L (ref 22–32)
CREATININE: 1.48 mg/dL — AB (ref 0.44–1.00)
Chloride: 95 mmol/L — ABNORMAL LOW (ref 101–111)
GFR calc non Af Amer: 34 mL/min — ABNORMAL LOW (ref >60)
GFR, EST AFRICAN AMERICAN: 40 mL/min — AB (ref >60)
GLUCOSE: 92 mg/dL (ref 65–99)
Potassium: 4.1 mmol/L (ref 3.5–5.1)
Sodium: 128 mmol/L — ABNORMAL LOW (ref 135–145)
TOTAL PROTEIN: 6.8 g/dL (ref 6.5–8.1)

## 2017-06-22 LAB — TSH: TSH: 1.605 u[IU]/mL (ref 0.350–4.500)

## 2017-06-22 LAB — PHOSPHORUS: PHOSPHORUS: 3.7 mg/dL (ref 2.5–4.6)

## 2017-06-22 LAB — MAGNESIUM: Magnesium: 1.4 mg/dL — ABNORMAL LOW (ref 1.7–2.4)

## 2017-06-22 MED ORDER — BUSPIRONE HCL 5 MG PO TABS
5.0000 mg | ORAL_TABLET | Freq: Every day | ORAL | Status: DC
  Administered 2017-06-22 – 2017-06-24 (×3): 5 mg via ORAL
  Filled 2017-06-22 (×4): qty 1

## 2017-06-22 MED ORDER — FLUTICASONE PROPIONATE 50 MCG/ACT NA SUSP
2.0000 | Freq: Every day | NASAL | Status: DC | PRN
  Filled 2017-06-22: qty 16

## 2017-06-22 MED ORDER — ONDANSETRON HCL 4 MG PO TABS: 4.0000 mg | ORAL_TABLET | Freq: Three times a day (TID) | ORAL | Status: DC | PRN

## 2017-06-22 MED ORDER — BISACODYL 10 MG RE SUPP: 10.0000 mg | Freq: Every day | RECTAL | Status: DC | PRN

## 2017-06-22 MED ORDER — CLOTRIMAZOLE 10 MG MT TROC
10.0000 mg | Freq: Four times a day (QID) | OROMUCOSAL | Status: DC
  Administered 2017-06-23 (×2): 10 mg via ORAL
  Filled 2017-06-22 (×6): qty 1

## 2017-06-22 MED ORDER — POLYETHYLENE GLYCOL 3350 17 G PO PACK: 17.0000 g | PACK | Freq: Every day | ORAL | Status: DC | PRN

## 2017-06-22 MED ORDER — ONDANSETRON HCL 4 MG/2ML IJ SOLN
4.0000 mg | Freq: Four times a day (QID) | INTRAMUSCULAR | Status: DC | PRN
  Administered 2017-06-24: 4 mg via INTRAVENOUS
  Filled 2017-06-22: qty 2

## 2017-06-22 MED ORDER — METOPROLOL SUCCINATE ER 25 MG PO TB24
25.0000 mg | ORAL_TABLET | Freq: Every day | ORAL | Status: DC
  Administered 2017-06-23 – 2017-06-24 (×2): 25 mg via ORAL
  Filled 2017-06-22 (×2): qty 1

## 2017-06-22 MED ORDER — SODIUM CHLORIDE 0.9% FLUSH
3.0000 mL | Freq: Two times a day (BID) | INTRAVENOUS | Status: DC
  Administered 2017-06-23 – 2017-06-24 (×3): 3 mL via INTRAVENOUS

## 2017-06-22 MED ORDER — SODIUM CHLORIDE 0.9% FLUSH: 3.0000 mL | INTRAVENOUS | Status: DC | PRN

## 2017-06-22 MED ORDER — FUROSEMIDE 20 MG PO TABS: 20.0000 mg | ORAL_TABLET | Freq: Every day | ORAL | Status: DC

## 2017-06-22 MED ORDER — POTASSIUM CHLORIDE CRYS ER 20 MEQ PO TBCR
20.0000 meq | EXTENDED_RELEASE_TABLET | Freq: Every day | ORAL | Status: DC
  Administered 2017-06-23 – 2017-06-25 (×3): 20 meq via ORAL
  Filled 2017-06-22 (×3): qty 1

## 2017-06-22 MED ORDER — SODIUM CHLORIDE 0.9 % IV SOLN
INTRAVENOUS | Status: AC
  Administered 2017-06-22 – 2017-06-23 (×2): via INTRAVENOUS

## 2017-06-22 MED ORDER — TAMOXIFEN CITRATE 10 MG PO TABS
20.0000 mg | ORAL_TABLET | Freq: Every day | ORAL | Status: DC
  Administered 2017-06-23 – 2017-06-25 (×3): 20 mg via ORAL
  Filled 2017-06-22 (×4): qty 2

## 2017-06-22 MED ORDER — NITROGLYCERIN 0.4 MG SL SUBL: 0.4000 mg | SUBLINGUAL_TABLET | SUBLINGUAL | Status: DC | PRN

## 2017-06-22 MED ORDER — ACETAMINOPHEN 325 MG PO TABS: 650.0000 mg | ORAL_TABLET | Freq: Four times a day (QID) | ORAL | Status: DC | PRN

## 2017-06-22 MED ORDER — ASPIRIN EC 81 MG PO TBEC
81.0000 mg | DELAYED_RELEASE_TABLET | Freq: Every day | ORAL | Status: DC
  Administered 2017-06-23 – 2017-06-25 (×3): 81 mg via ORAL
  Filled 2017-06-22 (×3): qty 1

## 2017-06-22 MED ORDER — SODIUM CHLORIDE 0.9 % IV SOLN: 250.0000 mL | INTRAVENOUS | Status: DC | PRN

## 2017-06-22 MED ORDER — HYDROCODONE-ACETAMINOPHEN 5-325 MG PO TABS: 1.0000 | ORAL_TABLET | ORAL | Status: DC | PRN

## 2017-06-22 MED ORDER — ACETAMINOPHEN 650 MG RE SUPP: 650.0000 mg | Freq: Four times a day (QID) | RECTAL | Status: DC | PRN

## 2017-06-22 MED ORDER — DOCUSATE SODIUM 100 MG PO CAPS
100.0000 mg | ORAL_CAPSULE | Freq: Two times a day (BID) | ORAL | Status: DC
  Administered 2017-06-23 – 2017-06-25 (×4): 100 mg via ORAL
  Filled 2017-06-22 (×5): qty 1

## 2017-06-22 MED ORDER — ONDANSETRON HCL 4 MG PO TABS: 4.0000 mg | ORAL_TABLET | Freq: Four times a day (QID) | ORAL | Status: DC | PRN

## 2017-06-22 MED ORDER — ALBUTEROL SULFATE (2.5 MG/3ML) 0.083% IN NEBU: 2.5000 mg | INHALATION_SOLUTION | Freq: Four times a day (QID) | RESPIRATORY_TRACT | Status: DC | PRN

## 2017-06-22 MED ORDER — ALPRAZOLAM 0.25 MG PO TABS: 0.2500 mg | ORAL_TABLET | Freq: Two times a day (BID) | ORAL | Status: DC | PRN

## 2017-06-22 MED ORDER — HEPARIN SODIUM (PORCINE) 5000 UNIT/ML IJ SOLN
5000.0000 [IU] | Freq: Three times a day (TID) | INTRAMUSCULAR | Status: DC
  Administered 2017-06-22 – 2017-06-25 (×9): 5000 [IU] via SUBCUTANEOUS
  Filled 2017-06-22 (×9): qty 1

## 2017-06-22 MED ORDER — HYDRALAZINE HCL 25 MG PO TABS
25.0000 mg | ORAL_TABLET | Freq: Two times a day (BID) | ORAL | Status: DC
  Administered 2017-06-22 – 2017-06-23 (×3): 25 mg via ORAL
  Filled 2017-06-22 (×3): qty 1

## 2017-06-22 MED ORDER — PANTOPRAZOLE SODIUM 40 MG PO TBEC
40.0000 mg | DELAYED_RELEASE_TABLET | Freq: Every day | ORAL | Status: DC
  Administered 2017-06-23 – 2017-06-25 (×3): 40 mg via ORAL
  Filled 2017-06-22 (×3): qty 1

## 2017-06-22 NOTE — Progress Notes (Signed)
[facility]  ROUNDING NOTE   Subjective:   [name] admitted to [facility] today from my office due to Hypercalcemia   Husband at bedside.   Symptomatic hypercalcemia with tremors, falls, muscle weakness, abdominal complaints   Objective:  Vital signs in last 24 hours:  Temp:  [97.9 F (36.6 C)] 97.9 F (36.6 C) (02/26 1628) Pulse Rate:  [68] 68 (02/26 1628) Resp:  [18] 18 (02/26 1628) BP: (160)/(59) 160/59 (02/26 1628) SpO2:  [98 %] 98 % (02/26 1628) Weight:  [62.1 kg (137 lb)] 62.1 kg (137 lb) (02/26 1628)  Weight change:  Filed Weights   06/22/17 1628  Weight: 62.1 kg (137 lb)    Intake/Output: No intake/output data recorded.   Intake/Output this shift:  No intake/output data recorded.  Physical Exam: General: NAD,   Head: Normocephalic, atraumatic. Moist oral mucosal membranes  Eyes: Anicteric, PERRL  Neck: Supple, trachea midline  Lungs:  Clear to auscultation  Heart: Regular rate and rhythm  Abdomen:  Soft, nontender,   Extremities: no peripheral edema.  Neurologic: +tremor  Skin: No lesions        Basic Metabolic Panel: Recent Labs  Lab 06/22/17 1724  NA 128*  K 4.1  CL 95*  CO2 25  GLUCOSE 92  BUN 22*  CREATININE 1.48*  CALCIUM 12.9*  MG 1.4*  PHOS 3.7    Liver Function Tests: Recent Labs  Lab 06/22/17 1724  AST 19  ALT 24  ALKPHOS 41  BILITOT 0.4  PROT 6.8  ALBUMIN 3.1*   No results for input(s): LIPASE, AMYLASE in the last 168 hours. No results for input(s): AMMONIA in the last 168 hours.  CBC: Recent Labs  Lab 06/22/17 1724  WBC 6.9  HGB 8.8*  HCT 24.7*  MCV 96.2  PLT 267    Cardiac Enzymes: No results for input(s): CKTOTAL, CKMB, CKMBINDEX, TROPONINI in the last 168 hours.  BNP: Invalid input(s): POCBNP  CBG: No results for input(s): GLUCAP in the last 168 hours.  Microbiology: No results found for this or any previous visit.  Coagulation Studies: No results for input(s): LABPROT,  INR in the last 72 hours.  Urinalysis: No results for input(s): COLORURINE, LABSPEC, PHURINE, GLUCOSEU, HGBUR, BILIRUBINUR, KETONESUR, PROTEINUR, UROBILINOGEN, NITRITE, LEUKOCYTESUR in the last 72 hours.  Invalid input(s): APPERANCEUR    Imaging: Dg Bone Survey Met  Result Date: 06/22/2017 CLINICAL DATA:  Hypercalcemia, weakness EXAM: METASTATIC BONE SURVEY COMPARISON:  Chest CT 06/04/2017. CT abdomen and pelvis 06/11/2017. Chest x-ray 05/24/2017. FINDINGS: Calcified granuloma in the right mid lung. Lungs otherwise clear. Heart is normal size. No effusions. Degenerative changes in the cervical, thoracic and lumbar spine with disc space narrowing and spurring. No focal bone lesion or acute bony abnormality. No focal lytic or sclerotic bone lesion within the visualized skeleton. No acute bony abnormality. Aortoiliac atherosclerosis. No visible aneurysm. IMPRESSION: No focal suspicious lytic or sclerotic bone lesion. No acute bony abnormality. Electronically Signed   By: Rolm Baptise M.D.   On: 06/22/2017 20:09     Medications:   . sodium chloride    . sodium chloride 125 mL/hr at 06/22/17 2047   . aspirin EC  81 mg Oral Daily  . busPIRone  5 mg Oral QHS  . clotrimazole  10 mg Oral QID  . docusate sodium  100 mg Oral BID  . furosemide  20 mg Oral Daily  . heparin  5,000 Units Subcutaneous Q8H  . hydrALAZINE  25 mg Oral BID  .  metoprolol succinate  25 mg Oral Daily  . [START ON 06/23/2017] pantoprazole  40 mg Oral Daily  . potassium chloride SA  20 mEq Oral Daily  . sodium chloride flush  3 mL Intravenous Q12H  . tamoxifen  20 mg Oral Daily   sodium chloride, acetaminophen **OR** acetaminophen, albuterol, ALPRAZolam, bisacodyl, fluticasone, HYDROcodone-acetaminophen, nitroGLYCERIN, ondansetron **OR** ondansetron (ZOFRAN) IV, ondansetron, polyethylene glycol, sodium chloride flush  Assessment/ Plan:  [name] is a 72 y.o. white female with history of breast cancer status  post left mastectomy, anxiety, hypertension, GERD, hyperlipidemia, allergic rhinitis, history of acute idiopathic pericarditis   1. Hypercalcemia: with PTH of 11, low PTH related peptide. Negative outpatient SPEP/UPEP. Normal TSH. Not on a thiazide diuretic. Denies any calcium/vitamin D or alkali ingestion.  - Agree with IV fluids - Continue furosemide.  - Check vitamin D level, free light chains. Also consider vitamin A level.  - Consult oncology.   2. Acute renal failure on Chronic Kidney Disease stage III with proteinuria: hypercalcemia causing renal failure, baseline creatinine of  1.26 Proteinuria could be due to hyperfiltration from hypercalcemia.   3. Hypertension: elevated on admission.  - furosemide as above.  - hydralazine, metoprolol  4. Hyponatremia: sodium down to 128.  - Continue to monitor with NS infusion   LOS: 1 Joaquin Knebel 2/26/20198:58 PM

## 2017-06-22 NOTE — Consult Note (Signed)
   [facility] CM Inpatient Consult   06/22/2017  LYDIANA MILLEY [birth year] [id number]    Referral received from [place] Management office due to [facility] trigger.   Chart reviewed. It does not appear patient is currently admitted to the hospital at this time.   Will make [place] Management office aware.    Marthenia Rolling, MSN-Ed, RN,BSN [facility] Liaison [phone]

## 2017-06-22 NOTE — H&P (Addendum)
[facility] NAME: Bianca Shaw    MR#:  [id number]  DATE OF BIRTH:  1946/03/25  DATE OF ADMISSION:  06/22/2017  PRIMARY CARE PHYSICIAN: Jerrol Banana., MD   REQUESTING/REFERRING PHYSICIAN:   CHIEF COMPLAINT:  No chief complaint on file.   HISTORY OF PRESENT ILLNESS: Bianca Shaw  is a 72 y.o. 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 Zollie Scale for hypercalcemia, patient evaluated on the floor, husband at the bedside, patient is without complaint, recently discharged from the hospital for hyponatremia with generalized weakness, patient now been admitted for acute on chronic hypercalcemia of unknown etiology.    PAST MEDICAL HISTORY:   Past Medical History:  Diagnosis Date  . Anemia   . Anxiety disorder   . Breast cancer of upper-outer quadrant of left female breast ([facility]) 11/2015   pT2 pN0(i+).;ER+; PR +, her 2 neu not overexpressed.  Mastectomy, SLN, Mammoprint: Low risk.   . Cancer ([facility]) 12/03/2015   left breast/ INVASIVE LOBULAR CARCINOMA.   . Cough    lingering, mild, finished Prednisone and anitbiotic 11/03/15  . Family history of adverse reaction to anesthesia    sister - PONV  . GERD (gastroesophageal reflux disease)   . H/O: hysterectomy   . Hypertension   . Osteopenia   . Osteoporosis   . Pericarditis    diagnonsed June, 2010, unclear etiology as of yer  . Personal history of tobacco use, presenting hazards to health 10/31/2015  . Scleroderma ([facility])    ONLY ON SKIN-MILD  . UTI (lower urinary tract infection)   . Wears dentures    full upper    PAST SURGICAL HISTORY:  Past Surgical History:  Procedure Laterality Date  . BREAST BIOPSY Right 2012   core - neg  . BREAST BIOPSY Left 12/03/2015   INVASIVE LOBULAR CARCINOMA.   Marland Kitchen CATARACT EXTRACTION W/ INTRAOCULAR LENS IMPLANT Right   .  COLONOSCOPY WITH PROPOFOL N/A 11/08/2015   Procedure: COLONOSCOPY WITH PROPOFOL;  Surgeon: Lucilla Lame, MD;  Location: [facility];  Service: Endoscopy;  Laterality: N/A;  . EVACUATION BREAST HEMATOMA Left 01/14/2016   Procedure: EVACUATION HEMATOMA BREAST;  Surgeon: Robert Bellow, MD;  Location: [facility];  Service: General;  Laterality: Left;  Marland Kitchen MASTECTOMY Left 2017   complete mastectomy  . MASTECTOMY W/ SENTINEL NODE BIOPSY Left 12/26/2015   Procedure: MASTECTOMY WITH SENTINEL LYMPH NODE BIOPSY;  Surgeon: Robert Bellow, MD;  Location: [facility];  Service: General;  Laterality: Left;  . RIGHT/LEFT HEART CATH AND CORONARY ANGIOGRAPHY N/A 07/22/2016   Procedure: Right/Left Heart Cath and Coronary Angiography;  Surgeon: Minna Merritts, MD;  Location: [facility];  Service: Cardiovascular;  Laterality: N/A;  . TUBAL LIGATION    . VESICOVAGINAL FISTULA CLOSURE W/ TAH      SOCIAL HISTORY:  Social History   Tobacco Use  . Smoking status: Former Smoker    Packs/day: 0.75    Years: 40.00    Pack years: 30.00    Types: Cigarettes    Last attempt to quit: 11/07/2003    Years since quitting: 13.6  . Smokeless tobacco: Never Used  Substance Use Topics  . Alcohol use: Yes    Alcohol/week: 0.6 - 1.8 oz    Types: 1 - 3 Glasses of wine per week    Comment: Occasionally.  FAMILY HISTORY:  Family History  Problem Relation Age of Onset  . Heart failure Mother   . Epilepsy Mother   . COPD Father   . Heart disease Father   . Anxiety disorder Sister   . Arthritis Brother   . Heart disease Brother   . Vaginal cancer Paternal Grandmother   . Heart attack Paternal Grandfather   . COPD Brother   . Kidney failure Brother   . COPD Brother   . Arthritis Sister   . Uterine cancer Unknown   . Diabetes Unknown   . Colon cancer Neg Hx   . Stomach cancer Neg Hx   . Breast cancer Neg Hx     DRUG ALLERGIES:  Allergies  Allergen Reactions  . Pimenta Nausea And Vomiting     REVIEW OF SYSTEMS:   CONSTITUTIONAL: No fever, fatigue or weakness.  EYES: No blurred or double vision.  EARS, NOSE, AND THROAT: No tinnitus or ear pain.  RESPIRATORY: No cough, shortness of breath, wheezing or hemoptysis.  CARDIOVASCULAR: No chest pain, orthopnea, edema.  GASTROINTESTINAL: No nausea, vomiting, diarrhea or abdominal pain.  GENITOURINARY: No dysuria, hematuria.  ENDOCRINE: No polyuria, nocturia,  HEMATOLOGY: No anemia, easy bruising or bleeding SKIN: No rash or lesion. MUSCULOSKELETAL: No joint pain or arthritis.   NEUROLOGIC: No tingling, numbness, weakness.  PSYCHIATRY: No anxiety or depression.   MEDICATIONS AT HOME:  Prior to Admission medications   Medication Sig Start Date End Date Taking? Authorizing Provider  albuterol (PROVENTIL) (2.5 MG/3ML) 0.083% nebulizer solution Take 3 mLs (2.5 mg total) by nebulization every 6 (six) hours as needed for wheezing or shortness of breath. 06/06/15   Mar Daring, PA-C  ALPRAZolam Duanne Moron) 0.5 MG tablet 1/2 tablet twice daily as needed Patient taking differently: Take 0.25 mg by mouth 2 (two) times daily as needed for anxiety.  10/13/16   Jerrol Banana., MD  aspirin EC 81 MG tablet Take 81 mg by mouth daily.    [provider]  busPIRone (BUSPAR) 5 MG tablet 1 Tablet, Oral QHS Patient taking differently: Take 5 mg by mouth at bedtime.  10/13/16   Jerrol Banana., MD  clotrimazole (MYCELEX) 10 MG troche Take 1 lozenge (10 mg total) by mouth 4 (four) times daily. 06/02/17   Jerrol Banana., MD  fluticasone [facility]) 50 MCG/ACT nasal spray Place 2 sprays into the nose daily as needed for allergies or rhinitis.     [provider]  furosemide (LASIX) 20 MG tablet Take 1 tablet (20 mg total) by mouth daily. 06/13/17   Vaughan Basta, MD  hydrALAZINE (APRESOLINE) 25 MG tablet Take 1 tablet (25 mg total) by mouth 2 (two) times daily. 04/29/17   Jerrol Banana., MD  metoprolol  succinate (TOPROL-XL) 25 MG 24 hr tablet Take 1 tablet (25 mg total) by mouth daily. 05/24/17   Jerrol Banana., MD  nitroGLYCERIN (NITROSTAT) 0.4 MG SL tablet Place 1 tablet (0.4 mg total) under the tongue every 5 (five) minutes as needed for chest pain. 07/17/16   Bettey Costa, MD  omeprazole (PRILOSEC) 20 MG capsule Take 20 mg by mouth daily as needed. For heartburn    [provider]  ondansetron (ZOFRAN) 4 MG tablet Take 1 tablet (4 mg total) by mouth every 8 (eight) hours as needed for nausea or vomiting. 06/02/17   Mar Daring, PA-C  potassium chloride SA (K-DUR,KLOR-CON) 20 MEQ tablet Take 1 tablet (20 mEq total) by mouth  daily. 06/12/17   Vaughan Basta, MD  tamoxifen (NOLVADEX) 20 MG tablet Take 1 tablet (20 mg total) by mouth daily. 06/09/16   Lequita Asal, MD      PHYSICAL EXAMINATION:   VITAL SIGNS: Blood pressure (!) 160/59, pulse 68, temperature 97.9 F (36.6 C), temperature source Oral, resp. rate 18, SpO2 98 %.  GENERAL:  72 y.o.-year-old patient lying in the bed with no acute distress.  EYES: Pupils equal, round, reactive to light and accommodation. No scleral icterus. Extraocular muscles intact.  HEENT: Head atraumatic, normocephalic. Oropharynx and nasopharynx clear.  NECK:  Supple, no jugular venous distention. No thyroid enlargement, no tenderness.  LUNGS: Normal breath sounds bilaterally, no wheezing, rales,rhonchi or crepitation. No use of accessory muscles of respiration.  CARDIOVASCULAR: S1, S2 normal. No murmurs, rubs, or gallops.  ABDOMEN: Soft, nontender, nondistended. Bowel sounds present. No organomegaly or mass.  EXTREMITIES: No pedal edema, cyanosis, or clubbing.  NEUROLOGIC: Cranial nerves II through XII are intact. Muscle strength 5/5 in all extremities. Sensation intact. Gait not checked.  PSYCHIATRIC: The patient is alert and oriented x 3.  SKIN: No obvious rash, lesion, or ulcer.   LABORATORY PANEL:   CBC No results  for input(s): WBC, HGB, HCT, PLT, MCV, MCH, MCHC, RDW, LYMPHSABS, MONOABS, EOSABS, BASOSABS, BANDABS in the last 168 hours.  Invalid input(s): NEUTRABS, BANDSABD ------------------------------------------------------------------------------------------------------------------  Chemistries  No results for input(s): NA, K, CL, CO2, GLUCOSE, BUN, CREATININE, CALCIUM, MG, AST, ALT, ALKPHOS, BILITOT in the last 168 hours.  Invalid input(s): GFRCGP ------------------------------------------------------------------------------------------------------------------ estimated creatinine clearance is 34.1 mL/min (A) (by C-G formula based on SCr of 1.26 mg/dL (H)). ------------------------------------------------------------------------------------------------------------------ No results for input(s): TSH, T4TOTAL, T3FREE, THYROIDAB in the last 72 hours.  Invalid input(s): FREET3   Coagulation profile No results for input(s): INR, PROTIME in the last 168 hours. ------------------------------------------------------------------------------------------------------------------- No results for input(s): DDIMER in the last 72 hours. -------------------------------------------------------------------------------------------------------------------  Cardiac Enzymes No results for input(s): CKMB, TROPONINI, MYOGLOBIN in the last 168 hours.  Invalid input(s): CK ------------------------------------------------------------------------------------------------------------------ Invalid input(s): POCBNP  ---------------------------------------------------------------------------------------------------------------  Urinalysis    Component Value Date/Time   COLORURINE YELLOW (A) 06/04/2017 2046   APPEARANCEUR HAZY (A) 06/04/2017 2046   LABSPEC 1.021 06/04/2017 2046   PHURINE 5.0 06/04/2017 2046   GLUCOSEU NEGATIVE 06/04/2017 2046   HGBUR NEGATIVE 06/04/2017 2046   Sunflower NEGATIVE 06/04/2017 2046    KETONESUR NEGATIVE 06/04/2017 2046   PROTEINUR NEGATIVE 06/04/2017 2046   NITRITE NEGATIVE 06/04/2017 2046   LEUKOCYTESUR NEGATIVE 06/04/2017 2046     RADIOLOGY: No results found.  EKG: Orders placed or performed during the hospital encounter of 06/04/17  . ED EKG  . ED EKG  . EKG    IMPRESSION AND PLAN: 1 acute on chronic hypercalcemia Etiology unknown -suspect possible occult malignancy Accepted as a direct admit from nephrology/Dr. Holley Raring, in discussion with nephrology-consult oncology, endocrinology-not available at our hospital, IV fluids for rehydration, has had workup for cancer which has been unfruitful, check PTH, parathyroid hormone related protein, SPEP, Bence-Jones protein, CMP, CBC, skeletal survey, and continue close medical monitoring -CT of the chest done recently shown stable 8 mm right middle lobe granuloma - CT abdomen and pelvis negative for malignancy medical monitoring  2 chronic SIADH 0.9 normal saline solution with fluids for rehydration, check admission lab work, tolvaptan, nephrology to see  3 history of breast cancer Status post mastectomy, on chemotherapy Continue tamoxifen  4 chronic GAD Coontinue Buspar and benzodiazepine  5 chronic GERD without esophagitis Stable PPI daily  6 chronic hyperlipidemia, unspecified Stable Continue statin therapy    All the records are reviewed and case discussed with ED provider. Management plans discussed with the patient, family and they are in agreement.  CODE STATUS:full Code Status History    Date Active Date Inactive Code Status Order ID Comments User Context   06/10/2017 17:09 06/12/2017 19:40 Full Code [id number]  Henreitta Leber, MD Inpatient   07/22/2016 14:20 07/22/2016 19:03 Full Code [id number]  Minna Merritts, MD Inpatient   07/16/2016 10:49 07/17/2016 16:51 Full Code [id number]  Bettey Costa, MD ED       TOTAL TIME TAKING CARE OF THIS PATIENT: 45 minutes.    Avel Peace Jelisa [place] M.D on  06/22/2017   Between 7am to 6pm - Pager - [phone]  After 6pm go to [email or web address] - password [email or web address] [facility]  Office  [phone]  CC: Primary care physician; Jerrol Banana., MD   Note: This dictation was prepared with Dragon dictation along with smaller phrase technology. Any transcriptional errors that result from this process are unintentional.

## 2017-06-22 NOTE — Consult Note (Signed)
   [facility] Inpatient Consult   06/22/2017  Bianca Shaw 1945/06/18 [id number]    [facility] Care Management follow up. Noted patient is now admitted to room 203 at [facility]. Will request that hospital liaison follow up at later time since patient just admitted.    Marthenia Rolling, MSN-Ed, RN,BSN [facility] Liaison [phone]

## 2017-06-23 ENCOUNTER — Encounter: Payer: Self-pay | Admitting: *Deleted

## 2017-06-23 DIAGNOSIS — E871 Hypo-osmolality and hyponatremia: Secondary | ICD-10-CM

## 2017-06-23 DIAGNOSIS — Z170 Estrogen receptor positive status [ER+]: Secondary | ICD-10-CM | POA: Diagnosis not present

## 2017-06-23 DIAGNOSIS — I739 Peripheral vascular disease, unspecified: Secondary | ICD-10-CM | POA: Diagnosis not present

## 2017-06-23 DIAGNOSIS — K5900 Constipation, unspecified: Secondary | ICD-10-CM

## 2017-06-23 DIAGNOSIS — D649 Anemia, unspecified: Secondary | ICD-10-CM | POA: Diagnosis not present

## 2017-06-23 DIAGNOSIS — R112 Nausea with vomiting, unspecified: Secondary | ICD-10-CM | POA: Diagnosis not present

## 2017-06-23 DIAGNOSIS — R5383 Other fatigue: Secondary | ICD-10-CM

## 2017-06-23 DIAGNOSIS — C50412 Malignant neoplasm of upper-outer quadrant of left female breast: Secondary | ICD-10-CM | POA: Diagnosis not present

## 2017-06-23 DIAGNOSIS — M349 Systemic sclerosis, unspecified: Secondary | ICD-10-CM | POA: Diagnosis not present

## 2017-06-23 DIAGNOSIS — Z87891 Personal history of nicotine dependence: Secondary | ICD-10-CM

## 2017-06-23 DIAGNOSIS — F419 Anxiety disorder, unspecified: Secondary | ICD-10-CM

## 2017-06-23 DIAGNOSIS — K219 Gastro-esophageal reflux disease without esophagitis: Secondary | ICD-10-CM | POA: Diagnosis not present

## 2017-06-23 DIAGNOSIS — Z853 Personal history of malignant neoplasm of breast: Secondary | ICD-10-CM

## 2017-06-23 DIAGNOSIS — I10 Essential (primary) hypertension: Secondary | ICD-10-CM

## 2017-06-23 DIAGNOSIS — Z7982 Long term (current) use of aspirin: Secondary | ICD-10-CM

## 2017-06-23 DIAGNOSIS — Z79899 Other long term (current) drug therapy: Secondary | ICD-10-CM

## 2017-06-23 DIAGNOSIS — Z90710 Acquired absence of both cervix and uterus: Secondary | ICD-10-CM

## 2017-06-23 LAB — URINALYSIS, ROUTINE W REFLEX MICROSCOPIC
Bacteria, UA: NONE SEEN
Bilirubin Urine: NEGATIVE
Glucose, UA: NEGATIVE mg/dL
Hgb urine dipstick: NEGATIVE
Ketones, ur: NEGATIVE mg/dL
Nitrite: NEGATIVE
PROTEIN: NEGATIVE mg/dL
Specific Gravity, Urine: 1.004 — ABNORMAL LOW (ref 1.005–1.030)
pH: 7 (ref 5.0–8.0)

## 2017-06-23 LAB — BASIC METABOLIC PANEL
ANION GAP: 5 (ref 5–15)
BUN: 20 mg/dL (ref 6–20)
CO2: 24 mmol/L (ref 22–32)
Calcium: 12.1 mg/dL — ABNORMAL HIGH (ref 8.9–10.3)
Chloride: 101 mmol/L (ref 101–111)
Creatinine, Ser: 1.37 mg/dL — ABNORMAL HIGH (ref 0.44–1.00)
GFR calc Af Amer: 43 mL/min — ABNORMAL LOW (ref >60)
GFR calc non Af Amer: 38 mL/min — ABNORMAL LOW (ref >60)
Glucose, Bld: 110 mg/dL — ABNORMAL HIGH (ref 65–99)
Potassium: 3.7 mmol/L (ref 3.5–5.1)
SODIUM: 130 mmol/L — AB (ref 135–145)

## 2017-06-23 LAB — RETICULOCYTES
RBC.: 2.41 MIL/uL — ABNORMAL LOW (ref 3.80–5.20)
Retic Count, Absolute: 26.5 10*3/uL (ref 19.0–183.0)
Retic Ct Pct: 1.1 % (ref 0.4–3.1)

## 2017-06-23 LAB — PARATHYROID HORMONE, INTACT (NO CA): PTH: 14 pg/mL — ABNORMAL LOW (ref 15–65)

## 2017-06-23 LAB — URIC ACID: URIC ACID, SERUM: 8.7 mg/dL — AB (ref 2.3–6.6)

## 2017-06-23 MED ORDER — PREMIER PROTEIN SHAKE
11.0000 [oz_av] | ORAL | Status: DC
  Administered 2017-06-24 – 2017-06-25 (×2): 11 [oz_av] via ORAL

## 2017-06-23 MED ORDER — FUROSEMIDE 40 MG PO TABS
40.0000 mg | ORAL_TABLET | Freq: Every day | ORAL | Status: DC
  Administered 2017-06-23 – 2017-06-25 (×3): 40 mg via ORAL
  Filled 2017-06-23 (×3): qty 1

## 2017-06-23 MED ORDER — PREMIER PROTEIN SHAKE
11.0000 [oz_av] | Freq: Two times a day (BID) | ORAL | Status: DC
  Administered 2017-06-23: 11 [oz_av] via ORAL

## 2017-06-23 MED ORDER — ADULT MULTIVITAMIN LIQUID CH
15.0000 mL | Freq: Every day | ORAL | Status: DC
  Administered 2017-06-23 – 2017-06-25 (×3): 15 mL via ORAL
  Filled 2017-06-23 (×3): qty 15

## 2017-06-23 NOTE — Care Management Obs Status (Signed)
[facility] NOTIFICATION   Patient Details  Name: Bianca Shaw MRN: [id number] Date of Birth: 25-Jun-1945   Medicare Observation Status Notification Given:  No(Patient not available to review obs letter at this time.  Will reattempt at a later time )    Beverly Sessions, RN 06/23/2017, 2:19 PM

## 2017-06-23 NOTE — Consult Note (Addendum)
[facility]  Date of admission:  06/22/2017  Inpatient day:  06/23/2017  Consulting physician:  Dr Holly Bodily Salary.  Reason for Consultation:  Hypercalcemia.  Chief Complaint: Bianca Shaw is a 72 y.o. female with a history of stage IIA left breast cancer who was admitted from the nephrology clinic with generalized weakness, nausea/vomiting, hyponatremia, and hypercalcemia.  HPI:  The patient was last seen in the medical oncology clinic on 04/09/2018.  At that time, she denied any complaint.  Exam was stable.  Weight was 149 pounds.  Hematocrit was 30.8 with a hemoglobin of 10.5.  Sodium was 126 (chronically low).  Albumen was 3.9.  Protein was 7.9.  Calcium was 9.2.  Ferritin, iron saturation, folate, and MMA were normal.  CA27.29 was 21.3 (normal).  She continued her tamoxifen.  Of note, the patient underwent a work-up of anemia on 12/08/2016.  Hematocrit was 28.7 with a hemoglobin of 10.1.  B12 was 299 (low normal).  SPEP revealed no monoclonal protein.  Free light chain ratio was 2.43 (0.26-1.65).   Her husband notes that she has been to the hospital 4 times since Gwynne Edinger Day (05/17/2017) with progressive deterioration.  She has had progressive weakness, poor oral intake with nausea and vomiting, fevers, and lower extremity weakness/stiffness and pain.  She has been followed closely by nephrology and recently neurology. She has an upcoming appointment with rheumatology.  Labs on 06/11/2017 revealed a + ribonucleic protein of 2.9 (0-0.9).  She has had several imaging studies without documentation of metastatic disease.  Chest CT on 06/04/2017 revealed no pneumonia, edema, collapse or effusion.  There was a right middle lobe calcified granuloma. There was a stable 8 mm scar posterior aspect of the right upper lobe.  Abdomen and pelvic CT on 06/11/2017 revealed no acute process in the abdomen or pelvis.  There was possible bladder wall thickening (? under  distention).  There was nonspecific presacral and posterior pelvic edema.  There was central uterine hypoattenuation (subtle).  Head MRI without contrast on 06/16/2017 revealed no acute intracranial abnormality.  There was wide spread changes c/w advanced small vessel disease.  Bone survey on 06/22/2017 revealed no focal lytic or sclerotic lesion.  She was admitted directly from Dr Elwyn Lade office.  Labs on admission revealed a sodium of 128, potassium 4.1, BUN 22, creatinine 1.48.  Albumen was 3.1.  Calcium was 12.9.  LFTs were normal with an alkaline phosphatase of 41.  Hematocrit was 24.7, hemoglobin 8.8, MCV 96.2, platelets 267,000, WBC 6900.  PTH was 14 (low).  TSH was normal.  Urinalysis was negative.   She has received IVF.  Labs today reveal a creatinine of 1.37.  Calcium was 12.1.   Past Medical History:  Diagnosis Date  . Anemia   . Anxiety disorder   . Breast cancer of upper-outer quadrant of left female breast ([place]) 11/2015   pT2 pN0(i+).;ER+; PR +, her 2 neu not overexpressed.  Mastectomy, SLN, Mammoprint: Low risk.   . Cancer ([place]) 12/03/2015   left breast/ INVASIVE LOBULAR CARCINOMA.   . Cough    lingering, mild, finished Prednisone and anitbiotic 11/03/15  . Family history of adverse reaction to anesthesia    sister - PONV  . GERD (gastroesophageal reflux disease)   . H/O: hysterectomy   . Hypertension   . Osteopenia   . Osteoporosis   . Pericarditis    diagnonsed June, 2010, unclear etiology as of yer  . Personal history of tobacco use, presenting hazards  to health 10/31/2015  . Scleroderma ([facility])    ONLY ON SKIN-MILD  . UTI (lower urinary tract infection)   . Wears dentures    full upper    Past Surgical History:  Procedure Laterality Date  . BREAST BIOPSY Right 2012   core - neg  . BREAST BIOPSY Left 12/03/2015   INVASIVE LOBULAR CARCINOMA.   Marland Kitchen CATARACT EXTRACTION W/ INTRAOCULAR LENS IMPLANT Right   . COLONOSCOPY WITH PROPOFOL N/A 11/08/2015   Procedure:  COLONOSCOPY WITH PROPOFOL;  Surgeon: Lucilla Lame, MD;  Location: [facility];  Service: Endoscopy;  Laterality: N/A;  . EVACUATION BREAST HEMATOMA Left 01/14/2016   Procedure: EVACUATION HEMATOMA BREAST;  Surgeon: Robert Bellow, MD;  Location: [facility];  Service: General;  Laterality: Left;  Marland Kitchen MASTECTOMY Left 2017   complete mastectomy  . MASTECTOMY W/ SENTINEL NODE BIOPSY Left 12/26/2015   Procedure: MASTECTOMY WITH SENTINEL LYMPH NODE BIOPSY;  Surgeon: Robert Bellow, MD;  Location: [facility];  Service: General;  Laterality: Left;  . RIGHT/LEFT HEART CATH AND CORONARY ANGIOGRAPHY N/A 07/22/2016   Procedure: Right/Left Heart Cath and Coronary Angiography;  Surgeon: Minna Merritts, MD;  Location: [facility];  Service: Cardiovascular;  Laterality: N/A;  . TUBAL LIGATION    . VESICOVAGINAL FISTULA CLOSURE W/ TAH      Family History  Problem Relation Age of Onset  . Heart failure Mother   . Epilepsy Mother   . COPD Father   . Heart disease Father   . Anxiety disorder Sister   . Arthritis Brother   . Heart disease Brother   . Vaginal cancer Paternal Grandmother   . Heart attack Paternal Grandfather   . COPD Brother   . Kidney failure Brother   . COPD Brother   . Arthritis Sister   . Uterine cancer Unknown   . Diabetes Unknown   . Colon cancer Neg Hx   . Stomach cancer Neg Hx   . Breast cancer Neg Hx     Social History:  reports that she quit smoking about 13 years ago. Her smoking use included cigarettes. She has a 30.00 pack-year smoking history. she has never used smokeless tobacco. She reports that she drinks about 0.6 - 1.8 oz of alcohol per week. She reports that she does not use drugs.  Allergies:  Allergies  Allergen Reactions  . Pimenta Nausea And Vomiting  . Tomato     Medications Prior to Admission  Medication Sig Dispense Refill  . albuterol (PROVENTIL) (2.5 MG/3ML) 0.083% nebulizer solution Take 3 mLs (2.5 mg total) by nebulization every 6  (six) hours as needed for wheezing or shortness of breath. 75 mL 12  . ALPRAZolam (XANAX) 0.5 MG tablet 1/2 tablet twice daily as needed (Patient taking differently: Take 0.25 mg by mouth 2 (two) times daily as needed for anxiety. ) 30 tablet 5  . aspirin EC 81 MG tablet Take 81 mg by mouth daily.    . busPIRone (BUSPAR) 5 MG tablet 1 Tablet, Oral QHS (Patient taking differently: Take 5 mg by mouth at bedtime. ) 30 tablet 12  . clotrimazole (MYCELEX) 10 MG troche Take 1 lozenge (10 mg total) by mouth 4 (four) times daily. 28 lozenge 0  . fluticasone (FLONASE) 50 MCG/ACT nasal spray Place 2 sprays into the nose daily as needed for allergies or rhinitis.     . furosemide (LASIX) 20 MG tablet Take 1 tablet (20 mg total) by mouth daily. 30 tablet 0  .  hydrALAZINE (APRESOLINE) 25 MG tablet Take 1 tablet (25 mg total) by mouth 2 (two) times daily. 60 tablet 11  . metoprolol succinate (TOPROL-XL) 25 MG 24 hr tablet Take 1 tablet (25 mg total) by mouth daily. 90 tablet 3  . nitroGLYCERIN (NITROSTAT) 0.4 MG SL tablet Place 1 tablet (0.4 mg total) under the tongue every 5 (five) minutes as needed for chest pain. 30 tablet 0  . omeprazole (PRILOSEC) 20 MG capsule Take 20 mg by mouth daily as needed. For heartburn    . ondansetron (ZOFRAN) 4 MG tablet Take 1 tablet (4 mg total) by mouth every 8 (eight) hours as needed for nausea or vomiting. 20 tablet 0  . potassium chloride SA (K-DUR,KLOR-CON) 20 MEQ tablet Take 1 tablet (20 mEq total) by mouth daily. 30 tablet 0  . tamoxifen (NOLVADEX) 20 MG tablet Take 1 tablet (20 mg total) by mouth daily. 90 tablet 3  . [DISCONTINUED] simvastatin (ZOCOR) 40 MG tablet Take 1 tablet (40 mg total) by mouth daily. (Patient not taking: Reported on 04/29/2017) 30 tablet 0    Review of Systems: GENERAL:  Generalized weakness.  Fevers.  One episode of drenching sweats. Weight loss of 20 pounds. PERFORMANCE STATUS (ECOG):  2 HEENT:  No visual changes, runny nose, sore throat,  mouth sores or tenderness. Lungs: No shortness of breath or cough.  No hemoptysis. Cardiac:  No chest pain, palpitations, orthopnea, or PND. GI:  Poor appetite.  Nausea and vomiting.  h/o diarrhea then constipation.  No melena or hematochezia. GU:  No urgency, frequency, dysuria, or hematuria. Musculoskeletal:  Legs weak and "stiff".  No muscle tenderness. Extremities:  Leg pain.  No swelling. Skin:  No rashes or skin changes. Neuro:  No headache, numbness or weakness, balance or coordination issues. Endocrine:  No diabetes, thyroid issues, hot flashes or night sweats. Psych:  No mood changes, depression or anxiety. Pain:  Leg pain. Review of systems:  All other systems reviewed and found to be negative.  Physical Exam:  Blood pressure (!) 164/79, pulse 70, temperature 98.1 F (36.7 C), temperature source Oral, resp. rate 18, height '5\' 1"'  (1.549 m), weight 137 lb (62.1 kg), SpO2 96 %.  GENERAL:  Fatigued appearing woman sitting up comfortably on the medical unit eating breakfast in no acute distress. MENTAL STATUS:  Alert and oriented to person, place and time. HEAD:  Pearline Cables hair.  Normocephalic, atraumatic, face symmetric, no Cushingoid features. EYES:  Blue eyes.  Pupils equal round and reactive to light and accomodation.  No conjunctivitis or scleral icterus. ENT:  Oropharynx clear without lesion.  Tongue normal. Mucous membranes moist.  RESPIRATORY:  Clear to auscultation without rales, wheezes or rhonchi. CARDIOVASCULAR:  Regular rate and rhythm without murmur, rub or gallop. ABDOMEN:  Soft, non-tender, with active bowel sounds, and no hepatosplenomegaly.  No masses. SKIN:  No rashes, ulcers or lesions. EXTREMITIES: No edema, no skin discoloration or tenderness.  No palpable cords. LYMPH NODES: No palpable cervical, supraclavicular, axillary or inguinal adenopathy  NEUROLOGICAL: Lower extremity strength symmetric.  Sensation intact. PSYCH:  Appropriate.   Results for orders placed  or performed during the hospital encounter of 06/22/17 (from the past 48 hour(s))  CBC     Status: Abnormal   Collection Time: 06/22/17  5:24 PM  Result Value Ref Range   WBC 6.9 3.6 - 11.0 K/uL   RBC 2.57 (L) 3.80 - 5.20 MIL/uL   Hemoglobin 8.8 (L) 12.0 - 16.0 g/dL   HCT 24.7 (L) 35.0 -  47.0 %   MCV 96.2 80.0 - 100.0 fL   MCH 34.3 (H) 26.0 - 34.0 pg   MCHC 35.7 32.0 - 36.0 g/dL   RDW 12.7 11.5 - 14.5 %   Platelets 267 150 - 440 K/uL    Comment: Performed at [facility], [address] [place], [place] [address]  Comprehensive metabolic panel     Status: Abnormal   Collection Time: 06/22/17  5:24 PM  Result Value Ref Range   Sodium 128 (L) 135 - 145 mmol/L   Potassium 4.1 3.5 - 5.1 mmol/L   Chloride 95 (L) 101 - 111 mmol/L   CO2 25 22 - 32 mmol/L   Glucose, Bld 92 65 - 99 mg/dL   BUN 22 (H) 6 - 20 mg/dL   Creatinine, Ser 1.48 (H) 0.44 - 1.00 mg/dL   Calcium 12.9 (H) 8.9 - 10.3 mg/dL   Total Protein 6.8 6.5 - 8.1 g/dL   Albumin 3.1 (L) 3.5 - 5.0 g/dL   AST 19 15 - 41 U/L   ALT 24 14 - 54 U/L   Alkaline Phosphatase 41 38 - 126 U/L   Total Bilirubin 0.4 0.3 - 1.2 mg/dL   GFR calc non Af Amer 34 (L) >60 mL/min   GFR calc Af Amer 40 (L) >60 mL/min    Comment: (NOTE) The eGFR has been calculated using the CKD EPI equation. This calculation has not been validated in all clinical situations. eGFR's persistently <60 mL/min signify possible Chronic Kidney Disease.    Anion gap 8 5 - 15    Comment: Performed at [facility], [address] [place], [place] [address]  Magnesium     Status: Abnormal   Collection Time: 06/22/17  5:24 PM  Result Value Ref Range   Magnesium 1.4 (L) 1.7 - 2.4 mg/dL    Comment: Performed at [facility], [address] [place], [place] [address]  Phosphorus     Status: None   Collection Time: 06/22/17  5:24 PM  Result Value Ref Range   Phosphorus 3.7 2.5 - 4.6 mg/dL    Comment: Performed at [facility], [address] [place], [place] [address]  TSH     Status: None   Collection Time: 06/22/17  5:24 PM  Result Value Ref Range   TSH 1.605 0.350 - 4.500 uIU/mL    Comment: Performed by a 3rd Generation assay with a functional sensitivity of <=0.01 uIU/mL. Performed at [facility], [address] [place], [place] [address]   Urinalysis, Routine w reflex microscopic     Status: Abnormal   Collection Time: 06/23/17  4:27 AM  Result Value Ref Range   Color, Urine YELLOW (A) YELLOW   APPearance HAZY (A) CLEAR   Specific Gravity, Urine 1.004 (L) 1.005 - 1.030   pH 7.0 5.0 - 8.0   Glucose, UA NEGATIVE NEGATIVE mg/dL   Hgb urine dipstick NEGATIVE NEGATIVE   Bilirubin Urine NEGATIVE NEGATIVE   Ketones, ur NEGATIVE NEGATIVE mg/dL   Protein, ur NEGATIVE NEGATIVE mg/dL   Nitrite NEGATIVE NEGATIVE   Leukocytes, UA TRACE (A) NEGATIVE   RBC / HPF 0-5 0 - 5 RBC/hpf   WBC, UA 6-30 0 - 5 WBC/hpf   Bacteria, UA NONE SEEN NONE SEEN   Squamous Epithelial / LPF 0-5 (A) NONE SEEN    Comment: Performed at [facility], [address] [place], [place] [address]  Basic metabolic panel     Status: Abnormal  Collection Time: 06/23/17  4:35 AM  Result Value Ref Range   Sodium 130 (L) 135 - 145 mmol/L   Potassium 3.7 3.5 - 5.1 mmol/L   Chloride 101 101 - 111 mmol/L   CO2 24 22 - 32 mmol/L   Glucose, Bld 110 (H) 65 - 99 mg/dL   BUN 20 6 - 20 mg/dL   Creatinine, Ser 1.37 (H) 0.44 - 1.00 mg/dL   Calcium 12.1 (H) 8.9 - 10.3 mg/dL   GFR calc non Af Amer 38 (L) >60 mL/min   GFR calc Af Amer 43 (L) >60 mL/min    Comment: (NOTE) The eGFR has been calculated using the CKD EPI equation. This calculation has not been validated in all clinical situations. eGFR's persistently <60 mL/min signify possible Chronic Kidney Disease.    Anion gap 5 5 - 15    Comment: Performed at [facility], [address] [place], [place] [address]   Dg Bone Survey Met  Result Date:  06/22/2017 CLINICAL DATA:  Hypercalcemia, weakness EXAM: METASTATIC BONE SURVEY COMPARISON:  Chest CT 06/04/2017. CT abdomen and pelvis 06/11/2017. Chest x-ray 05/24/2017. FINDINGS: Calcified granuloma in the right mid lung. Lungs otherwise clear. Heart is normal size. No effusions. Degenerative changes in the cervical, thoracic and lumbar spine with disc space narrowing and spurring. No focal bone lesion or acute bony abnormality. No focal lytic or sclerotic bone lesion within the visualized skeleton. No acute bony abnormality. Aortoiliac atherosclerosis. No visible aneurysm. IMPRESSION: No focal suspicious lytic or sclerotic bone lesion. No acute bony abnormality. Electronically Signed   By: Rolm Baptise M.D.   On: 06/22/2017 20:09    Assessment:  The patient is a 72 y.o. woman with stage IIA left breast cancer s/p mastectomy with sentinel lymph node biopsy on 12/26/2015.   Tumor was ER positive (> 90%), PR positive (> 90%), and Her2/neu negative by FISH.  MammaPrint testing revealed low risk luminal type A. There was a 97.8% probability of being disease free at 10 years with hormonal therapy.  She has been on tamoxifen since 02/04/2016.  CA27.29 was normal on 04/09/2017.  She has had numerous imaging studies without documentation of metastatic disease in 05/2017 (chest CT, abdomen and pelvic CT, head MRI, and bone survey).  She has osteoporosis and is on Fosamax.  She has had a normocytic anemia of unclear etiology.  Work-up on 12/08/2016 and 04/09/2017 revealed a low normal B12 with normal MMA thus r/o B12 deficiency.  SPEP revealed no monoclonal protein.  Free light chain ratio was 2.43 (0.26-1.65), unclear significance. Ferritin, iron saturation, and folate were normal.  TSH is normal.  Retic was 1.2% (inappropriately low) on 09/07/2016.  Coombs was negative on 10/19/2016.   She has a history of chronic hyponatremia (dating back to 07/2012).  She has scleroderma.  She was admitted with weakness,  nausea/vomiting, hyponatremia and hypercalcemia.  Symptomatically, she is fatigued.  She describes her legs being weak and achy.  Exam reveals no adenopathy or hepatosplenomegaly.  CK is normal.  Plan:   1.  Oncology:  Patient with a history of breast cancer on tamoxifen.  No current evidence of metastatic disease.  Prior work-up for myeloma revealed a normal SPEP and slightly elevated free light chains (unclear significance).  Agree with repeating studies and obtaining a 24 hour urine for UPEP and free light chains.  2.  Hematology:  Patient has a normocytic anemia of unclear significance.  Reticulocyte count in 08/2016 was inappropriately low.  Repeat with  this admission as well as DAT.  Patient with + ANA (ribonucleic protein) of unclear significance.  Patient scheduled to see rheumatology as outpatient.  Consider inpatient consult.  If etiology remains unclear, may need bone marrow.  3.  Fluids/Electrolyes/Nutrition:  Patient with hyponatremia since 2014.  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  Check uric acid and AM cortisol.    Patient with new hypercalcemia.  TSH is normal.  Patient denies excess vitamin A and vitamin D intake.  PTH low and not c/w primary hyperparathyroidism.  Await results from PTH-rp (if elevated would be c/w hypercalcemia of malignancy and would need a PET scan at that point given negative CT scans), 1,25 dihydroxy-vitamin D (if elevated would be c/w granulomatous disease or possibly lymphoma), and 25-hydroxyvitamin D.  Appreciate nephrology input.   Thank you for allowing me to participate in ALIXANDRIA FRIEDT 's care.  I will follow her closely with you while hospitalized and after discharge in the outpatient department.   Lequita Asal, MD  06/23/2017, 7:10 AM

## 2017-06-23 NOTE — Progress Notes (Signed)
[place] [facility] [place] NAME: Bianca Shaw    MR#:  [id number]  DATE OF BIRTH:  Nov 11, 1945  SUBJECTIVE:  CHIEF COMPLAINT:  No chief complaint on file.  - Patient came in with recurrent hypercalcemia. Calcium levels improving with fluids. Complaints of body aches and weakness.  REVIEW OF SYSTEMS:  Review of Systems  Constitutional: Positive for malaise/fatigue. Negative for chills and fever.  HENT: Negative for congestion, ear discharge, hearing loss and nosebleeds.   Eyes: Negative for blurred vision and double vision.  Respiratory: Negative for cough, shortness of breath and wheezing.   Cardiovascular: Negative for chest pain, palpitations and leg swelling.  Gastrointestinal: Negative for abdominal pain, constipation, diarrhea, nausea and vomiting.  Genitourinary: Negative for dysuria.  Musculoskeletal: Positive for joint pain and myalgias.  Neurological: Positive for weakness. Negative for dizziness, speech change, focal weakness, seizures and headaches.  Psychiatric/Behavioral: Negative for depression.    DRUG ALLERGIES:   Allergies  Allergen Reactions  . Pimenta Nausea And Vomiting  . Tomato     VITALS:  Blood pressure (!) 157/85, pulse 66, temperature 98.1 F (36.7 C), temperature source Oral, resp. rate 18, height '5\' 1"'  (1.549 m), weight 62.1 kg (137 lb), SpO2 96 %.  PHYSICAL EXAMINATION:  Physical Exam  GENERAL:  72 y.o.-year-old patient lying in the bed with no acute distress.  EYES: Pupils equal, round, reactive to light and accommodation. No scleral icterus. Extraocular muscles intact.  HEENT: Head atraumatic, normocephalic. Oropharynx and nasopharynx clear.  NECK:  Supple, no jugular venous distention. No thyroid enlargement, no tenderness.  LUNGS: Normal breath sounds bilaterally, no wheezing, rales,rhonchi or crepitation. No use of accessory muscles of respiration. Decreased bibasilar breath  sounds CARDIOVASCULAR: S1, S2 normal. No  rubs, or gallops. 2/6 systolic murmur is present ABDOMEN: Soft, nontender, nondistended. Bowel sounds present. No organomegaly or mass.  EXTREMITIES: No pedal edema, cyanosis, or clubbing.  NEUROLOGIC: Cranial nerves II through XII are intact. Muscle strength 5/5 in all extremities. Sensation intact. Gait not checked. Global weakness noted PSYCHIATRIC: The patient is alert and oriented x 3.  SKIN: No obvious rash, lesion, or ulcer.    LABORATORY PANEL:   CBC Recent Labs  Lab 06/22/17 1724  WBC 6.9  HGB 8.8*  HCT 24.7*  PLT 267   ------------------------------------------------------------------------------------------------------------------  Chemistries  Recent Labs  Lab 06/22/17 1724 06/23/17 0435  NA 128* 130*  K 4.1 3.7  CL 95* 101  CO2 25 24  GLUCOSE 92 110*  BUN 22* 20  CREATININE 1.48* 1.37*  CALCIUM 12.9* 12.1*  MG 1.4*  --   AST 19  --   ALT 24  --   ALKPHOS 41  --   BILITOT 0.4  --    ------------------------------------------------------------------------------------------------------------------  Cardiac Enzymes No results for input(s): TROPONINI in the last 168 hours. ------------------------------------------------------------------------------------------------------------------  RADIOLOGY:  Dg Bone Survey Met  Result Date: 06/22/2017 CLINICAL DATA:  Hypercalcemia, weakness EXAM: METASTATIC BONE SURVEY COMPARISON:  Chest CT 06/04/2017. CT abdomen and pelvis 06/11/2017. Chest x-ray 05/24/2017. FINDINGS: Calcified granuloma in the right mid lung. Lungs otherwise clear. Heart is normal size. No effusions. Degenerative changes in the cervical, thoracic and lumbar spine with disc space narrowing and spurring. No focal bone lesion or acute bony abnormality. No focal lytic or sclerotic bone lesion within the visualized skeleton. No acute bony abnormality. Aortoiliac atherosclerosis. No visible aneurysm. IMPRESSION: No  focal suspicious lytic or sclerotic bone lesion. No acute bony abnormality. Electronically Signed  By: Rolm Baptise M.D.   On: 06/22/2017 20:09    EKG:   Orders placed or performed during the hospital encounter of 06/04/17  . ED EKG  . ED EKG  . 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  1. Hypercalcemia-subacute going on for almost 4 weeks now - Unknown cause at this time. Likely secondary due to PTH levels are low. PTH related peptide levels are pending -TSH is within normal limits. Myeloma workup was negative as outpatient. -Appreciate nephrology and oncology consults. -Skeletal survey did not show any abnormalities. Head CT of the chest and abdomen done last admission 2 weeks ago which did not show any signs of malignancy. -Unsure if that is occult malignancy. Currently chest pain fluids and Lasix -Known history of breast cancer but has been in remission for more than anemia. Tumor markers have been within normal limits. -Autoimmune workup has been negative as well. -We will discuss with rheumatology to see if there is any other possibilities. Would have been nice to have endocrinology for Inpatient consults.  2. Chronic hyponatremia-secondary to SIADH. Sodium is at 130. Continue fluids  3. History of breast cancer-finished chemotherapy, status post mastectomy, in remission for more than anemia. Continue tamoxifen -Appreciate oncology consult  4. Weakness-secondary to hyponatremia and hypercalcemia. Seen neurology also as outpatient. MRI of the brain did not show any acute findings.  5. Hypertension-on metoprolol and hydralazine  6. DVT prophylaxis has been on subcutaneous heparin     All the records are reviewed and case discussed with Care  Management/Social Workerr. Management plans discussed with the patient, family and they are in agreement.  CODE STATUS: Full code  TOTAL TIME TAKING CARE OF THIS PATIENT: 38 minutes.   POSSIBLE D/C IN 2-3 DAYS, DEPENDING ON CLINICAL CONDITION.   Gladstone Lighter M.D on 06/23/2017 at 12:03 PM  Between 7am to 6pm - Pager - [phone]  After 6pm go to [email or web address] - password [email or web address] [facility]  Office  [phone]  CC: Primary care physician; Jerrol Banana., MD

## 2017-06-23 NOTE — Progress Notes (Signed)
Initial Nutrition Assessment  DOCUMENTATION CODES:   Not applicable  INTERVENTION:   Premier Protein daily, supplement provides 160 kcal and 30 grams of protein. (contains 538m calcium)  Liquid MVI daily- (contains no calcium)  Liberalize diet  Recommend check Vitamin A and Vitamin D labs   NUTRITION DIAGNOSIS:   Increased nutrient needs related to cancer and cancer related treatments as evidenced by increased estimated needs from protein.  GOAL:   Patient will meet greater than or equal to 90% of their needs  MONITOR:   PO intake, Supplement acceptance, Labs, I & O's, Weight trends, Skin  REASON FOR ASSESSMENT:   Malnutrition Screening Tool    ASSESSMENT:   72y.o. 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 LZollie Scalefor hypercalcemia   Met with pt in room today. Pt known to nutrition service from previous admit. Pt reports poor appetite and oral intake for the past month. Pt reports h/o chronic vomiting, once per day. Pt also reports difficulty swallowing that she relates to thrush. Pt previously eating pureed foods but reports that she has been able to eat more solid foods now and in fact, ate part of a hamburger yesterday. Pt has been drinking some Ensure, but not regularly. Pt does report a history of reflux but states that she takes Prilosec and no longer eats Tums. Pt reports her UBW around 152lbs. Per chart, pt has lost 7lbs(5%) over the past two months; this is not significant given the time frame. RD discussed the importance of adequate protein intake with pt. Pt usually drinks chocolate Ensure; will order Premier Protein as this contains the same amount calcium of Ensure but has more protein. Can provide pt with Ensure if she prefers. RD will also add liquid MVI as this does not contain calcium. Recommend check vitamin A and vitamin D labs as toxicity  can also lead to hypercalcemia. Pt currently eating 50% of meals; will liberalize diet.    Medications reviewed and include: aspirin, colace, lasix, heparin, protonix, KCl   Labs reviewed: Na 130(L), K 3.7 wnl, creat 1.37(H), Ca 12.1(H) P 3.7 wnl, Mg 1.4(L)- 2/26 Hgb 8.8(L), Hct 24.7(L)- 2/26 iPTH 14(L)- 2/26 TSH 1.605 wnl- 2/26 iCa 6.7(H)- 2/15  Nutrition-Focused physical exam completed. Findings are mild fat depletion in orbital regions, no muscle depletion, and mild edema.   Diet Order:  Diet Heart Room service appropriate? Yes; Fluid consistency: Thin  EDUCATION NEEDS:   Education needs have been addressed  Skin:  Reviewed RN Assessment  Last BM:  2/26  Height:   Ht Readings from Last 1 Encounters:  06/22/17 '5\' 1"'  (1.549 m)    Weight:   Wt Readings from Last 1 Encounters:  06/23/17 142 lb 6.7 oz (64.6 kg)    Ideal Body Weight:  47.7 kg  BMI:  Body mass index is 26.91 kg/m.  Estimated Nutritional Needs:   Kcal:  1500-1700kcal/day   Protein:  77-87g/day   Fluid:  8046mday (recommended for SIADH)  CaKoleen DistanceS, RD, LDN Pager #- [phone] Hours Pager: [phone]

## 2017-06-23 NOTE — Consult Note (Signed)
   [facility] CM Inpatient Consult   06/23/2017  Bianca Shaw 1945-10-22 [id number]  Follow up:  Patient is in the [employer] plan.  Spoke with inpatient RNCM Colletta [place] to alert her of [place] [facility] can follow for post hospital needs. Patient was alerted by [facility] call follow up prior to admission.  Made aware of patient eligibility.  Natividad Brood, RN BSN [place] [facility] Liaison  [phone] business mobile phone Toll free office [phone]

## 2017-06-23 NOTE — Consult Note (Addendum)
Reason for Consult: 72 year old white.  Consult for weakness.  Referring Physician: Hospitalist  [name]   HPI: History of breast cancer.  Currently on tamoxifen.  2007. History of one year low sodium.  Felt to be SIADH. She cleans houses.  Recently like she was getting weaker and needed to cut back. Had episode of fever and mild.  Is on stiffness...  She also. Some tremor.  Hands and swallowing Consult neurology.  Does not recent elevation.  PTH low.  Does have some pleased with granulomatous changes.  2 lung nodules Difficulty swallowing.  Is not checked.  Lost. Decreased thinking some hallucinations.  Family said she was very vibrant lady until 3 months ago.  PMH: Morphea.  No evidence of systemic..neg scl70  SURGICAL HISTORY: No joint complaints  Family History: Mother   Social History: No cigarette  Allergies:  Allergies  Allergen Reactions  . Pimenta Nausea And Vomiting  . Tomato     Medications:  Scheduled: . aspirin EC  81 mg Oral Daily  . busPIRone  5 mg Oral QHS  . docusate sodium  100 mg Oral BID  . furosemide  40 mg Oral Daily  . heparin  5,000 Units Subcutaneous Q8H  . hydrALAZINE  25 mg Oral BID  . metoprolol succinate  25 mg Oral Daily  . multivitamin  15 mL Oral Daily  . pantoprazole  40 mg Oral Daily  . potassium chloride SA  20 mEq Oral Daily  . protein supplement shake  11 oz Oral BID BM  . sodium chloride flush  3 mL Intravenous Q12H  . tamoxifen  20 mg Oral Daily        ROS: 20 pound weight loss.  No numbness or tingling .no or abd  chest   PHYSICAL EXAM: Blood pressure (!) 155/84, pulse 69, temperature 98 F (36.7 C), temperature source Oral, resp. rate 19, height 5\' 1"  (1.549 m), weight 64.6 kg (142 lb 6.7 oz), SpO2 95 %. Female.  Mild Staring....  Hyperpigmentation left breast right pelvis.  Consistent with morphea.  Oropharynx is clear.  Clear chest..  No visceromegaly.  Trace edema. Musculoskeletal: Neck moves well.   Shoulders move well.  Hands without Neurologic: Cogwheeling.  Rest tremor.  Very rigid range of motion.  Antalgic gait.  Paucity of motion..brisk DTRs 5/5 power.  Assessment: Significant recent neurologic decline.  Rigidity.  Abnormal gait.  Memory, swallowing difficulty.  Rule out Parkinson's . Hyponatremia. specific gravity 1.004.  Thought to be SIADH.  Hypocalcemia.  Rule out hypo-excretion.  Rule out tamoxifen related rule out sarcoid granulomatous liver.pending ACE  Breast cancer, without obvious recurrence  Morphea, limited scleroderma.  Without clinical significant   Recommendations: Hold fosamax and tamoxifen Neurology opinion Consider trial of steroids for her hyperCalcemia 24  Hour urine for calcium Swallowing eval  Emmaline Kluver 06/23/2017, 3:21 PM

## 2017-06-24 DIAGNOSIS — Z818 Family history of other mental and behavioral disorders: Secondary | ICD-10-CM | POA: Diagnosis not present

## 2017-06-24 DIAGNOSIS — Z825 Family history of asthma and other chronic lower respiratory diseases: Secondary | ICD-10-CM | POA: Diagnosis not present

## 2017-06-24 DIAGNOSIS — E222 Syndrome of inappropriate secretion of antidiuretic hormone: Secondary | ICD-10-CM | POA: Diagnosis present

## 2017-06-24 DIAGNOSIS — M349 Systemic sclerosis, unspecified: Secondary | ICD-10-CM | POA: Diagnosis present

## 2017-06-24 DIAGNOSIS — Z8249 Family history of ischemic heart disease and other diseases of the circulatory system: Secondary | ICD-10-CM | POA: Diagnosis not present

## 2017-06-24 DIAGNOSIS — Z9012 Acquired absence of left breast and nipple: Secondary | ICD-10-CM | POA: Diagnosis not present

## 2017-06-24 DIAGNOSIS — Z888 Allergy status to other drugs, medicaments and biological substances status: Secondary | ICD-10-CM | POA: Diagnosis not present

## 2017-06-24 DIAGNOSIS — R531 Weakness: Secondary | ICD-10-CM | POA: Diagnosis not present

## 2017-06-24 DIAGNOSIS — Z9841 Cataract extraction status, right eye: Secondary | ICD-10-CM | POA: Diagnosis not present

## 2017-06-24 DIAGNOSIS — Z961 Presence of intraocular lens: Secondary | ICD-10-CM | POA: Diagnosis present

## 2017-06-24 DIAGNOSIS — Z853 Personal history of malignant neoplasm of breast: Secondary | ICD-10-CM | POA: Diagnosis not present

## 2017-06-24 DIAGNOSIS — F419 Anxiety disorder, unspecified: Secondary | ICD-10-CM | POA: Diagnosis present

## 2017-06-24 DIAGNOSIS — Z809 Family history of malignant neoplasm, unspecified: Secondary | ICD-10-CM | POA: Diagnosis not present

## 2017-06-24 DIAGNOSIS — Z841 Family history of disorders of kidney and ureter: Secondary | ICD-10-CM | POA: Diagnosis not present

## 2017-06-24 DIAGNOSIS — Z820 Family history of epilepsy and other diseases of the nervous system: Secondary | ICD-10-CM | POA: Diagnosis not present

## 2017-06-24 DIAGNOSIS — Z7951 Long term (current) use of inhaled steroids: Secondary | ICD-10-CM | POA: Diagnosis not present

## 2017-06-24 DIAGNOSIS — Z90710 Acquired absence of both cervix and uterus: Secondary | ICD-10-CM | POA: Diagnosis not present

## 2017-06-24 DIAGNOSIS — Z8261 Family history of arthritis: Secondary | ICD-10-CM | POA: Diagnosis not present

## 2017-06-24 DIAGNOSIS — Z7982 Long term (current) use of aspirin: Secondary | ICD-10-CM | POA: Diagnosis not present

## 2017-06-24 DIAGNOSIS — I129 Hypertensive chronic kidney disease with stage 1 through stage 4 chronic kidney disease, or unspecified chronic kidney disease: Secondary | ICD-10-CM | POA: Diagnosis present

## 2017-06-24 DIAGNOSIS — N183 Chronic kidney disease, stage 3 (moderate): Secondary | ICD-10-CM | POA: Diagnosis present

## 2017-06-24 DIAGNOSIS — M810 Age-related osteoporosis without current pathological fracture: Secondary | ICD-10-CM | POA: Diagnosis present

## 2017-06-24 DIAGNOSIS — N179 Acute kidney failure, unspecified: Secondary | ICD-10-CM | POA: Diagnosis present

## 2017-06-24 DIAGNOSIS — Z87891 Personal history of nicotine dependence: Secondary | ICD-10-CM | POA: Diagnosis not present

## 2017-06-24 LAB — CBC WITH DIFFERENTIAL/PLATELET
Basophils Absolute: 0.1 10*3/uL (ref 0–0.1)
Basophils Relative: 1 %
Eosinophils Absolute: 0.5 10*3/uL (ref 0–0.7)
Eosinophils Relative: 10 %
HCT: 23.4 % — ABNORMAL LOW (ref 35.0–47.0)
Hemoglobin: 8.1 g/dL — ABNORMAL LOW (ref 12.0–16.0)
Lymphocytes Relative: 17 %
Lymphs Abs: 0.8 10*3/uL — ABNORMAL LOW (ref 1.0–3.6)
MCH: 33.9 pg (ref 26.0–34.0)
MCHC: 34.7 g/dL (ref 32.0–36.0)
MCV: 97.9 fL (ref 80.0–100.0)
Monocytes Absolute: 0.7 10*3/uL (ref 0.2–0.9)
Monocytes Relative: 14 %
Neutro Abs: 2.9 10*3/uL (ref 1.4–6.5)
Neutrophils Relative %: 58 %
Platelets: 281 10*3/uL (ref 150–440)
RBC: 2.39 MIL/uL — ABNORMAL LOW (ref 3.80–5.20)
RDW: 13.3 % (ref 11.5–14.5)
WBC: 5 10*3/uL (ref 3.6–11.0)

## 2017-06-24 LAB — KAPPA/LAMBDA LIGHT CHAINS
Kappa free light chain: 159.9 mg/L — ABNORMAL HIGH (ref 3.3–19.4)
Kappa, lambda light chain ratio: 2.79 — ABNORMAL HIGH (ref 0.26–1.65)
Lambda free light chains: 57.4 mg/L — ABNORMAL HIGH (ref 5.7–26.3)

## 2017-06-24 LAB — PROTEIN ELECTROPHORESIS, SERUM
A/G Ratio: 0.8 (ref 0.7–1.7)
ALBUMIN ELP: 2.9 g/dL (ref 2.9–4.4)
Alpha-1-Globulin: 0.2 g/dL (ref 0.0–0.4)
Alpha-2-Globulin: 0.7 g/dL (ref 0.4–1.0)
Beta Globulin: 1.1 g/dL (ref 0.7–1.3)
Gamma Globulin: 1.4 g/dL (ref 0.4–1.8)
Globulin, Total: 3.5 g/dL (ref 2.2–3.9)
TOTAL PROTEIN ELP: 6.4 g/dL (ref 6.0–8.5)

## 2017-06-24 LAB — BASIC METABOLIC PANEL
ANION GAP: 4 — AB (ref 5–15)
BUN: 21 mg/dL — ABNORMAL HIGH (ref 6–20)
CALCIUM: 11.2 mg/dL — AB (ref 8.9–10.3)
CO2: 24 mmol/L (ref 22–32)
CREATININE: 1.28 mg/dL — AB (ref 0.44–1.00)
Chloride: 101 mmol/L (ref 101–111)
GFR calc Af Amer: 47 mL/min — ABNORMAL LOW (ref >60)
GFR calc non Af Amer: 41 mL/min — ABNORMAL LOW (ref >60)
Glucose, Bld: 91 mg/dL (ref 65–99)
Potassium: 3.2 mmol/L — ABNORMAL LOW (ref 3.5–5.1)
Sodium: 129 mmol/L — ABNORMAL LOW (ref 135–145)

## 2017-06-24 LAB — DAT, POLYSPECIFIC AHG (ARMC ONLY): Polyspecific AHG test: NEGATIVE

## 2017-06-24 LAB — CALCIUM, IONIZED: CALCIUM, IONIZED, SERUM: 6.9 mg/dL — AB (ref 4.5–5.6)

## 2017-06-24 LAB — VITAMIN D 25 HYDROXY (VIT D DEFICIENCY, FRACTURES): Vit D, 25-Hydroxy: 68.6 ng/mL (ref 30.0–100.0)

## 2017-06-24 LAB — ANGIOTENSIN CONVERTING ENZYME: Angiotensin-Converting Enzyme: 47 U/L (ref 14–82)

## 2017-06-24 LAB — CORTISOL: Cortisol, Plasma: 21.4 ug/dL

## 2017-06-24 LAB — CALCITRIOL (1,25 DI-OH VIT D): Vit D, 1,25-Dihydroxy: 75.3 pg/mL (ref 19.9–79.3)

## 2017-06-24 MED ORDER — LISINOPRIL 20 MG PO TABS
20.0000 mg | ORAL_TABLET | Freq: Every day | ORAL | Status: DC
  Administered 2017-06-24: 20 mg via ORAL
  Filled 2017-06-24: qty 1

## 2017-06-24 MED ORDER — POTASSIUM CHLORIDE CRYS ER 20 MEQ PO TBCR
40.0000 meq | EXTENDED_RELEASE_TABLET | Freq: Once | ORAL | Status: AC
  Administered 2017-06-24: 40 meq via ORAL
  Filled 2017-06-24: qty 2

## 2017-06-24 MED ORDER — HYDRALAZINE HCL 50 MG PO TABS
50.0000 mg | ORAL_TABLET | Freq: Two times a day (BID) | ORAL | Status: DC
  Administered 2017-06-24: 50 mg via ORAL
  Filled 2017-06-24: qty 1

## 2017-06-24 MED ORDER — HYDRALAZINE HCL 50 MG PO TABS
50.0000 mg | ORAL_TABLET | Freq: Three times a day (TID) | ORAL | Status: DC
  Administered 2017-06-24 – 2017-06-25 (×2): 50 mg via ORAL
  Filled 2017-06-24 (×2): qty 1

## 2017-06-24 MED ORDER — HYDRALAZINE HCL 20 MG/ML IJ SOLN
10.0000 mg | Freq: Four times a day (QID) | INTRAMUSCULAR | Status: DC | PRN
  Administered 2017-06-24: 10 mg via INTRAVENOUS
  Filled 2017-06-24: qty 1

## 2017-06-24 MED ORDER — SODIUM CHLORIDE 0.9 % IV SOLN
INTRAVENOUS | Status: DC
  Administered 2017-06-24 – 2017-06-25 (×2): via INTRAVENOUS

## 2017-06-24 NOTE — Evaluation (Signed)
Physical Therapy Evaluation Patient Details Name: Bianca Shaw MRN: [id number] DOB: 04-10-1946 Today's Date: 06/24/2017   History of Present Illness  72yo female who was here 2 weeks ago for hyponatremia. PMH: BrCA, GAD, Pericarditis, mild COPD, scleroderma, UTI, GERD, HTN, SIADH. She is admitted again with weakness and hypercalcemia.  Clinical Impression  Pt generally did well with PT and was able to tolerate a prolonged bout of ambulation.  She showed no LOBs and only minimal fatigue.  She feels she is still not quite at her baseline, but ultimately agrees that she will be safe at home with husband and family/neighbors.     Follow Up Recommendations Home health PT    Equipment Recommendations       Recommendations for Other Services       Precautions / Restrictions Precautions Precautions: Fall Restrictions Weight Bearing Restrictions: No      Mobility  Bed Mobility Overal bed mobility: Modified Independent             General bed mobility comments: Pt able to get sitting to EOB without direct assist  Transfers Overall transfer level: Modified independent Equipment used: None Transfers: Sit to/from Stand Sit to Stand: Supervision         General transfer comment: Pt able to rise and maintain balance w/o assist  Ambulation/Gait Ambulation/Gait assistance: Supervision Ambulation Distance (Feet): 200 Feet Assistive device: None(used walker for the first 30 ft, did not need afterward)       General Gait Details: decreased step length, pt/friend report that she is not too far from more recent baseline  Stairs            Wheelchair Mobility    Modified Rankin (Stroke Patients Only)       Balance Overall balance assessment: Modified Independent                                           Pertinent Vitals/Pain Pain Assessment: No/denies pain    Home Living Family/patient expects to be discharged to:: Private  residence Living Arrangements: Spouse/significant other Available Help at Discharge: Family Type of Home: House Home Access: Stairs to enter   [employer] of Steps: 2 in front, 3 in back, no rails.  Home Layout: One level Home Equipment: Walker - standard;Bedside commode      Prior Function Level of Independence: Independent with assistive device(s)         Comments: Pt able to go out in the community, has not been as active for the last few months.     Hand Dominance        Extremity/Trunk Assessment   Upper Extremity Assessment Upper Extremity Assessment: Overall WFL for tasks assessed    Lower Extremity Assessment Lower Extremity Assessment: Overall WFL for tasks assessed       Communication   Communication: No difficulties  Cognition Arousal/Alertness: Awake/alert Behavior During Therapy: WFL for tasks assessed/performed Overall Cognitive Status: Within Functional Limits for tasks assessed                                        General Comments      Exercises     Assessment/Plan    PT Assessment Patient needs continued PT services  PT Problem List Decreased strength;Decreased activity tolerance;Decreased balance;Decreased mobility;Pain  PT Treatment Interventions DME instruction;Balance training;Gait training;Stair training;Functional mobility training;Therapeutic activities;Therapeutic exercise    PT Goals (Current goals can be found in the Care Plan section)  Acute Rehab PT Goals Patient Stated Goal: go home PT Goal Formulation: With patient Time For Goal Achievement: 07/08/17 Potential to Achieve Goals: Good    Frequency Min 2X/week   Barriers to discharge        Co-evaluation               AM-PAC PT "6 Clicks" Daily Activity  Outcome Measure Difficulty turning over in bed (including adjusting bedclothes, sheets and blankets)?: None Difficulty moving from lying on back to sitting on the side of  the bed? : A Little Difficulty sitting down on and standing up from a chair with arms (e.g., wheelchair, bedside commode, etc,.)?: None Help needed moving to and from a bed to chair (including a wheelchair)?: None Help needed walking in hospital room?: None Help needed climbing 3-5 steps with a railing? : A Little 6 Click Score: 22    End of Session Equipment Utilized During Treatment: Gait belt Activity Tolerance: Patient tolerated treatment well;No increased pain;Patient limited by fatigue Patient left: with call bell/phone within reach;with family/visitor present;in chair(pt previously refused alarm) Nurse Communication: Mobility status PT Visit Diagnosis: Unsteadiness on feet (R26.81);Difficulty in walking, not elsewhere classified (R26.2);Muscle weakness (generalized) (M62.81)    Time: 4446-1901 PT Time Calculation (min) (ACUTE ONLY): 22 min   Charges:   PT Evaluation $PT Eval Low Complexity: 1 Low     PT G Codes:        Kreg Shropshire, DPT 06/24/2017, 5:26 PM

## 2017-06-24 NOTE — Progress Notes (Signed)
[facility] [place] NAME: Bianca Shaw    MR#:  [id number]  DATE OF BIRTH:  1946-01-26  SUBJECTIVE:  CHIEF COMPLAINT:  No chief complaint on file.  - feeling some better today, calcium improving - BP elevated, PT pending  REVIEW OF SYSTEMS:  Review of Systems  Constitutional: Positive for malaise/fatigue. Negative for chills and fever.  HENT: Negative for congestion, ear discharge, hearing loss and nosebleeds.   Eyes: Negative for blurred vision and double vision.  Respiratory: Negative for cough, shortness of breath and wheezing.   Cardiovascular: Negative for chest pain, palpitations and leg swelling.  Gastrointestinal: Negative for abdominal pain, constipation, diarrhea, nausea and vomiting.  Genitourinary: Negative for dysuria.  Musculoskeletal: Positive for joint pain and myalgias.  Neurological: Positive for weakness. Negative for dizziness, speech change, focal weakness, seizures and headaches.  Psychiatric/Behavioral: Negative for depression.    DRUG ALLERGIES:   Allergies  Allergen Reactions  . Pimenta Nausea And Vomiting  . Tomato     VITALS:  Blood pressure (!) 181/62, pulse 64, temperature 98.5 F (36.9 C), temperature source Oral, resp. rate 18, height _0  (1.549 m), weight 64.6 kg (142 lb 6.7 oz), SpO2 99 %.  PHYSICAL EXAMINATION:  Physical Exam  GENERAL:  72 y.o.-year-old patient lying in the bed with no acute distress.  EYES: Pupils equal, round, reactive to light and accommodation. No scleral icterus. Extraocular muscles intact.  HEENT: Head atraumatic, normocephalic. Oropharynx and nasopharynx clear.  NECK:  Supple, no jugular venous distention. No thyroid enlargement, no tenderness.  LUNGS: Normal breath sounds bilaterally, no wheezing, rales,rhonchi or crepitation. No use of accessory muscles of respiration. Decreased bibasilar breath sounds CARDIOVASCULAR: S1, S2 normal. No  rubs, or gallops. 2/6  systolic murmur is present ABDOMEN: Soft, nontender, nondistended. Bowel sounds present. No organomegaly or mass.  EXTREMITIES: No pedal edema, cyanosis, or clubbing.  NEUROLOGIC: Cranial nerves II through XII are intact. Muscle strength 5/5 in all extremities. Sensation intact. Gait not checked. Global weakness noted PSYCHIATRIC: The patient is alert and oriented x 3.  SKIN: No obvious rash, lesion, or ulcer.    LABORATORY PANEL:   CBC Recent Labs  Lab 06/24/17 0435  WBC 5.0  HGB 8.1*  HCT 23.4*  PLT 281   ------------------------------------------------------------------------------------------------------------------  Chemistries  Recent Labs  Lab 06/22/17 1724  06/24/17 0435  NA 128*   < > 129*  K 4.1   < > 3.2*  CL 95*   < > 101  CO2 25   < > 24  GLUCOSE 92   < > 91  BUN 22*   < > 21*  CREATININE 1.48*   < > 1.28*  CALCIUM 12.9*   < > 11.2*  MG 1.4*  --   --   AST 19  --   --   ALT 24  --   --   ALKPHOS 41  --   --   BILITOT 0.4  --   --    < > = values in this interval not displayed.   ------------------------------------------------------------------------------------------------------------------  Cardiac Enzymes No results for input(s): TROPONINI in the last 168 hours. ------------------------------------------------------------------------------------------------------------------  RADIOLOGY:  Dg Bone Survey Met  Result Date: 06/22/2017 CLINICAL DATA:  Hypercalcemia, weakness EXAM: METASTATIC BONE SURVEY COMPARISON:  Chest CT 06/04/2017. CT abdomen and pelvis 06/11/2017. Chest x-ray 05/24/2017. FINDINGS: Calcified granuloma in the right mid lung. Lungs otherwise clear. Heart is normal size. No effusions. Degenerative changes in the cervical,  thoracic and lumbar spine with disc space narrowing and spurring. No focal bone lesion or acute bony abnormality. No focal lytic or sclerotic bone lesion within the visualized skeleton. No acute bony abnormality.  Aortoiliac atherosclerosis. No visible aneurysm. IMPRESSION: No focal suspicious lytic or sclerotic bone lesion. No acute bony abnormality. Electronically Signed   By: Rolm Baptise M.D.   On: 06/22/2017 20:09    EKG:   Orders placed or performed during the hospital encounter of 06/04/17  . ED EKG  . ED EKG  . 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  1. Hypercalcemia-subacute going on for almost 4 weeks now - Unknown cause at this time. Likely secondary hypercalcemia due to PTH levels are low. PTH related peptide levels are pending - Elevated ionized calcium levels -TSH is within normal limits. Myeloma workup was negative as outpatient. -Appreciate nephrology and oncology consults. -Skeletal survey did not show any abnormalities. Head CT of the chest and abdomen done last admission 2 weeks ago which did not show any signs of malignancy. -On fluids and Lasix with improvement in calcium levels -Known history of breast cancer but has been in remission for more than a year. Tumor markers have been within normal limits. -Autoimmune workup has been negative as well. -Appreciate rheumatology consult. Patient has a right middle lobe tiny granuloma and hepatic granuloma on her scans. Sarcoidosis needs to be ruled out. If level is pending. -Rarely tamoxifen can also cause hypercalcemia. We'll check with oncology to see if we can hold tamoxifen -If everything else is negative, considering a trial of steroids according to rheumatology.  2. Chronic hyponatremia-secondary to SIADH. Sodium is at 130. Continue fluids -Being followed by nephrology  3. History of breast cancer-finished chemotherapy, status post mastectomy, in remission for more than anemia. On   tamoxifen -Appreciate oncology consult  4. Weakness-secondary to hyponatremia and hypercalcemia. Seen neurology also as outpatient. MRI of the brain did not show any acute findings. -Concern for some cogwheel rigidity and resting tremors and shuffling gait. Neurology consult at to see if any Parkinson's medication needs to be started -Physical therapy consult  5. Hypertension-on metoprolol and hydralazine Lisinopril has been added as blood pressures are significantly elevated  6. DVT prophylaxis has been on subcutaneous heparin     All the records are reviewed and case discussed with Care Management/Social Workerr. Management plans discussed with the patient, family and they are in agreement.  CODE STATUS: Full code  TOTAL TIME TAKING CARE OF THIS PATIENT: 41 minutes.   POSSIBLE D/C TOMORROW, DEPENDING ON CLINICAL CONDITION.   Gladstone Lighter M.D on 06/24/2017 at 2:21 PM  Between 7am to 6pm - Pager - [phone]  After 6pm go to [email or web address] - password [email or web address] [place] [facility]  Office  [phone]  CC: Primary care physician; Jerrol Banana., MD

## 2017-06-24 NOTE — Consult Note (Signed)
Reason for Consult:Possible PD Referring Physician: Tressia Miners  CC: Weakness  HPI: Bianca Shaw is an 72 y.o. female who has most of the history provided by the husband.  It appears that the patient has had some decline over the past year with complaints of generalized weakness.  Also has had some memory issues as well.  Recently had what appears to have been a viral illness and after that time had a precipitous decline and was less functional.  Has been noted to have tremor, gait has been unstable and has generalized weakness.  Question of Parkinson's has been raised.   Seeing Dr. Melrose Nakayama on an outpatient basis.  Work up has included a B12 that was low and has been replaced.    Past Medical History:  Diagnosis Date  . Anemia   . Anxiety disorder   . Breast cancer of upper-outer quadrant of left female breast ([place]) 11/2015   pT2 pN0(i+).;ER+; PR +, her 2 neu not overexpressed.  Mastectomy, SLN, Mammoprint: Low risk.   . Cancer ([place]) 12/03/2015   left breast/ INVASIVE LOBULAR CARCINOMA.   . Cough    lingering, mild, finished Prednisone and anitbiotic 11/03/15  . Family history of adverse reaction to anesthesia    sister - PONV  . GERD (gastroesophageal reflux disease)   . H/O: hysterectomy   . Hypertension   . Osteopenia   . Osteoporosis   . Pericarditis    diagnonsed June, 2010, unclear etiology as of yer  . Personal history of tobacco use, presenting hazards to health 10/31/2015  . Scleroderma ([facility])    ONLY ON SKIN-MILD  . UTI (lower urinary tract infection)   . Wears dentures    full upper    Past Surgical History:  Procedure Laterality Date  . BREAST BIOPSY Right 2012   core - neg  . BREAST BIOPSY Left 12/03/2015   INVASIVE LOBULAR CARCINOMA.   Marland Kitchen CATARACT EXTRACTION W/ INTRAOCULAR LENS IMPLANT Right   . COLONOSCOPY WITH PROPOFOL N/A 11/08/2015   Procedure: COLONOSCOPY WITH PROPOFOL;  Surgeon: Lucilla Lame, MD;  Location: [facility];  Service: Endoscopy;   Laterality: N/A;  . EVACUATION BREAST HEMATOMA Left 01/14/2016   Procedure: EVACUATION HEMATOMA BREAST;  Surgeon: Robert Bellow, MD;  Location: [facility];  Service: General;  Laterality: Left;  Marland Kitchen MASTECTOMY Left 2017   complete mastectomy  . MASTECTOMY W/ SENTINEL NODE BIOPSY Left 12/26/2015   Procedure: MASTECTOMY WITH SENTINEL LYMPH NODE BIOPSY;  Surgeon: Robert Bellow, MD;  Location: [facility];  Service: General;  Laterality: Left;  . RIGHT/LEFT HEART CATH AND CORONARY ANGIOGRAPHY N/A 07/22/2016   Procedure: Right/Left Heart Cath and Coronary Angiography;  Surgeon: Minna Merritts, MD;  Location: [facility];  Service: Cardiovascular;  Laterality: N/A;  . TUBAL LIGATION    . VESICOVAGINAL FISTULA CLOSURE W/ TAH      Family History  Problem Relation Age of Onset  . Heart failure Mother   . Epilepsy Mother   . COPD Father   . Heart disease Father   . Anxiety disorder Sister   . Arthritis Brother   . Heart disease Brother   . Vaginal cancer Paternal Grandmother   . Heart attack Paternal Grandfather   . COPD Brother   . Kidney failure Brother   . COPD Brother   . Arthritis Sister   . Uterine cancer Unknown   . Diabetes Unknown   . Colon cancer Neg Hx   . Stomach cancer Neg Hx   .  Breast cancer Neg Hx     Social History:  reports that she quit smoking about 13 years ago. Her smoking use included cigarettes. She has a 30.00 pack-year smoking history. she has never used smokeless tobacco. She reports that she drinks about 0.6 - 1.8 oz of alcohol per week. She reports that she does not use drugs.  Allergies  Allergen Reactions  . Pimenta Nausea And Vomiting  . Tomato     Medications:  I have reviewed the patient's current medications. Prior to Admission:  Medications Prior to Admission  Medication Sig Dispense Refill Last Dose  . albuterol (PROVENTIL) (2.5 MG/3ML) 0.083% nebulizer solution Take 3 mLs (2.5 mg total) by nebulization every 6 (six) hours as needed  for wheezing or shortness of breath. 75 mL 12 PRN at PRN  . ALPRAZolam (XANAX) 0.5 MG tablet 1/2 tablet twice daily as needed (Patient taking differently: Take 0.25 mg by mouth 2 (two) times daily as needed for anxiety. ) 30 tablet 5 PRN at PRN  . aspirin EC 81 MG tablet Take 81 mg by mouth daily.   06/10/2017 at 0900  . busPIRone (BUSPAR) 5 MG tablet 1 Tablet, Oral QHS (Patient taking differently: Take 5 mg by mouth at bedtime. ) 30 tablet 12 06/09/2017 at pm  . clotrimazole (MYCELEX) 10 MG troche Take 1 lozenge (10 mg total) by mouth 4 (four) times daily. 28 lozenge 0 06/10/2017 at am  . fluticasone (FLONASE) 50 MCG/ACT nasal spray Place 2 sprays into the nose daily as needed for allergies or rhinitis.    PRN at PRN  . furosemide (LASIX) 20 MG tablet Take 1 tablet (20 mg total) by mouth daily. 30 tablet 0   . hydrALAZINE (APRESOLINE) 25 MG tablet Take 1 tablet (25 mg total) by mouth 2 (two) times daily. 60 tablet 11 06/10/2017 at 0900  . metoprolol succinate (TOPROL-XL) 25 MG 24 hr tablet Take 1 tablet (25 mg total) by mouth daily. 90 tablet 3 06/10/2017 at 0900  . nitroGLYCERIN (NITROSTAT) 0.4 MG SL tablet Place 1 tablet (0.4 mg total) under the tongue every 5 (five) minutes as needed for chest pain. 30 tablet 0 PRN at PRN  . omeprazole (PRILOSEC) 20 MG capsule Take 20 mg by mouth daily as needed. For heartburn   PRN at PRN  . ondansetron (ZOFRAN) 4 MG tablet Take 1 tablet (4 mg total) by mouth every 8 (eight) hours as needed for nausea or vomiting. 20 tablet 0   . potassium chloride SA (K-DUR,KLOR-CON) 20 MEQ tablet Take 1 tablet (20 mEq total) by mouth daily. 30 tablet 0   . tamoxifen (NOLVADEX) 20 MG tablet Take 1 tablet (20 mg total) by mouth daily. 90 tablet 3 06/09/2017 at am  . [DISCONTINUED] simvastatin (ZOCOR) 40 MG tablet Take 1 tablet (40 mg total) by mouth daily. (Patient not taking: Reported on 04/29/2017) 30 tablet 0 Not Taking at Unknown time   Scheduled: . aspirin EC  81 mg Oral Daily  .  busPIRone  5 mg Oral QHS  . docusate sodium  100 mg Oral BID  . furosemide  40 mg Oral Daily  . heparin  5,000 Units Subcutaneous Q8H  . hydrALAZINE  50 mg Oral BID  . lisinopril  20 mg Oral Daily  . metoprolol succinate  25 mg Oral Daily  . multivitamin  15 mL Oral Daily  . pantoprazole  40 mg Oral Daily  . potassium chloride SA  20 mEq Oral Daily  . protein supplement  shake  11 oz Oral Q24H  . sodium chloride flush  3 mL Intravenous Q12H  . tamoxifen  20 mg Oral Daily    ROS: History obtained from the patient  General ROS: fatigue Psychological ROS: memory difficulties Ophthalmic ROS: negative for - blurry vision, double vision, eye pain or loss of vision ENT ROS: negative for - epistaxis, nasal discharge, oral lesions, sore throat, tinnitus or vertigo Allergy and Immunology ROS: negative for - hives or itchy/watery eyes Hematological and Lymphatic ROS: negative for - bleeding problems, bruising or swollen lymph nodes Endocrine ROS: negative for - galactorrhea, hair pattern changes, polydipsia/polyuria or temperature intolerance Respiratory ROS: negative for - cough, hemoptysis, shortness of breath or wheezing Cardiovascular ROS: negative for - chest pain, dyspnea on exertion, edema or irregular heartbeat Gastrointestinal ROS: negative for - abdominal pain, diarrhea, hematemesis, nausea/vomiting or stool incontinence Genito-Urinary ROS: negative for - dysuria, hematuria, incontinence or urinary frequency/urgency Musculoskeletal ROS: negative for - joint swelling or muscular weakness Neurological ROS: as noted in HPI Dermatological ROS: negative for rash and skin lesion changes  Physical Examination: Blood pressure (!) 181/62, pulse 64, temperature 98.5 F (36.9 C), temperature source Oral, resp. rate 18, height _0  (1.549 m), weight 64.6 kg (142 lb 6.7 oz), SpO2 99 %.  HEENT-  Normocephalic, no lesions, without obvious abnormality.  Normal external eye and conjunctiva.   Normal TM's bilaterally.  Normal auditory canals and external ears. Normal external nose, mucus membranes and septum.  Normal pharynx. Cardiovascular- S1, S2 normal, pulses palpable throughout   Lungs- chest clear, no wheezing, rales, normal symmetric air entry Abdomen- soft, non-tender; bowel sounds normal; no masses,  no organomegaly Extremities- no edema Lymph-no adenopathy palpable Musculoskeletal-no joint tenderness, deformity or swelling Skin-warm and dry, no hyperpigmentation, vitiligo, or suspicious lesions  Neurological Examination   Mental Status: Alert, oriented, at times inappropriate.  Memory poor.  Speech fluent without evidence of aphasia.  Able to follow 3 step commands without difficulty. Cranial Nerves: II: Discs flat bilaterally; Visual fields grossly normal, pupils equal, round, reactive to light and accommodation III,IV, VI: ptosis not present, extra-ocular motions intact bilaterally V,VII: smile symmetric, facial light touch sensation normal bilaterally VIII: hearing normal bilaterally IX,X: gag reflex present XI: bilateral shoulder shrug XII: midline tongue extension Motor: Right : Upper extremity   5-/5 with pronator drift   Left:     Upper extremity   5/5  Lower extremity   5/5       Lower extremity   5/5 Tone and bulk:normal tone throughout; no atrophy noted. Mirror movements.   Sensory: Pinprick and light touch intact throughout, bilaterally.  Negative Romberg. Deep Tendon Reflexes: 2+ and symmetric with absent AJ's bilaterally.  Positive palmomental and other frontal release signs.   Plantars: Right: downgoing   Left: downgoing Cerebellar: Normal finger-to-nose testing.  Mild dysmetria with heel-to-shin testing bilaterally.   Gait: normal gait and station.  No evidence of shuffling    Laboratory Studies:   Basic Metabolic Panel: Recent Labs  Lab 06/22/17 1724 06/23/17 0435 06/24/17 0435  NA 128* 130* 129*  K 4.1 3.7 3.2*  CL 95* 101 101  CO2 _1 GLUCOSE 92 110* 91  BUN 22* 20 21*  CREATININE 1.48* 1.37* 1.28*  CALCIUM 12.9* 12.1* 11.2*  MG 1.4*  --   --   PHOS 3.7  --   --     Liver Function Tests: Recent Labs  Lab 06/22/17 1724  AST 19  ALT 24  ALKPHOS 41  BILITOT 0.4  PROT 6.8  ALBUMIN 3.1*   No results for input(s): LIPASE, AMYLASE in the last 168 hours. No results for input(s): AMMONIA in the last 168 hours.  CBC: Recent Labs  Lab 06/22/17 1724 06/24/17 0435  WBC 6.9 5.0  NEUTROABS  --  2.9  HGB 8.8* 8.1*  HCT 24.7* 23.4*  MCV 96.2 97.9  PLT 267 281    Cardiac Enzymes: No results for input(s): CKTOTAL, CKMB, CKMBINDEX, TROPONINI in the last 168 hours.  BNP: Invalid input(s): POCBNP  CBG: No results for input(s): GLUCAP in the last 168 hours.  Microbiology: No results found for this or any previous visit.  Coagulation Studies: No results for input(s): LABPROT, INR in the last 72 hours.  Urinalysis:  Recent Labs  Lab 06/23/17 0427  COLORURINE YELLOW*  LABSPEC 1.004*  PHURINE 7.0  GLUCOSEU NEGATIVE  HGBUR NEGATIVE  BILIRUBINUR NEGATIVE  KETONESUR NEGATIVE  PROTEINUR NEGATIVE  NITRITE NEGATIVE  LEUKOCYTESUR TRACE*    Lipid Panel:     Component Value Date/Time   CHOL 204 (H) 03/29/2017 1024   CHOL 208 (H) 10/11/2015 0832   TRIG 155 (H) 03/29/2017 1024   HDL 55 03/29/2017 1024   HDL 72 10/11/2015 0832   CHOLHDL 3.7 03/29/2017 1024   VLDL 17 07/16/2016 0602   LDLCALC 69 07/16/2016 0602   LDLCALC 123 (H) 10/11/2015 0832    HgbA1C: No results found for: HGBA1C  Urine Drug Screen:  No results found for: LABOPIA, COCAINSCRNUR, LABBENZ, AMPHETMU, THCU, LABBARB  Alcohol Level: No results for input(s): ETH in the last 168 hours.  Other results: EKG: sinus bradycardia at 58 bpm.  Imaging: Dg Bone Survey Met  Result Date: 06/22/2017 CLINICAL DATA:  Hypercalcemia, weakness EXAM: METASTATIC BONE SURVEY COMPARISON:  Chest CT 06/04/2017. CT abdomen and pelvis 06/11/2017.  Chest x-ray 05/24/2017. FINDINGS: Calcified granuloma in the right mid lung. Lungs otherwise clear. Heart is normal size. No effusions. Degenerative changes in the cervical, thoracic and lumbar spine with disc space narrowing and spurring. No focal bone lesion or acute bony abnormality. No focal lytic or sclerotic bone lesion within the visualized skeleton. No acute bony abnormality. Aortoiliac atherosclerosis. No visible aneurysm. IMPRESSION: No focal suspicious lytic or sclerotic bone lesion. No acute bony abnormality. Electronically Signed   By: Rolm Baptise M.D.   On: 06/22/2017 20:09     Assessment/Plan: 72 year old female with functional decline over the past year that has worsened since a viral illness a few weeks ago.  Concern has been raised for PD.  Exam at this time not consistent with PD.  Patient had a MRI of the brain earlier in the month that shows extensive small vessel ischemic changes.  Neurological examination most consistent with these findings and suspect that patient may have some Parkinsonian features related to small vessel ischemic changes that have been exacerbated by her recent hyponatremia and hypercalcemia.  Do not appreciate any significant tremor on examination today. More recently may have also had some form of a post-viral syndrome contributing as well.    Recommendations: 1.  Agree with treatment of current medical issues 2.  PT 3.  RPR 4.  Continue follow up with Dr. Melrose Nakayama on an outpatient basis  Alexis Goodell, MD Neurology [phone] 06/24/2017, 2:13 PM

## 2017-06-24 NOTE — Progress Notes (Signed)
[facility]  ROUNDING NOTE   Subjective:   Calcium 11.2  Na 129 K 3.2  Objective:  Vital signs in last 24 hours:  Temp:  [98 F (36.7 C)-98.7 F (37.1 C)] 98.2 F (36.8 C) (02/28 0424) Pulse Rate:  [66-72] 67 (02/28 0828) Resp:  [18-19] 18 (02/28 0424) BP: (155-187)/(69-85) 182/69 (02/28 0828) SpO2:  [95 %-97 %] 97 % (02/28 0424) Weight:  [64.6 kg (142 lb 6.7 oz)] 64.6 kg (142 lb 6.7 oz) (02/27 1218)  Weight change: 2.457 kg (5 lb 6.7 oz) Filed Weights   06/22/17 1628 06/23/17 1218  Weight: 62.1 kg (137 lb) 64.6 kg (142 lb 6.7 oz)    Intake/Output: I/O last 3 completed shifts: In: 2946 [P.O.:300; I.V.:2646] Out: 2850 [Urine:2850]   Intake/Output this shift:  Total I/O In: 120 [P.O.:120] Out: -   Physical Exam: General: NAD,   Head: Normocephalic, atraumatic. Moist oral mucosal membranes  Eyes: Anicteric, PERRL  Neck: Supple, trachea midline  Lungs:  Clear to auscultation  Heart: Regular rate and rhythm  Abdomen:  Soft, nontender,   Extremities: no peripheral edema.  Neurologic: +tremor  Skin: No lesions        Basic Metabolic Panel: Recent Labs  Lab 06/22/17 1724 06/23/17 0435 06/24/17 0435  NA 128* 130* 129*  K 4.1 3.7 3.2*  CL 95* 101 101  CO2 '25 24 24  ' GLUCOSE 92 110* 91  BUN 22* 20 21*  CREATININE 1.48* 1.37* 1.28*  CALCIUM 12.9* 12.1* 11.2*  MG 1.4*  --   --   PHOS 3.7  --   --     Liver Function Tests: Recent Labs  Lab 06/22/17 1724  AST 19  ALT 24  ALKPHOS 41  BILITOT 0.4  PROT 6.8  ALBUMIN 3.1*   No results for input(s): LIPASE, AMYLASE in the last 168 hours. No results for input(s): AMMONIA in the last 168 hours.  CBC: Recent Labs  Lab 06/22/17 1724 06/24/17 0435  WBC 6.9 5.0  NEUTROABS  --  2.9  HGB 8.8* 8.1*  HCT 24.7* 23.4*  MCV 96.2 97.9  PLT 267 281    Cardiac Enzymes: No results for input(s): CKTOTAL, CKMB, CKMBINDEX, TROPONINI in the last 168 hours.  BNP: Invalid input(s):  POCBNP  CBG: No results for input(s): GLUCAP in the last 168 hours.  Microbiology: No results found for this or any previous visit.  Coagulation Studies: No results for input(s): LABPROT, INR in the last 72 hours.  Urinalysis: Recent Labs    06/23/17 0427  COLORURINE YELLOW*  LABSPEC 1.004*  PHURINE 7.0  GLUCOSEU NEGATIVE  HGBUR NEGATIVE  BILIRUBINUR NEGATIVE  KETONESUR NEGATIVE  PROTEINUR NEGATIVE  NITRITE NEGATIVE  LEUKOCYTESUR TRACE*      Imaging: Dg Bone Survey Met  Result Date: 06/22/2017 CLINICAL DATA:  Hypercalcemia, weakness EXAM: METASTATIC BONE SURVEY COMPARISON:  Chest CT 06/04/2017. CT abdomen and pelvis 06/11/2017. Chest x-ray 05/24/2017. FINDINGS: Calcified granuloma in the right mid lung. Lungs otherwise clear. Heart is normal size. No effusions. Degenerative changes in the cervical, thoracic and lumbar spine with disc space narrowing and spurring. No focal bone lesion or acute bony abnormality. No focal lytic or sclerotic bone lesion within the visualized skeleton. No acute bony abnormality. Aortoiliac atherosclerosis. No visible aneurysm. IMPRESSION: No focal suspicious lytic or sclerotic bone lesion. No acute bony abnormality. Electronically Signed   By: Rolm Baptise M.D.   On: 06/22/2017 20:09     Medications:   . sodium chloride    .  sodium chloride 100 mL/hr at 06/24/17 0642   . aspirin EC  81 mg Oral Daily  . busPIRone  5 mg Oral QHS  . docusate sodium  100 mg Oral BID  . furosemide  40 mg Oral Daily  . heparin  5,000 Units Subcutaneous Q8H  . hydrALAZINE  50 mg Oral BID  . lisinopril  20 mg Oral Daily  . metoprolol succinate  25 mg Oral Daily  . multivitamin  15 mL Oral Daily  . pantoprazole  40 mg Oral Daily  . potassium chloride SA  20 mEq Oral Daily  . protein supplement shake  11 oz Oral Q24H  . sodium chloride flush  3 mL Intravenous Q12H  . tamoxifen  20 mg Oral Daily   sodium chloride, acetaminophen **OR** acetaminophen, albuterol,  ALPRAZolam, bisacodyl, fluticasone, hydrALAZINE, HYDROcodone-acetaminophen, nitroGLYCERIN, ondansetron **OR** ondansetron (ZOFRAN) IV, ondansetron, polyethylene glycol, sodium chloride flush  Assessment/ Plan:  [name] is a 72 y.o. white female with history of breast cancer status post left mastectomy, anxiety, hypertension, GERD, hyperlipidemia, allergic rhinitis, history of acute idiopathic pericarditis   1. Hypercalcemia: with PTH of 11, low PTH related peptide. Negative outpatient SPEP/UPEP. Normal TSH. Vitamin D leve at 68.6 - Denies any calcium/vitamin D or alkali ingestion.   Not on a thiazide diuretic.  - Agree with IV fluids - Continue furosemide. Increased to 79m daily - Check vitamin D level, free light chains. Also consider vitamin A level.  - Consult oncology.   2. Acute renal failure on Chronic Kidney Disease stage III with proteinuria: hypercalcemia causing renal failure, baseline creatinine of  1.26 Proteinuria could be due to hyperfiltration from hypercalcemia.   3. Hypertension: elevated on admission.  - furosemide increased to help with hypercalcemia  - Start lisinopril 731mdaily - hydralazine, metoprolol  4. Hyponatremia: sodium stable at 129.   - Continue to monitor with NS infusion   LOS: 1 Bianca Shaw 2/28/201910:13 AM

## 2017-06-25 ENCOUNTER — Inpatient Hospital Stay: Payer: PPO

## 2017-06-25 LAB — URINALYSIS, COMPLETE (UACMP) WITH MICROSCOPIC
BILIRUBIN URINE: NEGATIVE
Bacteria, UA: NONE SEEN
GLUCOSE, UA: NEGATIVE mg/dL
Hgb urine dipstick: NEGATIVE
KETONES UR: NEGATIVE mg/dL
Leukocytes, UA: NEGATIVE
NITRITE: NEGATIVE
PH: 5 (ref 5.0–8.0)
Protein, ur: NEGATIVE mg/dL
SPECIFIC GRAVITY, URINE: 1.011 (ref 1.005–1.030)

## 2017-06-25 LAB — BASIC METABOLIC PANEL
ANION GAP: 6 (ref 5–15)
BUN: 20 mg/dL (ref 6–20)
CHLORIDE: 104 mmol/L (ref 101–111)
CO2: 20 mmol/L — ABNORMAL LOW (ref 22–32)
Calcium: 10.7 mg/dL — ABNORMAL HIGH (ref 8.9–10.3)
Creatinine, Ser: 1.26 mg/dL — ABNORMAL HIGH (ref 0.44–1.00)
GFR calc non Af Amer: 42 mL/min — ABNORMAL LOW (ref >60)
GFR, EST AFRICAN AMERICAN: 48 mL/min — AB (ref >60)
Glucose, Bld: 97 mg/dL (ref 65–99)
POTASSIUM: 3.7 mmol/L (ref 3.5–5.1)
SODIUM: 130 mmol/L — AB (ref 135–145)

## 2017-06-25 LAB — UIFE/LIGHT CHAINS/TP QN, 24-HR UR
% BETA, Urine: 22 %
ALPHA 1 URINE: 4.9 %
ALPHA 2 UR: 9.4 %
Albumin, U: 23.9 %
FREE KAPPA LT CHAINS, UR: 157 mg/L — AB (ref 1.35–24.19)
FREE KAPPA/LAMBDA RATIO: 22.46 — AB (ref 2.04–10.37)
Free Lambda Lt Chains,Ur: 6.99 mg/L — ABNORMAL HIGH (ref 0.24–6.66)
GAMMA GLOBULIN URINE: 39.8 %
TOTAL PROTEIN, URINE-UPE24: 5.7 mg/dL
Total Protein, Urine-Ur/day: 131 mg/24 hr (ref 30–150)

## 2017-06-25 LAB — CALCIUM, URINE, 24 HOUR
Calcium, 24 hour urine: 310.5 mg/24 hr — ABNORMAL HIGH (ref 100.0–300.0)
Calcium, Ur: 13.5 mg/dL

## 2017-06-25 LAB — RPR: RPR Ser Ql: NONREACTIVE

## 2017-06-25 LAB — PTH-RELATED PEPTIDE

## 2017-06-25 MED ORDER — LISINOPRIL 20 MG PO TABS: 20.0000 mg | ORAL_TABLET | Freq: Every day | ORAL | 2 refills | Status: DC

## 2017-06-25 MED ORDER — IPRATROPIUM-ALBUTEROL 0.5-2.5 (3) MG/3ML IN SOLN
3.0000 mL | Freq: Once | RESPIRATORY_TRACT | Status: AC
  Administered 2017-06-25: 3 mL via RESPIRATORY_TRACT
  Filled 2017-06-25: qty 3

## 2017-06-25 MED ORDER — FUROSEMIDE 40 MG PO TABS: 40.0000 mg | ORAL_TABLET | Freq: Every day | ORAL | 2 refills | Status: DC

## 2017-06-25 MED ORDER — HYDRALAZINE HCL 25 MG PO TABS: 25.0000 mg | ORAL_TABLET | Freq: Two times a day (BID) | ORAL | Status: DC

## 2017-06-25 NOTE — Care Management (Signed)
Patient admitted from home with hypercalcemia.  Patient lives at home with husband.  PCP Rosanna Randy.  PT has assessed patient and recommends home health PT.   Referral was made to [facility] in February 2019, however patient states they did not hear back from [facility] after discharge.  They accept home health again this admission and would like to use [facility].  Heads up referral made to [facility] with [facility].  Patient has RW and BSC in the home.

## 2017-06-25 NOTE — Progress Notes (Signed)
[facility]  ROUNDING NOTE   Subjective:   Calcium 10.7 (11.2)  Na 130  NS at 127mL/hr  Objective:  Vital signs in last 24 hours:  Temp:  [97.7 F (36.5 C)-100.8 F (38.2 C)] 100.8 F (38.2 C) (03/01 0423) Pulse Rate:  [64-90] 67 (03/01 1010) Resp:  [16-18] 16 (03/01 0423) BP: (114-181)/(42-92) 114/42 (03/01 1010) SpO2:  [95 %-99 %] 97 % (03/01 1010)  Weight change:  Filed Weights   06/22/17 1628 06/23/17 1218  Weight: 62.1 kg (137 lb) 64.6 kg (142 lb 6.7 oz)    Intake/Output: I/O last 3 completed shifts: In: 4141 [P.O.:360; I.V.:3781] Out: 2025 [Urine:2025]   Intake/Output this shift:  Total I/O In: 360 [P.O.:360] Out: -   Physical Exam: General: NAD,   Head: Normocephalic, atraumatic. Moist oral mucosal membranes  Eyes: Anicteric, PERRL  Neck: Supple, trachea midline  Lungs:  Clear to auscultation  Heart: Regular rate and rhythm  Abdomen:  Soft, nontender,   Extremities: no peripheral edema.  Neurologic: +tremor  Skin: No lesions        Basic Metabolic Panel: Recent Labs  Lab 06/22/17 1724 06/23/17 0435 06/24/17 0435 06/25/17 0516  NA 128* 130* 129* 130*  K 4.1 3.7 3.2* 3.7  CL 95* 101 101 104  CO2 25 24 24  20*  GLUCOSE 92 110* 91 97  BUN 22* 20 21* 20  CREATININE 1.48* 1.37* 1.28* 1.26*  CALCIUM 12.9* 12.1* 11.2* 10.7*  MG 1.4*  --   --   --   PHOS 3.7  --   --   --     Liver Function Tests: Recent Labs  Lab 06/22/17 1724  AST 19  ALT 24  ALKPHOS 41  BILITOT 0.4  PROT 6.8  ALBUMIN 3.1*   No results for input(s): LIPASE, AMYLASE in the last 168 hours. No results for input(s): AMMONIA in the last 168 hours.  CBC: Recent Labs  Lab 06/22/17 1724 06/24/17 0435  WBC 6.9 5.0  NEUTROABS  --  2.9  HGB 8.8* 8.1*  HCT 24.7* 23.4*  MCV 96.2 97.9  PLT 267 281    Cardiac Enzymes: No results for input(s): CKTOTAL, CKMB, CKMBINDEX, TROPONINI in the last 168 hours.  BNP: Invalid input(s): POCBNP  CBG: No results  for input(s): GLUCAP in the last 168 hours.  Microbiology: No results found for this or any previous visit.  Coagulation Studies: No results for input(s): LABPROT, INR in the last 72 hours.  Urinalysis: Recent Labs    06/23/17 0427 06/25/17 1054  COLORURINE YELLOW* YELLOW*  LABSPEC 1.004* 1.011  PHURINE 7.0 5.0  GLUCOSEU NEGATIVE NEGATIVE  HGBUR NEGATIVE NEGATIVE  BILIRUBINUR NEGATIVE NEGATIVE  KETONESUR NEGATIVE NEGATIVE  PROTEINUR NEGATIVE NEGATIVE  NITRITE NEGATIVE NEGATIVE  LEUKOCYTESUR TRACE* NEGATIVE      Imaging: Dg Chest 2 View  Result Date: 06/25/2017 CLINICAL DATA:  Fever. Cough. History of breast carcinoma in August 2017. EXAM: CHEST  2 VIEW COMPARISON:  06/22/2017 FINDINGS: Cardiac silhouette is mildly enlarged. No mediastinal or hilar masses. No evidence of adenopathy. Small stable granuloma in the right middle lobe. Mildly prominent bronchovascular markings. Mild scarring at the apices. Lungs are otherwise clear. No pleural effusion or pneumothorax. Skeletal structures are demineralized but intact. IMPRESSION: No acute cardiopulmonary disease. Electronically Signed   By: Lajean Manes M.D.   On: 06/25/2017 10:45     Medications:   . sodium chloride     . aspirin EC  81 mg Oral Daily  . busPIRone  5 mg Oral QHS  . docusate sodium  100 mg Oral BID  . furosemide  40 mg Oral Daily  . heparin  5,000 Units Subcutaneous Q8H  . hydrALAZINE  50 mg Oral Q8H  . ipratropium-albuterol  3 mL Nebulization Once  . lisinopril  20 mg Oral Daily  . metoprolol succinate  25 mg Oral Daily  . multivitamin  15 mL Oral Daily  . pantoprazole  40 mg Oral Daily  . potassium chloride SA  20 mEq Oral Daily  . protein supplement shake  11 oz Oral Q24H  . sodium chloride flush  3 mL Intravenous Q12H  . tamoxifen  20 mg Oral Daily   sodium chloride, acetaminophen **OR** acetaminophen, albuterol, ALPRAZolam, bisacodyl, fluticasone, hydrALAZINE, HYDROcodone-acetaminophen,  nitroGLYCERIN, ondansetron **OR** ondansetron (ZOFRAN) IV, ondansetron, polyethylene glycol, sodium chloride flush  Assessment/ Plan:  [name] is a 72 y.o. white female with history of breast cancer status post left mastectomy, anxiety, hypertension, GERD, hyperlipidemia, allergic rhinitis, history of acute idiopathic pericarditis   1. Hypercalcemia: with PTH of 11, low PTH related peptide. Negative outpatient SPEP/UPEP. Normal TSH. Vitamin D leve at 68.6 - Denies any calcium/vitamin D or alkali ingestion.   Not on a thiazide diuretic.  - Discontinue IV fluis today - Continue furosemide. Increased to 40mg  daily - Appreciate oncology input.   2. Acute renal failure on Chronic Kidney Disease stage III with proteinuria: hypercalcemia causing renal failure, baseline creatinine of  1.26. Creatinine at baseline today.  Proteinuria could be due to hyperfiltration from hypercalcemia.   3. Hypertension: elevated on admission.  - furosemide increased to help with hypercalcemia  - Continue lisinopril 20mg  daily, hydralazine, metoprolol  4. Hyponatremia: sodium stable      LOS: 2 Bianca Shaw 3/1/201912:04 PM

## 2017-06-25 NOTE — Care Management Important Message (Signed)
Important Message  Patient Details  Name: Bianca Shaw MRN: [id number] Date of Birth: [birth year]   Medicare Important Message Given:  Yes    Beverly Sessions, RN 06/25/2017, 2:17 PM

## 2017-06-25 NOTE — Care Management Note (Signed)
Case Management Note  Patient Details  Name: Bianca Shaw MRN: [id number] Date of Birth: 1946-03-25   Brenton Grills from [facility] notified of discharge. RNCM signing off.   Subjective/Objective:                    Action/Plan:   Expected Discharge Date:  06/25/17               Expected Discharge Plan:  [place]  In-House Referral:     Discharge planning Services  CM Consult  Post Acute Care Choice:  Home Health Choice offered to:  Patient, Spouse  DME Arranged:    DME Agency:     HH Arranged:  PT Rule:  [place]  Status of Service:  Completed, signed off  If discussed at [facility] of Stay Meetings, dates discussed:    Additional Comments:  Beverly Sessions, RN 06/25/2017, 4:54 PM

## 2017-06-25 NOTE — Progress Notes (Signed)
Discharge instructions reviewed with patient and wife including new medications and medication changes and followup appointments.  Understanding was verbalized and all questions were answered.  Patient discharged home via wheelchair in stable condition escorted by nursing staff.

## 2017-06-25 NOTE — Progress Notes (Signed)
[facility] [place] NAME: Bianca Shaw    MR#:  [id number]  DATE OF BIRTH:  06-10-1945  SUBJECTIVE:  CHIEF COMPLAINT:  No chief complaint on file.  - feeling some better today, calcium improving - BP elevated - low grade fever this AM  REVIEW OF SYSTEMS:  Review of Systems  Constitutional: Positive for fever and malaise/fatigue. Negative for chills.  HENT: Negative for congestion, ear discharge, hearing loss and nosebleeds.   Eyes: Negative for blurred vision and double vision.  Respiratory: Negative for cough, shortness of breath and wheezing.   Cardiovascular: Negative for chest pain, palpitations and leg swelling.  Gastrointestinal: Negative for abdominal pain, constipation, diarrhea, nausea and vomiting.  Genitourinary: Negative for dysuria.  Musculoskeletal: Positive for joint pain and myalgias.  Neurological: Positive for weakness. Negative for dizziness, speech change, focal weakness, seizures and headaches.  Psychiatric/Behavioral: Negative for depression.    DRUG ALLERGIES:   Allergies  Allergen Reactions  . Pimenta Nausea And Vomiting  . Tomato     VITALS:  Blood pressure (!) 114/42, pulse 67, temperature (!) 100.8 F (38.2 C), temperature source Oral, resp. rate 16, height 5\' 1"  (1.549 m), weight 64.6 kg (142 lb 6.7 oz), SpO2 97 %.  PHYSICAL EXAMINATION:  Physical Exam  GENERAL:  72 y.o.-year-old patient lying in the bed with no acute distress.  EYES: Pupils equal, round, reactive to light and accommodation. No scleral icterus. Extraocular muscles intact.  HEENT: Head atraumatic, normocephalic. Oropharynx and nasopharynx clear.  NECK:  Supple, no jugular venous distention. No thyroid enlargement, no tenderness.  LUNGS: Normal breath sounds bilaterally, no wheezing, rales,rhonchi or crepitation. No use of accessory muscles of respiration. Decreased bibasilar breath sounds CARDIOVASCULAR: S1, S2 normal. No   rubs, or gallops. 2/6 systolic murmur is present ABDOMEN: Soft, nontender, nondistended. Bowel sounds present. No organomegaly or mass.  EXTREMITIES: No pedal edema, cyanosis, or clubbing.  NEUROLOGIC: Cranial nerves II through XII are intact. Muscle strength 5/5 in all extremities. Sensation intact. Gait not checked. Global weakness noted PSYCHIATRIC: The patient is alert and oriented x 3.  SKIN: No obvious rash, lesion, or ulcer.    LABORATORY PANEL:   CBC Recent Labs  Lab 06/24/17 0435  WBC 5.0  HGB 8.1*  HCT 23.4*  PLT 281   ------------------------------------------------------------------------------------------------------------------  Chemistries  Recent Labs  Lab 06/22/17 1724  06/25/17 0516  NA 128*   < > 130*  K 4.1   < > 3.7  CL 95*   < > 104  CO2 25   < > 20*  GLUCOSE 92   < > 97  BUN 22*   < > 20  CREATININE 1.48*   < > 1.26*  CALCIUM 12.9*   < > 10.7*  MG 1.4*  --   --   AST 19  --   --   ALT 24  --   --   ALKPHOS 41  --   --   BILITOT 0.4  --   --    < > = values in this interval not displayed.   ------------------------------------------------------------------------------------------------------------------  Cardiac Enzymes No results for input(s): TROPONINI in the last 168 hours. ------------------------------------------------------------------------------------------------------------------  RADIOLOGY:  Dg Chest 2 View  Result Date: 06/25/2017 CLINICAL DATA:  Fever. Cough. History of breast carcinoma in August 2017. EXAM: CHEST  2 VIEW COMPARISON:  06/22/2017 FINDINGS: Cardiac silhouette is mildly enlarged. No mediastinal or hilar masses. No evidence of adenopathy. Small stable granuloma in  the right middle lobe. Mildly prominent bronchovascular markings. Mild scarring at the apices. Lungs are otherwise clear. No pleural effusion or pneumothorax. Skeletal structures are demineralized but intact. IMPRESSION: No acute cardiopulmonary disease.  Electronically Signed   By: Lajean Manes M.D.   On: 06/25/2017 10:45    EKG:   Orders placed or performed during the hospital encounter of 06/04/17  . ED EKG  . ED EKG  . 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  1. Hypercalcemia-subacute going on for almost 4 weeks now - Unknown cause at this time. Likely secondary hypercalcemia due to PTH levels are low. PTH related peptide levels is low - Elevated ionized calcium levels -TSH is within normal limits. Myeloma workup was negative as outpatient. -Appreciate nephrology and oncology consults. -Skeletal survey did not show any abnormalities. Head CT of the chest and abdomen done last admission 2 weeks ago which did not show any signs of malignancy. -On fluids and Lasix with improvement in calcium levels -Known history of breast cancer but has been in remission for more than a year. Tumor markers have been within normal limits. -Autoimmune workup has been negative as well. -Appreciate rheumatology consult. Patient has a right middle lobe tiny granuloma and hepatic granuloma on her scans. Sarcoidosis needs to be ruled out. ACE level is pending. - very Rarely tamoxifen can also cause hypercalcemia.  checked with oncology - hold tamoxifen at discharge for 2 weeks  2. Chronic hyponatremia-Sodium is at 130.  - on lasix, off fluids today -Being followed by nephrology  3. History of breast cancer-finished chemotherapy, status post mastectomy, in remission for more than anemia.  - consider holding tamoxifen at discharge -Appreciate oncology consult  4. Weakness-secondary to hyponatremia and hypercalcemia. Seen neurology also as outpatient. MRI of the brain did not show any acute findings. -Concern for some cogwheel  rigidity and resting tremors and shuffling gait. Neurology consult at to see if any Parkinson's medication needs to be started -Physical therapy consulted  5. Hypertension-on metoprolol and hydralazine- dose reduced back to home dose Lisinopril has been added  6. Fever- unknown cause- check UA, CXR and blood cultures as BP low also today  7. DVT prophylaxis has been on subcutaneous heparin   Will be discharged with home health when ready   All the records are reviewed and case discussed with Care Management/Social Workerr. Management plans discussed with the patient, family and they are in agreement.  CODE STATUS: Full code  TOTAL TIME TAKING CARE OF THIS PATIENT: 33 minutes.   POSSIBLE D/C today or tomorrow, DEPENDING ON CLINICAL CONDITION.   Gladstone Lighter M.D on 06/25/2017 at 12:55 PM  Between 7am to 6pm - Pager - [phone]  After 6pm go to [email or web address] - password [email or web address] [facility]  Office  [phone]  CC: Primary care physician; Jerrol Banana., MD

## 2017-06-26 NOTE — Discharge Summary (Signed)
[facility] [place] NAME: Bianca Shaw    MR#:  [id number]  DATE OF BIRTH:  1946/04/27  DATE OF ADMISSION:  06/22/2017   ADMITTING PHYSICIAN: Gorden Harms, MD  DATE OF DISCHARGE: 06/25/2017  5:19 PM  PRIMARY CARE PHYSICIAN: Jerrol Banana., MD   ADMISSION DIAGNOSIS:   Hypercalcemia   DISCHARGE DIAGNOSIS:   Active Problems:   Hypercalcemia   SECONDARY DIAGNOSIS:   Past Medical History:  Diagnosis Date  . Anemia   . Anxiety disorder   . Breast cancer of upper-outer quadrant of left female breast ([facility]) 11/2015   pT2 pN0(i+).;ER+; PR +, her 2 neu not overexpressed.  Mastectomy, SLN, Mammoprint: Low risk.   . Cancer ([place]) 12/03/2015   left breast/ INVASIVE LOBULAR CARCINOMA.   . Cough    lingering, mild, finished Prednisone and anitbiotic 11/03/15  . Family history of adverse reaction to anesthesia    sister - PONV  . GERD (gastroesophageal reflux disease)   . H/O: hysterectomy   . Hypertension   . Osteopenia   . Osteoporosis   . Pericarditis    diagnonsed June, 2010, unclear etiology as of yer  . Personal history of tobacco use, presenting hazards to health 10/31/2015  . Scleroderma ([facility])    ONLY ON SKIN-MILD  . UTI (lower urinary tract infection)   . 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  1. Hypercalcemia-subacute going on for almost 4 weeks now - Unknown cause at this time. Likely secondary hypercalcemia due to PTH levels are low. PTH related peptide levels is low - Elevated ionized calcium levels -TSH is within normal limits. Myeloma workup was negative as outpatient. -Appreciate nephrology and oncology consults. -Skeletal survey did not show any  abnormalities. Head CT of the chest and abdomen done last admission 2 weeks ago which did not show any signs of malignancy. -On fluids and Lasix with improvement in calcium levels -Known history of breast cancer but has been in remission for more than a year. Tumor markers have been within normal limits. -Autoimmune workup has been negative as well. -Appreciate rheumatology consult. Patient has a right middle lobe tiny granuloma and hepatic granuloma on her scans. Sarcoidosis needs to be ruled out. ACE level is pending. - very Rarely tamoxifen can also cause hypercalcemia.  checked with oncology - hold tamoxifen at discharge for 2 weeks  2. Chronic hyponatremia-Sodium is at 130.  - on lasix, off fluids -Being followed by nephrology  3. History of breast cancer-finished chemotherapy, status post mastectomy, in remission for more than anemia.  - consider holding tamoxifen at discharge -Appreciate oncology consult  4. Weakness-secondary to hyponatremia and hypercalcemia. Seen neurology also as outpatient. MRI of the brain did not show any acute findings. -continue neuro f/u as outpatient for any parkinsons meds to be started  5. Hypertension-on metoprolol and hydralazine- dose reduced back to home dose Lisinopril has been added  6. Fever- unknown cause- had a low grade temp once the morning of discharge- follow up - no fevers - UA negative for infection, CXR negative Blood cultures drawn- need to be followed up. Patient clinically feels great. Will be discharged  Discharge with home health   DISCHARGE CONDITIONS:   Guarded  CONSULTS OBTAINED:   Treatment Team:  Gorden Harms, MD Earlie Server, MD Lavonia Dana, MD Emmaline Kluver., MD Alexis Goodell, MD  DRUG ALLERGIES:   Allergies  Allergen Reactions  . Pimenta Nausea And Vomiting  . Tomato    DISCHARGE MEDICATIONS:   Allergies as of 06/25/2017      Reactions   Pimenta Nausea And Vomiting   Tomato        Medication List    STOP taking these medications   clotrimazole 10 MG troche Commonly known as:  MYCELEX   tamoxifen 20 MG tablet Commonly known as:  NOLVADEX     TAKE these medications   albuterol (2.5 MG/3ML) 0.083% nebulizer solution Commonly known as:  PROVENTIL Take 3 mLs (2.5 mg total) by nebulization every 6 (six) hours as needed for wheezing or shortness of breath.   ALPRAZolam 0.5 MG tablet Commonly known as:  XANAX 1/2 tablet twice daily as needed What changed:    how much to take  how to take this  when to take this  reasons to take this  additional instructions   aspirin EC 81 MG tablet Take 81 mg by mouth daily.   busPIRone 5 MG tablet Commonly known as:  BUSPAR 1 Tablet, Oral QHS What changed:    how much to take  how to take this  when to take this  additional instructions   fluticasone 50 MCG/ACT nasal spray Commonly known as:  FLONASE Place 2 sprays into the nose daily as needed for allergies or rhinitis.   furosemide 40 MG tablet Commonly known as:  LASIX Take 1 tablet (40 mg total) by mouth daily. What changed:    medication strength  how much to take   hydrALAZINE 25 MG tablet Commonly known as:  APRESOLINE Take 1 tablet (25 mg total) by mouth 2 (two) times daily.   lisinopril 20 MG tablet Commonly known as:  PRINIVIL,ZESTRIL Take 1 tablet (20 mg total) by mouth daily.   metoprolol succinate 25 MG 24 hr tablet Commonly known as:  TOPROL-XL Take 1 tablet (25 mg total) by mouth daily.   nitroGLYCERIN 0.4 MG SL tablet Commonly known as:  NITROSTAT Place 1 tablet (0.4 mg total) under the tongue every 5 (five) minutes as needed for chest pain.   omeprazole 20 MG capsule Commonly known as:  PRILOSEC Take 20 mg by mouth daily as needed. For heartburn   ondansetron 4 MG tablet Commonly known as:  ZOFRAN Take 1 tablet (4 mg total) by mouth every 8 (eight) hours as needed for nausea or vomiting.   potassium chloride SA 20  MEQ tablet Commonly known as:  K-DUR,KLOR-CON Take 1 tablet (20 mEq total) by mouth daily.        DISCHARGE INSTRUCTIONS:   1. PCP f/u in 1-2 weeks 2. Nephrology f/u in 1 week 3. Oncology f/u in 1-2 weeks 4. Rheumatology f/u in 2 weeks  DIET:   Cardiac diet  ACTIVITY:   Activity as tolerated  OXYGEN:   Home Oxygen: No.  Oxygen Delivery: room air  DISCHARGE LOCATION:   home   If you experience worsening of your admission symptoms, develop shortness of breath, life threatening emergency, suicidal or homicidal thoughts you must seek medical attention immediately by calling 911 or calling your MD immediately  if symptoms less severe.  You Must read complete instructions/literature along with all the possible adverse reactions/side effects for all the Medicines you take and that have been prescribed to you.  Take any new Medicines after you have completely understood and accpet all the possible adverse reactions/side effects.   Please note  You were cared for by a hospitalist during your hospital stay. If you have any questions about your discharge medications or the care you received while you were in the hospital after you are discharged, you can call the unit and asked to speak with the hospitalist on call if the hospitalist that took care of you is not available. Once you are discharged, your primary care physician will handle any further medical issues. Please note that NO REFILLS for any discharge medications will be authorized once you are discharged, as it is imperative that you return to your primary care physician (or establish a relationship with a primary care physician if you do not have one) for your aftercare needs so that they can reassess your need for medications and monitor your lab values.    On the day of Discharge:  VITAL SIGNS:   Blood pressure (!) 129/49, pulse 76, temperature 97.9 F (36.6 C), temperature source Oral, resp. rate 17, height 5\' 1"  (1.549  m), weight 64.6 kg (142 lb 6.7 oz), SpO2 97 %.  PHYSICAL EXAMINATION:    GENERAL:  72 y.o.-year-old patient lying in the bed with no acute distress.  EYES: Pupils equal, round, reactive to light and accommodation. No scleral icterus. Extraocular muscles intact.  HEENT: Head atraumatic, normocephalic. Oropharynx and nasopharynx clear.  NECK:  Supple, no jugular venous distention. No thyroid enlargement, no tenderness.  LUNGS: Normal breath sounds bilaterally, no wheezing, rales,rhonchi or crepitation. No use of accessory muscles of respiration. Decreased bibasilar breath sounds CARDIOVASCULAR: S1, S2 normal. No  rubs, or gallops. 2/6 systolic murmur is present ABDOMEN: Soft, nontender, nondistended. Bowel sounds present. No organomegaly or mass.  EXTREMITIES: No pedal edema, cyanosis, or clubbing.  NEUROLOGIC: Cranial nerves II through XII are intact. Muscle strength 5/5 in all extremities. Sensation intact. Gait not checked. Global weakness noted PSYCHIATRIC: The patient is alert and oriented x 3.  SKIN: No obvious rash, lesion, or ulcer.    DATA REVIEW:   CBC Recent Labs  Lab 06/24/17 0435  WBC 5.0  HGB 8.1*  HCT 23.4*  PLT 281    Chemistries  Recent Labs  Lab 06/22/17 1724  06/25/17 0516  NA 128*   < > 130*  K 4.1   < > 3.7  CL 95*   < > 104  CO2 25   < > 20*  GLUCOSE 92   < > 97  BUN 22*   < > 20  CREATININE 1.48*   < > 1.26*  CALCIUM 12.9*   < > 10.7*  MG 1.4*  --   --   AST 19  --   --   ALT 24  --   --   ALKPHOS 41  --   --   BILITOT 0.4  --   --    < > = values in this interval not displayed.     Microbiology Results  Results for orders placed or performed during the hospital encounter of 06/22/17  CULTURE, BLOOD (ROUTINE X 2) w Reflex to ID Panel     Status: None (Preliminary result)   Collection Time: 06/25/17 12:29 PM  Result Value Ref Range Status   Specimen Description BLOOD BLOOD RIGHT HAND  Final   Special Requests   Final    BOTTLES DRAWN  AEROBIC AND ANAEROBIC Blood Culture adequate volume   Culture  Final    NO GROWTH < 24 HOURS Performed at [facility], [address] [place], [place] [address]    Report Status PENDING  Incomplete  CULTURE, BLOOD (ROUTINE X 2) w Reflex to ID Panel     Status: None (Preliminary result)   Collection Time: 06/25/17  1:39 PM  Result Value Ref Range Status   Specimen Description BLOOD BLOOD RIGHT HAND  Final   Special Requests   Final    BOTTLES DRAWN AEROBIC AND ANAEROBIC Blood Culture adequate volume   Culture   Final    NO GROWTH < 24 HOURS Performed at [facility], [address], [place], [place] [address]    Report Status PENDING  Incomplete    RADIOLOGY:  No results found.   Management plans discussed with the patient, family and they are in agreement.  CODE STATUS:  Code Status History    Date Active Date Inactive Code Status Order ID Comments User Context   06/22/2017 17:08 06/25/2017 20:24 Full Code [id number]  Gorden Harms, MD Inpatient   06/10/2017 17:09 06/12/2017 19:40 Full Code [id number]  Henreitta Leber, MD Inpatient   07/22/2016 14:20 07/22/2016 19:03 Full Code [id number]  Minna Merritts, MD Inpatient   07/16/2016 10:49 07/17/2016 16:51 Full Code [id number]  Bettey Costa, MD ED      TOTAL TIME TAKING CARE OF THIS PATIENT: 36 minutes.    Gladstone Lighter M.D on 06/26/2017 at 12:41 PM  Between 7am to 6pm - Pager - [phone]  After 6pm go to [email or web address] - password [email or web address] [facility]  [facility]  Office  [phone]  CC: Primary care physician; Jerrol Banana., MD   Note: This dictation was prepared with Dragon dictation along with smaller phrase technology. Any transcriptional errors that result from this process are unintentional.

## 2017-06-27 LAB — PTH-RELATED PEPTIDE

## 2017-06-28 ENCOUNTER — Telehealth: Payer: Self-pay | Admitting: *Deleted

## 2017-06-28 ENCOUNTER — Telehealth: Payer: Self-pay

## 2017-06-28 NOTE — Telephone Encounter (Signed)
Transition Care Management Follow-Up Telephone Call   Date discharged and where: [facility] on 06/25/17.  How have you been since you were released from the hospital? Spoke with husband and he states pt is doing better, denies fever and nausea. She's back on a regular diet and she is gaining her strength back. FYI- Pt was told she had several mini strokes and scleroderma, while in the hospital.    Any patient concerns? What is to come of pts health.  Items Reviewed:   Meds: verified  Allergies: verified  Dietary Changes Reviewed: no more than 40 oz of liquid in 24 hours.  Functional Questionnaire:  Independent-I Dependent-D  ADLs:   Dressing- I    Eating- I   Maintaining continence- I   Transferring- I   Transportation- D (currently)   Meal Prep- Needs assistance   Managing Meds- D  Confirmed importance and Date/Time of follow-up visits scheduled: NO- cancelled apt due to having other speciality apt and "not needing to see PCP."   Confirmed with patient if condition worsens to call PCP or go to the Emergency Dept. Patient was given office number and encouraged to call back with questions or concerns: YES.

## 2017-06-28 NOTE — Telephone Encounter (Signed)
Per Gaspar Bidding 06/28/17 staff message to schedule Patient to see MD early this week. Patient was  Scheduled for 06/29/17 @ 11:00  Patient is aware of date and time.

## 2017-06-29 ENCOUNTER — Inpatient Hospital Stay: Payer: PPO | Attending: Hematology and Oncology | Admitting: Hematology and Oncology

## 2017-06-29 ENCOUNTER — Inpatient Hospital Stay: Payer: PPO | Admitting: *Deleted

## 2017-06-29 ENCOUNTER — Telehealth: Payer: Self-pay | Admitting: *Deleted

## 2017-06-29 ENCOUNTER — Encounter: Payer: Self-pay | Admitting: Hematology and Oncology

## 2017-06-29 VITALS — BP 145/83 | HR 66 | Temp 96.8°F | Resp 20 | Wt 141.6 lb

## 2017-06-29 DIAGNOSIS — Z79810 Long term (current) use of selective estrogen receptor modulators (SERMs): Secondary | ICD-10-CM | POA: Insufficient documentation

## 2017-06-29 DIAGNOSIS — I10 Essential (primary) hypertension: Secondary | ICD-10-CM | POA: Insufficient documentation

## 2017-06-29 DIAGNOSIS — I739 Peripheral vascular disease, unspecified: Secondary | ICD-10-CM | POA: Diagnosis not present

## 2017-06-29 DIAGNOSIS — K5900 Constipation, unspecified: Secondary | ICD-10-CM | POA: Diagnosis not present

## 2017-06-29 DIAGNOSIS — D649 Anemia, unspecified: Secondary | ICD-10-CM | POA: Diagnosis not present

## 2017-06-29 DIAGNOSIS — F419 Anxiety disorder, unspecified: Secondary | ICD-10-CM | POA: Insufficient documentation

## 2017-06-29 DIAGNOSIS — Z9012 Acquired absence of left breast and nipple: Secondary | ICD-10-CM

## 2017-06-29 DIAGNOSIS — Z853 Personal history of malignant neoplasm of breast: Secondary | ICD-10-CM | POA: Diagnosis not present

## 2017-06-29 DIAGNOSIS — E871 Hypo-osmolality and hyponatremia: Secondary | ICD-10-CM

## 2017-06-29 DIAGNOSIS — K219 Gastro-esophageal reflux disease without esophagitis: Secondary | ICD-10-CM | POA: Insufficient documentation

## 2017-06-29 DIAGNOSIS — C50412 Malignant neoplasm of upper-outer quadrant of left female breast: Secondary | ICD-10-CM

## 2017-06-29 DIAGNOSIS — M810 Age-related osteoporosis without current pathological fracture: Secondary | ICD-10-CM | POA: Diagnosis not present

## 2017-06-29 DIAGNOSIS — E222 Syndrome of inappropriate secretion of antidiuretic hormone: Secondary | ICD-10-CM | POA: Insufficient documentation

## 2017-06-29 DIAGNOSIS — Z79899 Other long term (current) drug therapy: Secondary | ICD-10-CM | POA: Diagnosis not present

## 2017-06-29 DIAGNOSIS — Z7982 Long term (current) use of aspirin: Secondary | ICD-10-CM | POA: Insufficient documentation

## 2017-06-29 DIAGNOSIS — Z90710 Acquired absence of both cervix and uterus: Secondary | ICD-10-CM | POA: Diagnosis not present

## 2017-06-29 DIAGNOSIS — Z170 Estrogen receptor positive status [ER+]: Secondary | ICD-10-CM | POA: Diagnosis not present

## 2017-06-29 DIAGNOSIS — Z7189 Other specified counseling: Secondary | ICD-10-CM | POA: Insufficient documentation

## 2017-06-29 DIAGNOSIS — Z87891 Personal history of nicotine dependence: Secondary | ICD-10-CM | POA: Insufficient documentation

## 2017-06-29 DIAGNOSIS — M349 Systemic sclerosis, unspecified: Secondary | ICD-10-CM | POA: Diagnosis not present

## 2017-06-29 LAB — BASIC METABOLIC PANEL
Anion gap: 7 (ref 5–15)
BUN: 35 mg/dL — AB (ref 6–20)
CALCIUM: 12.2 mg/dL — AB (ref 8.9–10.3)
CO2: 24 mmol/L (ref 22–32)
CREATININE: 1.41 mg/dL — AB (ref 0.44–1.00)
Chloride: 97 mmol/L — ABNORMAL LOW (ref 101–111)
GFR calc Af Amer: 42 mL/min — ABNORMAL LOW (ref >60)
GFR calc non Af Amer: 36 mL/min — ABNORMAL LOW (ref >60)
GLUCOSE: 99 mg/dL (ref 65–99)
Potassium: 3.8 mmol/L (ref 3.5–5.1)
Sodium: 128 mmol/L — ABNORMAL LOW (ref 135–145)

## 2017-06-29 LAB — CBC WITH DIFFERENTIAL/PLATELET
Basophils Absolute: 0.1 10*3/uL (ref 0–0.1)
Basophils Relative: 1 %
EOS ABS: 0.4 10*3/uL (ref 0–0.7)
Eosinophils Relative: 6 %
HCT: 24.9 % — ABNORMAL LOW (ref 35.0–47.0)
HEMOGLOBIN: 8.6 g/dL — AB (ref 12.0–16.0)
LYMPHS ABS: 1.2 10*3/uL (ref 1.0–3.6)
Lymphocytes Relative: 20 %
MCH: 33.7 pg (ref 26.0–34.0)
MCHC: 34.4 g/dL (ref 32.0–36.0)
MCV: 98 fL (ref 80.0–100.0)
MONOS PCT: 14 %
Monocytes Absolute: 0.8 10*3/uL (ref 0.2–0.9)
Neutro Abs: 3.5 10*3/uL (ref 1.4–6.5)
Neutrophils Relative %: 59 %
Platelets: 369 10*3/uL (ref 150–440)
RBC: 2.54 MIL/uL — ABNORMAL LOW (ref 3.80–5.20)
RDW: 13.3 % (ref 11.5–14.5)
WBC: 6 10*3/uL (ref 3.6–11.0)

## 2017-06-29 LAB — SAMPLE TO BLOOD BANK

## 2017-06-29 LAB — FOLATE: Folate: 13.6 ng/mL (ref >5.9)

## 2017-06-29 LAB — T4, FREE: Free T4: 1.14 ng/dL — ABNORMAL HIGH (ref 0.61–1.12)

## 2017-06-29 NOTE — Progress Notes (Signed)
Patient here today after being discharged from hospital on Saturday for hyponatremia and hypercalcemia.  States she is eating better.

## 2017-06-29 NOTE — Progress Notes (Signed)
[facility] day:  06/29/2017   Chief Complaint: Bianca Shaw is a 72 y.o. female with stage IIA left breast cancer who is seen for assessment after interval hospitalization.  HPI:  The patient was last seen in the medical oncology clinic on 04/09/2017.  At that time, she denied any complaint.  Exam was stable .  Hematocrit was 30.8 with a hemoglobin of 10.5.  Ferritin was 61 with an iron saturation of 25% and a TIBC of 335.  MMA was normal.  Creatinine was 1.09, albumen 3.9, protein 7.9, and calcium 9.2.  Sodium was 126.  CA27.29 was normal.  She was admitted to [facility] from 06/22/2017 - 06/25/2017 with generalized weakness, nausea/vomiting, hyponatremia, and hypercalcemia.  She was admitted directly from Dr Elwyn Lade office.  Labs on admission revealed a sodium of 128, potassium 4.1, BUN 22, creatinine 1.48.  Albumen was 3.1.  Calcium was 12.9.  LFTs were normal with an alkaline phosphatase of 41.  Hematocrit was 24.7, hemoglobin 8.8, MCV 96.2, platelets 267,000, WBC 6900.  PTH was 14 (low).  TSH was normal.  Urinalysis was negative.   Her husband notes that she has been to the hospital 4 times since Gwynne Edinger Day (05/17/2017) with progressive deterioration.  She has had progressive weakness, poor oral intake with nausea and vomiting, fevers, and lower extremity weakness/stiffness and pain.  She has been followed closely by nephrology and recently neurology.   Labs on 06/11/2017 revealed a + ribonucleic protein of 2.9 (0-0.9).  She saw Dr Jefm Bryant in consultation on 06/23/2017.  Recommendation was to hold Fosamax and tamoxifen.  Consider trial of steroids.  24 hour urine for calcium (310.5 mg/24 hr; 100-300).  Swallowing evaluation.  Obtain neurology opinion re concerns for Parkinson's.  She has had several imaging studies without documentation of metastatic disease.  Chest CT on 06/04/2017 revealed no pneumonia, edema, collapse or effusion.   There was a right middle lobe calcified granuloma. There was a stable 8 mm scar posterior aspect of the right upper lobe.  Abdomen and pelvic CT on 06/11/2017 revealed no acute process in the abdomen or pelvis.  There was possible bladder wall thickening (? under distention).  There was nonspecific presacral and posterior pelvic edema.  There was central uterine hypoattenuation (subtle).  Head MRI without contrast on 06/16/2017 revealed no acute intracranial abnormality.  There was wide spread changes c/w advanced small vessel disease.  Bone survey on 06/22/2017 revealed no focal lytic or sclerotic lesion.  While hospitalized, she received IVF.  Labs at discharge included a sodium of 130, creatinine 1.26, calcium 10.7.  Hematocrit was 23.4, hemoglobin 8.1, MCV 97.9, platelets 281,000, WBC 5000 with an ANC of 2900.  Symptomatically, she notes a poor appetite (50%) and constipation.  She is tired and has no energy.  She describes being "slow".  Her husband notes that sometimes she "talks out of her head".  She may have Parkinson's disease.   Past Medical History:  Diagnosis Date  . Anemia   . Anxiety disorder   . Breast cancer of upper-outer quadrant of left female breast ([place]) 11/2015   pT2 pN0(i+).;ER+; PR +, her 2 neu not overexpressed.  Mastectomy, SLN, Mammoprint: Low risk.   . Cancer ([place]) 12/03/2015   left breast/ INVASIVE LOBULAR CARCINOMA.   . Cough    lingering, mild, finished Prednisone and anitbiotic 11/03/15  . Family history of adverse reaction to anesthesia    sister - PONV  .  GERD (gastroesophageal reflux disease)   . H/O: hysterectomy   . Hypertension   . Osteopenia   . Osteoporosis   . Pericarditis    diagnonsed June, 2010, unclear etiology as of yer  . Personal history of tobacco use, presenting hazards to health 10/31/2015  . Scleroderma ([facility])    ONLY ON SKIN-MILD  . UTI (lower urinary tract infection)   . Wears dentures    full upper    Past Surgical History:   Procedure Laterality Date  . BREAST BIOPSY Right 2012   core - neg  . BREAST BIOPSY Left 12/03/2015   INVASIVE LOBULAR CARCINOMA.   Marland Kitchen CATARACT EXTRACTION W/ INTRAOCULAR LENS IMPLANT Right   . COLONOSCOPY WITH PROPOFOL N/A 11/08/2015   Procedure: COLONOSCOPY WITH PROPOFOL;  Surgeon: Lucilla Lame, MD;  Location: [facility];  Service: Endoscopy;  Laterality: N/A;  . EVACUATION BREAST HEMATOMA Left 01/14/2016   Procedure: EVACUATION HEMATOMA BREAST;  Surgeon: Robert Bellow, MD;  Location: [facility];  Service: General;  Laterality: Left;  Marland Kitchen MASTECTOMY Left 2017   complete mastectomy  . MASTECTOMY W/ SENTINEL NODE BIOPSY Left 12/26/2015   Procedure: MASTECTOMY WITH SENTINEL LYMPH NODE BIOPSY;  Surgeon: Robert Bellow, MD;  Location: [facility];  Service: General;  Laterality: Left;  . RIGHT/LEFT HEART CATH AND CORONARY ANGIOGRAPHY N/A 07/22/2016   Procedure: Right/Left Heart Cath and Coronary Angiography;  Surgeon: Minna Merritts, MD;  Location: [facility];  Service: Cardiovascular;  Laterality: N/A;  . TUBAL LIGATION    . VESICOVAGINAL FISTULA CLOSURE W/ TAH      Family History  Problem Relation Age of Onset  . Heart failure Mother   . Epilepsy Mother   . COPD Father   . Heart disease Father   . Anxiety disorder Sister   . Arthritis Brother   . Heart disease Brother   . Vaginal cancer Paternal Grandmother   . Heart attack Paternal Grandfather   . COPD Brother   . Kidney failure Brother   . COPD Brother   . Arthritis Sister   . Uterine cancer Unknown   . Diabetes Unknown   . Colon cancer Neg Hx   . Stomach cancer Neg Hx   . Breast cancer Neg Hx     Social History:  reports that she quit smoking about 13 years ago. Her smoking use included cigarettes. She has a 30.00 pack-year smoking history. she has never used smokeless tobacco. She reports that she drinks about 0.6 - 1.8 oz of alcohol per week. She reports that she does not use drugs.  She has 5 brothers  and 2 sisters.  She has 2 children who are alive and well.  She lives in [place] with her husband, Pat Patrick.  The patient is accompanied by her husband today.  Allergies:  Allergies  Allergen Reactions  . Pimenta Nausea And Vomiting  . Tomato     Current Medications: Current Outpatient Medications  Medication Sig Dispense Refill  . albuterol (PROVENTIL) (2.5 MG/3ML) 0.083% nebulizer solution Take 3 mLs (2.5 mg total) by nebulization every 6 (six) hours as needed for wheezing or shortness of breath. 75 mL 12  . ALPRAZolam (XANAX) 0.5 MG tablet 1/2 tablet twice daily as needed (Patient taking differently: Take 0.25 mg by mouth 2 (two) times daily as needed for anxiety. ) 30 tablet 5  . aspirin EC 81 MG tablet Take 81 mg by mouth daily.    . busPIRone (BUSPAR) 5 MG tablet 1 Tablet,  Oral QHS (Patient taking differently: Take 5 mg by mouth at bedtime. ) 30 tablet 12  . fluticasone (FLONASE) 50 MCG/ACT nasal spray Place 2 sprays into the nose daily as needed for allergies or rhinitis.     . furosemide (LASIX) 40 MG tablet Take 1 tablet (40 mg total) by mouth daily. 30 tablet 2  . hydrALAZINE (APRESOLINE) 25 MG tablet Take 1 tablet (25 mg total) by mouth 2 (two) times daily. 60 tablet 11  . lisinopril (PRINIVIL,ZESTRIL) 20 MG tablet Take 1 tablet (20 mg total) by mouth daily. 30 tablet 2  . metoprolol succinate (TOPROL-XL) 25 MG 24 hr tablet Take 1 tablet (25 mg total) by mouth daily. 90 tablet 3  . nitroGLYCERIN (NITROSTAT) 0.4 MG SL tablet Place 1 tablet (0.4 mg total) under the tongue every 5 (five) minutes as needed for chest pain. 30 tablet 0  . omeprazole (PRILOSEC) 20 MG capsule Take 20 mg by mouth daily as needed. For heartburn    . ondansetron (ZOFRAN) 4 MG tablet Take 1 tablet (4 mg total) by mouth every 8 (eight) hours as needed for nausea or vomiting. 20 tablet 0  . potassium chloride SA (K-DUR,KLOR-CON) 20 MEQ tablet Take 1 tablet (20 mEq total) by mouth daily. 30 tablet 0   No current  facility-administered medications for this visit.     Review of Systems:  GENERAL:  Feels "tired and slow".  No energy.  No fevers or sweats.  Weight down 11 pounds since 05/24/2017. PERFORMANCE STATUS (ECOG): 0 HEENT:  No visual changes, runny nose, sore throat, mouth sores or tenderness. Lungs: No shortness of breath or cough.  No hemoptysis. Cardiac:  No chest pain, palpitations, orthopnea, or PND. GI:  Poor appetite.  No nausea, vomiting, diarrhea, constipation, melena or hematochezia.  Colonoscopy done 09/2015. GU:  No urgency, frequency, dysuria, or hematuria. Musculoskeletal:  Osteoporosis.  No back pain.  No joint pain.  No muscle tenderness. Extremities:  No pain or swelling. Skin:  Skin changes due to scleroderma.  s/p left mastectomy.  Area at mastectomy site of concern.  No rashes or skin changes. Neuro:  Sometimes "talks out of head".  Poor memory.  No headache, numbness or weakness, balance or coordination issues. Endocrine:  No diabetes, thyroid issues, hot flashes or night sweats. Psych:  No mood changes, depression or anxiety. Pain:  No focal pain. Review of systems:  All other systems reviewed and found to be negative.  Physical Exam:  Blood pressure (!) 145/83, pulse 66, temperature (!) 96.8 F (36 C), temperature source Tympanic, resp. rate 20, weight 141 lb 9 oz (64.2 kg). GENERAL:  Well developed, well nourished, woman sitting comfortably in the exam room in no acute distress. MENTAL STATUS:  Alert and oriented to person, place and time. HEAD:  Wearing a crochet cap. Short brown hair with graying.  Normocephalic, atraumatic, face symmetric, no Cushingoid features. EYES:  Blue eyes.  Pupils equal round and reactive to light and accomodation.  No conjunctivitis or scleral icterus. ENT:  Oropharynx clear without lesion.  Tongue normal. Mucous membranes moist.  RESPIRATORY:  Clear to auscultation without rales, wheezes or rhonchi. CARDIOVASCULAR:  Regular rate and  rhythm without murmur, rub or gallop. BREAST:  Right breast with superior fibrocystic changes.  No masses, skin changes or nipple discharge.  Left sided mastectomy site without masses, overlying erythema or induration. ABDOMEN:  Soft, non-tender, with active bowel sounds, and no hepatosplenomegaly.  No masses. SKIN:  Pale.  No rashes, ulcers  or lesions. EXTREMITIES:  Trace lower extremity edema.  No skin discoloration or tenderness.  No palpable cords. LYMPH NODES: No palpable cervical, supraclavicular, axillary or inguinal adenopathy  NEUROLOGICAL: Unremarkable. PSYCH:  Appropriate.   Imaging studies: 11/21/2015:  Left mammogram and ultrasound revealed a 4.2 x 2.4 x 2.6 cm irregular hypoechoic mass in the 1-2 o'clock position in the left breast 3 cm from the nipple.  Left axillary lymph nodes appeared normal.   12/25/2015:  Breast MRI revealed a 6.2 cm area of abnormal enhancement in the upper outer quadrant of the left breast.  The right breast revealed no mass or abnormal enhancement. 12/08/2016:  Right mammogram revealed no evidence of malignancy. 06/04/2017:  Chest CT revealed no pneumonia, edema, collapse or effusion.  There was a right middle lobe calcified granuloma. There was a stable 8 mm scar posterior aspect of the right upper lobe. 06/11/2017:  Abdomen and pelvic CT revealed no acute process in the abdomen or pelvis.  There was possible bladder wall thickening (? under distention).  There was nonspecific presacral and posterior pelvic edema.  There was central uterine hypoattenuation (subtle). 06/16/2017:  Head MRI without contrast revealed no acute intracranial abnormality.  There was wide spread changes c/w advanced small vessel disease.   06/22/2017:  Bone survey revealed no focal lytic or sclerotic lesion.   Clinical Support on 06/29/2017  Component Date Value Ref Range Status  . Folate 06/29/2017 13.6  >5.9 ng/mL Final   Performed at [facility], [address] [place], [place] [address]  . Sodium 06/29/2017 128* 135 - 145 mmol/L Final  . Potassium 06/29/2017 3.8  3.5 - 5.1 mmol/L Final  . Chloride 06/29/2017 97* 101 - 111 mmol/L Final  . CO2 06/29/2017 24  22 - 32 mmol/L Final  . Glucose, Bld 06/29/2017 99  65 - 99 mg/dL Final  . BUN 06/29/2017 35* 6 - 20 mg/dL Final  . Creatinine, Ser 06/29/2017 1.41* 0.44 - 1.00 mg/dL Final  . Calcium 06/29/2017 12.2* 8.9 - 10.3 mg/dL Final  . GFR calc non Af Amer 06/29/2017 36* >60 mL/min Final  . GFR calc Af Amer 06/29/2017 42* >60 mL/min Final   Comment: (NOTE) The eGFR has been calculated using the CKD EPI equation. This calculation has not been validated in all clinical situations. eGFR's persistently <60 mL/min signify possible Chronic Kidney Disease.   Georgiann Hahn gap 06/29/2017 7  5 - 15 Final   Performed at [facility], [address] [place], [place] [address]  . Free T4 06/29/2017 1.14* 0.61 - 1.12 ng/dL Final   Comment: (NOTE) Biotin ingestion may interfere with free T4 tests. If the results are inconsistent with the TSH level, previous test results, or the clinical presentation, then consider biotin interference. If needed, order repeat testing after stopping biotin. Performed at [facility], [address] [place], [place] [address]   . WBC 06/29/2017 6.0  3.6 - 11.0 K/uL Final  . RBC 06/29/2017 2.54* 3.80 - 5.20 MIL/uL Final  . Hemoglobin 06/29/2017 8.6* 12.0 - 16.0 g/dL Final  . HCT 06/29/2017 24.9* 35.0 - 47.0 % Final  . MCV 06/29/2017 98.0  80.0 - 100.0 fL Final  . MCH 06/29/2017 33.7  26.0 - 34.0 pg Final  . MCHC 06/29/2017 34.4  32.0 - 36.0 g/dL Final  . RDW 06/29/2017 13.3  11.5 - 14.5 % Final  . Platelets 06/29/2017 369  150 - 440 K/uL Final  . Neutrophils Relative % 06/29/2017 59  %  Final  . Neutro Abs 06/29/2017 3.5  1.4 - 6.5 K/uL Final  . Lymphocytes Relative 06/29/2017 20  % Final  . Lymphs Abs 06/29/2017 1.2  1.0 - 3.6 K/uL Final  . Monocytes Relative  06/29/2017 14  % Final  . Monocytes Absolute 06/29/2017 0.8  0.2 - 0.9 K/uL Final  . Eosinophils Relative 06/29/2017 6  % Final  . Eosinophils Absolute 06/29/2017 0.4  0 - 0.7 K/uL Final  . Basophils Relative 06/29/2017 1  % Final  . Basophils Absolute 06/29/2017 0.1  0 - 0.1 K/uL Final   Performed at [facility], [address], [place], [place] [address]  . Blood Bank Specimen 06/29/2017 SAMPLE AVAILABLE FOR TESTING   Final  . Sample Expiration 06/29/2017    Final                   Value:07/02/2017 Performed at [facility], [address], [place], [place] [address]     Assessment:  Bianca Shaw is a 72 y.o. female with stage IIA (T2N0) left breast cancer s/p mastectomy with sentinel lymph node biopsy on 12/26/2015.   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  Margins were negative.  Two sentinel lymph nodes were negative for macrometastasis, but with isolated tumor cells on IHC stains.  Three additional lymph nodes were positive for isolated tumor cells on IHC.  Tumor was ER positive (> 90%), PR positive (> 90%), and Her2/neu 2+ (eqivocal).  Her2/neu by FISH was negative.  Pathologic stage was pT2 pN0(i+).  MammaPrint testing revealed low risk luminal type A. There was a 97.8% probability of being disease free at 10 years with hormonal therapy.   Right mammogram on 12/08/2016 revealed no evidence of malignancy.  Exam in 11/2016 revealed a 4 mm nodule in the left axillae above the mastectomy incision.  Excision biopsy on 12/15/2016 revealed fat necrosis and no malignancy.  Imaging studies (chest, abdomen, and pelvic CT, head MRI, and bone survey) in 05/2017 revealed no evidence of metastatic disease.  She began tamoxifen on 02/04/2016.  She is tolerating it well.  CA27.29 has been followed: 27.3 on 12/10/2015, 19.4 on 05/30/2016, 16.4 on 09/07/2016, 17.2 on 12/08/2016, and 21.3 on 04/09/2017.  Bone density  on 11/05/2015 revealed osteoporosis with a T score of -2.9 in the AP spine L1-L2 and -1.9 in the left femoral neck.  She started Fosamax in 10/2015.  She is on calcium and vitamin D.  She had a colonoscopy in 09/2015.  She denies any melena, hematochezia, hematuria or vaginal bleeding.  She began oral B12 in early 03/2017.  B12 was 295 on 06/09/2016, 299 on 12/08/2016, and 1585 on 05/24/2017.  Folate was 11.5 on 04/09/2017.  She has a normocytic anemia of unclear etiology.  Work-up on 12/08/2016 and 04/09/2017 revealed a low normal B12 with normal MMA thus r/o B12 deficiency.  SPEP revealed no monoclonal protein.  Free light chain ratio was 2.43 (0.26-1.65), unclear significance. Ferritin, iron saturation, and folate were normal.  TSH is normal.  Retic was 1.2% (inappropriately low) on 09/07/2016 and 1.1% on 06/23/2017.  Coombs was negative on 10/19/2016 and 06/24/2017.   24 hour urine (IFE) on 12/28/2016 revealed no monoclonal protein. 24 hour urine on 06/23/2017 revealed kappa free light chains 157 (1.35-24.19), lambda free light chains 6.99 (0.24-6.66), and free light chain ratio 22.46 (2.04-10.37).  Kappa free light chains were 159.9 (ratio 2.79) on 06/23/2017.  SPEP was normal on 06/23/2017.  She has a history of chronic hyponatremia (dating back to 07/2012).  She is followed by nephrology.  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  She is on fluid restriction. Cortisol and uric acid were normal on 06/24/2017.   She has scleroderma.  She has skin thickening at sites of abrasion.  She has hypercalcemia of unclear etiology.  PTH was 14 (low) and not c/w primary hyperparathyroidism.  TSH was normal.  PTH-rp on 06/23/2017 was < 2.0.  ACE level was 47 (normal) on 06/23/2017.  Calcitriol (vitamin D 1, 25 dihydroxy) was 75.3 (19.9-79.3).  Vitamin D, 25 hydroxy was 68.6 (30-100) on 06/22/2017.  She denies excess vitamin A and vitamin D intake.    Symptomatically, she is fatigued.  Appetite is poor.   She is losing weight.  Exam is stable .  Hematocrit is 24.9 with a hemoglobin of 8.6.  Plan: 1.  Discuss interval hospitalization.  Discuss hypercalcemia and hyponatremia.  Work-up to date reveals a normal SPEP and low level of kappa free light chains.  Discuss low retic count and anemia.  Discuss plan for bone marrow aspirate and biopsy r/o myeloma and amyloid. 2.  Labs today: CBC, BMP, free T4, folate, hold tube. 3.  Temporary hold on tamoxifen. 4.  Schedule bone marrow aspirate and biopsy. 5.  Continue to hold Fosamax, calcium and vitamin D. 6.  Follow-up as scheduled with nephrology, rheumatology, neurology. 7.  Consult endocrinology re: hypercalcemia. 8.  RTC 2 weeks after bone marrow for MD assessment and discussion regarding direction of therapy.    Lequita Asal, MD  06/29/2017, 4:35 PM

## 2017-06-29 NOTE — Telephone Encounter (Signed)
Called patient's spouse and gave him patient's appointment for bone marrow bx.  Scheduled for Tuesday,  07-06-17.  Patient instructed to arrive @ [place] @ 7:30 am.  Procedure @ 8:30 am.  Verbalized understanding.

## 2017-06-30 ENCOUNTER — Telehealth: Payer: Self-pay | Admitting: *Deleted

## 2017-06-30 LAB — CULTURE, BLOOD (ROUTINE X 2)
CULTURE: NO GROWTH
CULTURE: NO GROWTH
SPECIAL REQUESTS: ADEQUATE
Special Requests: ADEQUATE

## 2017-06-30 NOTE — Telephone Encounter (Signed)
Patient does not need blood transfusion.

## 2017-06-30 NOTE — Telephone Encounter (Signed)
CAll returned to Mr Briddell, but had to leave message on voice mail

## 2017-06-30 NOTE — Telephone Encounter (Signed)
Bianca Shaw and said his wife has three days to get a blood transfusion and he wants her set up for Friday. He said Dr C said she is borderline from the hospital. Her HGB yesterday was 8.9. Does she need to have transfusion? If not we need to let them know they can cut off her armband. Please advise

## 2017-07-01 ENCOUNTER — Telehealth: Payer: Self-pay | Admitting: *Deleted

## 2017-07-01 DIAGNOSIS — R531 Weakness: Secondary | ICD-10-CM | POA: Diagnosis not present

## 2017-07-01 NOTE — Telephone Encounter (Signed)
Per Gaspar Bidding, they have received the referral and it is on the doctor desk awaiting word from doctor. Mr Gotay informed of this and advised that he call to see if he can get the appt

## 2017-07-01 NOTE — Telephone Encounter (Signed)
I am not sure. I sent information over to Dr. Joycie Peek office the same day. I will resend the referral packet at lunch.

## 2017-07-01 NOTE — Telephone Encounter (Signed)
Mr Delrosario called and states that they still have not gotten a call with appointment for Endocrinology He is asking for help with this. Please advise

## 2017-07-05 ENCOUNTER — Inpatient Hospital Stay: Payer: PPO | Admitting: Family Medicine

## 2017-07-05 DIAGNOSIS — R768 Other specified abnormal immunological findings in serum: Secondary | ICD-10-CM | POA: Diagnosis not present

## 2017-07-05 DIAGNOSIS — L940 Localized scleroderma [morphea]: Secondary | ICD-10-CM | POA: Diagnosis not present

## 2017-07-05 DIAGNOSIS — R531 Weakness: Secondary | ICD-10-CM | POA: Diagnosis not present

## 2017-07-05 DIAGNOSIS — E871 Hypo-osmolality and hyponatremia: Secondary | ICD-10-CM | POA: Diagnosis not present

## 2017-07-06 ENCOUNTER — Ambulatory Visit
Admission: RE | Admit: 2017-07-06 | Discharge: 2017-07-06 | Disposition: A | Discharge status: Home / Self Care | Payer: PPO | Source: Ambulatory Visit | Attending: Urgent Care | Admitting: Urgent Care

## 2017-07-06 ENCOUNTER — Other Ambulatory Visit: Payer: Self-pay | Admitting: Radiology

## 2017-07-06 ENCOUNTER — Other Ambulatory Visit (HOSPITAL_COMMUNITY)
Admission: RE | Admit: 2017-07-06 | Disposition: A | Discharge status: Home / Self Care | Payer: PPO | Source: Ambulatory Visit | Attending: Hematology and Oncology | Admitting: Hematology and Oncology

## 2017-07-06 DIAGNOSIS — I10 Essential (primary) hypertension: Secondary | ICD-10-CM | POA: Insufficient documentation

## 2017-07-06 DIAGNOSIS — Z9841 Cataract extraction status, right eye: Secondary | ICD-10-CM | POA: Diagnosis not present

## 2017-07-06 DIAGNOSIS — Z9012 Acquired absence of left breast and nipple: Secondary | ICD-10-CM | POA: Insufficient documentation

## 2017-07-06 DIAGNOSIS — D649 Anemia, unspecified: Secondary | ICD-10-CM | POA: Insufficient documentation

## 2017-07-06 DIAGNOSIS — D72822 Plasmacytosis: Secondary | ICD-10-CM | POA: Diagnosis not present

## 2017-07-06 DIAGNOSIS — Z833 Family history of diabetes mellitus: Secondary | ICD-10-CM | POA: Diagnosis not present

## 2017-07-06 DIAGNOSIS — K219 Gastro-esophageal reflux disease without esophagitis: Secondary | ICD-10-CM | POA: Diagnosis not present

## 2017-07-06 DIAGNOSIS — Z79899 Other long term (current) drug therapy: Secondary | ICD-10-CM | POA: Diagnosis not present

## 2017-07-06 DIAGNOSIS — F419 Anxiety disorder, unspecified: Secondary | ICD-10-CM | POA: Diagnosis not present

## 2017-07-06 DIAGNOSIS — Z836 Family history of other diseases of the respiratory system: Secondary | ICD-10-CM | POA: Diagnosis not present

## 2017-07-06 DIAGNOSIS — Z90710 Acquired absence of both cervix and uterus: Secondary | ICD-10-CM | POA: Diagnosis not present

## 2017-07-06 DIAGNOSIS — Z853 Personal history of malignant neoplasm of breast: Secondary | ICD-10-CM | POA: Insufficient documentation

## 2017-07-06 DIAGNOSIS — Z841 Family history of disorders of kidney and ureter: Secondary | ICD-10-CM | POA: Insufficient documentation

## 2017-07-06 DIAGNOSIS — Z818 Family history of other mental and behavioral disorders: Secondary | ICD-10-CM | POA: Diagnosis not present

## 2017-07-06 DIAGNOSIS — Z87440 Personal history of urinary (tract) infections: Secondary | ICD-10-CM | POA: Diagnosis not present

## 2017-07-06 DIAGNOSIS — M810 Age-related osteoporosis without current pathological fracture: Secondary | ICD-10-CM | POA: Insufficient documentation

## 2017-07-06 DIAGNOSIS — Z955 Presence of coronary angioplasty implant and graft: Secondary | ICD-10-CM | POA: Insufficient documentation

## 2017-07-06 DIAGNOSIS — Z91018 Allergy to other foods: Secondary | ICD-10-CM | POA: Diagnosis not present

## 2017-07-06 DIAGNOSIS — Z8261 Family history of arthritis: Secondary | ICD-10-CM | POA: Diagnosis not present

## 2017-07-06 DIAGNOSIS — Z8249 Family history of ischemic heart disease and other diseases of the circulatory system: Secondary | ICD-10-CM | POA: Diagnosis not present

## 2017-07-06 DIAGNOSIS — C50412 Malignant neoplasm of upper-outer quadrant of left female breast: Secondary | ICD-10-CM

## 2017-07-06 DIAGNOSIS — E871 Hypo-osmolality and hyponatremia: Secondary | ICD-10-CM | POA: Diagnosis not present

## 2017-07-06 DIAGNOSIS — Z9889 Other specified postprocedural states: Secondary | ICD-10-CM | POA: Diagnosis not present

## 2017-07-06 DIAGNOSIS — Z8049 Family history of malignant neoplasm of other genital organs: Secondary | ICD-10-CM | POA: Insufficient documentation

## 2017-07-06 DIAGNOSIS — Z87891 Personal history of nicotine dependence: Secondary | ICD-10-CM | POA: Insufficient documentation

## 2017-07-06 DIAGNOSIS — Z170 Estrogen receptor positive status [ER+]: Secondary | ICD-10-CM | POA: Insufficient documentation

## 2017-07-06 DIAGNOSIS — Z7982 Long term (current) use of aspirin: Secondary | ICD-10-CM | POA: Insufficient documentation

## 2017-07-06 LAB — CBC WITH DIFFERENTIAL/PLATELET
BASOS PCT: 1 %
Basophils Absolute: 0.1 10*3/uL (ref 0–0.1)
Eosinophils Absolute: 0.3 10*3/uL (ref 0–0.7)
Eosinophils Relative: 6 %
HEMATOCRIT: 25.7 % — AB (ref 35.0–47.0)
HEMOGLOBIN: 8.6 g/dL — AB (ref 12.0–16.0)
LYMPHS ABS: 0.8 10*3/uL — AB (ref 1.0–3.6)
LYMPHS PCT: 14 %
MCH: 33.1 pg (ref 26.0–34.0)
MCHC: 33.6 g/dL (ref 32.0–36.0)
MCV: 98.3 fL (ref 80.0–100.0)
MONOS PCT: 14 %
Monocytes Absolute: 0.8 10*3/uL (ref 0.2–0.9)
NEUTROS ABS: 3.8 10*3/uL (ref 1.4–6.5)
NEUTROS PCT: 65 %
Platelets: 375 10*3/uL (ref 150–440)
RBC: 2.61 MIL/uL — ABNORMAL LOW (ref 3.80–5.20)
RDW: 13.9 % (ref 11.5–14.5)
WBC: 5.8 10*3/uL (ref 3.6–11.0)

## 2017-07-06 LAB — PROTIME-INR
INR: 0.95
Prothrombin Time: 12.6 seconds (ref 11.4–15.2)

## 2017-07-06 MED ORDER — LIDOCAINE HCL 1 % IJ SOLN
INTRAMUSCULAR | Status: AC | PRN
  Administered 2017-07-06: 9 mL

## 2017-07-06 MED ORDER — HEPARIN SOD (PORK) LOCK FLUSH 100 UNIT/ML IV SOLN
INTRAVENOUS | Status: AC
  Filled 2017-07-06: qty 5

## 2017-07-06 MED ORDER — MIDAZOLAM HCL 2 MG/2ML IJ SOLN
INTRAMUSCULAR | Status: AC | PRN
  Administered 2017-07-06 (×2): 1 mg via INTRAVENOUS

## 2017-07-06 MED ORDER — FENTANYL CITRATE (PF) 100 MCG/2ML IJ SOLN
INTRAMUSCULAR | Status: AC | PRN
  Administered 2017-07-06: 25 ug via INTRAVENOUS
  Administered 2017-07-06: 50 ug via INTRAVENOUS

## 2017-07-06 MED ORDER — SODIUM CHLORIDE 0.9 % IV SOLN
INTRAVENOUS | Status: DC
  Administered 2017-07-06: 20 mL/h via INTRAVENOUS

## 2017-07-06 MED ORDER — MIDAZOLAM HCL 5 MG/5ML IJ SOLN
INTRAMUSCULAR | Status: AC
  Filled 2017-07-06: qty 5

## 2017-07-06 MED ORDER — FENTANYL CITRATE (PF) 100 MCG/2ML IJ SOLN
INTRAMUSCULAR | Status: AC
  Filled 2017-07-06: qty 2

## 2017-07-06 NOTE — Discharge Instructions (Signed)
Moderate Conscious Sedation, Adult, Care After These instructions provide you with information about caring for yourself after your procedure. Your health care provider may also give you more specific instructions. Your treatment has been planned according to current medical practices, but problems sometimes occur. Call your health care provider if you have any problems or questions after your procedure. What can I expect after the procedure? After your procedure, it is common:  To feel sleepy for several hours.  To feel clumsy and have poor balance for several hours.  To have poor judgment for several hours.  To vomit if you eat too soon.  Follow these instructions at home: For at least 24 hours after the procedure:   Do not: ? Participate in activities where you could fall or become injured. ? Drive. ? Use heavy machinery. ? Drink alcohol. ? Take sleeping pills or medicines that cause drowsiness. ? Make important decisions or sign legal documents. ? Take care of children on your own.  Rest. Eating and drinking  Follow the diet recommended by your health care provider.  If you vomit: ? Drink water, juice, or soup when you can drink without vomiting. ? Make sure you have little or no nausea before eating solid foods. General instructions  Have a responsible adult stay with you until you are awake and alert.  Take over-the-counter and prescription medicines only as told by your health care provider.  If you smoke, do not smoke without supervision.  Keep all follow-up visits as told by your health care provider. This is important. Contact a health care provider if:  You keep feeling nauseous or you keep vomiting.  You feel light-headed.  You develop a rash.  You have a fever. Get help right away if:  You have trouble breathing. This information is not intended to replace advice given to you by your health care provider. Make sure you discuss any questions you have  with your health care provider. Document Released: 02/01/2013 Document Revised: 09/16/2015 Document Reviewed: 08/03/2015 Elsevier Interactive Patient Education  2018 [place]. Bone Marrow Aspiration and Bone Marrow Biopsy, Adult, Care After This sheet gives you information about how to care for yourself after your procedure. Your health care provider may also give you more specific instructions. If you have problems or questions, contact your health care provider. What can I expect after the procedure? After the procedure, it is common to have:  Mild pain and tenderness.  Swelling.  Bruising.  Follow these instructions at home:  Take over-the-counter or prescription medicines only as told by your health care provider.  Do not take baths, swim, or use a hot tub until your health care provider approves. Ask if you can take a shower or have a sponge bath.  Follow instructions from your health care provider about how to take care of the puncture site. Make sure you: ? Wash your hands with soap and water before you change your bandage (dressing). If soap and water are not available, use hand sanitizer. ? Change your dressing as told by your health care provider.  Check your puncture siteevery day for signs of infection. Check for: ? More redness, swelling, or pain. ? More fluid or blood. ? Warmth. ? Pus or a bad smell.  Return to your normal activities as told by your health care provider. Ask your health care provider what activities are safe for you.  Do not drive for 24 hours if you were given a medicine to help you relax (sedative).  Keep all follow-up visits as told by your health care provider. This is important. °Contact a health care provider if: °· You have more redness, swelling, or pain around the puncture site. °· You have more fluid or blood coming from the puncture site. °· Your puncture site feels warm to the touch. °· You have pus or a bad smell coming from the  puncture site. °· You have a fever. °· Your pain is not controlled with medicine. °This information is not intended to replace advice given to you by your health care provider. Make sure you discuss any questions you have with your health care provider. °Document Released: 10/31/2004 Document Revised: 11/01/2015 Document Reviewed: 09/25/2015 °Elsevier Interactive Patient Education © 2018 [employer] ° °

## 2017-07-06 NOTE — H&P (Signed)
Chief Complaint:  chronic anemia  Referring Physician(s): corcoran    History of Present Illness: Bianca Shaw is a 72 y.o. female with prior breast ca, now with chronic anemia without clear etiology.  Also, h/o hyponatremia and hypercalcemia.  Here for BM asp and core bx. No complaints today. Clinical concern for myeloma vs amyloid  Past Medical History:  Diagnosis Date  . Anemia   . Anxiety disorder   . Breast cancer of upper-outer quadrant of left female breast ([place]) 11/2015   pT2 pN0(i+).;ER+; PR +, her 2 neu not overexpressed.  Mastectomy, SLN, Mammoprint: Low risk.   . Cancer ([place]) 12/03/2015   left breast/ INVASIVE LOBULAR CARCINOMA.   . Cough    lingering, mild, finished Prednisone and anitbiotic 11/03/15  . Family history of adverse reaction to anesthesia    sister - PONV  . GERD (gastroesophageal reflux disease)   . H/O: hysterectomy   . Hypertension   . Osteopenia   . Osteoporosis   . Pericarditis    diagnonsed June, 2010, unclear etiology as of yer  . Personal history of tobacco use, presenting hazards to health 10/31/2015  . Scleroderma ([facility])    ONLY ON SKIN-MILD  . UTI (lower urinary tract infection)   . Wears dentures    full upper    Past Surgical History:  Procedure Laterality Date  . BREAST BIOPSY Right 2012   core - neg  . BREAST BIOPSY Left 12/03/2015   INVASIVE LOBULAR CARCINOMA.   Marland Kitchen CATARACT EXTRACTION W/ INTRAOCULAR LENS IMPLANT Right   . COLONOSCOPY WITH PROPOFOL N/A 11/08/2015   Procedure: COLONOSCOPY WITH PROPOFOL;  Surgeon: Lucilla Lame, MD;  Location: [facility];  Service: Endoscopy;  Laterality: N/A;  . EVACUATION BREAST HEMATOMA Left 01/14/2016   Procedure: EVACUATION HEMATOMA BREAST;  Surgeon: Robert Bellow, MD;  Location: [facility];  Service: General;  Laterality: Left;  Marland Kitchen MASTECTOMY Left 2017   complete mastectomy  . MASTECTOMY W/ SENTINEL NODE BIOPSY Left 12/26/2015   Procedure: MASTECTOMY WITH SENTINEL LYMPH NODE  BIOPSY;  Surgeon: Robert Bellow, MD;  Location: [facility];  Service: General;  Laterality: Left;  . RIGHT/LEFT HEART CATH AND CORONARY ANGIOGRAPHY N/A 07/22/2016   Procedure: Right/Left Heart Cath and Coronary Angiography;  Surgeon: Minna Merritts, MD;  Location: [facility];  Service: Cardiovascular;  Laterality: N/A;  . TUBAL LIGATION    . VESICOVAGINAL FISTULA CLOSURE W/ TAH      Allergies: Pimenta and Tomato  Medications: Prior to Admission medications   Medication Sig Start Date End Date Taking? Authorizing Provider  albuterol (PROVENTIL) (2.5 MG/3ML) 0.083% nebulizer solution Take 3 mLs (2.5 mg total) by nebulization every 6 (six) hours as needed for wheezing or shortness of breath. 06/06/15  Yes Mar Daring, PA-C  ALPRAZolam Duanne Moron) 0.5 MG tablet 1/2 tablet twice daily as needed Patient taking differently: Take 0.25 mg by mouth 2 (two) times daily as needed for anxiety.  10/13/16  Yes Jerrol Banana., MD  aspirin EC 81 MG tablet Take 81 mg by mouth daily.   Yes [provider]  busPIRone (BUSPAR) 5 MG tablet 1 Tablet, Oral QHS Patient taking differently: Take 5 mg by mouth at bedtime.  10/13/16  Yes Jerrol Banana., MD  fluticasone [facility]) 50 MCG/ACT nasal spray Place 2 sprays into the nose daily as needed for allergies or rhinitis.    Yes [provider]  furosemide (LASIX) 40 MG  tablet Take 1 tablet (40 mg total) by mouth daily. 06/26/17  Yes Gladstone Lighter, MD  hydrALAZINE (APRESOLINE) 25 MG tablet Take 1 tablet (25 mg total) by mouth 2 (two) times daily. 04/29/17  Yes Jerrol Banana., MD  lisinopril (PRINIVIL,ZESTRIL) 20 MG tablet Take 1 tablet (20 mg total) by mouth daily. 06/26/17  Yes Gladstone Lighter, MD  metoprolol succinate (TOPROL-XL) 25 MG 24 hr tablet Take 1 tablet (25 mg total) by mouth daily. 05/24/17  Yes Jerrol Banana., MD  nitroGLYCERIN (NITROSTAT) 0.4 MG SL tablet Place 1 tablet (0.4 mg total) under  the tongue every 5 (five) minutes as needed for chest pain. 07/17/16  Yes Mody, Ulice Bold, MD  omeprazole (PRILOSEC) 20 MG capsule Take 20 mg by mouth daily as needed. For heartburn   Yes [provider]  ondansetron (ZOFRAN) 4 MG tablet Take 1 tablet (4 mg total) by mouth every 8 (eight) hours as needed for nausea or vomiting. 06/02/17  Yes Fenton Malling M, PA-C  potassium chloride SA (K-DUR,KLOR-CON) 20 MEQ tablet Take 1 tablet (20 mEq total) by mouth daily. 06/12/17  Yes Vaughan Basta, MD     Family History  Problem Relation Age of Onset  . Heart failure Mother   . Epilepsy Mother   . COPD Father   . Heart disease Father   . Anxiety disorder Sister   . Arthritis Brother   . Heart disease Brother   . Vaginal cancer Paternal Grandmother   . Heart attack Paternal Grandfather   . COPD Brother   . Kidney failure Brother   . COPD Brother   . Arthritis Sister   . Uterine cancer Unknown   . Diabetes Unknown   . Colon cancer Neg Hx   . Stomach cancer Neg Hx   . Breast cancer Neg Hx     Social History   Socioeconomic History  . Marital status: Married    Spouse name: Not on file  . Number of children: 2  . Years of education: Not on file  . Highest education level: Not on file  Social Needs  . Financial resource strain: Not on file  . Food insecurity - worry: Not on file  . Food insecurity - inability: Not on file  . Transportation needs - medical: Not on file  . Transportation needs - non-medical: Not on file  Occupational History  . Not on file  Tobacco Use  . Smoking status: Former Smoker    Packs/day: 0.75    Years: 40.00    Pack years: 30.00    Types: Cigarettes    Last attempt to quit: 11/07/2003    Years since quitting: 13.6  . Smokeless tobacco: Never Used  Substance and Sexual Activity  . Alcohol use: Yes    Alcohol/week: 0.6 - 1.8 oz    Types: 1 - 3 Glasses of wine per week    Comment: Occasionally.    . Drug use: No  . Sexual activity: Not  on file  Other Topics Concern  . Not on file  Social History Narrative   She drinks 1 to 2 caffeinated beverages a day    ECOG Status: 1 - Symptomatic but completely ambulatory  Review of Systems: A 12 point ROS discussed and pertinent positives are indicated in the HPI above.  All other systems are negative.  Review of Systems  Vital Signs: BP (!) 123/58   Pulse 63   Temp 97.7 F (36.5 C) (Oral)   Resp 20  Ht '5\' 1"'  (1.549 m)   Wt 141 lb (64 kg)   SpO2 100%   BMI 26.64 kg/m   Physical Exam  Constitutional: She is oriented to person, place, and time. She appears well-developed and well-nourished. No distress.  Eyes: Conjunctivae are normal. No scleral icterus.  Cardiovascular: Normal rate and regular rhythm.  No murmur heard. Pulmonary/Chest: Effort normal and breath sounds normal.  Abdominal: Soft. Bowel sounds are normal.  Musculoskeletal: Normal range of motion.  Neurological: She is alert and oriented to person, place, and time.  Skin: She is not diaphoretic.  Psychiatric: She has a normal mood and affect.    Imaging: Ct Abdomen Pelvis Wo Contrast  Result Date: 06/11/2017 CLINICAL DATA:  Nausea and vomiting daily for 3 weeks. Fever and cough for 2 weeks. Body aches. Weight loss. EXAM: CT ABDOMEN AND PELVIS WITHOUT CONTRAST TECHNIQUE: Multidetector CT imaging of the abdomen and pelvis was performed following the standard protocol without IV contrast. COMPARISON:  08/26/2012 abdominal CT.  No prior pelvic imaging. FINDINGS: Lower chest: Clear lung bases. Mild cardiomegaly. Right coronary artery atherosclerosis. Trace left pleural fluid. Tiny hiatal hernia. Hepatobiliary: Old granulomatous disease in the liver. Normal gallbladder, without biliary ductal dilatation. Pancreas: Hypoattenuation in the pancreatic head is likely due to fatty replacement. No pancreatic duct dilatation or acute inflammation. Spleen: Normal in size, without focal abnormality. Adrenals/Urinary Tract:  Normal adrenal glands. 1.4 cm interpolar right renal cyst. Normal left kidney. The bladder is partially collapsed. Apparent anterior bladder wall thickening is at least partially felt to be secondary. Example image 71/series 2. Stomach/Bowel: Normal remainder of the stomach. Scattered colonic diverticula. Normal terminal ileum and appendix. Normal small bowel. Vascular/Lymphatic: Aortic and branch vessel atherosclerosis. No abdominopelvic adenopathy. Reproductive: Central uterine hypoattenuation, including on sagittal image 50 and transverse image 63/series 2. No adnexal mass. Other: Mild pelvic floor laxity. No significant free fluid. presacral and posterior pelvic edema, including on image 71/series 2. No adjacent rectal wall thickening. Musculoskeletal: Osteopenia. Degenerative disc disease including at the lumbosacral junction. Disc bulges at L3-4 and L4-5. IMPRESSION: 1.  No acute process in the abdomen or pelvis. 2. Possible bladder wall thickening, at least partially felt to be due to underdistention. Consider correlation with urinalysis to exclude cystitis. 3. Nonspecific presacral and posterior pelvic edema. This is of indeterminate acuity, as the pelvis is not previously imaged. No adjacent rectal wall thickening to confirm proctitis. 4. Central uterine hypoattenuation is subtle. Correlate with postmenopausal bleeding and possibly nonemergent outpatient pelvic ultrasound. 5. Pelvic floor laxity. 6. Coronary artery atherosclerosis. Aortic Atherosclerosis (ICD10-I70.0). 7.  Tiny hiatal hernia. 8. Trace left pleural fluid. Electronically Signed   By: Abigail Miyamoto M.D.   On: 06/11/2017 15:58   Dg Chest 2 View  Result Date: 06/25/2017 CLINICAL DATA:  Fever. Cough. History of breast carcinoma in August 2017. EXAM: CHEST  2 VIEW COMPARISON:  06/22/2017 FINDINGS: Cardiac silhouette is mildly enlarged. No mediastinal or hilar masses. No evidence of adenopathy. Small stable granuloma in the right middle lobe.  Mildly prominent bronchovascular markings. Mild scarring at the apices. Lungs are otherwise clear. No pleural effusion or pneumothorax. Skeletal structures are demineralized but intact. IMPRESSION: No acute cardiopulmonary disease. Electronically Signed   By: Lajean Manes M.D.   On: 06/25/2017 10:45   Mr Brain Wo Contrast  Result Date: 06/16/2017 CLINICAL DATA:  72 year old female with dizziness, loss of balance, weakness, fatigue and confusion following hospitalization last month. EXAM: MRI HEAD WITHOUT CONTRAST TECHNIQUE: Multiplanar, multiecho pulse sequences of  the brain and surrounding structures were obtained without intravenous contrast. COMPARISON:  None. FINDINGS: Brain: Cerebral volume is within normal limits for age. No restricted diffusion to suggest acute infarction. No midline shift, mass effect, evidence of mass lesion, ventriculomegaly, extra-axial collection or acute intracranial hemorrhage. Cervicomedullary junction and pituitary are within normal limits. Widespread and confluent bilateral cerebral white matter T2 and FLAIR hyperintensity. Patchy involvement of the bilateral deep white matter capsules. Chronic lacunar infarcts in the bilateral thalami. Comparatively mild T2 heterogeneity in the bilateral basal ganglia. Probable small chronic lacunar infarcts in the left central pons (series 7, image 10), and the right cerebellar peduncle (series 7, image 7). The left cerebellar hemisphere remains normal. No cortical encephalomalacia or chronic cerebral blood products are identified. Vascular: Absence of the distal left vertebral artery flow void in the neck and posterior fossa (series 7, image 1) with evidence of reconstitution in the distal left V4 segment (series 7, image 6). Dominant appearing distal right vertebral artery flow void is preserved. And other major intracranial vascular flow voids are preserved. Skull and upper cervical spine: Chronic cervical disc degeneration. No significant  upper cervical spinal stenosis. Normal bone marrow signal. Sinuses/Orbits: Postoperative changes to the right globe. Otherwise normal orbits soft tissues Trace paranasal sinus mucosal thickening. Other: Mastoid air cells are clear. Visible internal auditory structures appear grossly normal. Scalp and face soft tissues appear negative. IMPRESSION: 1.  No acute intracranial abnormality. 2. 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 3. Widespread signal changes in the brain compatible with advanced chronic small vessel disease. Electronically Signed   By: Genevie Ann M.D.   On: 06/16/2017 14:38   Dg Bone Survey Met  Result Date: 06/22/2017 CLINICAL DATA:  Hypercalcemia, weakness EXAM: METASTATIC BONE SURVEY COMPARISON:  Chest CT 06/04/2017. CT abdomen and pelvis 06/11/2017. Chest x-ray 05/24/2017. FINDINGS: Calcified granuloma in the right mid lung. Lungs otherwise clear. Heart is normal size. No effusions. Degenerative changes in the cervical, thoracic and lumbar spine with disc space narrowing and spurring. No focal bone lesion or acute bony abnormality. No focal lytic or sclerotic bone lesion within the visualized skeleton. No acute bony abnormality. Aortoiliac atherosclerosis. No visible aneurysm. IMPRESSION: No focal suspicious lytic or sclerotic bone lesion. No acute bony abnormality. Electronically Signed   By: Rolm Baptise M.D.   On: 06/22/2017 20:09    Labs:  CBC: Recent Labs    06/11/17 0630 06/22/17 1724 06/24/17 0435 06/29/17 1250  WBC 6.7 6.9 5.0 6.0  HGB 10.3* 8.8* 8.1* 8.6*  HCT 29.3* 24.7* 23.4* 24.9*  PLT 268 267 281 369    COAGS: Recent Labs    07/16/16 1705  APTT 29    BMP: Recent Labs    06/23/17 0435 06/24/17 0435 06/25/17 0516 06/29/17 1250  NA 130* 129* 130* 128*  K 3.7 3.2* 3.7 3.8  CL 101 101 104 97*  CO2 24 24 20* 24  GLUCOSE 110* 91 97 99  BUN 20 21* 20 35*    CALCIUM 12.1* 11.2* 10.7* 12.2*  CREATININE 1.37* 1.28* 1.26* 1.41*  GFRNONAA 38* 41* 42* 36*  GFRAA 43* 47* 48* 42*    LIVER FUNCTION TESTS: Recent Labs    04/09/17 1134 05/24/17 1613 06/10/17 1809 06/22/17 1724  BILITOT 0.4 0.3 0.4 0.4  AST 25 79* 25 19  ALT 16 89* 36 24  ALKPHOS 40 130* 58 41  PROT 7.9 6.7 6.2* 6.8  ALBUMIN  3.9 3.4* 2.9* 3.1*    TUMOR MARKERS: No results for input(s): AFPTM, CEA, CA199, CHROMGRNA in the last 8760 hours.  Assessment and Plan:  Chronic anemia, hyponatremia and hypercalcemia of unclear etiology.  Concern for myeloma vs amyloid.  Plan for CT BM asp and core bx today.  Consent obtained.  Risks and benefits discussed with the patient including, but not limited to bleeding, infection, damage to adjacent structures or low yield requiring additional tests.  All of the patient's questions were answered, patient is agreeable to proceed. Consent signed and in chart.    Thank you for this interesting consult.  I greatly enjoyed meeting FARREN LANDA and look forward to participating in their care.  A copy of this report was sent to the requesting provider on this date.  Electronically Signed: Greggory Keen, MD 07/06/2017, 8:01 AM   I spent a total of  15 Minutes   in face to face in clinical consultation, greater than 50% of which was counseling/coordinating care for this patient with chronic anemia.

## 2017-07-06 NOTE — Procedures (Signed)
Anemia  S/p CT rt iliac BM asp and core bx  No comp Stable ebl 0 Path pending Full report in pacs

## 2017-07-07 DIAGNOSIS — D72822 Plasmacytosis: Secondary | ICD-10-CM | POA: Diagnosis not present

## 2017-07-12 ENCOUNTER — Inpatient Hospital Stay: Payer: PPO | Admitting: Hematology and Oncology

## 2017-07-13 DIAGNOSIS — N183 Chronic kidney disease, stage 3 (moderate): Secondary | ICD-10-CM | POA: Diagnosis not present

## 2017-07-13 DIAGNOSIS — I10 Essential (primary) hypertension: Secondary | ICD-10-CM | POA: Diagnosis not present

## 2017-07-13 DIAGNOSIS — E871 Hypo-osmolality and hyponatremia: Secondary | ICD-10-CM | POA: Diagnosis not present

## 2017-07-15 ENCOUNTER — Encounter (HOSPITAL_COMMUNITY): Payer: Self-pay | Admitting: Hematology and Oncology

## 2017-07-15 LAB — TISSUE HYBRIDIZATION (BONE MARROW)-NCBH

## 2017-07-15 LAB — CHROMOSOME ANALYSIS, BONE MARROW

## 2017-07-20 ENCOUNTER — Inpatient Hospital Stay: Payer: PPO | Admitting: Hematology and Oncology

## 2017-07-20 ENCOUNTER — Other Ambulatory Visit: Payer: Self-pay

## 2017-07-20 ENCOUNTER — Inpatient Hospital Stay: Payer: PPO

## 2017-07-20 ENCOUNTER — Encounter: Payer: Self-pay | Admitting: Hematology and Oncology

## 2017-07-20 DIAGNOSIS — C50412 Malignant neoplasm of upper-outer quadrant of left female breast: Secondary | ICD-10-CM | POA: Diagnosis not present

## 2017-07-20 DIAGNOSIS — E871 Hypo-osmolality and hyponatremia: Secondary | ICD-10-CM

## 2017-07-20 DIAGNOSIS — Z170 Estrogen receptor positive status [ER+]: Secondary | ICD-10-CM

## 2017-07-20 DIAGNOSIS — Z79810 Long term (current) use of selective estrogen receptor modulators (SERMs): Secondary | ICD-10-CM | POA: Diagnosis not present

## 2017-07-20 DIAGNOSIS — M810 Age-related osteoporosis without current pathological fracture: Secondary | ICD-10-CM

## 2017-07-20 DIAGNOSIS — D649 Anemia, unspecified: Secondary | ICD-10-CM

## 2017-07-20 DIAGNOSIS — Z9012 Acquired absence of left breast and nipple: Secondary | ICD-10-CM

## 2017-07-20 DIAGNOSIS — Z7189 Other specified counseling: Secondary | ICD-10-CM

## 2017-07-20 DIAGNOSIS — E538 Deficiency of other specified B group vitamins: Secondary | ICD-10-CM

## 2017-07-20 DIAGNOSIS — R7989 Other specified abnormal findings of blood chemistry: Secondary | ICD-10-CM

## 2017-07-20 DIAGNOSIS — D631 Anemia in chronic kidney disease: Secondary | ICD-10-CM

## 2017-07-20 DIAGNOSIS — N189 Chronic kidney disease, unspecified: Secondary | ICD-10-CM | POA: Insufficient documentation

## 2017-07-20 LAB — COMPREHENSIVE METABOLIC PANEL
ALBUMIN: 3.5 g/dL (ref 3.5–5.0)
ALT: 14 U/L (ref 14–54)
ANION GAP: 7 (ref 5–15)
AST: 19 U/L (ref 15–41)
Alkaline Phosphatase: 40 U/L (ref 38–126)
BUN: 20 mg/dL (ref 6–20)
CHLORIDE: 101 mmol/L (ref 101–111)
CO2: 24 mmol/L (ref 22–32)
Calcium: 9.1 mg/dL (ref 8.9–10.3)
Creatinine, Ser: 1.2 mg/dL — ABNORMAL HIGH (ref 0.44–1.00)
GFR calc Af Amer: 51 mL/min — ABNORMAL LOW (ref >60)
GFR calc non Af Amer: 44 mL/min — ABNORMAL LOW (ref >60)
GLUCOSE: 98 mg/dL (ref 65–99)
POTASSIUM: 4.3 mmol/L (ref 3.5–5.1)
SODIUM: 132 mmol/L — AB (ref 135–145)
Total Bilirubin: 0.3 mg/dL (ref 0.3–1.2)
Total Protein: 7.7 g/dL (ref 6.5–8.1)

## 2017-07-20 LAB — FERRITIN: Ferritin: 106 ng/mL (ref 11–307)

## 2017-07-20 NOTE — Progress Notes (Signed)
Here for follow up. Per pt overall doing" so much better "

## 2017-07-20 NOTE — Progress Notes (Signed)
[facility] day:  07/20/2017    Chief Complaint: Bianca Shaw is a 72 y.o. female with stage IIA left breast cancer and electrolytes abnormalities (hyponatremia and hypercalcemia) who is seen for review of bone marrow and discussion regarding direction of therapy.  HPI:  The patient was last seen in the medical oncology clinic on 06/29/2017.  At that time, she was seen after interval hospitalization for hyponatremia and hypercalcemia.  The etiology of her hypercalcemia was unclear.  Work-up to date revealed a normal SPEP and low level of kappa free light chains.  Retic count was low.  We discussed a bone marrow aspirate and biopsy r/o myeloma and amyloid.  Calcium, vitamin D , and Fosamax were being held.  Tamoxifen was held per rheumatology (? related to hypercalcemia).  Bone marrow aspirate and biopsy on 07/07/2017 revealed a variably cellular bone marrow with trilineage hematopoiesis.  There was slight plasmacytosis (4%).  Plasma cells were polyclonal for kappa and lambda light chains.  There were several lymphoid aggregates.  Congo stain for amyloid was negative.  Flow cytometry revealed no monoclonal B-cell population or abnormal T-cell population.  Cytogenetics were normal (71, XX).  She saw Dr. Melrose Nakayama, neurologist, on 07/01/2017.  Weakness had improved.  She saw Dr. Marlowe Sax, rheumatologist, on 07/05/2017.  CK was 30 (low).  LFTs were normal.  Aldolase was 2 (low).  She has follow-up in 6 weeks.  Patient is scheduled to see Dr. Lavone Orn on 09/15/2017.  During the interim, patient is doing "so much better" overall. Patient denies any breast symptoms today. She has not experienced any B symptoms or interval infections. Patient is eating well. Her weight is up 2 pounds.  Patient denies pain in the clinic today.   Past Medical History:  Diagnosis Date  . Anemia   . Anxiety disorder   . Breast cancer of upper-outer  quadrant of left female breast ([facility]) 11/2015   pT2 pN0(i+).;ER+; PR +, her 2 neu not overexpressed.  Mastectomy, SLN, Mammoprint: Low risk.   . Cancer ([facility]) 12/03/2015   left breast/ INVASIVE LOBULAR CARCINOMA.   . Cough    lingering, mild, finished Prednisone and anitbiotic 11/03/15  . Family history of adverse reaction to anesthesia    sister - PONV  . GERD (gastroesophageal reflux disease)   . H/O: hysterectomy   . Hypertension   . Osteopenia   . Osteoporosis   . Pericarditis    diagnonsed June, 2010, unclear etiology as of yer  . Personal history of tobacco use, presenting hazards to health 10/31/2015  . Scleroderma ([facility])    ONLY ON SKIN-MILD  . UTI (lower urinary tract infection)   . Wears dentures    full upper    Past Surgical History:  Procedure Laterality Date  . BREAST BIOPSY Right 2012   core - neg  . BREAST BIOPSY Left 12/03/2015   INVASIVE LOBULAR CARCINOMA.   Marland Kitchen CATARACT EXTRACTION W/ INTRAOCULAR LENS IMPLANT Right   . COLONOSCOPY WITH PROPOFOL N/A 11/08/2015   Procedure: COLONOSCOPY WITH PROPOFOL;  Surgeon: Lucilla Lame, MD;  Location: [facility];  Service: Endoscopy;  Laterality: N/A;  . EVACUATION BREAST HEMATOMA Left 01/14/2016   Procedure: EVACUATION HEMATOMA BREAST;  Surgeon: Robert Bellow, MD;  Location: [facility];  Service: General;  Laterality: Left;  Marland Kitchen MASTECTOMY Left 2017   complete mastectomy  . MASTECTOMY W/ SENTINEL NODE BIOPSY Left 12/26/2015   Procedure: MASTECTOMY WITH SENTINEL LYMPH NODE BIOPSY;  Surgeon: Robert Bellow, MD;  Location: [facility];  Service: General;  Laterality: Left;  . RIGHT/LEFT HEART CATH AND CORONARY ANGIOGRAPHY N/A 07/22/2016   Procedure: Right/Left Heart Cath and Coronary Angiography;  Surgeon: Minna Merritts, MD;  Location: [facility];  Service: Cardiovascular;  Laterality: N/A;  . TUBAL LIGATION    . VESICOVAGINAL FISTULA CLOSURE W/ TAH      Family History  Problem Relation Age of Onset  . Heart  failure Mother   . Epilepsy Mother   . COPD Father   . Heart disease Father   . Anxiety disorder Sister   . Arthritis Brother   . Heart disease Brother   . Vaginal cancer Paternal Grandmother   . Heart attack Paternal Grandfather   . COPD Brother   . Kidney failure Brother   . COPD Brother   . Arthritis Sister   . Uterine cancer Unknown   . Diabetes Unknown   . Colon cancer Neg Hx   . Stomach cancer Neg Hx   . Breast cancer Neg Hx     Social History:  reports that she quit smoking about 13 years ago. Her smoking use included cigarettes. She has a 30.00 pack-year smoking history. She has never used smokeless tobacco. She reports that she drinks about 0.6 - 1.8 oz of alcohol per week. She reports that she does not use drugs.  She has 5 brothers and 2 sisters.  She has 2 children who are alive and well.  She lives in [place] with her husband, Pat Patrick.  The patient is accompanied by her husband today.  Allergies:  Allergies  Allergen Reactions  . Pimenta Nausea And Vomiting  . Tomato     Current Medications: Current Outpatient Medications  Medication Sig Dispense Refill  . aspirin EC 81 MG tablet Take 81 mg by mouth daily.    . busPIRone (BUSPAR) 5 MG tablet 1 Tablet, Oral QHS (Patient taking differently: Take 5 mg by mouth at bedtime. ) 30 tablet 12  . furosemide (LASIX) 40 MG tablet Take 1 tablet (40 mg total) by mouth daily. 30 tablet 2  . hydrALAZINE (APRESOLINE) 25 MG tablet Take 1 tablet (25 mg total) by mouth 2 (two) times daily. 60 tablet 11  . lisinopril (PRINIVIL,ZESTRIL) 20 MG tablet Take 1 tablet (20 mg total) by mouth daily. 30 tablet 2  . metoprolol succinate (TOPROL-XL) 25 MG 24 hr tablet Take 1 tablet (25 mg total) by mouth daily. 90 tablet 3  . omeprazole (PRILOSEC) 20 MG capsule Take 20 mg by mouth daily as needed. For heartburn    . potassium chloride SA (K-DUR,KLOR-CON) 20 MEQ tablet Take 1 tablet (20 mEq total) by mouth daily. 30 tablet 0  . albuterol (PROVENTIL)  (2.5 MG/3ML) 0.083% nebulizer solution Take 3 mLs (2.5 mg total) by nebulization every 6 (six) hours as needed for wheezing or shortness of breath. (Patient not taking: Reported on 07/20/2017) 75 mL 12  . ALPRAZolam (XANAX) 0.5 MG tablet 1/2 tablet twice daily as needed (Patient not taking: Reported on 07/20/2017) 30 tablet 5  . fluticasone (FLONASE) 50 MCG/ACT nasal spray Place 2 sprays into the nose daily as needed for allergies or rhinitis.     Marland Kitchen nitroGLYCERIN (NITROSTAT) 0.4 MG SL tablet Place 1 tablet (0.4 mg total) under the tongue every 5 (five) minutes as needed for chest pain. (Patient not taking: Reported on 07/20/2017) 30 tablet 0  . ondansetron (ZOFRAN) 4 MG tablet Take 1 tablet (4 mg  total) by mouth every 8 (eight) hours as needed for nausea or vomiting. (Patient not taking: Reported on 07/20/2017) 20 tablet 0   No current facility-administered medications for this visit.     Review of Systems:  GENERAL:  Feels "so much better".  No energy.  No fevers or sweats.  Weight up 2 pounds. PERFORMANCE STATUS (ECOG): 0 HEENT:  No visual changes, runny nose, sore throat, mouth sores or tenderness. Lungs: No shortness of breath or cough.  No hemoptysis. Cardiac:  No chest pain, palpitations, orthopnea, or PND. GI:  Eating well.  Intermittent nausea.  No vomiting, diarrhea, constipation, melena or hematochezia.  Colonoscopy done 09/2015. GU:  No urgency, frequency, dysuria, or hematuria. Musculoskeletal:  Osteoporosis.  No back pain.  No joint pain.  No muscle tenderness. Extremities:  No pain or swelling. Skin:  Skin changes due to scleroderma.  s/p left mastectomy.  Area at mastectomy site of concern.  No rashes or skin changes. Neuro:  Sometimes "talks out of head" (no further episodes).  Poor memory.  No headache, numbness or weakness, balance or coordination issues. Endocrine:  No diabetes, thyroid issues, hot flashes or night sweats. Psych:  No mood changes, depression or anxiety. Pain:   No focal pain. Review of systems:  All other systems reviewed and found to be negative.  Physical Exam:  Blood pressure (!) 157/69, pulse 74, temperature (!) 96.6 F (35.9 C), temperature source Tympanic, resp. rate 18, weight 143 lb (64.9 kg). GENERAL:  Well developed, well nourished, woman sitting comfortably in the exam room in no acute distress. MENTAL STATUS:  Alert and oriented to person, place and time. HEAD:  Short brown hair with graying.  Normocephalic, atraumatic, face symmetric, no Cushingoid features. EYES:  Blue eyes.  Pupils equal round and reactive to light and accomodation.  No conjunctivitis or scleral icterus. ENT:  Oropharynx clear without lesion.  Tongue normal. Mucous membranes moist.  RESPIRATORY:  Clear to auscultation without rales, wheezes or rhonchi. CARDIOVASCULAR:  Regular rate and rhythm without murmur, rub or gallop. ABDOMEN:  Soft, non-tender, with active bowel sounds, and no hepatosplenomegaly.  No masses. SKIN:  Pale.  No rashes, ulcers or lesions. EXTREMITIES:  Trace lower extremity edema.  No skin discoloration or tenderness.  No palpable cords. LYMPH NODES: No palpable cervical, supraclavicular, axillary or inguinal adenopathy  NEUROLOGICAL: Unremarkable. PSYCH:  Appropriate.   Imaging studies: 11/21/2015:  Left mammogram and ultrasound revealed a 4.2 x 2.4 x 2.6 cm irregular hypoechoic mass in the 1-2 o'clock position in the left breast 3 cm from the nipple.  Left axillary lymph nodes appeared normal.   12/25/2015:  Breast MRI revealed a 6.2 cm area of abnormal enhancement in the upper outer quadrant of the left breast.  The right breast revealed no mass or abnormal enhancement. 12/08/2016:  Right mammogram revealed no evidence of malignancy. 06/04/2017:  Chest CT revealed no pneumonia, edema, collapse or effusion.  There was a right middle lobe calcified granuloma. There was a stable 8 mm scar posterior aspect of the right upper lobe. 06/11/2017:   Abdomen and pelvic CT revealed no acute process in the abdomen or pelvis.  There was possible bladder wall thickening (? under distention).  There was nonspecific presacral and posterior pelvic edema.  There was central uterine hypoattenuation (subtle). 06/16/2017:  Head MRI without contrast revealed no acute intracranial abnormality.  There was wide spread changes c/w advanced small vessel disease.   06/22/2017:  Bone survey revealed no focal lytic or sclerotic  lesion.   No visits with results within 3 Day(s) from this visit.  Latest known visit with results is:  Hospital Outpatient Visit on 07/06/2017  Component Date Value Ref Range Status  . WBC 07/06/2017 5.8  3.6 - 11.0 K/uL Final  . RBC 07/06/2017 2.61* 3.80 - 5.20 MIL/uL Final  . Hemoglobin 07/06/2017 8.6* 12.0 - 16.0 g/dL Final  . HCT 07/06/2017 25.7* 35.0 - 47.0 % Final  . MCV 07/06/2017 98.3  80.0 - 100.0 fL Final  . MCH 07/06/2017 33.1  26.0 - 34.0 pg Final  . MCHC 07/06/2017 33.6  32.0 - 36.0 g/dL Final  . RDW 07/06/2017 13.9  11.5 - 14.5 % Final  . Platelets 07/06/2017 375  150 - 440 K/uL Final  . Neutrophils Relative % 07/06/2017 65  % Final  . Neutro Abs 07/06/2017 3.8  1.4 - 6.5 K/uL Final  . Lymphocytes Relative 07/06/2017 14  % Final  . Lymphs Abs 07/06/2017 0.8* 1.0 - 3.6 K/uL Final  . Monocytes Relative 07/06/2017 14  % Final  . Monocytes Absolute 07/06/2017 0.8  0.2 - 0.9 K/uL Final  . Eosinophils Relative 07/06/2017 6  % Final  . Eosinophils Absolute 07/06/2017 0.3  0 - 0.7 K/uL Final  . Basophils Relative 07/06/2017 1  % Final  . Basophils Absolute 07/06/2017 0.1  0 - 0.1 K/uL Final   Performed at [facility], [address] [place], [place] [address]  . Prothrombin Time 07/06/2017 12.6  11.4 - 15.2 seconds Final  . INR 07/06/2017 0.95   Final   Performed at [facility], [address] [place], [place] [address]    Assessment:  Bianca Shaw is a 72 y.o. female with stage IIA (T2N0)  left breast cancer s/p mastectomy with sentinel lymph node biopsy on 12/26/2015.   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  Margins were negative.  Two sentinel lymph nodes were negative for macrometastasis, but with isolated tumor cells on IHC stains.  Three additional lymph nodes were positive for isolated tumor cells on IHC.  Tumor was ER positive (> 90%), PR positive (> 90%), and Her2/neu 2+ (eqivocal).  Her2/neu by FISH was negative.  Pathologic stage was pT2 pN0(i+).  MammaPrint testing revealed low risk luminal type A. There was a 97.8% probability of being disease free at 10 years with hormonal therapy.   Right mammogram on 12/08/2016 revealed no evidence of malignancy.  Exam in 11/2016 revealed a 4 mm nodule in the left axillae above the mastectomy incision.  Excision biopsy on 12/15/2016 revealed fat necrosis and no malignancy.  Imaging studies (chest, abdomen, and pelvic CT, head MRI, and bone survey) in 05/2017 revealed no evidence of metastatic disease.  She began tamoxifen on 02/04/2016.  She is tolerating it well.  CA27.29 has been followed: 27.3 on 12/10/2015, 19.4 on 05/30/2016, 16.4 on 09/07/2016, 17.2 on 12/08/2016, and 21.3 on 04/09/2017.  Bone density on 11/05/2015 revealed osteoporosis with a T score of -2.9 in the AP spine L1-L2 and -1.9 in the left femoral neck.  She started Fosamax in 10/2015.  She is on calcium and vitamin D.  She had a colonoscopy in 09/2015.  She denies any melena, hematochezia, hematuria or vaginal bleeding.  She began oral B12 in early 03/2017.  B12 was 295 on 06/09/2016, 299 on 12/08/2016, and 1585 on 05/24/2017.  Folate was 11.5 on 04/09/2017.  She has a normocytic anemia.  She may have anemia of  chronic renal disease.  Work-up on 12/08/2016 and 04/09/2017 revealed a low normal B12 with normal MMA thus r/o B12 deficiency.  SPEP revealed no monoclonal protein.  Free light chain ratio was 2.43  (0.26-1.65), unclear significance. Ferritin, iron saturation, and folate were normal.  TSH is normal.  Retic was 1.2% (inappropriately low) on 09/07/2016 and 1.1% on 06/23/2017.  Coombs was negative on 10/19/2016 and 06/24/2017.  Creatinine was 1.41 (CrCl 36 ml/min) on 06/29/2017.    24 hour urine (IFE) on 12/28/2016 revealed no monoclonal protein. 24 hour urine on 06/23/2017 revealed kappa free light chains 157 (1.35-24.19), lambda free light chains 6.99 (0.24-6.66), and free light chain ratio 22.46 (2.04-10.37).  Kappa free light chains were 159.9 (ratio 2.79) on 06/23/2017.  SPEP was normal on 06/23/2017.  Bone marrow aspirate and biopsy on 07/07/2017 revealed a variably cellular bone marrow with trilineage hematopoiesis.  There was slight plasmacytosis (4%).  Plasma cells were polyclonal for kappa and lambda light chains.  There were several lymphoid aggregates.  Congo stain for amyloid was negative.  Flow cytometry revealed no monoclonal B-cell population or abnormal T-cell population.  Cytogenetics were normal (63, XX).  She has a history of chronic hyponatremia (dating back to 07/2012).  She is followed by nephrology.  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  She is on fluid restriction. Cortisol and uric acid were normal on 06/24/2017.   She has scleroderma.  She has skin thickening at sites of abrasion.  She has hypercalcemia of unclear etiology.  PTH was 14 (low) and not c/w primary hyperparathyroidism.  TSH was normal.  PTH-rp on 06/23/2017 was < 2.0.  ACE level was 47 (normal) on 06/23/2017.  Calcitriol (vitamin D 1, 25 dihydroxy) was 75.3 (19.9-79.3).  Vitamin D, 25 hydroxy was 68.6 (30-100) on 06/22/2017.  She denies excess vitamin A and vitamin D intake.    Symptomatically, she is feeling better. She has gained weight.  Exam is stable .  Hematocrit is 25.7 with a hemoglobin of 8.6.  Plan: 1.  Labs today: epo level, ferritin, CMP  2.  Review interval bone marrow.  No evidence of  malignancy, plasma cell neoplasm or amyloid. 3.  Discuss anemia of chronic disease. Discuss checking epo to assess for endogenous hormone levels. If epo low, consider Procrit.  4.  Preauthorize Procrit 5.  Restart tamoxifen 20 mg daily.  6.  Follow up with nephrology as scheduled for repeat labs in 2 weeks 7.  Discuss follow up with endocrinology (Dr. Gabriel Carina).  MD to contact Dr Gabriel Carina. 8.  RTC 2 months for MD assessment, labs (CBC with diff, CMP), and +/- Procrit.    Honor Loh, NP  07/20/2017, 12:01 PM  I saw and evaluated the patient, participating in the key portions of the service and reviewing pertinent diagnostic studies and records.  I reviewed the nurse practitioner's note and agree with the findings and the plan.  Multiple questions were asked by the patient and answered.   Nolon Stalls, MD 07/20/2017,12:01 PM

## 2017-07-21 LAB — ERYTHROPOIETIN: ERYTHROPOIETIN: 5.8 m[IU]/mL (ref 2.6–18.5)

## 2017-08-02 ENCOUNTER — Other Ambulatory Visit: Payer: Self-pay | Admitting: Hematology and Oncology

## 2017-08-02 DIAGNOSIS — Z170 Estrogen receptor positive status [ER+]: Principal | ICD-10-CM

## 2017-08-02 DIAGNOSIS — D649 Anemia, unspecified: Secondary | ICD-10-CM

## 2017-08-02 DIAGNOSIS — C50412 Malignant neoplasm of upper-outer quadrant of left female breast: Secondary | ICD-10-CM

## 2017-08-12 ENCOUNTER — Other Ambulatory Visit: Payer: Self-pay | Admitting: Hematology and Oncology

## 2017-08-12 ENCOUNTER — Inpatient Hospital Stay (HOSPITAL_BASED_OUTPATIENT_CLINIC_OR_DEPARTMENT_OTHER): Payer: PPO | Admitting: Hematology and Oncology

## 2017-08-12 ENCOUNTER — Inpatient Hospital Stay: Payer: PPO | Attending: Hematology and Oncology

## 2017-08-12 ENCOUNTER — Encounter: Payer: Self-pay | Admitting: Hematology and Oncology

## 2017-08-12 ENCOUNTER — Other Ambulatory Visit: Payer: Self-pay

## 2017-08-12 ENCOUNTER — Inpatient Hospital Stay: Payer: PPO

## 2017-08-12 ENCOUNTER — Telehealth: Payer: Self-pay | Admitting: *Deleted

## 2017-08-12 VITALS — BP 165/70 | HR 66 | Temp 96.7°F | Resp 18 | Wt 144.8 lb

## 2017-08-12 DIAGNOSIS — N641 Fat necrosis of breast: Secondary | ICD-10-CM

## 2017-08-12 DIAGNOSIS — M349 Systemic sclerosis, unspecified: Secondary | ICD-10-CM | POA: Insufficient documentation

## 2017-08-12 DIAGNOSIS — Z9012 Acquired absence of left breast and nipple: Secondary | ICD-10-CM | POA: Insufficient documentation

## 2017-08-12 DIAGNOSIS — D649 Anemia, unspecified: Secondary | ICD-10-CM

## 2017-08-12 DIAGNOSIS — Z7982 Long term (current) use of aspirin: Secondary | ICD-10-CM | POA: Diagnosis not present

## 2017-08-12 DIAGNOSIS — Z170 Estrogen receptor positive status [ER+]: Secondary | ICD-10-CM | POA: Diagnosis not present

## 2017-08-12 DIAGNOSIS — D631 Anemia in chronic kidney disease: Secondary | ICD-10-CM

## 2017-08-12 DIAGNOSIS — N189 Chronic kidney disease, unspecified: Secondary | ICD-10-CM

## 2017-08-12 DIAGNOSIS — Z87891 Personal history of nicotine dependence: Secondary | ICD-10-CM | POA: Insufficient documentation

## 2017-08-12 DIAGNOSIS — K219 Gastro-esophageal reflux disease without esophagitis: Secondary | ICD-10-CM | POA: Insufficient documentation

## 2017-08-12 DIAGNOSIS — C50412 Malignant neoplasm of upper-outer quadrant of left female breast: Secondary | ICD-10-CM

## 2017-08-12 DIAGNOSIS — I10 Essential (primary) hypertension: Secondary | ICD-10-CM | POA: Insufficient documentation

## 2017-08-12 DIAGNOSIS — Z79899 Other long term (current) drug therapy: Secondary | ICD-10-CM | POA: Insufficient documentation

## 2017-08-12 DIAGNOSIS — E871 Hypo-osmolality and hyponatremia: Secondary | ICD-10-CM

## 2017-08-12 DIAGNOSIS — F419 Anxiety disorder, unspecified: Secondary | ICD-10-CM | POA: Insufficient documentation

## 2017-08-12 DIAGNOSIS — E538 Deficiency of other specified B group vitamins: Secondary | ICD-10-CM

## 2017-08-12 DIAGNOSIS — Z90710 Acquired absence of both cervix and uterus: Secondary | ICD-10-CM | POA: Insufficient documentation

## 2017-08-12 DIAGNOSIS — M818 Other osteoporosis without current pathological fracture: Secondary | ICD-10-CM

## 2017-08-12 DIAGNOSIS — Z7189 Other specified counseling: Secondary | ICD-10-CM

## 2017-08-12 LAB — COMPREHENSIVE METABOLIC PANEL
ALT: 11 U/L — ABNORMAL LOW (ref 14–54)
AST: 19 U/L (ref 15–41)
Albumin: 3.7 g/dL (ref 3.5–5.0)
Alkaline Phosphatase: 42 U/L (ref 38–126)
Anion gap: 6 (ref 5–15)
BUN: 22 mg/dL — ABNORMAL HIGH (ref 6–20)
CO2: 23 mmol/L (ref 22–32)
Calcium: 9.1 mg/dL (ref 8.9–10.3)
Chloride: 98 mmol/L — ABNORMAL LOW (ref 101–111)
Creatinine, Ser: 1.2 mg/dL — ABNORMAL HIGH (ref 0.44–1.00)
GFR calc Af Amer: 51 mL/min — ABNORMAL LOW (ref >60)
GFR calc non Af Amer: 44 mL/min — ABNORMAL LOW (ref >60)
Glucose, Bld: 106 mg/dL — ABNORMAL HIGH (ref 65–99)
Potassium: 4.1 mmol/L (ref 3.5–5.1)
Sodium: 127 mmol/L — ABNORMAL LOW (ref 135–145)
Total Bilirubin: 0.3 mg/dL (ref 0.3–1.2)
Total Protein: 7.4 g/dL (ref 6.5–8.1)

## 2017-08-12 LAB — CBC WITH DIFFERENTIAL/PLATELET
Basophils Absolute: 0.1 K/uL (ref 0–0.1)
Basophils Relative: 1 %
Eosinophils Absolute: 0.2 K/uL (ref 0–0.7)
Eosinophils Relative: 4 %
HCT: 24.7 % — ABNORMAL LOW (ref 35.0–47.0)
Hemoglobin: 8.5 g/dL — ABNORMAL LOW (ref 12.0–16.0)
Lymphocytes Relative: 23 %
Lymphs Abs: 1.3 K/uL (ref 1.0–3.6)
MCH: 34.6 pg — ABNORMAL HIGH (ref 26.0–34.0)
MCHC: 34.5 g/dL (ref 32.0–36.0)
MCV: 100.3 fL — ABNORMAL HIGH (ref 80.0–100.0)
Monocytes Absolute: 0.9 K/uL (ref 0.2–0.9)
Monocytes Relative: 16 %
Neutro Abs: 3.1 K/uL (ref 1.4–6.5)
Neutrophils Relative %: 56 %
Platelets: 275 K/uL (ref 150–440)
RBC: 2.47 MIL/uL — ABNORMAL LOW (ref 3.80–5.20)
RDW: 13.6 % (ref 11.5–14.5)
WBC: 5.5 K/uL (ref 3.6–11.0)

## 2017-08-12 MED ORDER — EPOETIN ALFA 10000 UNIT/ML IJ SOLN
10000.0000 [IU] | Freq: Once | INTRAMUSCULAR | Status: AC
  Administered 2017-08-12: 10000 [IU] via SUBCUTANEOUS
  Filled 2017-08-12: qty 2

## 2017-08-12 NOTE — Progress Notes (Signed)
Here for follow up. Overall stated going"real good "   Repeat BP @ 1520 pm was  174/76

## 2017-08-12 NOTE — Progress Notes (Signed)
[facility] day:  08/12/2017    Chief Complaint: Bianca Shaw is a 72 y.o. female with stage IIA left breast cancer and electrolytes abnormalities (hyponatremia and hypercalcemia) who is seen for review of bone marrow and discussion regarding direction of therapy.  HPI:  The patient was last seen in the medical oncology clinic on 07/20/2017.  At that time, she was feeling better. She had gained weight.  Exam was stable .  Hematocrit was 25.7 with a hemoglobin of 8.6.  Epo level was 5.8.  Bone marrow revealed no evidence of malignancy, plasma cell neoplasm or amyloid.  She was felt to have anemia of chronic disease.  We discussed consideration of Procrit.  She restarted tamoxifen.  She was to have followed up with Dr. Gabriel Carina. Patient has not been seen as of yet. Initial consult scheduled on 09/15/2017.  During the interim, patient has been doing well overall. She notes that her energy has increased. Patient has no breast concerns. She verbalizes no B symptoms or interval infections. Patient performs monthly self breast examinations as recommended. She continues on her prescribed tamoxifen.   Patient is eating well. Her weight has increased by 1 pound. She denies pain in the clinic today.    Past Medical History:  Diagnosis Date  . Anemia   . Anxiety disorder   . Breast cancer of upper-outer quadrant of left female breast ([place]) 11/2015   pT2 pN0(i+).;ER+; PR +, her 2 neu not overexpressed.  Mastectomy, SLN, Mammoprint: Low risk.   . Cancer ([place]) 12/03/2015   left breast/ INVASIVE LOBULAR CARCINOMA.   . Cough    lingering, mild, finished Prednisone and anitbiotic 11/03/15  . Family history of adverse reaction to anesthesia    sister - PONV  . GERD (gastroesophageal reflux disease)   . H/O: hysterectomy   . Hypertension   . Osteopenia   . Osteoporosis   . Pericarditis    diagnonsed June, 2010, unclear etiology as of yer  . Personal history  of tobacco use, presenting hazards to health 10/31/2015  . Scleroderma ([facility])    ONLY ON SKIN-MILD  . UTI (lower urinary tract infection)   . Wears dentures    full upper    Past Surgical History:  Procedure Laterality Date  . BREAST BIOPSY Right 2012   core - neg  . BREAST BIOPSY Left 12/03/2015   INVASIVE LOBULAR CARCINOMA.   Marland Kitchen CATARACT EXTRACTION W/ INTRAOCULAR LENS IMPLANT Right   . COLONOSCOPY WITH PROPOFOL N/A 11/08/2015   Procedure: COLONOSCOPY WITH PROPOFOL;  Surgeon: Lucilla Lame, MD;  Location: [facility];  Service: Endoscopy;  Laterality: N/A;  . EVACUATION BREAST HEMATOMA Left 01/14/2016   Procedure: EVACUATION HEMATOMA BREAST;  Surgeon: Robert Bellow, MD;  Location: [facility];  Service: General;  Laterality: Left;  Marland Kitchen MASTECTOMY Left 2017   complete mastectomy  . MASTECTOMY W/ SENTINEL NODE BIOPSY Left 12/26/2015   Procedure: MASTECTOMY WITH SENTINEL LYMPH NODE BIOPSY;  Surgeon: Robert Bellow, MD;  Location: [facility];  Service: General;  Laterality: Left;  . RIGHT/LEFT HEART CATH AND CORONARY ANGIOGRAPHY N/A 07/22/2016   Procedure: Right/Left Heart Cath and Coronary Angiography;  Surgeon: Minna Merritts, MD;  Location: [facility];  Service: Cardiovascular;  Laterality: N/A;  . TUBAL LIGATION    . VESICOVAGINAL FISTULA CLOSURE W/ TAH      Family History  Problem Relation Age of Onset  . Heart failure Mother   .  Epilepsy Mother   . COPD Father   . Heart disease Father   . Anxiety disorder Sister   . Arthritis Brother   . Heart disease Brother   . Vaginal cancer Paternal Grandmother   . Heart attack Paternal Grandfather   . COPD Brother   . Kidney failure Brother   . COPD Brother   . Arthritis Sister   . Uterine cancer Unknown   . Diabetes Unknown   . Colon cancer Neg Hx   . Stomach cancer Neg Hx   . Breast cancer Neg Hx     Social History:  reports that she quit smoking about 13 years ago. Her smoking use included cigarettes. She has a  30.00 pack-year smoking history. She has never used smokeless tobacco. She reports that she drinks about 0.6 - 1.8 oz of alcohol per week. She reports that she does not use drugs.  She has 5 brothers and 2 sisters.  She has 2 children who are alive and well.  She lives in [place] with her husband, Pat Patrick.  The patient is accompanied by her husband today.  Allergies:  Allergies  Allergen Reactions  . Pimenta Nausea And Vomiting  . Tomato     Current Medications: Current Outpatient Medications  Medication Sig Dispense Refill  . albuterol (PROVENTIL) (2.5 MG/3ML) 0.083% nebulizer solution Take 3 mLs (2.5 mg total) by nebulization every 6 (six) hours as needed for wheezing or shortness of breath. 75 mL 12  . aspirin EC 81 MG tablet Take 81 mg by mouth daily.    . busPIRone (BUSPAR) 5 MG tablet 1 Tablet, Oral QHS (Patient taking differently: Take 5 mg by mouth at bedtime. ) 30 tablet 12  . fluticasone (FLONASE) 50 MCG/ACT nasal spray Place 2 sprays into the nose daily as needed for allergies or rhinitis.     . furosemide (LASIX) 40 MG tablet Take 1 tablet (40 mg total) by mouth daily. 30 tablet 2  . hydrALAZINE (APRESOLINE) 25 MG tablet Take 1 tablet (25 mg total) by mouth 2 (two) times daily. 60 tablet 11  . lisinopril (PRINIVIL,ZESTRIL) 20 MG tablet Take 1 tablet (20 mg total) by mouth daily. 30 tablet 2  . metoprolol succinate (TOPROL-XL) 25 MG 24 hr tablet Take 1 tablet (25 mg total) by mouth daily. 90 tablet 3  . omeprazole (PRILOSEC) 20 MG capsule Take 20 mg by mouth daily as needed. For heartburn    . potassium chloride SA (K-DUR,KLOR-CON) 20 MEQ tablet Take 1 tablet (20 mEq total) by mouth daily. 30 tablet 0  . sodium chloride 1 g tablet Take 1 g by mouth once.    . tamoxifen (NOLVADEX) 20 MG tablet Take 1 tablet (20 mg total) by mouth daily. 90 tablet 0  . ALPRAZolam (XANAX) 0.5 MG tablet 1/2 tablet twice daily as needed (Patient not taking: Reported on 07/20/2017) 30 tablet 5  .  nitroGLYCERIN (NITROSTAT) 0.4 MG SL tablet Place 1 tablet (0.4 mg total) under the tongue every 5 (five) minutes as needed for chest pain. (Patient not taking: Reported on 07/20/2017) 30 tablet 0  . ondansetron (ZOFRAN) 4 MG tablet Take 1 tablet (4 mg total) by mouth every 8 (eight) hours as needed for nausea or vomiting. (Patient not taking: Reported on 07/20/2017) 20 tablet 0   No current facility-administered medications for this visit.     Review of Systems:  GENERAL: "Energy level is up".  No fevers, sweats or weight loss.  Weight up 1 pound. PERFORMANCE STATUS (  ECOG):  0 HEENT:  No visual changes, runny nose, sore throat, mouth sores or tenderness. Lungs: No shortness of breath or cough.  No hemoptysis. Cardiac:  No chest pain, palpitations, orthopnea, or PND. GI:  Eating well.  No nausea, vomiting, diarrhea, constipation, melena or hematochezia.  Colonoscopy 09/2015. GU:  No urgency, frequency, dysuria, or hematuria. Musculoskeletal:  Osteoporosis.  No back pain.  No joint pain.  No muscle tenderness. Extremities:  No pain or swelling. Skin:  Skin changes due to scleroderma.  No rashes or skin changes. Neuro:  Poor memory.  No headache, numbness or weakness, balance or coordination issues. Endocrine:  No diabetes, thyroid issues, hot flashes or night sweats. Psych:  No mood changes, depression or anxiety. Pain:  No focal pain. Review of systems:  All other systems reviewed and found to be negative.   Physical Exam:  Blood pressure (!) 165/70, pulse 66, temperature (!) 96.7 F (35.9 C), temperature source Tympanic, resp. rate 18, weight 144 lb 12.8 oz (65.7 kg). GENERAL:  Well developed, well nourished, woman sitting comfortably in the exam room in no acute distress. MENTAL STATUS:  Alert and oriented to person, place and time. HEAD:  Short brown hair with graying.  Normocephalic, atraumatic, face symmetric, no Cushingoid features. EYES:  Blue eyes.  Pupils equal round and reactive  to light and accomodation.  No conjunctivitis or scleral icterus. ENT:  Oropharynx clear without lesion.  Tongue normal. Mucous membranes moist.  RESPIRATORY:  Clear to auscultation without rales, wheezes or rhonchi. CARDIOVASCULAR:  Regular rate and rhythm without murmur, rub or gallop. BREAST:  Right breast with mass-like fullness 10  o'clock - 2 o'clock.  No skin changes or nipple discharge.  Left mastectomy.  No erythema or nodularity. ABDOMEN:  Soft, non-tender, with active bowel sounds, and no hepatosplenomegaly.  No masses. SKIN:  No rashes, ulcers or lesions. EXTREMITIES: No edema, no skin discoloration or tenderness.  No palpable cords. LYMPH NODES: No palpable cervical, supraclavicular, axillary or inguinal adenopathy  NEUROLOGICAL: Unremarkable. PSYCH:  Appropriate.    Imaging studies: 11/21/2015:  Left mammogram and ultrasound revealed a 4.2 x 2.4 x 2.6 cm irregular hypoechoic mass in the 1-2 o'clock position in the left breast 3 cm from the nipple.  Left axillary lymph nodes appeared normal.   12/25/2015:  Breast MRI revealed a 6.2 cm area of abnormal enhancement in the upper outer quadrant of the left breast.  The right breast revealed no mass or abnormal enhancement. 12/08/2016:  Right mammogram revealed no evidence of malignancy. 06/04/2017:  Chest CT revealed no pneumonia, edema, collapse or effusion.  There was a right middle lobe calcified granuloma. There was a stable 8 mm scar posterior aspect of the right upper lobe. 06/11/2017:  Abdomen and pelvic CT revealed no acute process in the abdomen or pelvis.  There was possible bladder wall thickening (? under distention).  There was nonspecific presacral and posterior pelvic edema.  There was central uterine hypoattenuation (subtle). 06/16/2017:  Head MRI without contrast revealed no acute intracranial abnormality.  There was wide spread changes c/w advanced small vessel disease.   06/22/2017:  Bone survey revealed no focal lytic  or sclerotic lesion.   Appointment on 08/12/2017  Component Date Value Ref Range Status  . Sodium 08/12/2017 127* 135 - 145 mmol/L Final  . Potassium 08/12/2017 4.1  3.5 - 5.1 mmol/L Final  . Chloride 08/12/2017 98* 101 - 111 mmol/L Final  . CO2 08/12/2017 23  22 - 32 mmol/L Final  .  Glucose, Bld 08/12/2017 106* 65 - 99 mg/dL Final  . BUN 08/12/2017 22* 6 - 20 mg/dL Final  . Creatinine, Ser 08/12/2017 1.20* 0.44 - 1.00 mg/dL Final  . Calcium 08/12/2017 9.1  8.9 - 10.3 mg/dL Final  . Total Protein 08/12/2017 7.4  6.5 - 8.1 g/dL Final  . Albumin 08/12/2017 3.7  3.5 - 5.0 g/dL Final  . AST 08/12/2017 19  15 - 41 U/L Final  . ALT 08/12/2017 11* 14 - 54 U/L Final  . Alkaline Phosphatase 08/12/2017 42  38 - 126 U/L Final  . Total Bilirubin 08/12/2017 0.3  0.3 - 1.2 mg/dL Final  . GFR calc non Af Amer 08/12/2017 44* >60 mL/min Final  . GFR calc Af Amer 08/12/2017 51* >60 mL/min Final   Comment: (NOTE) The eGFR has been calculated using the CKD EPI equation. This calculation has not been validated in all clinical situations. eGFR's persistently <60 mL/min signify possible Chronic Kidney Disease.   Georgiann Hahn gap 08/12/2017 6  5 - 15 Final   Performed at [facility], [address] [place], [place] [address]  . WBC 08/12/2017 5.5  3.6 - 11.0 K/uL Final  . RBC 08/12/2017 2.47* 3.80 - 5.20 MIL/uL Final  . Hemoglobin 08/12/2017 8.5* 12.0 - 16.0 g/dL Final  . HCT 08/12/2017 24.7* 35.0 - 47.0 % Final  . MCV 08/12/2017 100.3* 80.0 - 100.0 fL Final  . MCH 08/12/2017 34.6* 26.0 - 34.0 pg Final  . MCHC 08/12/2017 34.5  32.0 - 36.0 g/dL Final  . RDW 08/12/2017 13.6  11.5 - 14.5 % Final  . Platelets 08/12/2017 275  150 - 440 K/uL Final  . Neutrophils Relative % 08/12/2017 56  % Final  . Neutro Abs 08/12/2017 3.1  1.4 - 6.5 K/uL Final  . Lymphocytes Relative 08/12/2017 23  % Final  . Lymphs Abs 08/12/2017 1.3  1.0 - 3.6 K/uL Final  . Monocytes Relative 08/12/2017 16  % Final  . Monocytes  Absolute 08/12/2017 0.9  0.2 - 0.9 K/uL Final  . Eosinophils Relative 08/12/2017 4  % Final  . Eosinophils Absolute 08/12/2017 0.2  0 - 0.7 K/uL Final  . Basophils Relative 08/12/2017 1  % Final  . Basophils Absolute 08/12/2017 0.1  0 - 0.1 K/uL Final   Performed at [facility], [address], [place], [place] [address]    Assessment:  BABARA BUFFALO is a 72 y.o. female with stage IIA (T2N0) left breast cancer s/p mastectomy with sentinel lymph node biopsy on 12/26/2015.   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  Margins were negative.  Two sentinel lymph nodes were negative for macrometastasis, but with isolated tumor cells on IHC stains.  Three additional lymph nodes were positive for isolated tumor cells on IHC.  Tumor was ER positive (> 90%), PR positive (> 90%), and Her2/neu 2+ (eqivocal).  Her2/neu by FISH was negative.  Pathologic stage was pT2 pN0(i+).  MammaPrint testing revealed low risk luminal type A. There was a 97.8% probability of being disease free at 10 years with hormonal therapy.   Right mammogram on 12/08/2016 revealed no evidence of malignancy.  Exam in 11/2016 revealed a 4 mm nodule in the left axillae above the mastectomy incision.  Excision biopsy on 12/15/2016 revealed fat necrosis and no malignancy.  Imaging studies (chest, abdomen, and pelvic CT, head MRI, and bone survey) in 05/2017 revealed no evidence of metastatic disease.  She began tamoxifen on  02/04/2016.  She is tolerating it well.  CA27.29 has been followed: 27.3 on 12/10/2015, 19.4 on 05/30/2016, 16.4 on 09/07/2016, 17.2 on 12/08/2016, and 21.3 on 04/09/2017.  Bone density on 11/05/2015 revealed osteoporosis with a T score of -2.9 in the AP spine L1-L2 and -1.9 in the left femoral neck.  She started Fosamax in 10/2015.  She is on calcium and vitamin D.  She had a colonoscopy in 09/2015.  She denies any melena, hematochezia, hematuria or  vaginal bleeding.  She began oral B12 in early 03/2017.  B12 was 295 on 06/09/2016, 299 on 12/08/2016, and 1585 on 05/24/2017.  Folate was 11.5 on 04/09/2017.  She has a normocytic anemia.  She may have anemia of chronic renal disease.  Work-up on 12/08/2016 and 04/09/2017 revealed a low normal B12 with normal MMA thus r/o B12 deficiency.  SPEP revealed no monoclonal protein.  Free light chain ratio was 2.43 (0.26-1.65), unclear significance. Ferritin, iron saturation, and folate were normal.  TSH is normal.  Retic was 1.2% (inappropriately low) on 09/07/2016 and 1.1% on 06/23/2017.  Coombs was negative on 10/19/2016 and 06/24/2017.  Creatinine was 1.41 (CrCl 36 ml/min) on 06/29/2017.    24 hour urine (IFE) on 12/28/2016 revealed no monoclonal protein. 24 hour urine on 06/23/2017 revealed kappa free light chains 157 (1.35-24.19), lambda free light chains 6.99 (0.24-6.66), and free light chain ratio 22.46 (2.04-10.37).  Kappa free light chains were 159.9 (ratio 2.79) on 06/23/2017.  SPEP was normal on 06/23/2017.  Bone marrow aspirate and biopsy on 07/07/2017 revealed a variably cellular bone marrow with trilineage hematopoiesis.  There was slight plasmacytosis (4%).  Plasma cells were polyclonal for kappa and lambda light chains.  There were several lymphoid aggregates.  Congo stain for amyloid was negative.  Flow cytometry revealed no monoclonal B-cell population or abnormal T-cell population.  Cytogenetics were normal (22, XX).  She has a history of chronic hyponatremia (dating back to 07/2012).  She is followed by nephrology.  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  She is on fluid restriction. Cortisol and uric acid were normal on 06/24/2017.   She has scleroderma.  She has skin thickening at sites of abrasion.  She has hypercalcemia of unclear etiology.  PTH was 14 (low) and not c/w primary hyperparathyroidism.  TSH was normal.  PTH-rp on 06/23/2017 was < 2.0.  ACE level was 47 (normal) on  06/23/2017.  Calcitriol (vitamin D 1, 25 dihydroxy) was 75.3 (19.9-79.3).  Vitamin D, 25 hydroxy was 68.6 (30-100) on 06/22/2017.  She denies excess vitamin A and vitamin D intake.    Symptomatically, she is feeling better. She has gained weight.  Exam reveals mass-like fullness between 10 o'clock and 2 o'clock in the right breast.  Hematocrit is 24.7 with a hemoglobin of 8.5. Sodium down to 127 despite daily oral sodium tablet.   Plan: 1.  Labs today: CBC with diff, CMP, CA27.29. 2.  Discuss anemia of chronic disease.  Hemoglobin 8.5.  Discuss Procrit injection every 2 weeks.  Approved by nephrology.  Will start today.  3.  Discuss HYPOnatremia. Sodium 127 despite oral sodium tablets daily. Discussed with Dr. Holley Raring.  4.  Schedule RIGHT breast ultrasound and diagnostic mammogram. 5.  Continue tamoxifen as previously prescribed.  6.  Discuss HYPERcalcemia. Patient has not been seen by endocrinology. Spoke with Dr. Gabriel Carina who advises that patient will be seen within the next month.  7.  Schedule bone density on 11/04/2017. 8.  RTC 1 months for MD assessment, labs (  CBC with diff, CMP), and +/- Procrit.    Honor Loh, NP  08/12/2017, 2:32 PM  I saw and evaluated the patient, participating in the key portions of the service and reviewing pertinent diagnostic studies and records.  I reviewed the nurse practitioner's note and agree with the findings and the plan.  Multiple questions were asked by the patient and answered.   Nolon Stalls, MD 08/12/2017,2:32 PM

## 2017-08-12 NOTE — Progress Notes (Unsigned)
Doran Durand, NP gives verbal approval to proceed with Procrit injection with bp 165/70.

## 2017-08-13 LAB — CA 27.29 (SERIAL MONITOR): CA 27.29: 17.6 U/mL (ref 0.0–38.6)

## 2017-08-13 NOTE — Telephone Encounter (Signed)
Erroneous entry,

## 2017-08-17 DIAGNOSIS — Z8639 Personal history of other endocrine, nutritional and metabolic disease: Secondary | ICD-10-CM | POA: Diagnosis not present

## 2017-08-17 DIAGNOSIS — E222 Syndrome of inappropriate secretion of antidiuretic hormone: Secondary | ICD-10-CM | POA: Diagnosis not present

## 2017-08-17 DIAGNOSIS — E871 Hypo-osmolality and hyponatremia: Secondary | ICD-10-CM | POA: Diagnosis not present

## 2017-08-17 DIAGNOSIS — N183 Chronic kidney disease, stage 3 (moderate): Secondary | ICD-10-CM | POA: Diagnosis not present

## 2017-08-23 ENCOUNTER — Ambulatory Visit
Admission: RE | Admit: 2017-08-23 | Discharge: 2017-08-23 | Disposition: A | Discharge status: Home / Self Care | Payer: PPO | Source: Ambulatory Visit | Attending: Urgent Care | Admitting: Urgent Care

## 2017-08-23 DIAGNOSIS — C50412 Malignant neoplasm of upper-outer quadrant of left female breast: Secondary | ICD-10-CM

## 2017-08-23 DIAGNOSIS — R922 Inconclusive mammogram: Secondary | ICD-10-CM | POA: Diagnosis not present

## 2017-08-23 DIAGNOSIS — N6489 Other specified disorders of breast: Secondary | ICD-10-CM | POA: Diagnosis not present

## 2017-08-24 DIAGNOSIS — I10 Essential (primary) hypertension: Secondary | ICD-10-CM | POA: Diagnosis not present

## 2017-08-24 DIAGNOSIS — E871 Hypo-osmolality and hyponatremia: Secondary | ICD-10-CM | POA: Diagnosis not present

## 2017-08-24 DIAGNOSIS — R809 Proteinuria, unspecified: Secondary | ICD-10-CM | POA: Diagnosis not present

## 2017-08-24 DIAGNOSIS — N183 Chronic kidney disease, stage 3 (moderate): Secondary | ICD-10-CM | POA: Diagnosis not present

## 2017-08-26 ENCOUNTER — Inpatient Hospital Stay: Payer: PPO | Attending: Hematology and Oncology

## 2017-08-26 ENCOUNTER — Inpatient Hospital Stay: Payer: PPO

## 2017-08-26 DIAGNOSIS — M810 Age-related osteoporosis without current pathological fracture: Secondary | ICD-10-CM | POA: Diagnosis not present

## 2017-08-26 DIAGNOSIS — E871 Hypo-osmolality and hyponatremia: Secondary | ICD-10-CM | POA: Insufficient documentation

## 2017-08-26 DIAGNOSIS — D649 Anemia, unspecified: Secondary | ICD-10-CM | POA: Diagnosis not present

## 2017-08-26 DIAGNOSIS — Z87891 Personal history of nicotine dependence: Secondary | ICD-10-CM | POA: Insufficient documentation

## 2017-08-26 DIAGNOSIS — J449 Chronic obstructive pulmonary disease, unspecified: Secondary | ICD-10-CM | POA: Diagnosis not present

## 2017-08-26 DIAGNOSIS — C50412 Malignant neoplasm of upper-outer quadrant of left female breast: Secondary | ICD-10-CM | POA: Insufficient documentation

## 2017-08-26 LAB — CBC WITH DIFFERENTIAL/PLATELET
Basophils Absolute: 0.1 10*3/uL (ref 0–0.1)
Basophils Relative: 2 %
Eosinophils Absolute: 0.2 10*3/uL (ref 0–0.7)
Eosinophils Relative: 4 %
HCT: 26.9 % — ABNORMAL LOW (ref 35.0–47.0)
Hemoglobin: 9.3 g/dL — ABNORMAL LOW (ref 12.0–16.0)
Lymphocytes Relative: 19 %
Lymphs Abs: 0.9 10*3/uL — ABNORMAL LOW (ref 1.0–3.6)
MCH: 34.9 pg — ABNORMAL HIGH (ref 26.0–34.0)
MCHC: 34.8 g/dL (ref 32.0–36.0)
MCV: 100.3 fL — ABNORMAL HIGH (ref 80.0–100.0)
Monocytes Absolute: 0.9 10*3/uL (ref 0.2–0.9)
Monocytes Relative: 18 %
Neutro Abs: 2.8 10*3/uL (ref 1.4–6.5)
Neutrophils Relative %: 57 %
Platelets: 253 10*3/uL (ref 150–440)
RBC: 2.68 MIL/uL — ABNORMAL LOW (ref 3.80–5.20)
RDW: 13.2 % (ref 11.5–14.5)
WBC: 4.9 10*3/uL (ref 3.6–11.0)

## 2017-08-27 ENCOUNTER — Inpatient Hospital Stay: Payer: PPO

## 2017-08-27 VITALS — BP 150/64

## 2017-08-27 DIAGNOSIS — N189 Chronic kidney disease, unspecified: Secondary | ICD-10-CM

## 2017-08-27 DIAGNOSIS — D631 Anemia in chronic kidney disease: Principal | ICD-10-CM

## 2017-08-27 DIAGNOSIS — C50412 Malignant neoplasm of upper-outer quadrant of left female breast: Secondary | ICD-10-CM | POA: Diagnosis not present

## 2017-08-27 MED ORDER — EPOETIN ALFA 20000 UNIT/ML IJ SOLN
10000.0000 [IU] | Freq: Once | INTRAMUSCULAR | Status: AC
  Administered 2017-08-27: 10000 [IU] via SUBCUTANEOUS

## 2017-09-01 IMAGING — MG MM BREAST LOCALIZATION CLIP
2 series · 2 of 2 positions shown · non-contrast
Comparison: Previous exam(s).

CLINICAL DATA: Post left breast ultrasound-guided core biopsy.

EXAM:
DIAGNOSTIC LEFT MAMMOGRAM POST ULTRASOUND BIOPSY

[L CC]
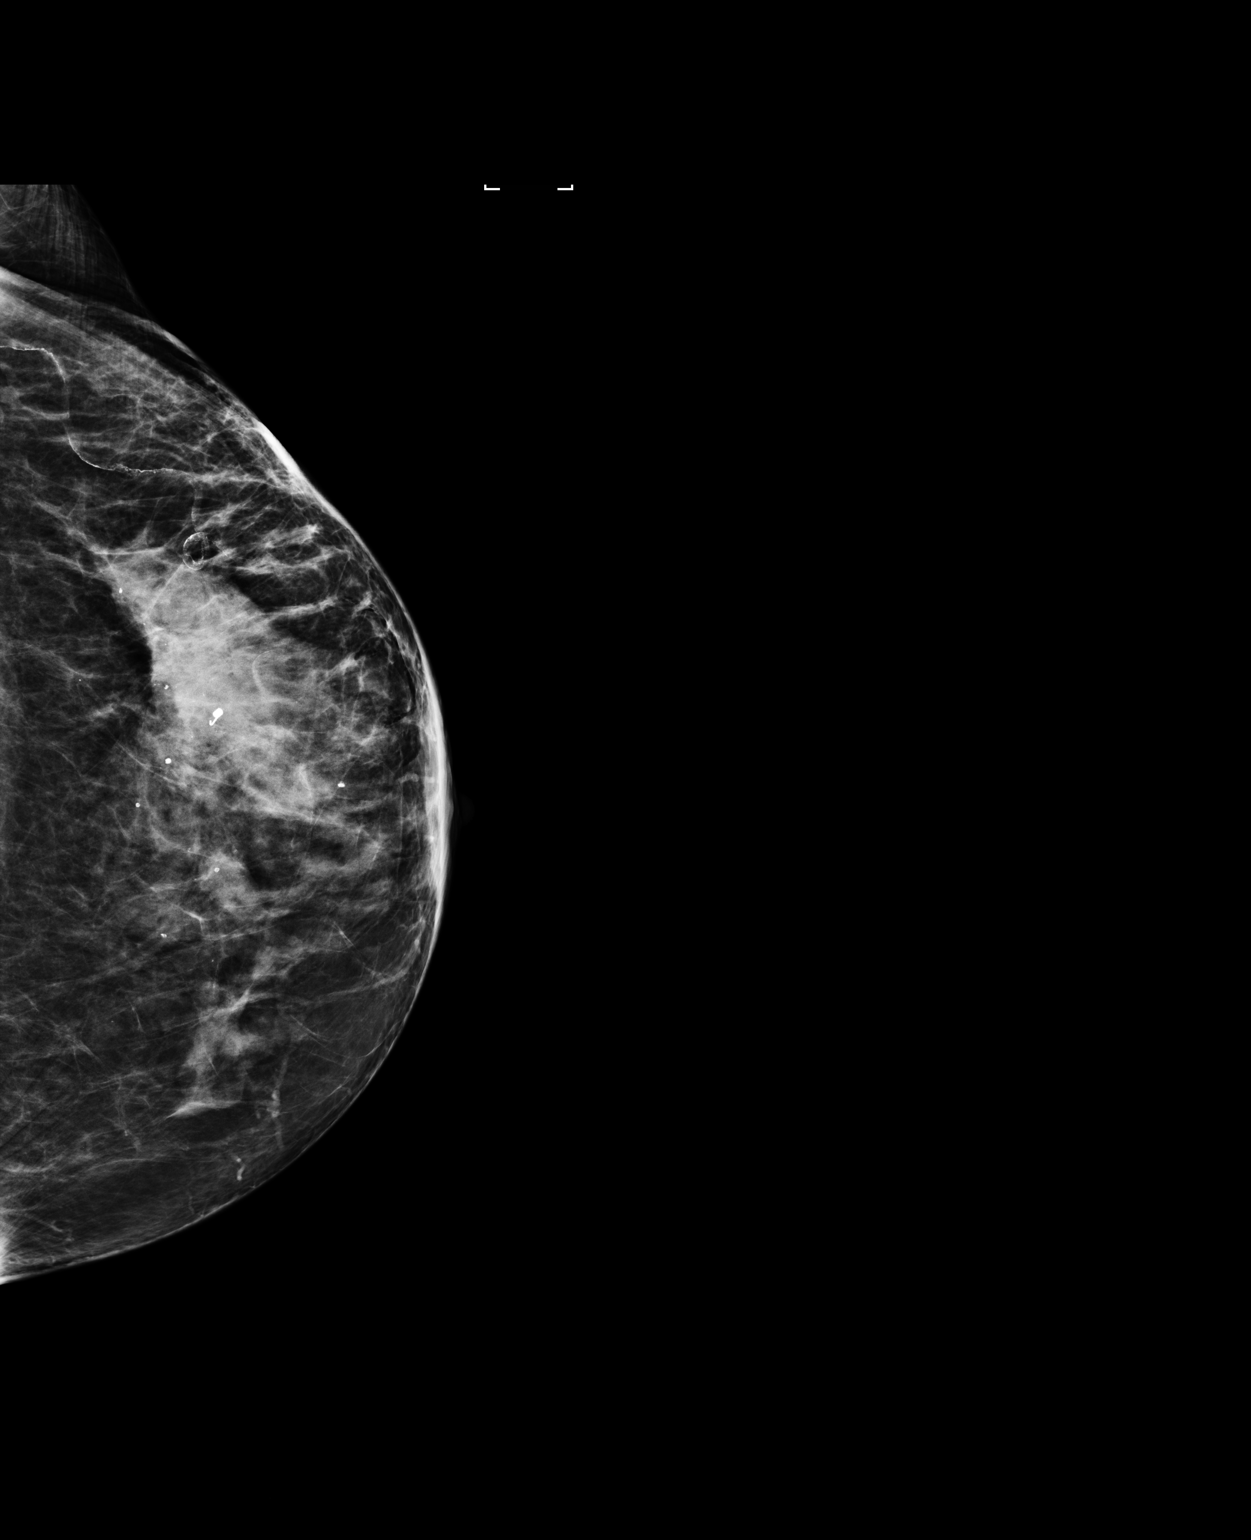

[L ML]
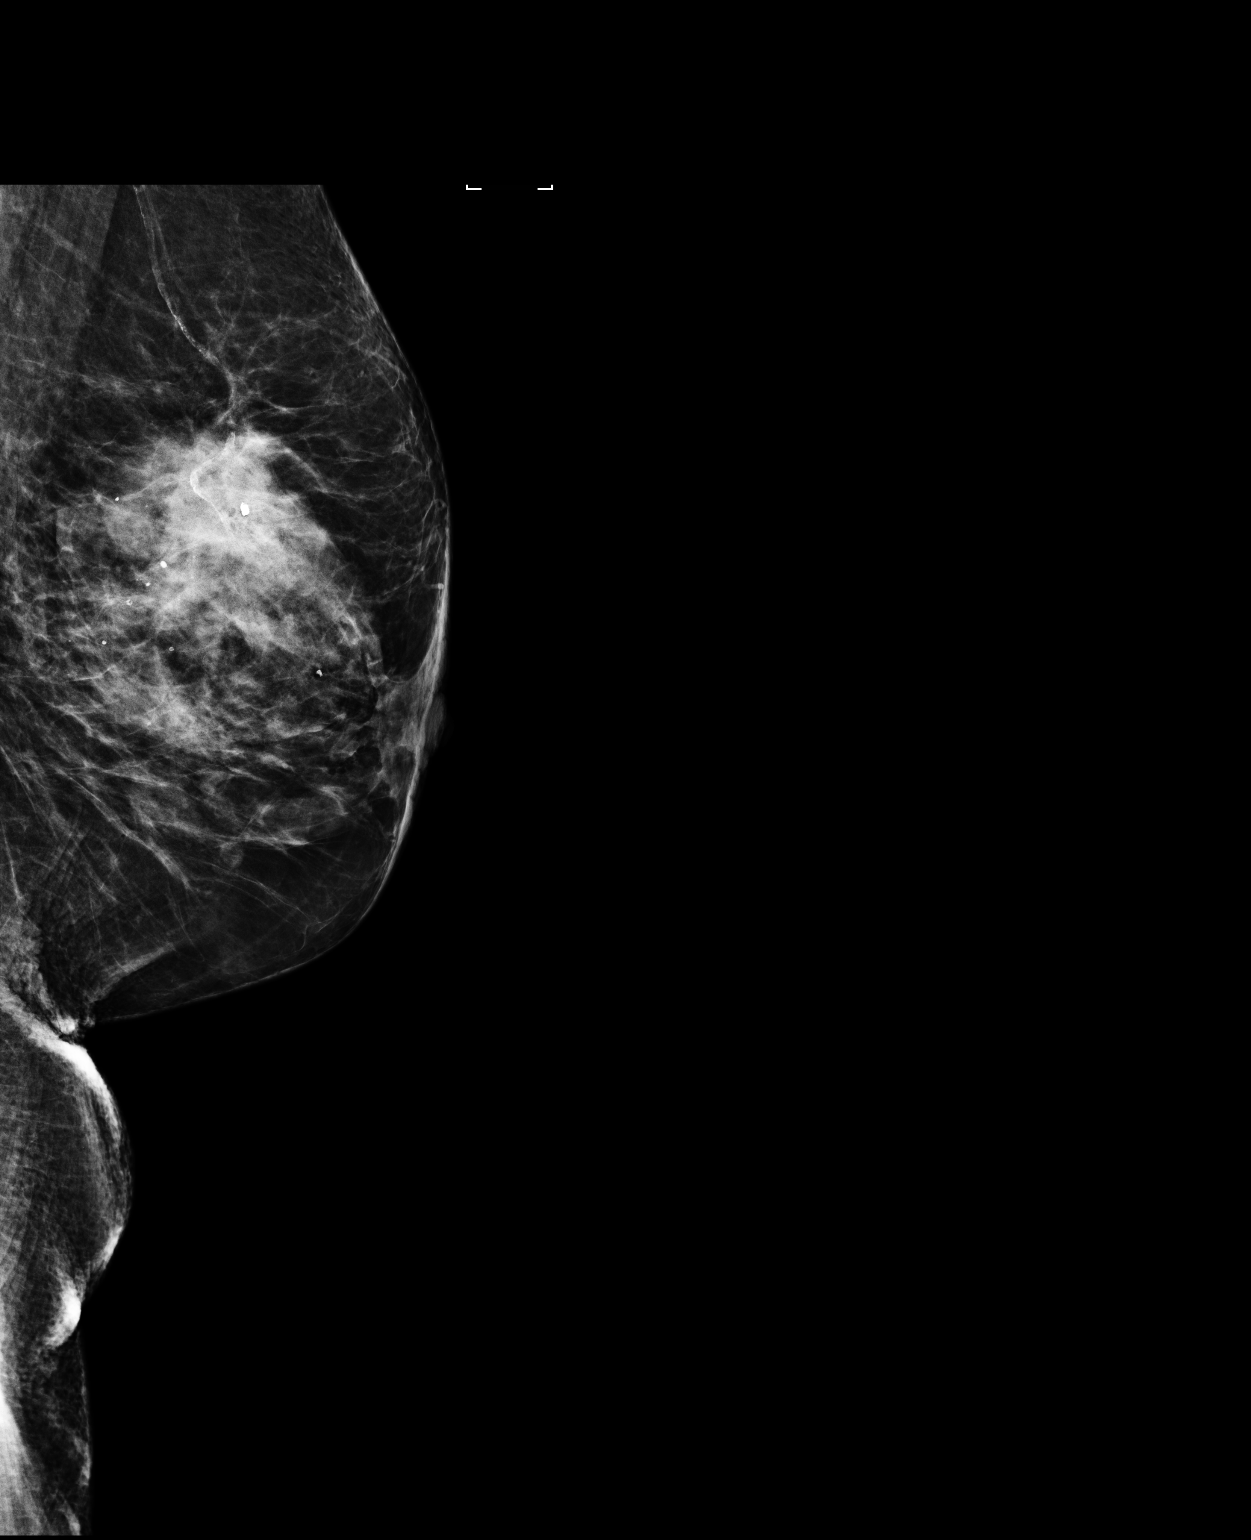

[2 of 2 positions shown; findings below may reference images not displayed]

FINDINGS: Mammographic images were obtained following ultrasound guided biopsy
of the mass located within the left breast at the 1 o'clock
position. The coil shaped clip is in appropriate position.
IMPRESSION: Appropriate positioning of clip following left breast ultrasound
guided core biopsy.

Final Assessment: Post Procedure Mammograms for Marker Placement

## 2017-09-09 ENCOUNTER — Inpatient Hospital Stay: Payer: PPO

## 2017-09-09 ENCOUNTER — Other Ambulatory Visit: Payer: Self-pay

## 2017-09-09 DIAGNOSIS — K219 Gastro-esophageal reflux disease without esophagitis: Secondary | ICD-10-CM | POA: Diagnosis not present

## 2017-09-09 DIAGNOSIS — C50412 Malignant neoplasm of upper-outer quadrant of left female breast: Secondary | ICD-10-CM

## 2017-09-09 DIAGNOSIS — F419 Anxiety disorder, unspecified: Secondary | ICD-10-CM | POA: Diagnosis not present

## 2017-09-09 DIAGNOSIS — E785 Hyperlipidemia, unspecified: Secondary | ICD-10-CM | POA: Diagnosis not present

## 2017-09-09 DIAGNOSIS — D631 Anemia in chronic kidney disease: Secondary | ICD-10-CM | POA: Diagnosis not present

## 2017-09-09 DIAGNOSIS — N183 Chronic kidney disease, stage 3 (moderate): Secondary | ICD-10-CM | POA: Diagnosis not present

## 2017-09-09 DIAGNOSIS — I10 Essential (primary) hypertension: Secondary | ICD-10-CM | POA: Diagnosis not present

## 2017-09-09 DIAGNOSIS — J449 Chronic obstructive pulmonary disease, unspecified: Secondary | ICD-10-CM | POA: Diagnosis not present

## 2017-09-09 DIAGNOSIS — E871 Hypo-osmolality and hyponatremia: Secondary | ICD-10-CM | POA: Diagnosis not present

## 2017-09-09 LAB — CBC WITH DIFFERENTIAL/PLATELET
Basophils Absolute: 0.1 10*3/uL (ref 0–0.1)
Basophils Relative: 2 %
Eosinophils Absolute: 0.2 10*3/uL (ref 0–0.7)
Eosinophils Relative: 5 %
HCT: 31.1 % — ABNORMAL LOW (ref 35.0–47.0)
Hemoglobin: 10.7 g/dL — ABNORMAL LOW (ref 12.0–16.0)
Lymphocytes Relative: 18 %
Lymphs Abs: 0.8 10*3/uL — ABNORMAL LOW (ref 1.0–3.6)
MCH: 34.3 pg — ABNORMAL HIGH (ref 26.0–34.0)
MCHC: 34.6 g/dL (ref 32.0–36.0)
MCV: 99.3 fL (ref 80.0–100.0)
Monocytes Absolute: 0.9 10*3/uL (ref 0.2–0.9)
Monocytes Relative: 19 %
Neutro Abs: 2.7 10*3/uL (ref 1.4–6.5)
Neutrophils Relative %: 56 %
Platelets: 296 10*3/uL (ref 150–440)
RBC: 3.13 MIL/uL — ABNORMAL LOW (ref 3.80–5.20)
RDW: 12.8 % (ref 11.5–14.5)
WBC: 4.7 10*3/uL (ref 3.6–11.0)

## 2017-09-09 LAB — COMPREHENSIVE METABOLIC PANEL
ALT: 12 U/L — ABNORMAL LOW (ref 14–54)
AST: 23 U/L (ref 15–41)
Albumin: 4 g/dL (ref 3.5–5.0)
Alkaline Phosphatase: 46 U/L (ref 38–126)
Anion gap: 10 (ref 5–15)
BUN: 21 mg/dL — ABNORMAL HIGH (ref 6–20)
CO2: 23 mmol/L (ref 22–32)
Calcium: 9.3 mg/dL (ref 8.9–10.3)
Chloride: 100 mmol/L — ABNORMAL LOW (ref 101–111)
Creatinine, Ser: 1.23 mg/dL — ABNORMAL HIGH (ref 0.44–1.00)
GFR calc Af Amer: 50 mL/min — ABNORMAL LOW (ref >60)
GFR calc non Af Amer: 43 mL/min — ABNORMAL LOW (ref >60)
Glucose, Bld: 97 mg/dL (ref 65–99)
Potassium: 4.3 mmol/L (ref 3.5–5.1)
Sodium: 133 mmol/L — ABNORMAL LOW (ref 135–145)
Total Bilirubin: 0.4 mg/dL (ref 0.3–1.2)
Total Protein: 8 g/dL (ref 6.5–8.1)

## 2017-09-16 ENCOUNTER — Telehealth: Payer: Self-pay

## 2017-09-16 ENCOUNTER — Other Ambulatory Visit: Payer: Self-pay | Admitting: Cardiovascular Disease

## 2017-09-16 NOTE — Telephone Encounter (Signed)
Spoke with pt who states that she is no longer a pt here due to insurance. Will remove from call list. -MM

## 2017-09-17 ENCOUNTER — Telehealth: Payer: Self-pay

## 2017-09-17 NOTE — Telephone Encounter (Signed)
Unable to leave message, vm full °

## 2017-09-17 NOTE — Telephone Encounter (Signed)
-----   Message from Anselm Pancoast, [place] sent at 09/16/2017 10:59 AM EDT ----- Please contact patient for a follow up with Dr. Rockey Situ.  The patient was to follow up in Jan. 2019.  Thanks, Ivin Booty

## 2017-09-20 NOTE — Progress Notes (Signed)
[facility] day:  09/21/2017    Chief Complaint: Bianca Shaw is a 72 y.o. female with stage IIA left breast cancer and electrolytes abnormalities (hyponatremia and hypercalcemia) who is seen for a 6-week assessment on tamoxifen.   HPI:  The patient was last seen in the medical oncology clinic on 08/12/2017.  At that time, patient was doing well overall.  Her energy level had improved.  She described no breast concerns.  She continued on tamoxifen as prescribed. Exam revealed mass-like fullness between 10 o'clock and 2 o'clock in the right breast.  Hematocrit was 24.7 with a hemoglobin of 8.5. Sodium was down to 127 despite daily oral sodium tablet.  She was scheduled for diagnostic mammogram and ultrasound.  She received Procrit injection.  Diagnostic mammogram and breast ultrasound done on 08/23/2017 revealed no mammographic or sonographic evidence of malignancy in the RIGHT breast. Patient has heterogeneously dense breast tissue (ACR  breast density category C).   CBC on 08/26/2017 revealed a WBC of 4900 ([place] 2800). Hemoglobin 9.3, hematocrit 26.6, MCV 100.3, and platelets 253,000. She received Procrit 10,000 units.   CBC on 09/09/2017 revealed a WBC of 4700 ([place] 2700). Hemoglobin 10.7, hematocrit 31.1, MCV 99.3, and platelets 296,000. Procrit was held for hemoglobin > 10.   Patient was seen in consult on 08/17/2017 by Dr. Lavone Orn (endocrinology) for evaluation of noted HYPERcalcemia and HYPOnatremia.  Endocrinology felt that, given the patient's low PTH and elevated urinary calcium, coupled with recent negative bone imaging, the etiology of patient's hypercalcemia was related to thiazide diuretic and exogenous calcium use.  Plan was to resume tamoxifen and follow serum calcium levels.  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  Patient was reportedly "drinking a lot of water".  Free water restriction was ordered.  Patient was to limit  her water intake to 2 glasses a day, along with a single cup of coffee. Other oral hydration was to include electrolytes.  Patient to follow-up in 3 months.  Patient is being followed by nephrology (Dr Holley Raring). Patient notes that everything was stable. She is scheduled to follow up with nephrology every 4 months.   In the interim, patient is doing well today. There are no acute concerns. Patient denies B symptoms and interval infections. Patient is eating well. Weight has decreased by 1 pound. Patient denies pain in the clinic today.    Past Medical History:  Diagnosis Date  . Anemia   . Anxiety disorder   . Breast cancer of upper-outer quadrant of left female breast ([place]) 11/2015   pT2 pN0(i+).;ER+; PR +, her 2 neu not overexpressed.  Mastectomy, SLN, Mammoprint: Low risk.   . Cancer ([place]) 12/03/2015   left breast/ INVASIVE LOBULAR CARCINOMA.   . Cough    lingering, mild, finished Prednisone and anitbiotic 11/03/15  . Family history of adverse reaction to anesthesia    sister - PONV  . GERD (gastroesophageal reflux disease)   . H/O: hysterectomy   . Hypertension   . Osteopenia   . Osteoporosis   . Pericarditis    diagnonsed June, 2010, unclear etiology as of yer  . Personal history of tobacco use, presenting hazards to health 10/31/2015  . Scleroderma ([facility])    ONLY ON SKIN-MILD  . UTI (lower urinary tract infection)   . Wears dentures    full upper    Past Surgical History:  Procedure Laterality Date  . BREAST BIOPSY Right 2012   core - neg  .  BREAST BIOPSY Left 12/03/2015   INVASIVE LOBULAR CARCINOMA.   Marland Kitchen CATARACT EXTRACTION W/ INTRAOCULAR LENS IMPLANT Right   . COLONOSCOPY WITH PROPOFOL N/A 11/08/2015   Procedure: COLONOSCOPY WITH PROPOFOL;  Surgeon: Lucilla Lame, MD;  Location: [facility];  Service: Endoscopy;  Laterality: N/A;  . EVACUATION BREAST HEMATOMA Left 01/14/2016   Procedure: EVACUATION HEMATOMA BREAST;  Surgeon: Robert Bellow, MD;  Location: [facility];   Service: General;  Laterality: Left;  Marland Kitchen MASTECTOMY Left 2017   complete mastectomy  . MASTECTOMY W/ SENTINEL NODE BIOPSY Left 12/26/2015   Procedure: MASTECTOMY WITH SENTINEL LYMPH NODE BIOPSY;  Surgeon: Robert Bellow, MD;  Location: [facility];  Service: General;  Laterality: Left;  . RIGHT/LEFT HEART CATH AND CORONARY ANGIOGRAPHY N/A 07/22/2016   Procedure: Right/Left Heart Cath and Coronary Angiography;  Surgeon: Minna Merritts, MD;  Location: [facility];  Service: Cardiovascular;  Laterality: N/A;  . TUBAL LIGATION    . VESICOVAGINAL FISTULA CLOSURE W/ TAH      Family History  Problem Relation Age of Onset  . Heart failure Mother   . Epilepsy Mother   . COPD Father   . Heart disease Father   . Anxiety disorder Sister   . Arthritis Brother   . Heart disease Brother   . Vaginal cancer Paternal Grandmother   . Heart attack Paternal Grandfather   . COPD Brother   . Kidney failure Brother   . COPD Brother   . Arthritis Sister   . Uterine cancer Unknown   . Diabetes Unknown   . Colon cancer Neg Hx   . Stomach cancer Neg Hx   . Breast cancer Neg Hx     Social History:  reports that she quit smoking about 13 years ago. Her smoking use included cigarettes. She has a 30.00 pack-year smoking history. She has never used smokeless tobacco. She reports that she drinks about 0.6 - 1.8 oz of alcohol per week. She reports that she does not use drugs.  She has 5 brothers and 2 sisters.  She has 2 children who are alive and well.  She lives in [place] with her husband, Pat Patrick.  The patient is accompanied by her husband today.  Allergies:  Allergies  Allergen Reactions  . Pimenta Nausea And Vomiting  . Tomato     Current Medications: Current Outpatient Medications  Medication Sig Dispense Refill  . albuterol (PROVENTIL) (2.5 MG/3ML) 0.083% nebulizer solution Take 3 mLs (2.5 mg total) by nebulization every 6 (six) hours as needed for wheezing or shortness of breath. 75 mL 12  .  ALPRAZolam (XANAX) 0.5 MG tablet 1/2 tablet twice daily as needed 30 tablet 5  . aspirin EC 81 MG tablet Take 81 mg by mouth daily.    . busPIRone (BUSPAR) 5 MG tablet 1 Tablet, Oral QHS (Patient taking differently: Take 5 mg by mouth at bedtime. ) 30 tablet 12  . fluticasone (FLONASE) 50 MCG/ACT nasal spray Place 2 sprays into the nose daily as needed for allergies or rhinitis.     . furosemide (LASIX) 40 MG tablet Take 1 tablet (40 mg total) by mouth daily. 30 tablet 2  . hydrALAZINE (APRESOLINE) 25 MG tablet Take 1 tablet (25 mg total) by mouth 2 (two) times daily. 60 tablet 11  . lisinopril (PRINIVIL,ZESTRIL) 20 MG tablet Take 1 tablet (20 mg total) by mouth daily. 30 tablet 2  . metoprolol succinate (TOPROL-XL) 25 MG 24 hr tablet Take 1 tablet (25 mg  total) by mouth daily. 90 tablet 3  . nitroGLYCERIN (NITROSTAT) 0.4 MG SL tablet Place 1 tablet (0.4 mg total) under the tongue every 5 (five) minutes as needed for chest pain. 30 tablet 0  . omeprazole (PRILOSEC) 20 MG capsule Take 20 mg by mouth daily as needed. For heartburn    . ondansetron (ZOFRAN) 4 MG tablet Take 1 tablet (4 mg total) by mouth every 8 (eight) hours as needed for nausea or vomiting. 20 tablet 0  . potassium chloride SA (K-DUR,KLOR-CON) 20 MEQ tablet Take 1 tablet (20 mEq total) by mouth daily. 30 tablet 0  . sodium chloride 1 g tablet Take 1 g by mouth 2 (two) times daily with a meal.     . tamoxifen (NOLVADEX) 20 MG tablet Take 1 tablet (20 mg total) by mouth daily. 90 tablet 0   No current facility-administered medications for this visit.     Review of Systems  Constitutional: Positive for weight loss (donw 1 pound). Negative for diaphoresis, fever and malaise/fatigue.       "I feel fine"  HENT: Negative.  Negative for congestion, nosebleeds and sore throat.   Eyes: Negative.  Negative for blurred vision, double vision, pain, discharge and redness.  Respiratory: Negative for cough, hemoptysis, sputum production and  shortness of breath.   Cardiovascular: Negative for chest pain, palpitations, orthopnea, leg swelling and PND.  Gastrointestinal: Negative for abdominal pain, blood in stool, constipation, diarrhea, melena, nausea and vomiting.       Last colonoscopy was in 09/2015.  Genitourinary: Negative for dysuria, frequency, hematuria and urgency.  Musculoskeletal: Negative for back pain, falls, joint pain and myalgias.       Osteoporosis  Skin: Negative for itching and rash.       Scleroderma  Neurological: Negative for dizziness, tremors, weakness and headaches.  Endo/Heme/Allergies: Does not bruise/bleed easily.  Psychiatric/Behavioral: Positive for memory loss. Negative for depression and suicidal ideas. The patient is not nervous/anxious and does not have insomnia.   All other systems reviewed and are negative.  Performance status (ECOG): 0 - Asymptomatic   Physical Exam:  Blood pressure (!) 179/77, pulse 96, resp. rate 18, weight 143 lb 12.8 oz (65.2 kg). GENERAL:  Well developed, well nourished, woman sitting comfortably in the exam room in no acute distress. MENTAL STATUS:  Alert and oriented to person, place and time. HEAD:  Short gray hair.  Normocephalic, atraumatic, face symmetric, no Cushingoid features. EYES:  Blue eyes.  Pupils equal round and reactive to light and accomodation.  No conjunctivitis or scleral icterus. ENT:  Oropharynx clear without lesion.  Tongue normal. Mucous membranes moist.  RESPIRATORY:  Clear to auscultation without rales, wheezes or rhonchi. CARDIOVASCULAR:  Regular rate and rhythm without murmur, rub or gallop. ABDOMEN:  Soft, non-tender, with active bowel sounds, and no hepatosplenomegaly.  No masses. SKIN:  No rashes, ulcers or lesions. EXTREMITIES: No edema, no skin discoloration or tenderness.  No palpable cords. LYMPH NODES: No palpable cervical, supraclavicular, axillary or inguinal adenopathy  NEUROLOGICAL: Unremarkable. PSYCH:  Appropriate.     Imaging studies: 11/21/2015:  Left mammogram and ultrasound revealed a 4.2 x 2.4 x 2.6 cm irregular hypoechoic mass in the 1-2 o'clock position in the left breast 3 cm from the nipple.  Left axillary lymph nodes appeared normal.   12/25/2015:  Breast MRI revealed a 6.2 cm area of abnormal enhancement in the upper outer quadrant of the left breast.  The right breast revealed no mass or abnormal enhancement. 12/08/2016:  Right mammogram revealed no evidence of malignancy. 06/04/2017:  Chest CT revealed no pneumonia, edema, collapse or effusion.  There was a right middle lobe calcified granuloma. There was a stable 8 mm scar posterior aspect of the right upper lobe. 06/11/2017:  Abdomen and pelvic CT revealed no acute process in the abdomen or pelvis.  There was possible bladder wall thickening (? under distention).  There was nonspecific presacral and posterior pelvic edema.  There was central uterine hypoattenuation (subtle). 06/16/2017:  Head MRI without contrast revealed no acute intracranial abnormality.  There was wide spread changes c/w advanced small vessel disease.   06/22/2017:  Bone survey revealed no focal lytic or sclerotic lesion. 08/23/2017: RIGHT diagnostic mammogram and breast ultrasound on 08/23/2017 revealed no mammographic or sonographic evidence of malignancy in the RIGHT breast. Patient has heterogeneously dense breast tissue (ACR  breast density category C).    Appointment on 09/21/2017  Component Date Value Ref Range Status  . Sodium 09/21/2017 131* 135 - 145 mmol/L Final  . Potassium 09/21/2017 4.2  3.5 - 5.1 mmol/L Final  . Chloride 09/21/2017 97* 101 - 111 mmol/L Final  . CO2 09/21/2017 24  22 - 32 mmol/L Final  . Glucose, Bld 09/21/2017 105* 65 - 99 mg/dL Final  . BUN 09/21/2017 23* 6 - 20 mg/dL Final  . Creatinine, Ser 09/21/2017 1.16* 0.44 - 1.00 mg/dL Final  . Calcium 09/21/2017 9.3  8.9 - 10.3 mg/dL Final  . Total Protein 09/21/2017 8.0  6.5 - 8.1 g/dL Final   . Albumin 09/21/2017 3.9  3.5 - 5.0 g/dL Final  . AST 09/21/2017 20  15 - 41 U/L Final  . ALT 09/21/2017 14  14 - 54 U/L Final  . Alkaline Phosphatase 09/21/2017 50  38 - 126 U/L Final  . Total Bilirubin 09/21/2017 0.2* 0.3 - 1.2 mg/dL Final  . GFR calc non Af Amer 09/21/2017 46* >60 mL/min Final  . GFR calc Af Amer 09/21/2017 53* >60 mL/min Final   Comment: (NOTE) The eGFR has been calculated using the CKD EPI equation. This calculation has not been validated in all clinical situations. eGFR's persistently <60 mL/min signify possible Chronic Kidney Disease.   Georgiann Hahn gap 09/21/2017 10  5 - 15 Final   Performed at [facility], [address] [place], [place] [address]  . WBC 09/21/2017 4.7  3.6 - 11.0 K/uL Final  . RBC 09/21/2017 3.17* 3.80 - 5.20 MIL/uL Final  . Hemoglobin 09/21/2017 10.6* 12.0 - 16.0 g/dL Final  . HCT 09/21/2017 31.1* 35.0 - 47.0 % Final  . MCV 09/21/2017 98.1  80.0 - 100.0 fL Final  . MCH 09/21/2017 33.5  26.0 - 34.0 pg Final  . MCHC 09/21/2017 34.1  32.0 - 36.0 g/dL Final  . RDW 09/21/2017 12.7  11.5 - 14.5 % Final  . Platelets 09/21/2017 262  150 - 440 K/uL Final  . Neutrophils Relative % 09/21/2017 55  % Final  . Neutro Abs 09/21/2017 2.6  1.4 - 6.5 K/uL Final  . Lymphocytes Relative 09/21/2017 22  % Final  . Lymphs Abs 09/21/2017 1.0  1.0 - 3.6 K/uL Final  . Monocytes Relative 09/21/2017 16  % Final  . Monocytes Absolute 09/21/2017 0.8  0.2 - 0.9 K/uL Final  . Eosinophils Relative 09/21/2017 5  % Final  . Eosinophils Absolute 09/21/2017 0.2  0 - 0.7 K/uL Final  . Basophils Relative 09/21/2017 2  % Final  . Basophils Absolute 09/21/2017 0.1  0 - 0.1 K/uL  Final   Performed at [facility], [address], [place], [place] [address]    Assessment:  Bianca Shaw is a 72 y.o. female with stage IIA (T2N0) left breast cancer s/p mastectomy with sentinel lymph node biopsy on 12/26/2015.   Pathology revealed a 4.2 cm grade I invasive lobular  carcinoma with scattered microcalcifications.  Margins were negative.  Two sentinel lymph nodes were negative for macrometastasis, but with isolated tumor cells on IHC stains.  Three additional lymph nodes were positive for isolated tumor cells on IHC.  Tumor was ER positive (> 90%), PR positive (> 90%), and Her2/neu 2+ (eqivocal).  Her2/neu by FISH was negative.  Pathologic stage was pT2 pN0(i+).  MammaPrint testing revealed low risk luminal type A. There was a 97.8% probability of being disease free at 10 years with hormonal therapy.   Right mammogram on 12/08/2016 revealed no evidence of malignancy. RIGHT diagnostic mammogram and breast ultrasound on 08/23/2017 revealed no mammographic or sonographic evidence of malignancy in the RIGHT breast. Patient has heterogeneously dense breast tissue (ACR  breast density category C).   Exam in 11/2016 revealed a 4 mm nodule in the left axillae above the mastectomy incision.  Excision biopsy on 12/15/2016 revealed fat necrosis and no malignancy.  Imaging studies (chest, abdomen, and pelvic CT, head MRI, and bone survey) in 05/2017 revealed no evidence of metastatic disease.  She began tamoxifen on 02/04/2016.  She is tolerating it well.  CA27.29 has been followed: 27.3 on 12/10/2015, 19.4 on 05/30/2016, 16.4 on 09/07/2016, 17.2 on 12/08/2016, 21.3 on 04/09/2017, and 17.6 on 08/12/2017.  Bone density on 11/05/2015 revealed osteoporosis with a T score of -2.9 in the AP spine L1-L2 and -1.9 in the left femoral neck.  She started Fosamax in 10/2015.  She is on calcium and vitamin D.  She had a colonoscopy in 09/2015.  She denies any melena, hematochezia, hematuria or vaginal bleeding.  She began oral B12 in early 03/2017.  B12 was 295 on 06/09/2016, 299 on 12/08/2016, and 1585 on 05/24/2017.  Folate was 11.5 on 04/09/2017.  She has a normocytic anemia.  She may have anemia of chronic renal disease.  Work-up on 12/08/2016 and 04/09/2017 revealed a low normal  B12 with normal MMA thus r/o B12 deficiency.  SPEP revealed no monoclonal protein.  Free light chain ratio was 2.43 (0.26-1.65), unclear significance. Ferritin, iron saturation, and folate were normal.  TSH is normal.  Retic was 1.2% (inappropriately low) on 09/07/2016 and 1.1% on 06/23/2017.  Coombs was negative on 10/19/2016 and 06/24/2017.  Creatinine was 1.41 (CrCl 36 ml/min) on 06/29/2017.    24 hour urine (IFE) on 12/28/2016 revealed no monoclonal protein. 24 hour urine on 06/23/2017 revealed kappa free light chains 157 (1.35-24.19), lambda free light chains 6.99 (0.24-6.66), and free light chain ratio 22.46 (2.04-10.37).  Kappa free light chains were 159.9 (ratio 2.79) on 06/23/2017.  SPEP was normal on 06/23/2017.  Bone marrow aspirate and biopsy on 07/07/2017 revealed a variably cellular bone marrow with trilineage hematopoiesis.  There was slight plasmacytosis (4%).  Plasma cells were polyclonal for kappa and lambda light chains.  There were several lymphoid aggregates.  Congo stain for amyloid was negative.  Flow cytometry revealed no monoclonal B-cell population or abnormal T-cell population.  Cytogenetics were normal (36, XX).  She has a history of chronic hyponatremia (dating back to 07/2012).  She is followed by nephrology.  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  She is on fluid restriction. Cortisol and  uric acid were normal on 06/24/2017.   She has scleroderma.  She has skin thickening at sites of abrasion.  She has hypercalcemia felt related to thiazide diuretic and excess calcium intake.  She was seen by endocrinology.  PTH was 14 (low) and not c/w primary hyperparathyroidism.  TSH was normal.  PTH-rp on 06/23/2017 was < 2.0.  ACE level was 47 (normal) on 06/23/2017.  Calcitriol (vitamin D 1, 25 dihydroxy) was 75.3 (19.9-79.3).  Vitamin D, 25 hydroxy was 68.6 (30-100) on 06/22/2017.  She denies excess vitamin A and vitamin D intake.    Symptomatically, she feels well. She  denies any acute complains. Weight down 1 pound.  Exam stable.  Hematocrit is 31.1 with a hemoglobin of 10.6. Sodium 131 (improved). Continues on daily oral sodium tablet.   Plan: 1. Labs today: CBC with diff, CMP 2. Review recent mammogram and ultrasound - negative for malignancy.  Heterogeneously dense breast. 3. Discuss anemia of chronic disease. Patient responding appropriately to injections. Hemoglobin 10.6 today. Will continue labs and +/- Procrit injection every 2 weeks. Hemoglobin is above goal, therefore no Procrit injection needed today.     4. Discuss consult with endocrinology.  She was seen in consult by Dr. Gabriel Carina. PTH low and urinary calcium elevated, therefore HYPERcalcemia was felt to be secondary to medications (diuretics, exogenous calcium).  HYPOnatremia attributed to SIADH.  Avoid exogenous calcium sources and restrict free water as discussed. Patient to follow-up with endocrinology in 3 months. 5. Continue tamoxifen as previously prescribed. 6. Bone density scheduled for 11/08/2017. 7. RTC every 2 weeks for labs (CBC) and +/- Procrit.  8. RTC 3 months for MD assessment, labs (CBC with diff, CMP), and +/- Procrit.    Honor Loh, NP  09/21/2017, 11:44 AM  I saw and evaluated the patient, participating in the key portions of the service and reviewing pertinent diagnostic studies and records.  I reviewed the nurse practitioner's note and agree with the findings and the plan.  Several questions were asked by the patient and answered.   Nolon Stalls, MD 09/21/2017,11:44 AM

## 2017-09-20 NOTE — Telephone Encounter (Signed)
This encounter was created in error - please disregard.

## 2017-09-20 NOTE — Addendum Note (Signed)
Addended by: Honor Loh on: 09/20/2017 12:36 PM   Modules accepted: Level of Service, SmartSet

## 2017-09-21 ENCOUNTER — Inpatient Hospital Stay: Payer: PPO

## 2017-09-21 ENCOUNTER — Other Ambulatory Visit: Payer: Self-pay

## 2017-09-21 ENCOUNTER — Inpatient Hospital Stay (HOSPITAL_BASED_OUTPATIENT_CLINIC_OR_DEPARTMENT_OTHER): Payer: PPO | Admitting: Hematology and Oncology

## 2017-09-21 ENCOUNTER — Encounter: Payer: Self-pay | Admitting: Hematology and Oncology

## 2017-09-21 VITALS — BP 179/77 | HR 96 | Resp 18 | Wt 143.8 lb

## 2017-09-21 DIAGNOSIS — C50412 Malignant neoplasm of upper-outer quadrant of left female breast: Secondary | ICD-10-CM | POA: Diagnosis not present

## 2017-09-21 DIAGNOSIS — E871 Hypo-osmolality and hyponatremia: Secondary | ICD-10-CM | POA: Diagnosis not present

## 2017-09-21 DIAGNOSIS — D649 Anemia, unspecified: Secondary | ICD-10-CM | POA: Diagnosis not present

## 2017-09-21 DIAGNOSIS — D631 Anemia in chronic kidney disease: Secondary | ICD-10-CM

## 2017-09-21 DIAGNOSIS — M810 Age-related osteoporosis without current pathological fracture: Secondary | ICD-10-CM | POA: Diagnosis not present

## 2017-09-21 DIAGNOSIS — J449 Chronic obstructive pulmonary disease, unspecified: Secondary | ICD-10-CM | POA: Diagnosis not present

## 2017-09-21 DIAGNOSIS — N189 Chronic kidney disease, unspecified: Secondary | ICD-10-CM

## 2017-09-21 LAB — CBC WITH DIFFERENTIAL/PLATELET
Basophils Absolute: 0.1 10*3/uL (ref 0–0.1)
Basophils Relative: 2 %
Eosinophils Absolute: 0.2 10*3/uL (ref 0–0.7)
Eosinophils Relative: 5 %
HCT: 31.1 % — ABNORMAL LOW (ref 35.0–47.0)
Hemoglobin: 10.6 g/dL — ABNORMAL LOW (ref 12.0–16.0)
Lymphocytes Relative: 22 %
Lymphs Abs: 1 10*3/uL (ref 1.0–3.6)
MCH: 33.5 pg (ref 26.0–34.0)
MCHC: 34.1 g/dL (ref 32.0–36.0)
MCV: 98.1 fL (ref 80.0–100.0)
Monocytes Absolute: 0.8 10*3/uL (ref 0.2–0.9)
Monocytes Relative: 16 %
Neutro Abs: 2.6 10*3/uL (ref 1.4–6.5)
Neutrophils Relative %: 55 %
Platelets: 262 10*3/uL (ref 150–440)
RBC: 3.17 MIL/uL — ABNORMAL LOW (ref 3.80–5.20)
RDW: 12.7 % (ref 11.5–14.5)
WBC: 4.7 10*3/uL (ref 3.6–11.0)

## 2017-09-21 LAB — COMPREHENSIVE METABOLIC PANEL
ALT: 14 U/L (ref 14–54)
AST: 20 U/L (ref 15–41)
Albumin: 3.9 g/dL (ref 3.5–5.0)
Alkaline Phosphatase: 50 U/L (ref 38–126)
Anion gap: 10 (ref 5–15)
BUN: 23 mg/dL — ABNORMAL HIGH (ref 6–20)
CO2: 24 mmol/L (ref 22–32)
Calcium: 9.3 mg/dL (ref 8.9–10.3)
Chloride: 97 mmol/L — ABNORMAL LOW (ref 101–111)
Creatinine, Ser: 1.16 mg/dL — ABNORMAL HIGH (ref 0.44–1.00)
GFR calc Af Amer: 53 mL/min — ABNORMAL LOW (ref >60)
GFR calc non Af Amer: 46 mL/min — ABNORMAL LOW (ref >60)
Glucose, Bld: 105 mg/dL — ABNORMAL HIGH (ref 65–99)
Potassium: 4.2 mmol/L (ref 3.5–5.1)
Sodium: 131 mmol/L — ABNORMAL LOW (ref 135–145)
Total Bilirubin: 0.2 mg/dL — ABNORMAL LOW (ref 0.3–1.2)
Total Protein: 8 g/dL (ref 6.5–8.1)

## 2017-09-21 NOTE — Progress Notes (Signed)
Patient here today for follow up.   

## 2017-09-23 NOTE — Telephone Encounter (Signed)
Pt has been scheduled 10/27/17 with Dr Rockey Situ

## 2017-10-05 ENCOUNTER — Other Ambulatory Visit: Payer: Self-pay

## 2017-10-05 ENCOUNTER — Inpatient Hospital Stay: Payer: PPO

## 2017-10-05 ENCOUNTER — Inpatient Hospital Stay: Payer: PPO | Attending: Hematology and Oncology

## 2017-10-05 DIAGNOSIS — C50412 Malignant neoplasm of upper-outer quadrant of left female breast: Secondary | ICD-10-CM

## 2017-10-05 LAB — CBC WITH DIFFERENTIAL/PLATELET
Basophils Absolute: 0.1 10*3/uL (ref 0–0.1)
Basophils Relative: 1 %
Eosinophils Absolute: 0.2 10*3/uL (ref 0–0.7)
Eosinophils Relative: 4 %
HCT: 30.6 % — ABNORMAL LOW (ref 35.0–47.0)
Hemoglobin: 10.4 g/dL — ABNORMAL LOW (ref 12.0–16.0)
Lymphocytes Relative: 15 %
Lymphs Abs: 1 10*3/uL (ref 1.0–3.6)
MCH: 33 pg (ref 26.0–34.0)
MCHC: 34.2 g/dL (ref 32.0–36.0)
MCV: 96.6 fL (ref 80.0–100.0)
Monocytes Absolute: 1 10*3/uL — ABNORMAL HIGH (ref 0.2–0.9)
Monocytes Relative: 17 %
Neutro Abs: 4 10*3/uL (ref 1.4–6.5)
Neutrophils Relative %: 63 %
Platelets: 266 10*3/uL (ref 150–440)
RBC: 3.16 MIL/uL — ABNORMAL LOW (ref 3.80–5.20)
RDW: 12.8 % (ref 11.5–14.5)
WBC: 6.3 10*3/uL (ref 3.6–11.0)

## 2017-10-19 ENCOUNTER — Other Ambulatory Visit: Payer: Self-pay

## 2017-10-19 ENCOUNTER — Inpatient Hospital Stay: Payer: PPO

## 2017-10-19 DIAGNOSIS — C50412 Malignant neoplasm of upper-outer quadrant of left female breast: Secondary | ICD-10-CM | POA: Diagnosis not present

## 2017-10-19 LAB — CBC WITH DIFFERENTIAL/PLATELET
Basophils Absolute: 0.1 10*3/uL (ref 0–0.1)
Basophils Relative: 1 %
Eosinophils Absolute: 0.5 10*3/uL (ref 0–0.7)
Eosinophils Relative: 8 %
HCT: 30.1 % — ABNORMAL LOW (ref 35.0–47.0)
Hemoglobin: 10.3 g/dL — ABNORMAL LOW (ref 12.0–16.0)
Lymphocytes Relative: 16 %
Lymphs Abs: 1 10*3/uL (ref 1.0–3.6)
MCH: 32.8 pg (ref 26.0–34.0)
MCHC: 34.1 g/dL (ref 32.0–36.0)
MCV: 96.3 fL (ref 80.0–100.0)
Monocytes Absolute: 1.1 10*3/uL — ABNORMAL HIGH (ref 0.2–0.9)
Monocytes Relative: 18 %
Neutro Abs: 3.4 10*3/uL (ref 1.4–6.5)
Neutrophils Relative %: 57 %
Platelets: 328 10*3/uL (ref 150–440)
RBC: 3.13 MIL/uL — ABNORMAL LOW (ref 3.80–5.20)
RDW: 12.6 % (ref 11.5–14.5)
WBC: 6 10*3/uL (ref 3.6–11.0)

## 2017-10-24 NOTE — Progress Notes (Signed)
Cardiology Office Note  Date:  10/27/2017   ID:  NYGERIA LAGER, DOB November 04, 1945, MRN [id number]  PCP:  Sofie Hartigan, MD   Chief Complaint  Patient presents with  . other    6 month f/u no complaints today. Meds reviewed verbally with pt.    HPI:  Ms. Capell is a 72 year old woman with history of  smoking,1 ppd,  40 years, COPD breast cancer on the left with mastectomy,  hyponatremia, low potassium hypertension  who presents by referral to the hospita l by primary care after blood work showing troponin 1.47 in the setting of left-sided chest pain on 07/15/2016.  outpt cardiac cath , no CAD, demand ischemia secondary to N/V, calcified LAD CT scan chest  significant coronary calcifications particularly in the LAD, and aorta She presents for follow-up of her chest pain  Has been in the hospital, low sodium Feb and March 2019 Treated with sodium tabs, restricting free water, on Lasix  Active in her garden, Spending time with her husband canning her green beans Denies any chest pain or shortness of breath with exertion  Previous  echocardiogram and cardiac catheterization results, CT scan results reviewed  Previous hospitalization March 2018 Stressors included dry heaves, nausea for 3-4 days prior to admission  left side chest pain, SOB significant diarrhea, potassium 2.9, sodium 123  CT scan chest in the hospital significant coronary calcifications particularly in the LAD, and aorta  07/16/16 Left ventricle: ejection fraction was in the range of 60% to 65%. Wall motion was normal Mitral valve: There was mild to moderate regurgitation. Left atrium: The atrium was mildly dilated. Pulmonary arteries: PA peak pressure: 53 mm Hg (S).  Cath 07/22/16 No hemodynamically significant stenoses Normal right heart pressures Diffuse calcified plaque, predominantly in the LAD Normal LV function ejection fraction greater than 55%  EKG personally reviewed by myself on todays  visit Shows normal sinus rhythm rate 64 bpm no significant ST or T-wave changes   PMH:   has a past medical history of Anemia, Anxiety disorder, Breast cancer of upper-outer quadrant of left female breast ([place]) (11/2015), Cancer ([place]) (12/03/2015), Cough, Family history of adverse reaction to anesthesia, GERD (gastroesophageal reflux disease), H/O: hysterectomy, Hypertension, Osteopenia, Osteoporosis, Pericarditis, Personal history of tobacco use, presenting hazards to health (10/31/2015), Scleroderma ([place]), UTI (lower urinary tract infection), and Wears dentures.  PSH:    Past Surgical History:  Procedure Laterality Date  . BREAST BIOPSY Right 2012   core - neg  . BREAST BIOPSY Left 12/03/2015   INVASIVE LOBULAR CARCINOMA.   Marland Kitchen CATARACT EXTRACTION W/ INTRAOCULAR LENS IMPLANT Right   . COLONOSCOPY WITH PROPOFOL N/A 11/08/2015   Procedure: COLONOSCOPY WITH PROPOFOL;  Surgeon: Lucilla Lame, MD;  Location: [facility];  Service: Endoscopy;  Laterality: N/A;  . EVACUATION BREAST HEMATOMA Left 01/14/2016   Procedure: EVACUATION HEMATOMA BREAST;  Surgeon: Robert Bellow, MD;  Location: [facility];  Service: General;  Laterality: Left;  Marland Kitchen MASTECTOMY Left 2017   complete mastectomy  . MASTECTOMY W/ SENTINEL NODE BIOPSY Left 12/26/2015   Procedure: MASTECTOMY WITH SENTINEL LYMPH NODE BIOPSY;  Surgeon: Robert Bellow, MD;  Location: [facility];  Service: General;  Laterality: Left;  . RIGHT/LEFT HEART CATH AND CORONARY ANGIOGRAPHY N/A 07/22/2016   Procedure: Right/Left Heart Cath and Coronary Angiography;  Surgeon: Minna Merritts, MD;  Location: [facility];  Service: Cardiovascular;  Laterality: N/A;  . TUBAL LIGATION    . VESICOVAGINAL FISTULA CLOSURE W/ TAH  Current Outpatient Medications  Medication Sig Dispense Refill  . albuterol (PROVENTIL) (2.5 MG/3ML) 0.083% nebulizer solution Take 3 mLs (2.5 mg total) by nebulization every 6 (six) hours as needed for wheezing or  shortness of breath. 75 mL 12  . ALPRAZolam (XANAX) 0.5 MG tablet 1/2 tablet twice daily as needed 30 tablet 5  . aspirin EC 81 MG tablet Take 81 mg by mouth daily.    . busPIRone (BUSPAR) 5 MG tablet 1 Tablet, Oral QHS (Patient taking differently: Take 5 mg by mouth at bedtime. ) 30 tablet 12  . fluticasone (FLONASE) 50 MCG/ACT nasal spray Place 2 sprays into the nose daily as needed for allergies or rhinitis.     . furosemide (LASIX) 40 MG tablet Take 1 tablet (40 mg total) by mouth daily. 30 tablet 2  . hydrALAZINE (APRESOLINE) 25 MG tablet Take 1 tablet (25 mg total) by mouth 2 (two) times daily. 60 tablet 11  . lisinopril (PRINIVIL,ZESTRIL) 20 MG tablet Take 1 tablet (20 mg total) by mouth daily. 30 tablet 2  . metoprolol succinate (TOPROL-XL) 25 MG 24 hr tablet Take 1 tablet (25 mg total) by mouth daily. 90 tablet 3  . nitroGLYCERIN (NITROSTAT) 0.4 MG SL tablet Place 1 tablet (0.4 mg total) under the tongue every 5 (five) minutes as needed for chest pain. 30 tablet 0  . omeprazole (PRILOSEC) 20 MG capsule Take 20 mg by mouth daily as needed. For heartburn    . ondansetron (ZOFRAN) 4 MG tablet Take 1 tablet (4 mg total) by mouth every 8 (eight) hours as needed for nausea or vomiting. 20 tablet 0  . potassium chloride SA (K-DUR,KLOR-CON) 20 MEQ tablet Take 1 tablet (20 mEq total) by mouth daily. 30 tablet 0  . sodium chloride 1 g tablet Take 1 g by mouth 2 (two) times daily with a meal.     . tamoxifen (NOLVADEX) 20 MG tablet Take 1 tablet (20 mg total) by mouth daily. 90 tablet 0   No current facility-administered medications for this visit.      Allergies:   Pimenta and Tomato   Social History:  The patient  reports that she quit smoking about 13 years ago. Her smoking use included cigarettes. She has a 30.00 pack-year smoking history. She has never used smokeless tobacco. She reports that she drinks about 0.6 - 1.8 oz of alcohol per week. She reports that she does not use drugs.    Family History:   family history includes Anxiety disorder in her sister; Arthritis in her brother and sister; COPD in her brother, brother, and father; Diabetes in her unknown relative; Epilepsy in her mother; Heart attack in her paternal grandfather; Heart disease in her brother and father; Heart failure in her mother; Kidney failure in her brother; Uterine cancer in her unknown relative; Vaginal cancer in her paternal grandmother.    Review of Systems: Review of Systems  Constitutional: Negative.   Respiratory: Negative.   Cardiovascular: Negative.   Gastrointestinal: Negative.   Musculoskeletal: Negative.   Neurological: Negative.   Psychiatric/Behavioral: Negative.   All other systems reviewed and are negative.    PHYSICAL EXAM: VS:  BP 130/60 (BP Location: Right Arm, Patient Position: Sitting, Cuff Size: Normal)   Pulse 64   Ht 5\' 1"  (1.549 m)   Wt 141 lb 8 oz (64.2 kg)   BMI 26.74 kg/m  , BMI Body mass index is 26.74 kg/m. Constitutional:  oriented to person, place, and time. No distress.  HENT:  Head: Normocephalic and atraumatic.  Eyes:  no discharge. No scleral icterus.  Neck: Normal range of motion. Neck supple. No JVD present.  Cardiovascular: Normal rate, regular rhythm, normal heart sounds and intact distal pulses. Exam reveals no gallop and no friction rub. No edema No murmur heard. Pulmonary/Chest: Effort normal and breath sounds normal. No stridor. No respiratory distress.  no wheezes.  no rales.  no tenderness.  Abdominal: Soft.  no distension.  no tenderness.  Musculoskeletal: Normal range of motion.  no  tenderness or deformity.  Neurological:  normal muscle tone. Coordination normal. No atrophy Skin: Skin is warm and dry. No rash noted. not diaphoretic.  Psychiatric:  normal mood and affect. behavior is normal. Thought content normal.       Recent Labs: 06/22/2017: Magnesium 1.4; TSH 1.605 09/21/2017: ALT 14; BUN 23; Creatinine, Ser 1.16; Potassium  4.2; Sodium 131 10/19/2017: Hemoglobin 10.3; Platelets 328    Lipid Panel Lab Results  Component Value Date   CHOL 204 (H) 03/29/2017   HDL 55 03/29/2017   LDLCALC 122 (H) 03/29/2017   TRIG 155 (H) 03/29/2017      Wt Readings from Last 3 Encounters:  10/27/17 141 lb 8 oz (64.2 kg)  09/21/17 143 lb 12.8 oz (65.2 kg)  08/12/17 144 lb 12.8 oz (65.7 kg)       ASSESSMENT AND PLAN:  Essential (primary) hypertension Blood pressure is well controlled on today's visit. No changes made to the medications. stable  Non-ST elevation (NSTEMI) myocardial infarction [facility])  cardiac catheterization, no obstructive lesions, significant coronary calcification in the LAD particularly She is currently not on a statin She was previously on Zocor, presumably stopped on her own  Hypercholesteremia  heavy calcification LAD Will defer to primary care but would probably recommend she restart her simvastatin  Anxiety recommended she start regular walking program Overall husband reports she is doing well  Hyponatremia Several hospitalizations February and March 2019 Numbers seem to be stable sodium 131 Avoiding free water, on sodium tabs, followed by nephrology  Smoker Reports that she is not a smoker currently  Shortness of breath Shortness of breath improved Moderately elevated right heart pressures on echocardiogram on in the hospital 2018 Continues on Lasix with potassium   Total encounter time more than 25 minutes  Greater than 50% was spent in counseling and coordination of care with the patient   Disposition:   F/U  12 months as needed   Orders Placed This Encounter  Procedures  . EKG 12-Lead     Signed, Esmond Plants, M.D., Ph.D. 10/27/2017  [place], [place]

## 2017-10-27 ENCOUNTER — Ambulatory Visit: Payer: PPO | Admitting: Cardiovascular Disease

## 2017-10-27 ENCOUNTER — Encounter: Payer: Self-pay | Admitting: Cardiovascular Disease

## 2017-10-27 VITALS — BP 130/60 | HR 64 | Ht 61.0 in | Wt 141.5 lb

## 2017-10-27 DIAGNOSIS — J449 Chronic obstructive pulmonary disease, unspecified: Secondary | ICD-10-CM

## 2017-10-27 DIAGNOSIS — I25110 Atherosclerotic heart disease of native coronary artery with unstable angina pectoris: Secondary | ICD-10-CM | POA: Diagnosis not present

## 2017-10-27 DIAGNOSIS — R0602 Shortness of breath: Secondary | ICD-10-CM | POA: Diagnosis not present

## 2017-10-27 DIAGNOSIS — N189 Chronic kidney disease, unspecified: Secondary | ICD-10-CM

## 2017-10-27 DIAGNOSIS — D631 Anemia in chronic kidney disease: Secondary | ICD-10-CM | POA: Diagnosis not present

## 2017-10-27 NOTE — Patient Instructions (Signed)
Medication Instructions:   No medication changes made  Labwork:  No new labs needed  Testing/Procedures:  No further testing at this time   Follow-Up: It was a pleasure seeing you in the office today. Please call us if you have new issues that need to be addressed before your next appt.  [phone]  Your physician wants you to follow-up in: 12 months as needed You will receive a reminder letter in the mail two months in advance. If you don't receive a letter, please call our office to schedule the follow-up appointment.  If you need a refill on your cardiac medications before your next appointment, please call your pharmacy.  For educational health videos Log in to : [email or web address] Or : [email or web address], password : [email or web address]  

## 2017-10-29 ENCOUNTER — Telehealth: Payer: Self-pay | Admitting: *Deleted

## 2017-10-29 NOTE — Telephone Encounter (Signed)
No answer. No voicemail. 

## 2017-10-29 NOTE — Telephone Encounter (Signed)
-----   Message from Minna Merritts, MD sent at 10/27/2017  6:00 PM EDT ----- I reviewed my note from 2018 at which time she was on simvastatin She was not on simvastatin when seen in clinic Wednesday, July 3 Did she stopped this on her own? There is heavy coronary calcification on previous CT scan Would defer to her whether she would like to stay on the medication thx TG

## 2017-11-02 ENCOUNTER — Inpatient Hospital Stay: Payer: PPO

## 2017-11-02 ENCOUNTER — Inpatient Hospital Stay: Payer: PPO | Attending: Hematology and Oncology

## 2017-11-02 ENCOUNTER — Other Ambulatory Visit: Payer: Self-pay

## 2017-11-02 VITALS — BP 154/64

## 2017-11-02 DIAGNOSIS — N189 Chronic kidney disease, unspecified: Secondary | ICD-10-CM | POA: Diagnosis not present

## 2017-11-02 DIAGNOSIS — D631 Anemia in chronic kidney disease: Secondary | ICD-10-CM | POA: Insufficient documentation

## 2017-11-02 DIAGNOSIS — C50412 Malignant neoplasm of upper-outer quadrant of left female breast: Secondary | ICD-10-CM

## 2017-11-02 LAB — CBC WITH DIFFERENTIAL/PLATELET
Basophils Absolute: 0.1 10*3/uL (ref 0–0.1)
Basophils Relative: 1 %
Eosinophils Absolute: 0.4 10*3/uL (ref 0–0.7)
Eosinophils Relative: 7 %
HCT: 27.8 % — ABNORMAL LOW (ref 35.0–47.0)
Hemoglobin: 9.9 g/dL — ABNORMAL LOW (ref 12.0–16.0)
Lymphocytes Relative: 10 %
Lymphs Abs: 0.5 10*3/uL — ABNORMAL LOW (ref 1.0–3.6)
MCH: 33.2 pg (ref 26.0–34.0)
MCHC: 35.6 g/dL (ref 32.0–36.0)
MCV: 93.3 fL (ref 80.0–100.0)
Monocytes Absolute: 0.9 10*3/uL (ref 0.2–0.9)
Monocytes Relative: 16 %
Neutro Abs: 3.8 10*3/uL (ref 1.4–6.5)
Neutrophils Relative %: 66 %
Platelets: 251 10*3/uL (ref 150–440)
RBC: 2.98 MIL/uL — ABNORMAL LOW (ref 3.80–5.20)
RDW: 13 % (ref 11.5–14.5)
WBC: 5.7 10*3/uL (ref 3.6–11.0)

## 2017-11-02 MED ORDER — SIMVASTATIN 40 MG PO TABS: 40.0000 mg | ORAL_TABLET | Freq: Every day | ORAL | 3 refills | Status: DC

## 2017-11-02 MED ORDER — EPOETIN ALFA 20000 UNIT/ML IJ SOLN
10000.0000 [IU] | Freq: Once | INTRAMUSCULAR | Status: AC
  Administered 2017-11-02: 10000 [IU] via SUBCUTANEOUS

## 2017-11-02 NOTE — Telephone Encounter (Signed)
Spoke with patient and her spouse regarding the simvastatin and importance based on her results. She does not remember taking that medication but is willing to try it again. Sent in Simvastatin 40 mg once daily into her pharmacy of choice. She verbalized understanding with no further questions at this time.

## 2017-11-08 ENCOUNTER — Ambulatory Visit
Admission: RE | Admit: 2017-11-08 | Discharge: 2017-11-08 | Disposition: A | Discharge status: Home / Self Care | Payer: PPO | Source: Ambulatory Visit | Attending: Urgent Care | Admitting: Urgent Care

## 2017-11-08 DIAGNOSIS — M818 Other osteoporosis without current pathological fracture: Secondary | ICD-10-CM | POA: Diagnosis not present

## 2017-11-08 DIAGNOSIS — C50412 Malignant neoplasm of upper-outer quadrant of left female breast: Secondary | ICD-10-CM | POA: Diagnosis not present

## 2017-11-08 DIAGNOSIS — Z780 Asymptomatic menopausal state: Secondary | ICD-10-CM | POA: Diagnosis not present

## 2017-11-08 DIAGNOSIS — M8589 Other specified disorders of bone density and structure, multiple sites: Secondary | ICD-10-CM | POA: Diagnosis not present

## 2017-11-08 DIAGNOSIS — R1013 Epigastric pain: Secondary | ICD-10-CM | POA: Diagnosis not present

## 2017-11-08 DIAGNOSIS — R112 Nausea with vomiting, unspecified: Secondary | ICD-10-CM | POA: Diagnosis not present

## 2017-11-08 DIAGNOSIS — E871 Hypo-osmolality and hyponatremia: Secondary | ICD-10-CM | POA: Diagnosis not present

## 2017-11-08 DIAGNOSIS — F419 Anxiety disorder, unspecified: Secondary | ICD-10-CM | POA: Diagnosis not present

## 2017-11-09 ENCOUNTER — Other Ambulatory Visit: Payer: Self-pay

## 2017-11-09 NOTE — Patient Outreach (Signed)
[facility]) Care Management  11/09/2017  Bianca Shaw 17-Jun-1945 [id number]   TELEPHONE SCREENING Referral date: 11/09/17 Referral source: primary MD referral Referral reason: co-pay assistance Insurance: [employer] Attempt #1  Telephone call to patient regarding primary MD referral. Unable to reach patient. HIPAA compliant voice message left with call back phone number.  PLAN: RNCM will attempt 2nd telephone call to patient. RNCM will send outreach letter to attempt contact.  Quinn Plowman RN,BSN,CCM [facility] Telephonic  [phone]

## 2017-11-12 ENCOUNTER — Other Ambulatory Visit: Payer: Self-pay

## 2017-11-12 ENCOUNTER — Encounter: Payer: Self-pay | Admitting: Family Medicine

## 2017-11-12 NOTE — Patient Outreach (Signed)
[facility]) Care Management  11/12/2017  IMO CUMBIE [birth year] [id number]  TELEPHONE SCREENING Referral date: 11/09/17 Referral source: primary MD referral Referral reason: co-pay assistance Insurance: [employer] advantage  Telephone call to patient regarding primary MD referral. HIPAA verified with patient. Patient gave verbal authorization to speak with her husband, Niema Carrara regarding her health information. Patient's husband states patient had a mastectomy in 2017. Husband states, " the doctors did lab work that was sent to [place]. I wasn't aware of this and now I've found out that this lab work was out of network."  Husband states the cost is between $4, 000 to $5,000. RNCM informed Husband states  that [facility] care management does not cover co-payments.   Husband verbalized understanding. RNCM discussed [facility] care management services. Husband states patient does not need the services at this time. Appreciated call. RNCM offered to send [facility] care management brochure/magnet for future reference. Husband verbally agreed.   PLAN; RNCM will close patient due to refusal of services.  RNCM will send patient [facility] care management brochure / magnet  as discussed. RNCM will send closure notification to patients primary MD.   Quinn Plowman RN,BSN,CCM [facility] Telephonic  [phone]

## 2017-11-15 ENCOUNTER — Encounter: Payer: Self-pay | Admitting: Gastroenterology

## 2017-11-15 ENCOUNTER — Ambulatory Visit: Payer: PPO | Admitting: Gastroenterology

## 2017-11-15 VITALS — BP 133/59 | HR 59 | Ht 60.0 in | Wt 137.6 lb

## 2017-11-15 DIAGNOSIS — R112 Nausea with vomiting, unspecified: Secondary | ICD-10-CM

## 2017-11-15 DIAGNOSIS — R1084 Generalized abdominal pain: Secondary | ICD-10-CM | POA: Diagnosis not present

## 2017-11-15 DIAGNOSIS — R1013 Epigastric pain: Secondary | ICD-10-CM | POA: Diagnosis not present

## 2017-11-15 NOTE — Progress Notes (Signed)
Bianca Shaw [address]  [place], [place] [address]  Main: [phone]  Fax: [phone]   Gastroenterology Consultation  Referring Provider:     Sofie Hartigan, MD Primary Care Physician:  Sofie Hartigan, MD Primary Gastroenterologist:  Dr. Vonda Shaw Reason for Consultation:     Intermittent nausea and vomiting        HPI:    Chief Complaint  Patient presents with  . Abdominal Pain    referral from Dr. Corliss Blacker, MD  . Nausea  . Emesis  . Establish Care    Bianca Shaw is a 72 y.o. y/o female referred for consultation & management  by Dr. Ellison Hughs, Chrissie Noa, MD.  Patient reports 1 month history of intermittent nausea and vomiting.  Occurs every other day.  Occurs even when she is not eating.  Specifically also occurs with her salt tablets she is taking for hyponatremia.  She states she puts the salt tablet in her hand and is about to eat is and gets nauseous just looking at it, but does not always throw up.  At times, she feels nauseous even when she has not eaten, and will have emesis which consists of food contents.  No hematemesis.  No altered bowel habits.  Reports a bowel movement every other day without melena or hematochezia.  No weight loss.  No dysphagia.  Reports acid reflux 2-3 times a week, and her PPI which is changed from omeprazole to pantoprazole, and she states her symptoms are better on this.  She also reports intermittent right upper quadrant pain that is unrelated to meals, only present on palpation, does not occur with nausea and vomiting.  Also had similar symptoms in January 2019, but states at that time was dealing with hyponatremia, and symptoms got better after that.  Previous work-up reveals that she had a CT scan in February 2019 for nausea and vomiting that did not find any acute process in the abdomen or pelvis to explain her symptoms.  Normal gallbladder without biliary duct dilation.  In 2010 she was  also seen by Dr. Owens Loffler of GI, for nausea.  Upper endoscopy was recommended, but patient canceled the procedure at the time.  No prior EGDs Last colonoscopy was in 2017 for screening by Dr. Allen Norris, and was normal except for diverticulosis and hemorrhoids, and repeat recommended in 10 years.  Past Medical History:  Diagnosis Date  . Anemia   . Anxiety disorder   . Breast cancer of upper-outer quadrant of left female breast ([facility]) 11/2015   pT2 pN0(i+).;ER+; PR +, her 2 neu not overexpressed.  Mastectomy, SLN, Mammoprint: Low risk.   . Cancer ([facility]) 12/03/2015   left breast/ INVASIVE LOBULAR CARCINOMA.   . Cough    lingering, mild, finished Prednisone and anitbiotic 11/03/15  . Family history of adverse reaction to anesthesia    sister - PONV  . GERD (gastroesophageal reflux disease)   . H/O: hysterectomy   . Hypertension   . Osteopenia   . Osteoporosis   . Pericarditis    diagnonsed June, 2010, unclear etiology as of yer  . Personal history of tobacco use, presenting hazards to health 10/31/2015  . Scleroderma ([facility])    ONLY ON SKIN-MILD  . UTI (lower urinary tract infection)   . Wears dentures    full upper    Past Surgical History:  Procedure Laterality Date  . BREAST BIOPSY Right 2012   core - neg  .  BREAST BIOPSY Left 12/03/2015   INVASIVE LOBULAR CARCINOMA.   Marland Kitchen CATARACT EXTRACTION W/ INTRAOCULAR LENS IMPLANT Right   . COLONOSCOPY WITH PROPOFOL N/A 11/08/2015   Procedure: COLONOSCOPY WITH PROPOFOL;  Surgeon: Lucilla Lame, MD;  Location: [facility];  Service: Endoscopy;  Laterality: N/A;  . EVACUATION BREAST HEMATOMA Left 01/14/2016   Procedure: EVACUATION HEMATOMA BREAST;  Surgeon: Robert Bellow, MD;  Location: [facility];  Service: General;  Laterality: Left;  Marland Kitchen MASTECTOMY Left 2017   complete mastectomy  . MASTECTOMY W/ SENTINEL NODE BIOPSY Left 12/26/2015   Procedure: MASTECTOMY WITH SENTINEL LYMPH NODE BIOPSY;  Surgeon: Robert Bellow, MD;  Location: [facility]  [facility];  Service: General;  Laterality: Left;  . RIGHT/LEFT HEART CATH AND CORONARY ANGIOGRAPHY N/A 07/22/2016   Procedure: Right/Left Heart Cath and Coronary Angiography;  Surgeon: Minna Merritts, MD;  Location: [facility];  Service: Cardiovascular;  Laterality: N/A;  . TUBAL LIGATION    . VESICOVAGINAL FISTULA CLOSURE W/ TAH      Prior to Admission medications   Medication Sig Start Date End Date Taking? Authorizing Provider  albuterol (PROVENTIL) (2.5 MG/3ML) 0.083% nebulizer solution Take 3 mLs (2.5 mg total) by nebulization every 6 (six) hours as needed for wheezing or shortness of breath. 06/06/15  Yes Mar Daring, PA-C  aspirin EC 81 MG tablet Take 81 mg by mouth daily.   Yes [provider]  diazepam (VALIUM) 5 MG tablet  11/08/17  Yes [provider]  fluticasone (FLONASE) 50 MCG/ACT nasal spray Place 2 sprays into the nose daily as needed for allergies or rhinitis.    Yes [provider]  furosemide (LASIX) 20 MG tablet  11/08/17  Yes [provider]  hydrALAZINE (APRESOLINE) 25 MG tablet Take 1 tablet (25 mg total) by mouth 2 (two) times daily. 04/29/17  Yes Jerrol Banana., MD  lisinopril (PRINIVIL,ZESTRIL) 20 MG tablet Take 1 tablet (20 mg total) by mouth daily. 06/26/17  Yes Gladstone Lighter, MD  metoprolol succinate (TOPROL-XL) 25 MG 24 hr tablet Take 1 tablet (25 mg total) by mouth daily. 05/24/17  Yes Jerrol Banana., MD  nitroGLYCERIN (NITROSTAT) 0.4 MG SL tablet Place 1 tablet (0.4 mg total) under the tongue every 5 (five) minutes as needed for chest pain. 07/17/16  Yes Mody, Ulice Bold, MD  ondansetron (ZOFRAN) 4 MG tablet Take 1 tablet (4 mg total) by mouth every 8 (eight) hours as needed for nausea or vomiting. 06/02/17  Yes Mar Daring, PA-C  pantoprazole (PROTONIX) 40 MG tablet  11/08/17  Yes [provider]  potassium chloride SA (K-DUR,KLOR-CON) 20 MEQ tablet Take 1 tablet (20 mEq total) by mouth daily.  06/12/17  Yes Vaughan Basta, MD  simvastatin (ZOCOR) 40 MG tablet Take 1 tablet (40 mg total) by mouth daily. 11/02/17  Yes Gollan, Kathlene November, MD  sodium chloride 1 g tablet Take 1 g by mouth 2 (two) times daily with a meal.    Yes [provider]  tamoxifen (NOLVADEX) 20 MG tablet Take 1 tablet (20 mg total) by mouth daily. 08/02/17  Yes Lequita Asal, MD    Family History  Problem Relation Age of Onset  . Heart failure Mother   . Epilepsy Mother   . COPD Father   . Heart disease Father   . Anxiety disorder Sister   . Arthritis Brother   . Heart disease Brother   . Vaginal cancer Paternal Grandmother   . Heart attack  Paternal Grandfather   . COPD Brother   . Kidney failure Brother   . COPD Brother   . Arthritis Sister   . Uterine cancer Unknown   . Diabetes Unknown   . Colon cancer Neg Hx   . Stomach cancer Neg Hx   . Breast cancer Neg Hx      Social History   Tobacco Use  . Smoking status: Former Smoker    Packs/day: 0.75    Years: 40.00    Pack years: 30.00    Types: Cigarettes    Last attempt to quit: 11/07/2003    Years since quitting: 14.0  . Smokeless tobacco: Never Used  Substance Use Topics  . Alcohol use: Yes    Alcohol/week: 0.6 - 1.8 oz    Types: 1 - 3 Glasses of wine per week    Comment: Occasionally.    . Drug use: No    Allergies as of 11/15/2017 - Review Complete 11/15/2017  Allergen Reaction Noted  . Pimenta Nausea And Vomiting 11/04/2015  . Tomato  06/22/2017    Review of Systems:    All systems reviewed and negative except where noted in HPI.   Physical Exam:  BP (!) 133/59   Pulse (!) 59   Ht 5' (1.524 m)   Wt 137 lb 9.6 oz (62.4 kg)   BMI 26.87 kg/m  No LMP recorded. Patient is postmenopausal. Psych:  Alert and cooperative. Normal mood and affect. General:   Alert,  Well-developed, well-nourished, pleasant and cooperative in NAD Head:  Normocephalic and atraumatic. Eyes:  Sclera clear, no icterus.   Conjunctiva  pink. Ears:  Normal auditory acuity. Nose:  No deformity, discharge, or lesions. Mouth:  No deformity or lesions,oropharynx pink & moist. Neck:  Supple; no masses or thyromegaly. Lungs:  Respirations even and unlabored.  Clear throughout to auscultation.   No wheezes, crackles, or rhonchi. No acute distress. Heart:  Regular rate and rhythm; no murmurs, clicks, rubs, or gallops. Abdomen:  Normal bowel sounds.  No bruits.  Soft, non-tender and non-distended without masses, hepatosplenomegaly or hernias noted.  No guarding or rebound tenderness.    Msk:  Symmetrical without gross deformities. Good, equal movement & strength bilaterally. Pulses:  Normal pulses noted. Extremities:  No clubbing or edema.  No cyanosis. Neurologic:  Alert and oriented x3;  grossly normal neurologically. Skin:  Intact without significant lesions or rashes. No jaundice. Lymph Nodes:  No significant cervical adenopathy. Psych:  Alert and cooperative. Normal mood and affect.   Labs: CBC    Component Value Date/Time   WBC 5.7 11/02/2017 0812   RBC 2.98 (L) 11/02/2017 0812   HGB 9.9 (L) 11/02/2017 0812   HGB 10.7 (L) 05/24/2017 1613   HCT 27.8 (L) 11/02/2017 0812   HCT 30.3 (L) 05/24/2017 1613   PLT 251 11/02/2017 0812   PLT 250 05/24/2017 1613   MCV 93.3 11/02/2017 0812   MCV 95 05/24/2017 1613   MCV 102 (H) 07/11/2013 1115   MCH 33.2 11/02/2017 0812   MCHC 35.6 11/02/2017 0812   RDW 13.0 11/02/2017 0812   RDW 14.0 05/24/2017 1613   RDW 12.9 07/11/2013 1115   LYMPHSABS 0.5 (L) 11/02/2017 0812   LYMPHSABS 1.0 05/24/2017 1613   LYMPHSABS 0.9 (L) 07/11/2013 1115   MONOABS 0.9 11/02/2017 0812   MONOABS 0.8 07/11/2013 1115   EOSABS 0.4 11/02/2017 0812   EOSABS 0.6 (H) 05/24/2017 1613   EOSABS 0.1 07/11/2013 1115   BASOSABS 0.1 11/02/2017 5284  BASOSABS 0.0 05/24/2017 1613   BASOSABS 0.1 07/11/2013 1115   CMP     Component Value Date/Time   NA 131 (L) 09/21/2017 1054   NA 127 (L) 05/24/2017 1613    NA 125 (L) 07/11/2013 1115   K 4.2 09/21/2017 1054   K 4.5 07/11/2013 1115   CL 97 (L) 09/21/2017 1054   CL 90 (L) 07/11/2013 1115   CO2 24 09/21/2017 1054   CO2 25 07/11/2013 1115   GLUCOSE 105 (H) 09/21/2017 1054   GLUCOSE 115 (H) 07/11/2013 1115   BUN 23 (H) 09/21/2017 1054   BUN 25 05/24/2017 1613   BUN 15 07/11/2013 1115   CREATININE 1.16 (H) 09/21/2017 1054   CREATININE 1.16 (H) 03/29/2017 1024   CALCIUM 9.3 09/21/2017 1054   CALCIUM 11.6 (H) 06/11/2017 1339   PROT 8.0 09/21/2017 1054   PROT 6.7 05/24/2017 1613   PROT 8.2 07/11/2013 1115   ALBUMIN 3.9 09/21/2017 1054   ALBUMIN 3.4 (L) 05/24/2017 1613   ALBUMIN 3.8 07/11/2013 1115   AST 20 09/21/2017 1054   AST 23 07/11/2013 1115   ALT 14 09/21/2017 1054   ALT 22 07/11/2013 1115   ALKPHOS 50 09/21/2017 1054   ALKPHOS 72 07/11/2013 1115   BILITOT 0.2 (L) 09/21/2017 1054   BILITOT 0.3 05/24/2017 1613   BILITOT 0.3 07/11/2013 1115   GFRNONAA 46 (L) 09/21/2017 1054   GFRNONAA 47 (L) 03/29/2017 1024   GFRAA 53 (L) 09/21/2017 1054   GFRAA 54 (L) 03/29/2017 1024    Imaging Studies: Dg Bone Density  Result Date: 11/08/2017 EXAM: DUAL X-RAY ABSORPTIOMETRY (DXA) FOR BONE MINERAL DENSITY IMPRESSION: Dear Dr Cranford Mon, Your patient [name] completed a FRAX assessment on 11/08/2017 using the [place] (analysis version: 14.10) manufactured by [employer]. The following summarizes the results of our evaluation. PATIENT BIOGRAPHICAL: Name: Yumi, [place] Patient ID: [id number] Birth Date: [birth year] Height:    60.0 in. Gender:     [age]    [age]       Weight:    137.3 lbs. Ethnicity:  White                            Exam Date: 11/08/2017 FRAX* RESULTS:  (version: 3.5) 10-year Probability of Fracture1 Major Osteoporotic Fracture2 Hip Fracture 18.5% 7.1% Population: [place] (Caucasian) Risk Factors: Family Hist. (Parent hip fracture) Based on Femur (Left) Neck BMD 1 -The 10-year probability of fracture  may be lower than reported if the patient has received treatment. 2 -Major Osteoporotic Fracture: Clinical Spine, Forearm, Hip or Shoulder *FRAX is a Materials engineer of the [employer] for Metabolic Bone Disease, a [place] (WHO) [facility]. ASSESSMENT: The probability of a major osteoporotic fracture is 18.5% within the next ten years. The probability of a hip fracture is 7.1% within the next ten years. . Dear Dr Cranford Mon, Your patient Joeanna Howdyshell completed a BMD test on 11/08/2017 using the [place] (analysis version: 14.10) manufactured by [employer]. The following summarizes the results of our evaluation. PATIENT BIOGRAPHICAL: Name: Vanesha, [place] Patient ID: [id number] Birth Date: [birth year] Height: 60.0 in. Gender: Female Exam Date: 11/08/2017 Weight: 137.3 lbs. Indications: Advanced Age, Caucasian, COPD, Family Hx of Osteoporosis, Height Loss, High Risk Meds, History of Breast Cancer, History of Osteoporosis, Parent Hip Fracture, Postmenopausal, Family Hist. (Parent hip fracture) Fractures:  Treatments: Multi-Vitamin with calcium, Omeprazole, Tamoxifen ASSESSMENT: The BMD measured at AP Spine L1-L2 is 0.934 g/cm2 with a T-score of -2.0. This patient is considered osteopenic according to [facility]) criteria. L3/L4 were excluded due to degenerative changes. Site Region Measured Measured WHO Young Adult BMD Date       Age      Classification T-score AP Spine L1-L2 11/08/2017 72.7 Osteopenia -2.0 0.934 g/cm2 AP Spine L1-L2 11/05/2015 70.7 Osteoporosis -2.9 0.818 g/cm2 AP Spine L1-L2 01/25/2008 [age] Osteopenia -2.3 0.899 g/cm2 DualFemur Neck Left 11/08/2017 72.7 Osteopenia -1.8 0.793 g/cm2 DualFemur Neck Left 11/05/2015 70.7 Osteopenia -1.9 0.779 g/cm2 DualFemur Neck Left 01/25/2008 62.9 Osteopenia -1.7 0.804 g/cm2 DualFemur Total Mean 11/08/2017 72.7 Normal -1.0 0.882 g/cm2 DualFemur Total Mean 11/05/2015 70.7  Normal -1.0 0.888 g/cm2 DualFemur Total Mean 01/25/2008 62.9 Normal -0.7 0.917 g/cm2 World Health Organization [facility]) criteria for post-menopausal, Caucasian Women: Normal:       T-score at or above -1 SD Osteopenia:   T-score between -1 and -2.5 SD Osteoporosis: T-score at or below -2.5 SD RECOMMENDATIONS: 1. All patients should optimize calcium and vitamin D intake. 2. Consider FDA-approved medical therapies in postmenopausal women and men aged 54 years and older, based on the following: a. A hip or vertebral(clinical or morphometric) fracture b. T-score < -2.5 at the femoral neck or spine after appropriate evaluation to exclude secondary causes c. Low bone mass (T-score between -1.0 and -2.5 at the femoral neck or spine) and a 10-year probability of a hip fracture > 3% or a 10-year probability of a major osteoporosis-related fracture > 20% based on the [place]-adapted WHO algorithm d. Clinician judgment and/or patient preferences may indicate treatment for people with 10-year fracture probabilities above or below these levels FOLLOW-UP: People with diagnosed cases of osteoporosis or at high risk for fracture should have regular bone mineral density tests. For patients eligible for Medicare, routine testing is allowed once every 2 years. The testing frequency can be increased to one year for patients who have rapidly progressing disease, those who are receiving or discontinuing medical therapy to restore bone mass, or have additional risk factors. I have reviewed this report, and agree with the above findings. [facility] Electronically Signed   By: Lowella Grip III M.D.   On: 11/08/2017 10:53    Assessment and Plan:   MAAHI LANNAN is a 72 y.o. 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  MCV is normal.  Normal platelets. Liver enzymes are normal   can consider right upper quadrant ultrasound if EGD is negative and symptoms are ongoing  Colonoscopy up-to-date, repeat recommended in 10 years from 2017  Dr Bianca Shaw

## 2017-11-15 NOTE — Telephone Encounter (Signed)
FYI

## 2017-11-16 ENCOUNTER — Inpatient Hospital Stay: Payer: PPO

## 2017-11-16 ENCOUNTER — Other Ambulatory Visit: Payer: Self-pay

## 2017-11-16 DIAGNOSIS — C50412 Malignant neoplasm of upper-outer quadrant of left female breast: Secondary | ICD-10-CM

## 2017-11-16 DIAGNOSIS — D631 Anemia in chronic kidney disease: Secondary | ICD-10-CM | POA: Diagnosis not present

## 2017-11-16 LAB — CBC WITH DIFFERENTIAL/PLATELET
Basophils Absolute: 0.1 10*3/uL (ref 0–0.1)
Basophils Relative: 1 %
Eosinophils Absolute: 0.3 10*3/uL (ref 0–0.7)
Eosinophils Relative: 5 %
HCT: 29.4 % — ABNORMAL LOW (ref 35.0–47.0)
Hemoglobin: 10 g/dL — ABNORMAL LOW (ref 12.0–16.0)
Lymphocytes Relative: 10 %
Lymphs Abs: 0.8 10*3/uL — ABNORMAL LOW (ref 1.0–3.6)
MCH: 31.8 pg (ref 26.0–34.0)
MCHC: 34 g/dL (ref 32.0–36.0)
MCV: 93.4 fL (ref 80.0–100.0)
Monocytes Absolute: 1.2 10*3/uL — ABNORMAL HIGH (ref 0.2–0.9)
Monocytes Relative: 15 %
Neutro Abs: 5.3 10*3/uL (ref 1.4–6.5)
Neutrophils Relative %: 69 %
Platelets: 375 10*3/uL (ref 150–440)
RBC: 3.14 MIL/uL — ABNORMAL LOW (ref 3.80–5.20)
RDW: 13.6 % (ref 11.5–14.5)
WBC: 7.7 10*3/uL (ref 3.6–11.0)

## 2017-11-17 ENCOUNTER — Telehealth: Payer: Self-pay | Admitting: Cardiovascular Disease

## 2017-11-17 NOTE — Telephone Encounter (Signed)
I had called Dr. Rockey Situ office at 2:35 today to check on cardiac clearance and this was sent high priority by his office personal, of which was seen in his inbox. I called again at 4:10 and they could not locate his nurse (with pts) and will have her call. They called back and did not see the request at all that was sent on 11/15/17. So I faxed over the form for his signature for approval or not approved.

## 2017-11-17 NOTE — Telephone Encounter (Signed)
° °  [facility] Pre-operative Risk Assessment    Request for surgical clearance:  1. What type of surgery is being performed? EGD   2. When is this surgery scheduled? 11/18/17   3. What type of clearance is required (medical clearance vs. Pharmacy clearance to hold med vs. Both)?   4. Are there any medications that need to be held prior to surgery and how long?    5. Practice name and name of physician performing surgery? [facility]   6. What is your office phone number [phone]    7.   What is your office fax number [phone]  8.   Anesthesia type (None, local, MAC, general) ? General    Ace Gins 11/17/2017, 4:49 PM  _________________________________________________________________   (provider comments below)

## 2017-11-17 NOTE — Telephone Encounter (Signed)
Debbie from GI calling to check status of clearance for tomorrow morning Please call to discuss

## 2017-11-17 NOTE — Progress Notes (Signed)
[facility] calling to get an update on this  Procedure is tomorrow morning

## 2017-11-17 NOTE — Telephone Encounter (Signed)
We did not receive a request for clearance and her surgery is tomorrow. Will route to provider for review.

## 2017-11-18 ENCOUNTER — Ambulatory Visit: Payer: PPO | Admitting: Anesthesiology

## 2017-11-18 ENCOUNTER — Ambulatory Visit
Admission: RE | Admit: 2017-11-18 | Discharge: 2017-11-18 | Disposition: A | Discharge status: Home / Self Care | Payer: PPO | Source: Ambulatory Visit | Attending: Gastroenterology | Admitting: Gastroenterology

## 2017-11-18 ENCOUNTER — Encounter: Payer: Self-pay | Admitting: Certified Registered Nurse Anesthetist

## 2017-11-18 ENCOUNTER — Other Ambulatory Visit: Payer: Self-pay

## 2017-11-18 ENCOUNTER — Encounter
Admission: RE | Disposition: A | Discharge status: Home / Self Care | Payer: Self-pay | Source: Ambulatory Visit | Attending: Gastroenterology

## 2017-11-18 DIAGNOSIS — R112 Nausea with vomiting, unspecified: Secondary | ICD-10-CM

## 2017-11-18 DIAGNOSIS — K3189 Other diseases of stomach and duodenum: Secondary | ICD-10-CM | POA: Diagnosis not present

## 2017-11-18 DIAGNOSIS — J449 Chronic obstructive pulmonary disease, unspecified: Secondary | ICD-10-CM | POA: Diagnosis not present

## 2017-11-18 DIAGNOSIS — Z853 Personal history of malignant neoplasm of breast: Secondary | ICD-10-CM | POA: Diagnosis not present

## 2017-11-18 DIAGNOSIS — Z79899 Other long term (current) drug therapy: Secondary | ICD-10-CM | POA: Insufficient documentation

## 2017-11-18 DIAGNOSIS — F419 Anxiety disorder, unspecified: Secondary | ICD-10-CM | POA: Diagnosis not present

## 2017-11-18 DIAGNOSIS — Z87440 Personal history of urinary (tract) infections: Secondary | ICD-10-CM | POA: Diagnosis not present

## 2017-11-18 DIAGNOSIS — R109 Unspecified abdominal pain: Secondary | ICD-10-CM | POA: Diagnosis not present

## 2017-11-18 DIAGNOSIS — B9681 Helicobacter pylori [H. pylori] as the cause of diseases classified elsewhere: Secondary | ICD-10-CM | POA: Insufficient documentation

## 2017-11-18 DIAGNOSIS — K219 Gastro-esophageal reflux disease without esophagitis: Secondary | ICD-10-CM | POA: Insufficient documentation

## 2017-11-18 DIAGNOSIS — K253 Acute gastric ulcer without hemorrhage or perforation: Secondary | ICD-10-CM

## 2017-11-18 DIAGNOSIS — Z87891 Personal history of nicotine dependence: Secondary | ICD-10-CM | POA: Insufficient documentation

## 2017-11-18 DIAGNOSIS — N189 Chronic kidney disease, unspecified: Secondary | ICD-10-CM | POA: Diagnosis not present

## 2017-11-18 DIAGNOSIS — Z7982 Long term (current) use of aspirin: Secondary | ICD-10-CM | POA: Diagnosis not present

## 2017-11-18 DIAGNOSIS — K259 Gastric ulcer, unspecified as acute or chronic, without hemorrhage or perforation: Secondary | ICD-10-CM | POA: Insufficient documentation

## 2017-11-18 DIAGNOSIS — I10 Essential (primary) hypertension: Secondary | ICD-10-CM | POA: Insufficient documentation

## 2017-11-18 DIAGNOSIS — R1013 Epigastric pain: Secondary | ICD-10-CM | POA: Diagnosis not present

## 2017-11-18 DIAGNOSIS — R1084 Generalized abdominal pain: Secondary | ICD-10-CM

## 2017-11-18 DIAGNOSIS — K2960 Other gastritis without bleeding: Secondary | ICD-10-CM | POA: Diagnosis not present

## 2017-11-18 DIAGNOSIS — I25110 Atherosclerotic heart disease of native coronary artery with unstable angina pectoris: Secondary | ICD-10-CM | POA: Diagnosis not present

## 2017-11-18 DIAGNOSIS — K2950 Unspecified chronic gastritis without bleeding: Secondary | ICD-10-CM | POA: Insufficient documentation

## 2017-11-18 DIAGNOSIS — I129 Hypertensive chronic kidney disease with stage 1 through stage 4 chronic kidney disease, or unspecified chronic kidney disease: Secondary | ICD-10-CM | POA: Diagnosis not present

## 2017-11-18 DIAGNOSIS — D631 Anemia in chronic kidney disease: Secondary | ICD-10-CM | POA: Diagnosis not present

## 2017-11-18 HISTORY — PX: ESOPHAGOGASTRODUODENOSCOPY (EGD) WITH PROPOFOL: SHX5813

## 2017-11-18 SURGERY — ESOPHAGOGASTRODUODENOSCOPY (EGD) WITH PROPOFOL
Anesthesia: General

## 2017-11-18 MED ORDER — LIDOCAINE HCL (CARDIAC) PF 100 MG/5ML IV SOSY
PREFILLED_SYRINGE | INTRAVENOUS | Status: DC | PRN
  Administered 2017-11-18: 50 mg via INTRAVENOUS

## 2017-11-18 MED ORDER — LIDOCAINE HCL (PF) 1 % IJ SOLN
INTRAMUSCULAR | Status: AC
  Administered 2017-11-18: 0.3 mL
  Filled 2017-11-18: qty 2

## 2017-11-18 MED ORDER — PROPOFOL 500 MG/50ML IV EMUL
INTRAVENOUS | Status: AC
  Filled 2017-11-18: qty 50

## 2017-11-18 MED ORDER — IPRATROPIUM-ALBUTEROL 0.5-2.5 (3) MG/3ML IN SOLN
RESPIRATORY_TRACT | Status: AC
  Filled 2017-11-18: qty 3

## 2017-11-18 MED ORDER — PROPOFOL 10 MG/ML IV BOLUS
INTRAVENOUS | Status: DC | PRN
  Administered 2017-11-18: 100 mg via INTRAVENOUS
  Administered 2017-11-18: 18 mg via INTRAVENOUS

## 2017-11-18 MED ORDER — PROPOFOL 500 MG/50ML IV EMUL
INTRAVENOUS | Status: DC | PRN
  Administered 2017-11-18: 140 ug/kg/min via INTRAVENOUS

## 2017-11-18 MED ORDER — PANTOPRAZOLE SODIUM 40 MG PO TBEC: 40.0000 mg | DELAYED_RELEASE_TABLET | Freq: Two times a day (BID) | ORAL | 1 refills | Status: DC

## 2017-11-18 MED ORDER — IPRATROPIUM-ALBUTEROL 0.5-2.5 (3) MG/3ML IN SOLN
3.0000 mL | Freq: Once | RESPIRATORY_TRACT | Status: AC
  Administered 2017-11-18: 3 mL via RESPIRATORY_TRACT

## 2017-11-18 MED ORDER — SODIUM CHLORIDE 0.9 % IV SOLN
INTRAVENOUS | Status: DC
  Administered 2017-11-18: 1000 mL via INTRAVENOUS

## 2017-11-18 MED ORDER — LIDOCAINE HCL (PF) 2 % IJ SOLN
INTRAMUSCULAR | Status: AC
  Filled 2017-11-18: qty 10

## 2017-11-18 NOTE — H&P (Signed)
Vonda Antigua, MD [address] [place], [place], [place], [address] [address], [place], [place], [place], [address] Phone: [phone]  Fax: [phone]  Primary Care Physician:  Sofie Hartigan, MD   Pre-Procedure History & Physical: HPI:  Bianca Shaw is a 72 y.o. female is here for an EGD.   Past Medical History:  Diagnosis Date  . Anemia   . Anxiety disorder   . Breast cancer of upper-outer quadrant of left female breast ([facility]) 11/2015   pT2 pN0(i+).;ER+; PR +, her 2 neu not overexpressed.  Mastectomy, SLN, Mammoprint: Low risk.   . Cancer ([facility]) 12/03/2015   left breast/ INVASIVE LOBULAR CARCINOMA.   . Cough    lingering, mild, finished Prednisone and anitbiotic 11/03/15  . Family history of adverse reaction to anesthesia    sister - PONV  . GERD (gastroesophageal reflux disease)   . Hypertension   . Osteopenia   . Osteoporosis   . Pericarditis    diagnonsed June, 2010, unclear etiology as of yer  . Personal history of tobacco use, presenting hazards to health 10/31/2015  . Scleroderma ([facility])    ONLY ON SKIN-MILD  . UTI (lower urinary tract infection)   . Wears dentures    full upper    Past Surgical History:  Procedure Laterality Date  . BREAST BIOPSY Right 2012   core - neg  . BREAST BIOPSY Left 12/03/2015   INVASIVE LOBULAR CARCINOMA.   Marland Kitchen CATARACT EXTRACTION W/ INTRAOCULAR LENS IMPLANT Right   . COLONOSCOPY WITH PROPOFOL N/A 11/08/2015   Procedure: COLONOSCOPY WITH PROPOFOL;  Surgeon: Lucilla Lame, MD;  Location: [facility];  Service: Endoscopy;  Laterality: N/A;  . EVACUATION BREAST HEMATOMA Left 01/14/2016   Procedure: EVACUATION HEMATOMA BREAST;  Surgeon: Robert Bellow, MD;  Location: [facility];  Service: General;  Laterality: Left;  Marland Kitchen MASTECTOMY Left 2017   complete mastectomy  . MASTECTOMY W/ SENTINEL NODE BIOPSY Left 12/26/2015   Procedure: MASTECTOMY WITH SENTINEL LYMPH NODE BIOPSY;  Surgeon: Robert Bellow, MD;  Location:  [facility];  Service: General;  Laterality: Left;  . RIGHT/LEFT HEART CATH AND CORONARY ANGIOGRAPHY N/A 07/22/2016   Procedure: Right/Left Heart Cath and Coronary Angiography;  Surgeon: Minna Merritts, MD;  Location: [facility];  Service: Cardiovascular;  Laterality: N/A;  . TUBAL LIGATION    . VESICOVAGINAL FISTULA CLOSURE W/ TAH      Prior to Admission medications   Medication Sig Start Date End Date Taking? Authorizing Provider  albuterol (PROVENTIL) (2.5 MG/3ML) 0.083% nebulizer solution Take 3 mLs (2.5 mg total) by nebulization every 6 (six) hours as needed for wheezing or shortness of breath. 06/06/15  Yes Mar Daring, PA-C  aspirin EC 81 MG tablet Take 81 mg by mouth daily.   Yes [provider]  diazepam (VALIUM) 5 MG tablet  11/08/17  Yes [provider]  fluticasone (FLONASE) 50 MCG/ACT nasal spray Place 2 sprays into the nose daily as needed for allergies or rhinitis.    Yes [provider]  furosemide (LASIX) 20 MG tablet  11/08/17  Yes [provider]  hydrALAZINE (APRESOLINE) 25 MG tablet Take 1 tablet (25 mg total) by mouth 2 (two) times daily. 04/29/17  Yes Jerrol Banana., MD  lisinopril (PRINIVIL,ZESTRIL) 20 MG tablet Take 1 tablet (20 mg total) by mouth daily. 06/26/17  Yes Gladstone Lighter, MD  metoprolol succinate (TOPROL-XL) 25 MG 24 hr tablet Take 1 tablet (25 mg total) by  mouth daily. 05/24/17  Yes Jerrol Banana., MD  nitroGLYCERIN (NITROSTAT) 0.4 MG SL tablet Place 1 tablet (0.4 mg total) under the tongue every 5 (five) minutes as needed for chest pain. 07/17/16  Yes Mody, Ulice Bold, MD  ondansetron (ZOFRAN) 4 MG tablet Take 1 tablet (4 mg total) by mouth every 8 (eight) hours as needed for nausea or vomiting. 06/02/17  Yes Mar Daring, PA-C  pantoprazole (PROTONIX) 40 MG tablet  11/08/17  Yes [provider]  potassium chloride SA (K-DUR,KLOR-CON) 20 MEQ tablet Take 1 tablet (20 mEq total) by mouth  daily. 06/12/17  Yes Vaughan Basta, MD  simvastatin (ZOCOR) 40 MG tablet Take 1 tablet (40 mg total) by mouth daily. 11/02/17  Yes Gollan, Kathlene November, MD  sodium chloride 1 g tablet Take 1 g by mouth 2 (two) times daily with a meal.    Yes [provider]  tamoxifen (NOLVADEX) 20 MG tablet Take 1 tablet (20 mg total) by mouth daily. 08/02/17  Yes Lequita Asal, MD    Allergies as of 11/15/2017 - Review Complete 11/15/2017  Allergen Reaction Noted  . Pimenta Nausea And Vomiting 11/04/2015  . Tomato  06/22/2017    Family History  Problem Relation Age of Onset  . Heart failure Mother   . Epilepsy Mother   . COPD Father   . Heart disease Father   . Anxiety disorder Sister   . Arthritis Brother   . Heart disease Brother   . Vaginal cancer Paternal Grandmother   . Heart attack Paternal Grandfather   . COPD Brother   . Kidney failure Brother   . COPD Brother   . Arthritis Sister   . Uterine cancer Unknown   . Diabetes Unknown   . Colon cancer Neg Hx   . Stomach cancer Neg Hx   . Breast cancer Neg Hx     Social History   Socioeconomic History  . Marital status: Married    Spouse name: Not on file  . Number of children: 2  . Years of education: Not on file  . Highest education level: Not on file  Occupational History  . Not on file  Social Needs  . Financial resource strain: Not on file  . Food insecurity:    Worry: Not on file    Inability: Not on file  . Transportation needs:    Medical: Not on file    Non-medical: Not on file  Tobacco Use  . Smoking status: Former Smoker    Packs/day: 0.75    Years: 40.00    Pack years: 30.00    Types: Cigarettes    Last attempt to quit: 11/07/2003    Years since quitting: 14.0  . Smokeless tobacco: Never Used  Substance and Sexual Activity  . Alcohol use: Yes    Alcohol/week: 0.6 - 1.8 oz    Types: 1 - 3 Glasses of wine per week    Comment: Occasionally.    . Drug use: No  . Sexual activity: Not on file    Lifestyle  . Physical activity:    Days per week: Not on file    Minutes per session: Not on file  . Stress: Not on file  Relationships  . Social connections:    Talks on phone: Not on file    Gets together: Not on file    Attends religious service: Not on file    Active member of club or organization: Not on file  Attends meetings of clubs or organizations: Not on file    Relationship status: Not on file  . Intimate partner violence:    Fear of current or ex partner: Not on file    Emotionally abused: Not on file    Physically abused: Not on file    Forced sexual activity: Not on file  Other Topics Concern  . Not on file  Social History Narrative   She drinks 1 to 2 caffeinated beverages a day    Review of Systems: See HPI, otherwise negative ROS  Physical Exam: BP 139/61   Pulse 60   Temp (!) 96.9 F (36.1 C) (Tympanic)   Resp 18   Ht 5' (1.524 m)   Wt 135 lb (61.2 kg)   SpO2 100%   BMI 26.37 kg/m  General:   Alert,  pleasant and cooperative in NAD Head:  Normocephalic and atraumatic. Neck:  Supple; no masses or thyromegaly. Lungs:  Clear throughout to auscultation, normal respiratory effort.    Heart:  +S1, +S2, Regular rate and rhythm, No edema. Abdomen:  Soft, nontender and nondistended. Normal bowel sounds, without guarding, and without rebound.   Neurologic:  Alert and  oriented x4;  grossly normal neurologically.  Impression/Plan: Bianca Shaw is here for an EGD for Nausea/vomiting  Risks, benefits, limitations, and alternatives regarding the procedure have been reviewed with the patient.  Questions have been answered.  All parties agreeable.   Virgel Manifold, MD  11/18/2017, 9:37 AM

## 2017-11-18 NOTE — Anesthesia Preprocedure Evaluation (Signed)
Anesthesia Evaluation  Patient identified by MRN, date of birth, ID band Patient awake    Reviewed: Allergy & Precautions, H&P , NPO status , Patient's Chart, lab work & pertinent test results  History of Anesthesia Complications (+) Family history of anesthesia reaction and history of anesthetic complications  Airway Mallampati: III  TM Distance: <3 FB Neck ROM: limited    Dental  (+) Chipped, Poor Dentition, Missing, Upper Dentures   Pulmonary shortness of breath and with exertion, COPD, former smoker,           Cardiovascular Exercise Tolerance: Good hypertension, (-) angina+ CAD and + Past MI       Neuro/Psych PSYCHIATRIC DISORDERS Anxiety negative neurological ROS  negative psych ROS   GI/Hepatic Neg liver ROS, GERD  Medicated and Controlled,  Endo/Other  negative endocrine ROS  Renal/GU Renal disease  negative genitourinary   Musculoskeletal   Abdominal   Peds  Hematology negative hematology ROS (+)   Anesthesia Other Findings Past Medical History: No date: Anemia No date: Anxiety disorder 11/2015: Breast cancer of upper-outer quadrant of left female breast  ([facility])     Comment:  pT2 pN0(i+).;ER+; PR +, her 2 neu not overexpressed.                Mastectomy, SLN, Mammoprint: Low risk.  12/03/2015: Cancer ([facility])     Comment:  left breast/ INVASIVE LOBULAR CARCINOMA.  No date: Cough     Comment:  lingering, mild, finished Prednisone and anitbiotic               11/03/15 No date: Family history of adverse reaction to anesthesia     Comment:  sister - PONV No date: GERD (gastroesophageal reflux disease) No date: Hypertension No date: Osteopenia No date: Osteoporosis No date: Pericarditis     Comment:  diagnonsed June, 2010, unclear etiology as of yer 10/31/2015: Personal history of tobacco use, presenting hazards to  health No date: Scleroderma ([facility])     Comment:  ONLY ON SKIN-MILD No date: UTI (lower  urinary tract infection) No date: Wears dentures     Comment:  full upper  Past Surgical History: 2012: BREAST BIOPSY; Right     Comment:  core - neg 12/03/2015: BREAST BIOPSY; Left     Comment:  INVASIVE LOBULAR CARCINOMA.  No date: CATARACT EXTRACTION W/ INTRAOCULAR LENS IMPLANT; Right 11/08/2015: COLONOSCOPY WITH PROPOFOL; N/A     Comment:  Procedure: COLONOSCOPY WITH PROPOFOL;  Surgeon: Lucilla Lame, MD;  Location: [facility];  Service:               Endoscopy;  Laterality: N/A; 01/14/2016: EVACUATION BREAST HEMATOMA; Left     Comment:  Procedure: EVACUATION HEMATOMA BREAST;  Surgeon: Robert Bellow, MD;  Location: [facility];  Service: General;                Laterality: Left; 2017: MASTECTOMY; Left     Comment:  complete mastectomy 12/26/2015: MASTECTOMY W/ SENTINEL NODE BIOPSY; Left     Comment:  Procedure: MASTECTOMY WITH SENTINEL LYMPH NODE BIOPSY;                Surgeon: Robert Bellow, MD;  Location: [facility];                Service: General;  Laterality: Left; 07/22/2016: RIGHT/LEFT  HEART CATH AND CORONARY ANGIOGRAPHY; N/A     Comment:  Procedure: Right/Left Heart Cath and Coronary               Angiography;  Surgeon: Minna Merritts, MD;  Location:               [facility];  Service: Cardiovascular;                Laterality: N/A; No date: TUBAL LIGATION No date: VESICOVAGINAL FISTULA CLOSURE W/ TAH  BMI    Body Mass Index:  26.37 kg/m      Reproductive/Obstetrics negative OB ROS                             Anesthesia Physical Anesthesia Plan  ASA: III  Anesthesia Plan: General   Post-op Pain Management:    Induction: Intravenous  PONV Risk Score and Plan: Propofol infusion and TIVA  Airway Management Planned: Natural Airway and Nasal Cannula  Additional Equipment:   Intra-op Plan:   Post-operative Plan:   Informed Consent: I have reviewed the patients History and Physical,  chart, labs and discussed the procedure including the risks, benefits and alternatives for the proposed anesthesia with the patient or authorized representative who has indicated his/her understanding and acceptance.   Dental Advisory Given  Plan Discussed with: Anesthesiologist, CRNA and Surgeon  Anesthesia Plan Comments: (Patient consented for risks of anesthesia including but not limited to:  - adverse reactions to medications - risk of intubation if required - damage to teeth, lips or other oral mucosa - sore throat or hoarseness - Damage to heart, brain, lungs or loss of life  Patient voiced understanding.)        Anesthesia Quick Evaluation

## 2017-11-18 NOTE — Op Note (Signed)
[facility] Gastroenterology Patient Name: Bianca Shaw Procedure Date: 11/18/2017 9:42 AM MRN: [id number] Account #: [id number] Date of Birth: Dec 27, 1945 Admit Type: Outpatient Age: 72 Room: [facility] ENDO ROOM 4 Gender: Female Note Status: Finalized Procedure:            Upper GI endoscopy Indications:          Epigastric abdominal pain, Nausea with vomiting Providers:            Devina Bezold B. Bonna Gains MD, MD Referring MD:         Sofie Hartigan (Referring MD) Medicines:            Monitored Anesthesia Care Complications:        No immediate complications. Procedure:            Pre-Anesthesia Assessment:                       - Prior to the procedure, a History and Physical was                        performed, and patient medications, allergies and                        sensitivities were reviewed. The patient's tolerance of                        previous anesthesia was reviewed.                       - The risks and benefits of the procedure and the                        sedation options and risks were discussed with the                        patient. All questions were answered and informed                        consent was obtained.                       - Patient identification and proposed procedure were                        verified prior to the procedure by the physician, the                        nurse, the anesthesiologist, the anesthetist and the                        technician. The procedure was verified in the procedure                        room.                       - ASA Grade Assessment: III - A patient with severe                        systemic disease.  After obtaining informed consent, the endoscope was                        passed under direct vision. Throughout the procedure,                        the patient's blood pressure, pulse, and oxygen                        saturations were monitored  continuously. The Endoscope                        was introduced through the mouth, and advanced to the                        second part of duodenum. The upper GI endoscopy was                        accomplished with ease. The patient tolerated the                        procedure well. Findings:      The examined esophagus was normal.      Patchy mildly erythematous mucosa without bleeding was found in the       gastric antrum. Biopsies were taken with a cold forceps for histology.       Biopsies were obtained in the gastric body, at the incisura and in the       gastric antrum with cold forceps for histology.      Many 3-4 non-obstructing non-bleeding superficial gastric ulcers, one       with pigmented material heaped edges, were found in the gastric body and       in the gastric antrum. The largest lesion was 8 mm in largest dimension.       Biopsies were taken with a cold forceps for histology.      A small amount of food (residue) was found in the gastric fundus.      A small hiatal hernia was present.      Patchy mildly congested mucosa without active bleeding and with no       stigmata of bleeding was found in the duodenal bulb.      Localized mildly scalloped mucosa was found in the second portion of the       duodenum. Biopsies were taken with a cold forceps for histology.      There is no endoscopic evidence of stenosis or stricture in the pylorus. Impression:           - Normal esophagus.                       - Erythematous mucosa in the antrum. Biopsied.                       - Non-obstructing non-bleeding gastric ulcers, one with                        pigmented material and heaped edges. Biopsied. The                        heaped edges of the ulcer with the pigmented spot, were  biopsied to rule out malignancy. The center of the                        ulcer was not biopsied to prevent bleeding. Mild oozing                        of the ulcer  edges were seen when washed with water.                        The oozing spontaneously resolved and hemostasis was                        not necessary, and risks of hemostasis in this setting                        would outweigh benefits.                       - Congested duodenal mucosa.                       - Scalloped mucosa was found in the duodenum, rule out                        celiac disease. Biopsied.                       - Biopsies were obtained in the gastric body, at the                        incisura and in the gastric antrum. Recommendation:       - Await pathology results.                       - Discharge patient to home (with escort).                       - Advance diet as tolerated.                       - Continue present medications.                       - Patient has a contact number available for                        emergencies. The signs and symptoms of potential                        delayed complications were discussed with the patient.                        Return to normal activities tomorrow. Written discharge                        instructions were provided to the patient.                       - Discharge patient to home (with escort).                       -  The findings and recommendations were discussed with                        the patient.                       - The findings and recommendations were discussed with                        the patient's family.                       - Use Protonix (pantoprazole) 40 mg PO BID.                       - Return to GI clinic in 2 weeks. Procedure Code(s):    --- Professional ---                       [phone], Esophagogastroduodenoscopy, flexible, transoral;                        with biopsy, single or multiple Diagnosis Code(s):    --- Professional ---                       K31.89, Other diseases of stomach and duodenum                       K25.9, Gastric ulcer, unspecified as acute or  chronic,                        without hemorrhage or perforation                       R10.13, Epigastric pain                       R11.2, Nausea with vomiting, unspecified CPT copyright 2017 American Medical Association. All rights reserved. The codes documented in this report are preliminary and upon coder review may  be revised to meet current compliance requirements.  Vonda Antigua, MD Margretta Sidle B. Bonna Gains MD, MD 11/18/2017 10:30:33 AM This report has been signed electronically. Number of Addenda: 0 Note Initiated On: 11/18/2017 9:42 AM Estimated Blood Loss: Estimated blood loss: none.      [facility]

## 2017-11-18 NOTE — Anesthesia Post-op Follow-up Note (Signed)
Anesthesia QCDR form completed.        

## 2017-11-18 NOTE — Transfer of Care (Signed)
Immediate Anesthesia Transfer of Care Note  Patient: Bianca Shaw  Procedure(s) Performed: ESOPHAGOGASTRODUODENOSCOPY (EGD) WITH PROPOFOL (N/A )  Patient Location: PACU and Endoscopy Unit  Anesthesia Type:General  Level of Consciousness: drowsy  Airway & Oxygen Therapy: Patient Spontanous Breathing and Patient connected to nasal cannula oxygen  Post-op Assessment: Report given to RN and Post -op Vital signs reviewed and stable  Post vital signs: Reviewed and stable  Last Vitals:  Vitals Value Taken Time  BP 116/47 11/18/2017 10:17 AM  Temp    Pulse 67 11/18/2017 10:17 AM  Resp 28 11/18/2017 10:17 AM  SpO2 99 % 11/18/2017 10:17 AM  Vitals shown include unvalidated device data.  Last Pain:  Vitals:   11/18/17 0900  TempSrc: Tympanic         Complications: No apparent anesthesia complications

## 2017-11-18 NOTE — Anesthesia Postprocedure Evaluation (Signed)
Anesthesia Post Note  Patient: Bianca Shaw  Procedure(s) Performed: ESOPHAGOGASTRODUODENOSCOPY (EGD) WITH PROPOFOL (N/A )  Patient location during evaluation: Endoscopy Anesthesia Type: General Level of consciousness: awake and alert Pain management: pain level controlled Vital Signs Assessment: post-procedure vital signs reviewed and stable Respiratory status: spontaneous breathing, nonlabored ventilation, respiratory function stable and patient connected to nasal cannula oxygen Cardiovascular status: blood pressure returned to baseline and stable Postop Assessment: no apparent nausea or vomiting Anesthetic complications: no     Last Vitals:  Vitals:   11/18/17 1030 11/18/17 1040  BP: 123/60 132/67  Pulse: 63 60  Resp: 19 15  Temp:    SpO2: 100% 100%    Last Pain:  Vitals:   11/18/17 1010  TempSrc: Tympanic                 Precious Haws Piscitello

## 2017-11-19 ENCOUNTER — Telehealth: Payer: Self-pay | Admitting: Gastroenterology

## 2017-11-19 NOTE — Telephone Encounter (Signed)
Advised pt's husband we are still waiting on the results of her h pylori test. Once this has been received we will contact pt with appropriate treatment.

## 2017-11-19 NOTE — Telephone Encounter (Signed)
Pt husband left vm in regards to pt  She had procedure yesterday and he saw on mychart  That pt has Bacterial infection/ ulcer in stomach he wants to know if she can get rx for this please call

## 2017-11-21 NOTE — Telephone Encounter (Signed)
Procedure already performed We received consent request day before and was not flagged

## 2017-11-22 ENCOUNTER — Encounter: Payer: Self-pay | Admitting: Gastroenterology

## 2017-11-22 LAB — SURGICAL PATHOLOGY

## 2017-11-22 NOTE — Telephone Encounter (Signed)
Clearance faxed to number provided.

## 2017-11-24 ENCOUNTER — Other Ambulatory Visit: Payer: Self-pay | Admitting: Gastroenterology

## 2017-11-24 MED ORDER — AMOXICILLIN 500 MG PO TABS: 1000.0000 mg | ORAL_TABLET | Freq: Two times a day (BID) | ORAL | 0 refills | Status: AC

## 2017-11-24 MED ORDER — CLARITHROMYCIN 250 MG PO TABS: 500.0000 mg | ORAL_TABLET | Freq: Two times a day (BID) | ORAL | 0 refills | Status: AC

## 2017-11-24 NOTE — Progress Notes (Signed)
Patient's immunostain was positive for H. pylori.  I have sent amoxicillin and clarithromycin to her pharmacy.  The clarithromycin interacts with her simvastatin.  I have messaged Dr. Rockey Situ for review, and if it is okay to hold her simvastatin for the 14 days clarithromycin as needed.  If not, can consider alternative therapy.

## 2017-11-25 NOTE — Progress Notes (Signed)
Minna Merritts, MD  Virgel Manifold, MD        Absolutely fine to hold the medication  Thanks  TG   Previous Messages    ----- Message -----  From: Virgel Manifold, MD  Sent: 11/24/2017  9:35 AM  To: Minna Merritts, MD   Hi Dr. Rockey Situ,  I wanted to ask if it is okay with you if the patient's simvastatin is held for 14 days. She needs clarithromycin as triple therapy for H. pylori infection, and clarithromycin interacts with her simvastatin. Or would you recommend decreasing the dose instead? Thank you

## 2017-11-29 ENCOUNTER — Telehealth: Payer: Self-pay

## 2017-11-29 NOTE — Telephone Encounter (Signed)
I spoke with pt and as he told me before they were stopping the simvastin and taking ATB's and protonix. He states pt is doing a lot better. No n/v.  Do they need to make a f/u appt? They are to call office when treatment is complete and let us know how she is doing.

## 2017-11-29 NOTE — Telephone Encounter (Signed)
-----   Message from Virgel Manifold, MD sent at 11/24/2017  2:26 PM EDT ----- Jackelyn Poling, Please let patient know Dr. Rockey Situ has approved holding the simvastatin. Hold for 14 days and take the 2 antibiotics and Protonix already prescribed. Please document the bellow notes in patient's chart as well. Thank you

## 2017-11-30 ENCOUNTER — Inpatient Hospital Stay: Payer: PPO | Attending: Hematology and Oncology

## 2017-11-30 ENCOUNTER — Inpatient Hospital Stay: Payer: PPO

## 2017-11-30 VITALS — BP 155/73 | HR 65

## 2017-11-30 DIAGNOSIS — Z87891 Personal history of nicotine dependence: Secondary | ICD-10-CM | POA: Insufficient documentation

## 2017-11-30 DIAGNOSIS — Z170 Estrogen receptor positive status [ER+]: Secondary | ICD-10-CM | POA: Insufficient documentation

## 2017-11-30 DIAGNOSIS — M810 Age-related osteoporosis without current pathological fracture: Secondary | ICD-10-CM | POA: Insufficient documentation

## 2017-11-30 DIAGNOSIS — D649 Anemia, unspecified: Secondary | ICD-10-CM | POA: Insufficient documentation

## 2017-11-30 DIAGNOSIS — E871 Hypo-osmolality and hyponatremia: Secondary | ICD-10-CM | POA: Insufficient documentation

## 2017-11-30 DIAGNOSIS — Z79899 Other long term (current) drug therapy: Secondary | ICD-10-CM | POA: Diagnosis not present

## 2017-11-30 DIAGNOSIS — C50412 Malignant neoplasm of upper-outer quadrant of left female breast: Secondary | ICD-10-CM | POA: Diagnosis not present

## 2017-11-30 DIAGNOSIS — N189 Chronic kidney disease, unspecified: Secondary | ICD-10-CM

## 2017-11-30 DIAGNOSIS — Z9012 Acquired absence of left breast and nipple: Secondary | ICD-10-CM | POA: Diagnosis not present

## 2017-11-30 DIAGNOSIS — D631 Anemia in chronic kidney disease: Principal | ICD-10-CM

## 2017-11-30 LAB — CBC WITH DIFFERENTIAL/PLATELET
Basophils Absolute: 0.1 10*3/uL (ref 0–0.1)
Basophils Relative: 1 %
Eosinophils Absolute: 0.3 10*3/uL (ref 0–0.7)
Eosinophils Relative: 6 %
HCT: 27.8 % — ABNORMAL LOW (ref 35.0–47.0)
Hemoglobin: 9.5 g/dL — ABNORMAL LOW (ref 12.0–16.0)
Lymphocytes Relative: 11 %
Lymphs Abs: 0.6 10*3/uL — ABNORMAL LOW (ref 1.0–3.6)
MCH: 31.8 pg (ref 26.0–34.0)
MCHC: 34 g/dL (ref 32.0–36.0)
MCV: 93.6 fL (ref 80.0–100.0)
Monocytes Absolute: 0.7 10*3/uL (ref 0.2–0.9)
Monocytes Relative: 13 %
Neutro Abs: 3.8 10*3/uL (ref 1.4–6.5)
Neutrophils Relative %: 69 %
Platelets: 304 10*3/uL (ref 150–440)
RBC: 2.97 MIL/uL — ABNORMAL LOW (ref 3.80–5.20)
RDW: 14.5 % (ref 11.5–14.5)
WBC: 5.5 10*3/uL (ref 3.6–11.0)

## 2017-11-30 MED ORDER — EPOETIN ALFA 10000 UNIT/ML IJ SOLN
10000.0000 [IU] | Freq: Once | INTRAMUSCULAR | Status: AC
  Administered 2017-11-30: 10000 [IU] via SUBCUTANEOUS
  Filled 2017-11-30: qty 1

## 2017-12-01 NOTE — Telephone Encounter (Signed)
Pt has been scheduled.  °

## 2017-12-09 DIAGNOSIS — I10 Essential (primary) hypertension: Secondary | ICD-10-CM | POA: Diagnosis not present

## 2017-12-09 DIAGNOSIS — E871 Hypo-osmolality and hyponatremia: Secondary | ICD-10-CM | POA: Diagnosis not present

## 2017-12-09 DIAGNOSIS — R809 Proteinuria, unspecified: Secondary | ICD-10-CM | POA: Diagnosis not present

## 2017-12-09 DIAGNOSIS — N183 Chronic kidney disease, stage 3 (moderate): Secondary | ICD-10-CM | POA: Diagnosis not present

## 2017-12-14 ENCOUNTER — Encounter: Payer: Self-pay | Admitting: Hematology and Oncology

## 2017-12-14 ENCOUNTER — Other Ambulatory Visit: Payer: Self-pay | Admitting: Hematology and Oncology

## 2017-12-14 ENCOUNTER — Inpatient Hospital Stay: Payer: PPO

## 2017-12-14 ENCOUNTER — Other Ambulatory Visit: Payer: Self-pay

## 2017-12-14 ENCOUNTER — Inpatient Hospital Stay (HOSPITAL_BASED_OUTPATIENT_CLINIC_OR_DEPARTMENT_OTHER): Payer: PPO | Admitting: Hematology and Oncology

## 2017-12-14 VITALS — BP 124/61 | HR 63 | Temp 96.9°F | Resp 18 | Wt 135.1 lb

## 2017-12-14 DIAGNOSIS — Z79899 Other long term (current) drug therapy: Secondary | ICD-10-CM | POA: Diagnosis not present

## 2017-12-14 DIAGNOSIS — N189 Chronic kidney disease, unspecified: Secondary | ICD-10-CM

## 2017-12-14 DIAGNOSIS — C50412 Malignant neoplasm of upper-outer quadrant of left female breast: Secondary | ICD-10-CM

## 2017-12-14 DIAGNOSIS — R110 Nausea: Secondary | ICD-10-CM

## 2017-12-14 DIAGNOSIS — Z170 Estrogen receptor positive status [ER+]: Secondary | ICD-10-CM | POA: Diagnosis not present

## 2017-12-14 DIAGNOSIS — D631 Anemia in chronic kidney disease: Principal | ICD-10-CM

## 2017-12-14 DIAGNOSIS — Z9012 Acquired absence of left breast and nipple: Secondary | ICD-10-CM

## 2017-12-14 DIAGNOSIS — E871 Hypo-osmolality and hyponatremia: Secondary | ICD-10-CM

## 2017-12-14 DIAGNOSIS — D649 Anemia, unspecified: Secondary | ICD-10-CM

## 2017-12-14 DIAGNOSIS — M810 Age-related osteoporosis without current pathological fracture: Secondary | ICD-10-CM

## 2017-12-14 DIAGNOSIS — E538 Deficiency of other specified B group vitamins: Secondary | ICD-10-CM

## 2017-12-14 LAB — CBC WITH DIFFERENTIAL/PLATELET
Basophils Absolute: 0.1 10*3/uL (ref 0–0.1)
Basophils Relative: 2 %
Eosinophils Absolute: 0.3 10*3/uL (ref 0–0.7)
Eosinophils Relative: 6 %
HCT: 28.5 % — ABNORMAL LOW (ref 35.0–47.0)
Hemoglobin: 9.6 g/dL — ABNORMAL LOW (ref 12.0–16.0)
Lymphocytes Relative: 11 %
Lymphs Abs: 0.5 10*3/uL — ABNORMAL LOW (ref 1.0–3.6)
MCH: 31.6 pg (ref 26.0–34.0)
MCHC: 33.6 g/dL (ref 32.0–36.0)
MCV: 93.9 fL (ref 80.0–100.0)
Monocytes Absolute: 0.8 10*3/uL (ref 0.2–0.9)
Monocytes Relative: 17 %
Neutro Abs: 3 10*3/uL (ref 1.4–6.5)
Neutrophils Relative %: 64 %
Platelets: 318 10*3/uL (ref 150–440)
RBC: 3.03 MIL/uL — ABNORMAL LOW (ref 3.80–5.20)
RDW: 15.2 % — ABNORMAL HIGH (ref 11.5–14.5)
WBC: 4.6 10*3/uL (ref 3.6–11.0)

## 2017-12-14 LAB — COMPREHENSIVE METABOLIC PANEL
ALT: 21 U/L (ref 0–44)
AST: 23 U/L (ref 15–41)
Albumin: 3.3 g/dL — ABNORMAL LOW (ref 3.5–5.0)
Alkaline Phosphatase: 78 U/L (ref 38–126)
Anion gap: 8 (ref 5–15)
BUN: 25 mg/dL — ABNORMAL HIGH (ref 8–23)
CO2: 25 mmol/L (ref 22–32)
Calcium: 9.6 mg/dL (ref 8.9–10.3)
Chloride: 98 mmol/L (ref 98–111)
Creatinine, Ser: 1.44 mg/dL — ABNORMAL HIGH (ref 0.44–1.00)
GFR calc Af Amer: 41 mL/min — ABNORMAL LOW (ref >60)
GFR calc non Af Amer: 35 mL/min — ABNORMAL LOW (ref >60)
Glucose, Bld: 100 mg/dL — ABNORMAL HIGH (ref 70–99)
Potassium: 3.4 mmol/L — ABNORMAL LOW (ref 3.5–5.1)
Sodium: 131 mmol/L — ABNORMAL LOW (ref 135–145)
Total Bilirubin: 0.6 mg/dL (ref 0.3–1.2)
Total Protein: 7.6 g/dL (ref 6.5–8.1)

## 2017-12-14 LAB — FERRITIN: Ferritin: 60 ng/mL (ref 11–307)

## 2017-12-14 MED ORDER — EPOETIN ALFA 20000 UNIT/ML IJ SOLN: 10000.0000 [IU] | Freq: Once | INTRAMUSCULAR | Status: DC

## 2017-12-14 MED ORDER — ONDANSETRON HCL 4 MG PO TABS: 4.0000 mg | ORAL_TABLET | Freq: Three times a day (TID) | ORAL | 0 refills | Status: DC | PRN

## 2017-12-14 MED ORDER — EPOETIN ALFA 10000 UNIT/ML IJ SOLN
10000.0000 [IU] | Freq: Once | INTRAMUSCULAR | Status: AC
  Administered 2017-12-14: 10000 [IU] via SUBCUTANEOUS
  Filled 2017-12-14: qty 2

## 2017-12-14 NOTE — Progress Notes (Signed)
Here for follow up " Im  doing real good now " DX w 4-5 ulcers per pt- had biopsy also   Per pt she was dx w H pylori and took 2 antibiotics x 14 d -feeling better she stated .asking for renew Zofran. -pended.

## 2017-12-14 NOTE — Progress Notes (Signed)
[facility] day:  12/14/2017    Chief Complaint: Bianca Shaw is a 72 y.o. female with stage IIA left breast cancer and electrolytes abnormalities (hyponatremia and hypercalcemia) who is seen for 3 month assessment on tamoxifen.   HPI:  The patient was last seen in the medical oncology clinic on 09/21/2017.  At that time, she felt well. She denied any acute complains. Weight was down 1 pound.  Exam was stable.  Hematocrit was 31.1 with a hemoglobin of 10.6. Sodium was 131 (improved). She continued on daily oral sodium tablet.    Mammogram on 08/23/2017 revealed no evidence of malignancy.  She continued tamoxifen.  Routine bone density on 11/08/2017 has improved. Study revealed osteopenia with a T-score of -2.0 (previously -2.9) in the AP spine and -1.8 (previously -1.9) in the LEFT femoral neck.   EGD on 11/18/2017 revealed 3 to 4 non-bleeding ulcers, with the largest measuring 8 mm. There was no centralized bleeding, however there was mild oozing noted at the ulcer edges. Pathology revealed active gastritis with no evidence of high grade dysplasia or malignancy. IHC testing (+) for H. pylori.   She has continued Procrit.  She last received 10,000 units on 11/30/2017.  Hemoglobin was 9.5.  During the interim, patient has been doing "pretty good". Patient denies that she has experienced any B symptoms. She had a "bout with an ulcer" and H pylori.  Course of antibiotics completed 3 days ago.  Patient does not verbalize any concerns with regards to her breast today. Patient performs monthly self breast examinations as recommended.   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 Patient advises that she maintains an adequate appetite. She is eating well. Weight today is 135 lb 1.6 oz (61.3 kg), which compared to her last visit to the clinic, represents a 2 pound decrease.   Patient denies pain in the clinic today.   Past Medical History:   Diagnosis Date  . Anemia   . Anxiety disorder   . Breast cancer of upper-outer quadrant of left female breast ([place]) 11/2015   pT2 pN0(i+).;ER+; PR +, her 2 neu not overexpressed.  Mastectomy, SLN, Mammoprint: Low risk.   . Cancer ([place]) 12/03/2015   left breast/ INVASIVE LOBULAR CARCINOMA.   . Cough    lingering, mild, finished Prednisone and anitbiotic 11/03/15  . Family history of adverse reaction to anesthesia    sister - PONV  . GERD (gastroesophageal reflux disease)   . Hypertension   . Osteopenia   . Osteoporosis   . Pericarditis    diagnonsed June, 2010, unclear etiology as of yer  . Personal history of tobacco use, presenting hazards to health 10/31/2015  . Scleroderma ([facility])    ONLY ON SKIN-MILD  . UTI (lower urinary tract infection)   . Wears dentures    full upper    Past Surgical History:  Procedure Laterality Date  . BREAST BIOPSY Right 2012   core - neg  . BREAST BIOPSY Left 12/03/2015   INVASIVE LOBULAR CARCINOMA.   Marland Kitchen CATARACT EXTRACTION W/ INTRAOCULAR LENS IMPLANT Right   . COLONOSCOPY WITH PROPOFOL N/A 11/08/2015   Procedure: COLONOSCOPY WITH PROPOFOL;  Surgeon: Lucilla Lame, MD;  Location: [facility];  Service: Endoscopy;  Laterality: N/A;  . ESOPHAGOGASTRODUODENOSCOPY (EGD) WITH PROPOFOL N/A 11/18/2017   Procedure: ESOPHAGOGASTRODUODENOSCOPY (EGD) WITH PROPOFOL;  Surgeon: Virgel Manifold, MD;  Location: [facility];  Service: Endoscopy;  Laterality: N/A;  . EVACUATION BREAST  HEMATOMA Left 01/14/2016   Procedure: EVACUATION HEMATOMA BREAST;  Surgeon: Robert Bellow, MD;  Location: [facility];  Service: General;  Laterality: Left;  Marland Kitchen MASTECTOMY Left 2017   complete mastectomy  . MASTECTOMY W/ SENTINEL NODE BIOPSY Left 12/26/2015   Procedure: MASTECTOMY WITH SENTINEL LYMPH NODE BIOPSY;  Surgeon: Robert Bellow, MD;  Location: [facility];  Service: General;  Laterality: Left;  . RIGHT/LEFT HEART CATH AND CORONARY ANGIOGRAPHY N/A 07/22/2016    Procedure: Right/Left Heart Cath and Coronary Angiography;  Surgeon: Minna Merritts, MD;  Location: [facility];  Service: Cardiovascular;  Laterality: N/A;  . TUBAL LIGATION    . VESICOVAGINAL FISTULA CLOSURE W/ TAH      Family History  Problem Relation Age of Onset  . Heart failure Mother   . Epilepsy Mother   . COPD Father   . Heart disease Father   . Anxiety disorder Sister   . Arthritis Brother   . Heart disease Brother   . Vaginal cancer Paternal Grandmother   . Heart attack Paternal Grandfather   . COPD Brother   . Kidney failure Brother   . COPD Brother   . Arthritis Sister   . Uterine cancer Unknown   . Diabetes Unknown   . Colon cancer Neg Hx   . Stomach cancer Neg Hx   . Breast cancer Neg Hx     Social History:  reports that she quit smoking about 14 years ago. Her smoking use included cigarettes. She has a 30.00 pack-year smoking history. She has never used smokeless tobacco. She reports that she drinks about 1.0 - 3.0 standard drinks of alcohol per week. She reports that she does not use drugs.  She has 5 brothers and 2 sisters.  She has 2 children who are alive and well.  She lives in [place] with her husband, Pat Patrick.  The patient is accompanied by her husband today.  Allergies:  Allergies  Allergen Reactions  . Pimenta Nausea And Vomiting  . Tomato     Current Medications: Current Outpatient Medications  Medication Sig Dispense Refill  . aspirin EC 81 MG tablet Take 81 mg by mouth daily.    . diazepam (VALIUM) 5 MG tablet Take by mouth.     . furosemide (LASIX) 20 MG tablet Take 20 mg by mouth daily.     . hydrALAZINE (APRESOLINE) 25 MG tablet Take 1 tablet (25 mg total) by mouth 2 (two) times daily. 60 tablet 11  . lisinopril (PRINIVIL,ZESTRIL) 20 MG tablet Take 1 tablet (20 mg total) by mouth daily. 30 tablet 2  . metoprolol succinate (TOPROL-XL) 25 MG 24 hr tablet Take 1 tablet (25 mg total) by mouth daily. 90 tablet 3  . pantoprazole (PROTONIX)  40 MG tablet Take 1 tablet (40 mg total) by mouth 2 (two) times daily before a meal. 60 tablet 1  . potassium chloride SA (K-DUR,KLOR-CON) 20 MEQ tablet Take 1 tablet (20 mEq total) by mouth daily. 30 tablet 0  . tamoxifen (NOLVADEX) 20 MG tablet Take 1 tablet (20 mg total) by mouth daily. 90 tablet 0  . albuterol (PROVENTIL) (2.5 MG/3ML) 0.083% nebulizer solution Take 3 mLs (2.5 mg total) by nebulization every 6 (six) hours as needed for wheezing or shortness of breath. (Patient not taking: Reported on 12/14/2017) 75 mL 12  . fluticasone (FLONASE) 50 MCG/ACT nasal spray Place 2 sprays into the nose daily as needed for allergies or rhinitis.     Marland Kitchen nitroGLYCERIN (NITROSTAT) 0.4  MG SL tablet Place 1 tablet (0.4 mg total) under the tongue every 5 (five) minutes as needed for chest pain. (Patient not taking: Reported on 12/14/2017) 30 tablet 0  . ondansetron (ZOFRAN) 4 MG tablet Take 1 tablet (4 mg total) by mouth every 8 (eight) hours as needed for nausea or vomiting. (Patient not taking: Reported on 12/14/2017) 20 tablet 0  . simvastatin (ZOCOR) 40 MG tablet Take 1 tablet (40 mg total) by mouth daily. (Patient not taking: Reported on 12/14/2017) 90 tablet 3  . sodium chloride 1 g tablet Take 1 g by mouth 2 (two) times daily with a meal.      No current facility-administered medications for this visit.     Review of Systems  Constitutional: Positive for weight loss (2 pound in 1 month). Negative for diaphoresis, fever and malaise/fatigue.       Feels "pretty good".  HENT: Negative.  Negative for congestion, ear discharge, ear pain, nosebleeds, sinus pain, sore throat and tinnitus.   Eyes: Negative.  Negative for blurred vision, double vision, photophobia, pain, discharge and redness.  Respiratory: Negative for cough, hemoptysis, sputum production and shortness of breath.   Cardiovascular: Positive for leg swelling (L>R). Negative for chest pain, palpitations, orthopnea and PND.  Gastrointestinal:  Negative for abdominal pain, blood in stool, constipation, diarrhea, melena, nausea and vomiting.       Recent completion of antibiotics for H pylori.  Genitourinary: Negative.  Negative for dysuria, frequency and urgency.  Musculoskeletal: Negative.  Negative for back pain, falls, joint pain and myalgias.       Osteoporosis  Skin: Negative for itching and rash.       Scleroderma  Neurological: Negative for dizziness, tremors, sensory change, speech change, focal weakness, weakness and headaches.  Endo/Heme/Allergies: Negative.  Does not bruise/bleed easily.  Psychiatric/Behavioral: Positive for memory loss. Negative for depression. The patient is not nervous/anxious and does not have insomnia.   All other systems reviewed and are negative.  Performance status (ECOG): 0 - Asymptomatic  Vital Signs BP (!) 172/73 (BP Location: Left Arm, Patient Position: Sitting)   Pulse 63   Temp (!) 96.9 F (36.1 C) (Tympanic)   Resp 18   Wt 135 lb 1.6 oz (61.3 kg)   BMI 26.38 kg/m   Physical Exam  Constitutional: She is oriented to person, place, and time and well-developed, well-nourished, and in no distress.  HENT:  Mouth/Throat: Oropharynx is clear and moist.  Short gray hair  Eyes: Pupils are equal, round, and reactive to light. Conjunctivae and EOM are normal. No scleral icterus.  Blue eyes  Neck: Normal range of motion. Neck supple. No JVD present.  Cardiovascular: Normal rate, regular rhythm and normal heart sounds. Exam reveals no friction rub.  No murmur heard. Pulmonary/Chest: Effort normal and breath sounds normal. No respiratory distress. She has no wheezes. She has no rales. Right breast exhibits skin change (fibrocystic changes in upper outer quadrant) and tenderness. Right breast exhibits no inverted nipple, no mass and no nipple discharge. Breasts are asymmetrical (LEFT mestectomy; no erythema, nodularity or skin changes).  Abdominal: Soft. Bowel sounds are normal. She exhibits no  distension and no mass. There is no tenderness. There is no rebound and no guarding.  Musculoskeletal: Normal range of motion. She exhibits no edema or tenderness.  Lymphadenopathy:    She has no cervical adenopathy.    She has no axillary adenopathy.       Right: No inguinal and no supraclavicular adenopathy present.  Left: No inguinal and no supraclavicular adenopathy present.  Neurological: She is alert and oriented to person, place, and time.  Skin: Skin is warm. No rash noted. No erythema.  Psychiatric: Mood, affect and judgment normal.  Nursing note and vitals reviewed.   Imaging studies: 11/21/2015:  Left mammogram and ultrasound revealed a 4.2 x 2.4 x 2.6 cm irregular hypoechoic mass in the 1-2 o'clock position in the left breast 3 cm from the nipple.  Left axillary lymph nodes appeared normal.   12/25/2015:  Breast MRI revealed a 6.2 cm area of abnormal enhancement in the upper outer quadrant of the left breast.  The right breast revealed no mass or abnormal enhancement. 12/08/2016:  Right mammogram revealed no evidence of malignancy. 06/04/2017:  Chest CT revealed no pneumonia, edema, collapse or effusion.  There was a right middle lobe calcified granuloma. There was a stable 8 mm scar posterior aspect of the right upper lobe. 06/11/2017:  Abdomen and pelvic CT revealed no acute process in the abdomen or pelvis.  There was possible bladder wall thickening (? under distention).  There was nonspecific presacral and posterior pelvic edema.  There was central uterine hypoattenuation (subtle). 06/16/2017:  Head MRI without contrast revealed no acute intracranial abnormality.  There was wide spread changes c/w advanced small vessel disease.   06/22/2017:  Bone survey revealed no focal lytic or sclerotic lesion. 08/23/2017: RIGHT diagnostic mammogram and breast ultrasound on 08/23/2017 revealed no mammographic or sonographic evidence of malignancy in the RIGHT breast. Patient has  heterogeneously dense breast tissue (ACR  breast density category C).    Appointment on 12/14/2017  Component Date Value Ref Range Status  . Sodium 12/14/2017 131* 135 - 145 mmol/L Final  . Potassium 12/14/2017 3.4* 3.5 - 5.1 mmol/L Final  . Chloride 12/14/2017 98  98 - 111 mmol/L Final  . CO2 12/14/2017 25  22 - 32 mmol/L Final  . Glucose, Bld 12/14/2017 100* 70 - 99 mg/dL Final  . BUN 12/14/2017 25* 8 - 23 mg/dL Final  . Creatinine, Ser 12/14/2017 1.44* 0.44 - 1.00 mg/dL Final  . Calcium 12/14/2017 9.6  8.9 - 10.3 mg/dL Final  . Total Protein 12/14/2017 7.6  6.5 - 8.1 g/dL Final  . Albumin 12/14/2017 3.3* 3.5 - 5.0 g/dL Final  . AST 12/14/2017 23  15 - 41 U/L Final  . ALT 12/14/2017 21  0 - 44 U/L Final  . Alkaline Phosphatase 12/14/2017 78  38 - 126 U/L Final  . Total Bilirubin 12/14/2017 0.6  0.3 - 1.2 mg/dL Final  . GFR calc non Af Amer 12/14/2017 35* >60 mL/min Final  . GFR calc Af Amer 12/14/2017 41* >60 mL/min Final   Comment: (NOTE) The eGFR has been calculated using the CKD EPI equation. This calculation has not been validated in all clinical situations. eGFR's persistently <60 mL/min signify possible Chronic Kidney Disease.   Georgiann Hahn gap 12/14/2017 8  5 - 15 Final   Performed at [facility], [address] [place], [place] [address]  . WBC 12/14/2017 4.6  3.6 - 11.0 K/uL Final  . RBC 12/14/2017 3.03* 3.80 - 5.20 MIL/uL Final  . Hemoglobin 12/14/2017 9.6* 12.0 - 16.0 g/dL Final  . HCT 12/14/2017 28.5* 35.0 - 47.0 % Final  . MCV 12/14/2017 93.9  80.0 - 100.0 fL Final  . MCH 12/14/2017 31.6  26.0 - 34.0 pg Final  . MCHC 12/14/2017 33.6  32.0 - 36.0 g/dL Final  . RDW 12/14/2017 15.2* 11.5 -  14.5 % Final  . Platelets 12/14/2017 318  150 - 440 K/uL Final  . Neutrophils Relative % 12/14/2017 64  % Final  . Neutro Abs 12/14/2017 3.0  1.4 - 6.5 K/uL Final  . Lymphocytes Relative 12/14/2017 11  % Final  . Lymphs Abs 12/14/2017 0.5* 1.0 - 3.6 K/uL Final  . Monocytes  Relative 12/14/2017 17  % Final  . Monocytes Absolute 12/14/2017 0.8  0.2 - 0.9 K/uL Final  . Eosinophils Relative 12/14/2017 6  % Final  . Eosinophils Absolute 12/14/2017 0.3  0 - 0.7 K/uL Final  . Basophils Relative 12/14/2017 2  % Final  . Basophils Absolute 12/14/2017 0.1  0 - 0.1 K/uL Final   Performed at [facility], [address], [place], [place] [address]    Assessment:  SHAWNTIA MANGAL is a 72 y.o. female with stage IIA (T2N0) left breast cancer s/p mastectomy with sentinel lymph node biopsy on 12/26/2015.   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  Margins were negative.  Two sentinel lymph nodes were negative for macrometastasis, but with isolated tumor cells on IHC stains.  Three additional lymph nodes were positive for isolated tumor cells on IHC.  Tumor was ER positive (> 90%), PR positive (> 90%), and Her2/neu 2+ (eqivocal).  Her2/neu by FISH was negative.  Pathologic stage was pT2 pN0(i+).  MammaPrint testing revealed low risk luminal type A. There was a 97.8% probability of being disease free at 10 years with hormonal therapy.   Right mammogram on 12/08/2016 revealed no evidence of malignancy. RIGHT diagnostic mammogram and breast ultrasound on 08/23/2017 revealed no mammographic or sonographic evidence of malignancy in the RIGHT breast. Patient has heterogeneously dense breast tissue (ACR  breast density category C).   Exam in 11/2016 revealed a 4 mm nodule in the left axillae above the mastectomy incision.  Excision biopsy on 12/15/2016 revealed fat necrosis and no malignancy.  Imaging studies (chest, abdomen, and pelvic CT, head MRI, and bone survey) in 05/2017 revealed no evidence of metastatic disease.  She began tamoxifen on 02/04/2016.  She is tolerating it well.  CA27.29 has been followed: 27.3 on 12/10/2015, 19.4 on 05/30/2016, 16.4 on 09/07/2016, 17.2 on 12/08/2016, 21.3 on 04/09/2017, 17.6 on  08/12/2017, and 18.8 on 12/14/2017.  Bone density on 11/05/2015 revealed osteoporosis with a T score of -2.9 in the AP spine L1-L2 and -1.9 in the left femoral neck.  Bone density on 11/08/2017 revealed osteopenia with a T-score of -2.0  in the AP spine and -1.8 in the LEFT femoral neck. She started Fosamax in 10/2015.  She is on calcium and vitamin D.  She had a colonoscopy in 09/2015.  She denies any melena, hematochezia, hematuria or vaginal bleeding.  She began oral B12 in early 03/2017.  B12 was 295 on 06/09/2016, 299 on 12/08/2016, and 1585 on 05/24/2017.  Folate was 11.5 on 04/09/2017.  EGD on 11/18/2017 revealed 3 to 4 non-bleeding ulcers, with the largest measuring 8 mm. There was no centralized bleeding, however there was mild oozing noted at the ulcer edges. Pathology revealed active gastritis with no evidence of high grade dysplasia or malignancy. IHC testing (+) for H. pylori.   She has a normocytic anemia.  She may have anemia of chronic renal disease.  Work-up on 12/08/2016 and 04/09/2017 revealed a low normal B12 with normal MMA thus r/o B12 deficiency.  SPEP revealed no monoclonal protein.  Free light chain ratio was 2.43 (0.26-1.65),  unclear significance. Ferritin, iron saturation, and folate were normal.  TSH is normal.  Retic was 1.2% (inappropriately low) on 09/07/2016 and 1.1% on 06/23/2017.  Coombs was negative on 10/19/2016 and 06/24/2017.  Creatinine was 1.41 (CrCl 36 ml/min) on 06/29/2017.    24 hour urine (IFE) on 12/28/2016 revealed no monoclonal protein. 24 hour urine on 06/23/2017 revealed kappa free light chains 157 (1.35-24.19), lambda free light chains 6.99 (0.24-6.66), and free light chain ratio 22.46 (2.04-10.37).  Kappa free light chains were 159.9 (ratio 2.79) on 06/23/2017.  SPEP was normal on 06/23/2017.  Bone marrow aspirate and biopsy on 07/07/2017 revealed a variably cellular bone marrow with trilineage hematopoiesis.  There was slight plasmacytosis (4%).  Plasma  cells were polyclonal for kappa and lambda light chains.  There were several lymphoid aggregates.  Congo stain for amyloid was negative.  Flow cytometry revealed no monoclonal B-cell population or abnormal T-cell population.  Cytogenetics were normal (49, XX).  She has a history of chronic hyponatremia (dating back to 07/2012).  She is followed by nephrology.  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  She is on fluid restriction. Cortisol and uric acid were normal on 06/24/2017.   She has scleroderma.  She has skin thickening at sites of abrasion.  She has hypercalcemia felt related to thiazide diuretic and excess calcium intake.  She was seen by endocrinology.  PTH was 14 (low) and not c/w primary hyperparathyroidism.  TSH was normal.  PTH-rp on 06/23/2017 was < 2.0.  ACE level was 47 (normal) on 06/23/2017.  Calcitriol (vitamin D 1, 25 dihydroxy) was 75.3 (19.9-79.3).  Vitamin D, 25 hydroxy was 68.6 (30-100) on 06/22/2017.  She denies excess vitamin A and vitamin D intake.    Symptomatically, She is doing well.  She denies any breast concerns.  Exam is stable.  Hemoglobin is 9.6.  Sodium is 131.  Calcium is 9.6.  CA27.29 is normal.  Plan: 1. Labs today:  CBC with diff, CMP, CA27.29, ferritin. 2. Breast cancer - treatment ongoing  Review mammogram from 08/23/2017 - no evidence of malignancy.   Doing well on current hormonal therapy using tamoxifen.   Tolerating medication with no significant side effects. Continue as prescribed.  3. Anemia of chronic disease  Labs reviewed. Hemoglobin 9.6 and hematocrit 28.5  Last received Procrit injection on 11/30/2017. Will need Procrit injection today.   Continue routine lab monitoring and Procrit as previously prescribed.  4. Osteopenia - improved  T-score of -2.0 (previously -2.9) in the AP spine and -1.8 (previously -1.9) in the LEFT femoral neck.   Ten-year fracture probability by FRAX of 7.1% or greater for hip fracture or 18.5% or  greater for major osteoporotic fracture. 5. SIADH  Sodium 131 off salt tablets.  Recent follow-up with Dr. Holley Raring last week. 6. HYPERcalcemia   Calcium is normal. 7. Nausea  Rx: ondansetron  8. RTC every 2 weeks for labs (CBC) +/- Procrit.  9. RTC in 3 months for MD assessment, labs (CBC with diff, CMP), and +/- Procrit.   Honor Loh, NP  12/14/2017, 10:49 AM  I saw and evaluated the patient, participating in the key portions of the service and reviewing pertinent diagnostic studies and records.  I reviewed the nurse practitioner's note and agree with the findings and the plan.  Several questions were asked by the patient and answered.   Nolon Stalls, MD 12/14/2017,10:49 AM

## 2017-12-15 LAB — CANCER ANTIGEN 27.29: CA 27.29: 18.8 U/mL (ref 0.0–38.6)

## 2017-12-28 ENCOUNTER — Inpatient Hospital Stay: Payer: PPO | Attending: Hematology and Oncology

## 2017-12-28 ENCOUNTER — Inpatient Hospital Stay: Payer: PPO

## 2017-12-28 DIAGNOSIS — C50412 Malignant neoplasm of upper-outer quadrant of left female breast: Secondary | ICD-10-CM

## 2017-12-28 DIAGNOSIS — D631 Anemia in chronic kidney disease: Secondary | ICD-10-CM | POA: Diagnosis not present

## 2017-12-28 DIAGNOSIS — N189 Chronic kidney disease, unspecified: Secondary | ICD-10-CM | POA: Diagnosis not present

## 2017-12-28 LAB — CBC WITH DIFFERENTIAL/PLATELET
Basophils Absolute: 0.1 10*3/uL (ref 0–0.1)
Basophils Relative: 2 %
Eosinophils Absolute: 0.4 10*3/uL (ref 0–0.7)
Eosinophils Relative: 9 %
HCT: 29.9 % — ABNORMAL LOW (ref 35.0–47.0)
Hemoglobin: 10 g/dL — ABNORMAL LOW (ref 12.0–16.0)
Lymphocytes Relative: 18 %
Lymphs Abs: 0.9 10*3/uL — ABNORMAL LOW (ref 1.0–3.6)
MCH: 31.5 pg (ref 26.0–34.0)
MCHC: 33.4 g/dL (ref 32.0–36.0)
MCV: 94.3 fL (ref 80.0–100.0)
Monocytes Absolute: 0.7 10*3/uL (ref 0.2–0.9)
Monocytes Relative: 14 %
Neutro Abs: 2.9 10*3/uL (ref 1.4–6.5)
Neutrophils Relative %: 57 %
Platelets: 316 10*3/uL (ref 150–440)
RBC: 3.17 MIL/uL — ABNORMAL LOW (ref 3.80–5.20)
RDW: 15 % — ABNORMAL HIGH (ref 11.5–14.5)
WBC: 5 10*3/uL (ref 3.6–11.0)

## 2018-01-11 ENCOUNTER — Inpatient Hospital Stay: Payer: PPO

## 2018-01-11 ENCOUNTER — Other Ambulatory Visit: Payer: Self-pay

## 2018-01-11 VITALS — BP 143/63 | HR 62

## 2018-01-11 DIAGNOSIS — N189 Chronic kidney disease, unspecified: Secondary | ICD-10-CM

## 2018-01-11 DIAGNOSIS — C50412 Malignant neoplasm of upper-outer quadrant of left female breast: Secondary | ICD-10-CM

## 2018-01-11 DIAGNOSIS — D631 Anemia in chronic kidney disease: Principal | ICD-10-CM

## 2018-01-11 LAB — CBC WITH DIFFERENTIAL/PLATELET
Basophils Absolute: 0.1 10*3/uL (ref 0–0.1)
Basophils Relative: 1 %
Eosinophils Absolute: 0.2 10*3/uL (ref 0–0.7)
Eosinophils Relative: 5 %
HCT: 28.9 % — ABNORMAL LOW (ref 35.0–47.0)
Hemoglobin: 9.8 g/dL — ABNORMAL LOW (ref 12.0–16.0)
Lymphocytes Relative: 10 %
Lymphs Abs: 0.5 10*3/uL — ABNORMAL LOW (ref 1.0–3.6)
MCH: 32.3 pg (ref 26.0–34.0)
MCHC: 33.8 g/dL (ref 32.0–36.0)
MCV: 95.3 fL (ref 80.0–100.0)
Monocytes Absolute: 0.7 10*3/uL (ref 0.2–0.9)
Monocytes Relative: 15 %
Neutro Abs: 3.3 10*3/uL (ref 1.4–6.5)
Neutrophils Relative %: 69 %
Platelets: 259 10*3/uL (ref 150–440)
RBC: 3.03 MIL/uL — ABNORMAL LOW (ref 3.80–5.20)
RDW: 14.8 % — ABNORMAL HIGH (ref 11.5–14.5)
WBC: 4.8 10*3/uL (ref 3.6–11.0)

## 2018-01-11 MED ORDER — EPOETIN ALFA 10000 UNIT/ML IJ SOLN
10000.0000 [IU] | Freq: Once | INTRAMUSCULAR | Status: AC
  Administered 2018-01-11: 10000 [IU] via SUBCUTANEOUS

## 2018-01-19 ENCOUNTER — Encounter: Payer: Self-pay | Admitting: Gastroenterology

## 2018-01-19 ENCOUNTER — Ambulatory Visit: Payer: PPO | Admitting: Gastroenterology

## 2018-01-19 VITALS — BP 148/67 | HR 60 | Ht 65.0 in | Wt 131.8 lb

## 2018-01-19 DIAGNOSIS — K259 Gastric ulcer, unspecified as acute or chronic, without hemorrhage or perforation: Secondary | ICD-10-CM | POA: Diagnosis not present

## 2018-01-19 DIAGNOSIS — Z8719 Personal history of other diseases of the digestive system: Secondary | ICD-10-CM | POA: Diagnosis not present

## 2018-01-19 DIAGNOSIS — Z8711 Personal history of peptic ulcer disease: Secondary | ICD-10-CM

## 2018-01-19 MED ORDER — PANTOPRAZOLE SODIUM 40 MG PO TBEC: 40.0000 mg | DELAYED_RELEASE_TABLET | Freq: Two times a day (BID) | ORAL | 0 refills | Status: DC

## 2018-01-19 NOTE — Progress Notes (Signed)
Vonda Antigua, MD [address]  [place]  [place], [place] [address]  Main: [phone]  Fax: [phone]   Primary Care Physician: Sofie Hartigan, MD  Primary Gastroenterologist:  Dr. Vonda Antigua  Chief Complaint  Patient presents with  . Follow-up    stomach ulcers    HPI: Bianca Shaw is a 72 y.o. female here for follow up of abdominal pain, gastric ulcers and H. pylori.  Abdominal pain resolved after H. pylori treatment with triple therapy and continue Protonix 40 mg p.o. twice daily.  Underwent EGD on November 18, 2017 due to epigastric abdominal pain and nausea vomiting.  This showed many 3-4 nonobstructing nonbleeding superficial gastric ulcers, one with a pigmented material and heaped edges.  Localized mildly scalloped mucosa from the second portion of the duodenum as well.  DIAGNOSIS:  A. DUODENUM: COLD BIOPSY:  - SMALL BOWEL MUCOSA WITH INTACT VILLOUS ARCHITECTURE.  - NEGATIVE FOR INTRAEPITHELIAL LYMPHOCYTOSIS, DYSPLASIA AND MALIGNANCY.   B. 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  - NEGATIVE FOR DYSPLASIA AND MALIGNANCY.   C. STOMACH ULCER, PATCHY; COLD BIOPSY:  - MODERATE TO MARKED CHRONIC ACTIVE GASTRITIS WITH EXUDATE, CONSISTENT  WITH NEARBY ULCERATION (SEE NOTE).  - NEGATIVE FOR DYSPLASIA AND MALIGNANCY.   Note: Immunohistochemical stain for H. pylori is obtained and results  will be issued in an addendum.   Last colonoscopy was in 2017 for screening by Dr. Allen Norris, and was normal except for diverticulosis and hemorrhoids, and repeat recommended in 10 years.  Current Outpatient Medications  Medication Sig Dispense Refill  . albuterol (PROVENTIL) (2.5 MG/3ML) 0.083% nebulizer solution Take 3 mLs (2.5 mg total) by nebulization every 6 (six) hours as needed for wheezing or shortness of breath. 75 mL 12  . aspirin EC 81 MG tablet Take 81 mg by mouth  daily.    . diazepam (VALIUM) 5 MG tablet Take by mouth.     . fluticasone (FLONASE) 50 MCG/ACT nasal spray Place 2 sprays into the nose daily as needed for allergies or rhinitis.     . furosemide (LASIX) 20 MG tablet Take 20 mg by mouth daily.     . hydrALAZINE (APRESOLINE) 25 MG tablet Take 1 tablet (25 mg total) by mouth 2 (two) times daily. 60 tablet 11  . lisinopril (PRINIVIL,ZESTRIL) 20 MG tablet Take 1 tablet (20 mg total) by mouth daily. 30 tablet 2  . metoprolol succinate (TOPROL-XL) 25 MG 24 hr tablet Take 1 tablet (25 mg total) by mouth daily. 90 tablet 3  . nitroGLYCERIN (NITROSTAT) 0.4 MG SL tablet Place 1 tablet (0.4 mg total) under the tongue every 5 (five) minutes as needed for chest pain. 30 tablet 0  . ondansetron (ZOFRAN) 4 MG tablet Take 1 tablet (4 mg total) by mouth every 8 (eight) hours as needed for nausea or vomiting. 20 tablet 0  . pantoprazole (PROTONIX) 40 MG tablet     . potassium chloride SA (K-DUR,KLOR-CON) 20 MEQ tablet Take 1 tablet (20 mEq total) by mouth daily. 30 tablet 0  . simvastatin (ZOCOR) 40 MG tablet Take 1 tablet (40 mg total) by mouth daily. 90 tablet 3  . sodium chloride 1 g tablet Take 1 g by mouth 2 (two) times daily with a meal.     . tamoxifen (NOLVADEX) 20 MG tablet Take 1 tablet (20 mg total) by mouth daily. 90 tablet 0  . pantoprazole (PROTONIX) 40 MG  tablet Take 1 tablet (40 mg total) by mouth 2 (two) times daily before a meal. 60 tablet 1   No current facility-administered medications for this visit.     Allergies as of 01/19/2018 - Review Complete 01/19/2018  Allergen Reaction Noted  . Pimenta Nausea And Vomiting 11/04/2015  . Tomato  06/22/2017    ROS:  General: Negative for anorexia, weight loss, fever, chills, fatigue, weakness. ENT: Negative for hoarseness, difficulty swallowing , nasal congestion. CV: Negative for chest pain, angina, palpitations, dyspnea on exertion, peripheral edema.  Respiratory: Negative for dyspnea at  rest, dyspnea on exertion, cough, sputum, wheezing.  GI: See history of present illness. GU:  Negative for dysuria, hematuria, urinary incontinence, urinary frequency, nocturnal urination.  Endo: Negative for unusual weight change.    Physical Examination:   BP (!) 148/67   Pulse 60   Ht 5\' 5"  (1.651 m)   Wt 131 lb 12.8 oz (59.8 kg)   BMI 21.93 kg/m   General: Well-nourished, well-developed in no acute distress.  Eyes: No icterus. Conjunctivae pink. Mouth: Oropharyngeal mucosa moist and pink , no lesions erythema or exudate. Neck: Supple, Trachea midline Abdomen: Bowel sounds are normal, nontender, nondistended, no hepatosplenomegaly or masses, no abdominal bruits or hernia , no rebound or guarding.   Extremities: No lower extremity edema. No clubbing or deformities. Neuro: Alert and oriented x 3.  Grossly intact. Skin: Warm and dry, no jaundice.   Psych: Alert and cooperative, normal mood and affect.   Labs: CMP     Component Value Date/Time   NA 131 (L) 12/14/2017 0943   NA 127 (L) 05/24/2017 1613   NA 125 (L) 07/11/2013 1115   K 3.4 (L) 12/14/2017 0943   K 4.5 07/11/2013 1115   CL 98 12/14/2017 0943   CL 90 (L) 07/11/2013 1115   CO2 25 12/14/2017 0943   CO2 25 07/11/2013 1115   GLUCOSE 100 (H) 12/14/2017 0943   GLUCOSE 115 (H) 07/11/2013 1115   BUN 25 (H) 12/14/2017 0943   BUN 25 05/24/2017 1613   BUN 15 07/11/2013 1115   CREATININE 1.44 (H) 12/14/2017 0943   CREATININE 1.16 (H) 03/29/2017 1024   CALCIUM 9.6 12/14/2017 0943   CALCIUM 11.6 (H) 06/11/2017 1339   PROT 7.6 12/14/2017 0943   PROT 6.7 05/24/2017 1613   PROT 8.2 07/11/2013 1115   ALBUMIN 3.3 (L) 12/14/2017 0943   ALBUMIN 3.4 (L) 05/24/2017 1613   ALBUMIN 3.8 07/11/2013 1115   AST 23 12/14/2017 0943   AST 23 07/11/2013 1115   ALT 21 12/14/2017 0943   ALT 22 07/11/2013 1115   ALKPHOS 78 12/14/2017 0943   ALKPHOS 72 07/11/2013 1115   BILITOT 0.6 12/14/2017 0943   BILITOT 0.3 05/24/2017 1613    BILITOT 0.3 07/11/2013 1115   GFRNONAA 35 (L) 12/14/2017 0943   GFRNONAA 47 (L) 03/29/2017 1024   GFRAA 41 (L) 12/14/2017 0943   GFRAA 54 (L) 03/29/2017 1024   Lab Results  Component Value Date   WBC 4.8 01/11/2018   HGB 9.8 (L) 01/11/2018   HCT 28.9 (L) 01/11/2018   MCV 95.3 01/11/2018   PLT 259 01/11/2018    Imaging Studies: No results found.  Assessment and Plan:   Bianca Shaw is a 72 y.o. y/o female here for follow-up of abdominal pain, completely resolved after treatment for H. pylori with triple therapy and PPI twice daily for gastric ulcer seen on EGD in July 2019  We will repeat EGD in 3  months from last procedure to evaluate for healing of gastric ulcers Also focal intestinal metaplasia seen on 1 of the ulcer biopsy sites.  Therefore, repeat biopsies needed to see if focal intestinal metaplasia is still present after healing of ulcers and treatment of H. pylori  Continue to avoid NSAIDs PPI twice daily indicated for a total of 3 months given multiple ulcer seen on EGD (Risks of PPI use were discussed with patient including bone loss, C. Diff diarrhea, pneumonia, infections, CKD, electrolyte abnormalities.  If clinically possible based on symptoms, goal would be to maintain patient on the lowest dose possible, or discontinue the medication with institution of lifestyle modifications over time. Pt. Verbalizes understanding and chooses to continue the medication.) Benefits of medication outweigh risks at this time given multiple ulcers and significant symptoms prior to treatment  Colonoscopy up-to-date, repeat colonoscopy recommended in 10 years from 2017  I have discussed alternative options, risks & benefits,  which include, but are not limited to, bleeding, infection, perforation,respiratory complication & drug reaction.  The patient agrees with this plan & written consent will be obtained.      Dr Vonda Antigua

## 2018-01-25 ENCOUNTER — Inpatient Hospital Stay: Payer: PPO | Attending: Hematology and Oncology

## 2018-01-25 ENCOUNTER — Other Ambulatory Visit: Payer: Self-pay

## 2018-01-25 ENCOUNTER — Inpatient Hospital Stay: Payer: PPO

## 2018-01-25 DIAGNOSIS — N189 Chronic kidney disease, unspecified: Secondary | ICD-10-CM | POA: Insufficient documentation

## 2018-01-25 DIAGNOSIS — D631 Anemia in chronic kidney disease: Secondary | ICD-10-CM | POA: Insufficient documentation

## 2018-01-25 DIAGNOSIS — C50412 Malignant neoplasm of upper-outer quadrant of left female breast: Secondary | ICD-10-CM

## 2018-01-25 LAB — CBC WITH DIFFERENTIAL/PLATELET
Basophils Absolute: 0.1 10*3/uL (ref 0–0.1)
Basophils Relative: 1 %
Eosinophils Absolute: 0.2 10*3/uL (ref 0–0.7)
Eosinophils Relative: 3 %
HCT: 30.1 % — ABNORMAL LOW (ref 35.0–47.0)
Hemoglobin: 10.1 g/dL — ABNORMAL LOW (ref 12.0–16.0)
Lymphocytes Relative: 9 %
Lymphs Abs: 0.5 10*3/uL — ABNORMAL LOW (ref 1.0–3.6)
MCH: 31.7 pg (ref 26.0–34.0)
MCHC: 33.5 g/dL (ref 32.0–36.0)
MCV: 94.5 fL (ref 80.0–100.0)
Monocytes Absolute: 0.8 10*3/uL (ref 0.2–0.9)
Monocytes Relative: 15 %
Neutro Abs: 3.9 10*3/uL (ref 1.4–6.5)
Neutrophils Relative %: 72 %
Platelets: 281 10*3/uL (ref 150–440)
RBC: 3.18 MIL/uL — ABNORMAL LOW (ref 3.80–5.20)
RDW: 14.5 % (ref 11.5–14.5)
WBC: 5.4 10*3/uL (ref 3.6–11.0)

## 2018-02-08 ENCOUNTER — Inpatient Hospital Stay: Payer: PPO

## 2018-02-08 VITALS — BP 112/59 | HR 57

## 2018-02-08 DIAGNOSIS — N189 Chronic kidney disease, unspecified: Secondary | ICD-10-CM

## 2018-02-08 DIAGNOSIS — C50412 Malignant neoplasm of upper-outer quadrant of left female breast: Secondary | ICD-10-CM

## 2018-02-08 DIAGNOSIS — D631 Anemia in chronic kidney disease: Principal | ICD-10-CM

## 2018-02-08 LAB — CBC WITH DIFFERENTIAL/PLATELET
Abs Immature Granulocytes: 0.01 10*3/uL (ref 0.00–0.07)
Basophils Absolute: 0 10*3/uL (ref 0.0–0.1)
Basophils Relative: 1 %
Eosinophils Absolute: 0.1 10*3/uL (ref 0.0–0.5)
Eosinophils Relative: 2 %
HCT: 28.4 % — ABNORMAL LOW (ref 36.0–46.0)
Hemoglobin: 9.4 g/dL — ABNORMAL LOW (ref 12.0–15.0)
Immature Granulocytes: 0 %
Lymphocytes Relative: 10 %
Lymphs Abs: 0.5 10*3/uL — ABNORMAL LOW (ref 0.7–4.0)
MCH: 30.5 pg (ref 26.0–34.0)
MCHC: 33.1 g/dL (ref 30.0–36.0)
MCV: 92.2 fL (ref 80.0–100.0)
Monocytes Absolute: 0.7 10*3/uL (ref 0.1–1.0)
Monocytes Relative: 13 %
Neutro Abs: 3.9 10*3/uL (ref 1.7–7.7)
Neutrophils Relative %: 74 %
Platelets: 252 10*3/uL (ref 150–400)
RBC: 3.08 MIL/uL — ABNORMAL LOW (ref 3.87–5.11)
RDW: 13.9 % (ref 11.5–15.5)
WBC: 5.3 10*3/uL (ref 4.0–10.5)
nRBC: 0 % (ref 0.0–0.2)

## 2018-02-08 MED ORDER — EPOETIN ALFA 10000 UNIT/ML IJ SOLN
10000.0000 [IU] | Freq: Once | INTRAMUSCULAR | Status: AC
  Administered 2018-02-08: 10000 [IU] via SUBCUTANEOUS

## 2018-02-14 ENCOUNTER — Encounter: Payer: Self-pay | Admitting: *Deleted

## 2018-02-14 ENCOUNTER — Other Ambulatory Visit: Payer: Self-pay

## 2018-02-14 ENCOUNTER — Telehealth: Payer: Self-pay | Admitting: Gastroenterology

## 2018-02-14 DIAGNOSIS — Z8719 Personal history of other diseases of the digestive system: Secondary | ICD-10-CM

## 2018-02-14 DIAGNOSIS — Z8711 Personal history of peptic ulcer disease: Secondary | ICD-10-CM

## 2018-02-14 NOTE — Telephone Encounter (Signed)
Pt husband left vm about pt apt please call

## 2018-02-14 NOTE — Telephone Encounter (Signed)
Husband notified to have pt at the [facility] medical mall registration desk at 10:00am. I also notified T.T. That place and provider has changed so insurance could be notified. Pt's husband very appreciative.

## 2018-02-14 NOTE — Telephone Encounter (Signed)
Pt's husband and pt agreed to have EGD done tomorrow at [facility] by Dr. Vicente Males. Pt is not feeling well and would like to see what is wrong with her stomach. The procedure was to be tomorrow 10/22 in the [facility] outpatient surgical center but this had been changed to 03/08/2018 and pt was unaware. [place] thought that Dr. Bonna Gains was off that day. [place] aware to cancel 03/08/18 EGD via voice mail, and contacted [facility] to add pt to Dr. Georgeann Oppenheim endo scheduled.

## 2018-02-14 NOTE — Telephone Encounter (Signed)
Pt husband left vm he called the [facility] surgery center because pt is supposed to have procedure tomorrow and they told him they do not have her down please caLL

## 2018-02-15 ENCOUNTER — Encounter
Admission: RE | Disposition: A | Discharge status: Home / Self Care | Payer: Self-pay | Source: Ambulatory Visit | Attending: Gastroenterology

## 2018-02-15 ENCOUNTER — Ambulatory Visit: Payer: PPO | Admitting: Anesthesiology

## 2018-02-15 ENCOUNTER — Ambulatory Visit
Admission: RE | Admit: 2018-02-15 | Discharge: 2018-02-15 | Disposition: A | Discharge status: Home / Self Care | Payer: PPO | Source: Ambulatory Visit | Attending: Gastroenterology | Admitting: Gastroenterology

## 2018-02-15 ENCOUNTER — Encounter: Payer: Self-pay | Admitting: *Deleted

## 2018-02-15 DIAGNOSIS — Z8719 Personal history of other diseases of the digestive system: Secondary | ICD-10-CM | POA: Diagnosis not present

## 2018-02-15 DIAGNOSIS — Z87891 Personal history of nicotine dependence: Secondary | ICD-10-CM | POA: Diagnosis not present

## 2018-02-15 DIAGNOSIS — Z7982 Long term (current) use of aspirin: Secondary | ICD-10-CM | POA: Diagnosis not present

## 2018-02-15 DIAGNOSIS — Z8711 Personal history of peptic ulcer disease: Secondary | ICD-10-CM

## 2018-02-15 DIAGNOSIS — K219 Gastro-esophageal reflux disease without esophagitis: Secondary | ICD-10-CM | POA: Diagnosis not present

## 2018-02-15 DIAGNOSIS — K3189 Other diseases of stomach and duodenum: Secondary | ICD-10-CM | POA: Diagnosis not present

## 2018-02-15 DIAGNOSIS — I10 Essential (primary) hypertension: Secondary | ICD-10-CM | POA: Insufficient documentation

## 2018-02-15 DIAGNOSIS — E7800 Pure hypercholesterolemia, unspecified: Secondary | ICD-10-CM | POA: Diagnosis not present

## 2018-02-15 DIAGNOSIS — K254 Chronic or unspecified gastric ulcer with hemorrhage: Secondary | ICD-10-CM | POA: Diagnosis not present

## 2018-02-15 DIAGNOSIS — Z79899 Other long term (current) drug therapy: Secondary | ICD-10-CM | POA: Diagnosis not present

## 2018-02-15 DIAGNOSIS — Z853 Personal history of malignant neoplasm of breast: Secondary | ICD-10-CM | POA: Insufficient documentation

## 2018-02-15 DIAGNOSIS — K269 Duodenal ulcer, unspecified as acute or chronic, without hemorrhage or perforation: Secondary | ICD-10-CM | POA: Diagnosis not present

## 2018-02-15 DIAGNOSIS — K529 Noninfective gastroenteritis and colitis, unspecified: Secondary | ICD-10-CM | POA: Diagnosis not present

## 2018-02-15 HISTORY — PX: ESOPHAGOGASTRODUODENOSCOPY (EGD) WITH PROPOFOL: SHX5813

## 2018-02-15 SURGERY — ESOPHAGOGASTRODUODENOSCOPY (EGD) WITH PROPOFOL
Anesthesia: General

## 2018-02-15 MED ORDER — PROPOFOL 10 MG/ML IV BOLUS
INTRAVENOUS | Status: DC | PRN
  Administered 2018-02-15: 10 mg via INTRAVENOUS
  Administered 2018-02-15 (×2): 20 mg via INTRAVENOUS
  Administered 2018-02-15: 10 mg via INTRAVENOUS
  Administered 2018-02-15: 20 mg via INTRAVENOUS
  Administered 2018-02-15: 30 mg via INTRAVENOUS
  Administered 2018-02-15: 20 mg via INTRAVENOUS

## 2018-02-15 MED ORDER — SODIUM CHLORIDE 0.9 % IV SOLN
INTRAVENOUS | Status: DC
  Administered 2018-02-15: 1000 mL via INTRAVENOUS

## 2018-02-15 MED ORDER — PROPOFOL 10 MG/ML IV BOLUS
INTRAVENOUS | Status: AC
  Filled 2018-02-15: qty 20

## 2018-02-15 NOTE — Transfer of Care (Signed)
Immediate Anesthesia Transfer of Care Note  Patient: Bianca Shaw  Procedure(s) Performed: ESOPHAGOGASTRODUODENOSCOPY (EGD) WITH PROPOFOL (N/A )  Patient Location: PACU  Anesthesia Type:General  Level of Consciousness: awake and sedated  Airway & Oxygen Therapy: Patient Spontanous Breathing and Patient connected to nasal cannula oxygen  Post-op Assessment: Report given to RN and Post -op Vital signs reviewed and stable  Post vital signs: Reviewed and stable  Last Vitals:  Vitals Value Taken Time  BP    Temp    Pulse    Resp    SpO2      Last Pain:  Vitals:   02/15/18 1017  TempSrc: Tympanic  PainSc: 0-No pain         Complications: No apparent anesthesia complications

## 2018-02-15 NOTE — Anesthesia Preprocedure Evaluation (Addendum)
Anesthesia Evaluation  Patient identified by MRN, date of birth, ID band Patient awake    Reviewed: Allergy & Precautions, H&P , NPO status , Patient's Chart, lab work & pertinent test results, reviewed documented beta blocker date and time   Airway Mallampati: II   Neck ROM: full    Dental  (+) Teeth Intact   Pulmonary shortness of breath and with exertion, former smoker,    Pulmonary exam normal        Cardiovascular Exercise Tolerance: Poor hypertension, On Medications + angina with exertion + CAD and + Past MI  Normal cardiovascular exam Rhythm:regular Rate:Normal     Neuro/Psych PSYCHIATRIC DISORDERS Anxiety negative neurological ROS     GI/Hepatic Neg liver ROS, PUD, GERD  Medicated,  Endo/Other  negative endocrine ROS  Renal/GU Renal disease  negative genitourinary   Musculoskeletal   Abdominal   Peds  Hematology  (+) Blood dyscrasia, anemia ,   Anesthesia Other Findings Past Medical History: No date: Anemia No date: Anxiety disorder 11/2015: Breast cancer of upper-outer quadrant of left female breast  ([place])     Comment:  pT2 pN0(i+).;ER+; PR +, her 2 neu not overexpressed.                Mastectomy, SLN, Mammoprint: Low risk.  12/03/2015: Cancer ([place])     Comment:  left breast/ INVASIVE LOBULAR CARCINOMA.  No date: Cough     Comment:  lingering, mild, finished Prednisone and anitbiotic               11/03/15 No date: Family history of adverse reaction to anesthesia     Comment:  sister - PONV No date: GERD (gastroesophageal reflux disease) No date: Hypertension No date: Osteopenia No date: Osteoporosis No date: Pericarditis     Comment:  diagnonsed June, 2010, unclear etiology as of yer 10/31/2015: Personal history of tobacco use, presenting hazards to  health No date: Scleroderma ([place])     Comment:  ONLY ON SKIN-MILD No date: UTI (lower urinary tract infection) No date: Wears dentures  Comment:  full upper Past Surgical History: 2012: BREAST BIOPSY; Right     Comment:  core - neg 12/03/2015: BREAST BIOPSY; Left     Comment:  INVASIVE LOBULAR CARCINOMA.  No date: CARDIAC CATHETERIZATION No date: CATARACT EXTRACTION W/ INTRAOCULAR LENS IMPLANT; Right 11/08/2015: COLONOSCOPY WITH PROPOFOL; N/A     Comment:  Procedure: COLONOSCOPY WITH PROPOFOL;  Surgeon: Lucilla Lame, MD;  Location: [facility];  Service:               Endoscopy;  Laterality: N/A; 11/18/2017: ESOPHAGOGASTRODUODENOSCOPY (EGD) WITH PROPOFOL; N/A     Comment:  Procedure: ESOPHAGOGASTRODUODENOSCOPY (EGD) WITH               PROPOFOL;  Surgeon: Virgel Manifold, MD;  Location:               [facility];  Service: Endoscopy;  Laterality: N/A; 01/14/2016: EVACUATION BREAST HEMATOMA; Left     Comment:  Procedure: EVACUATION HEMATOMA BREAST;  Surgeon: Robert Bellow, MD;  Location: [facility];  Service: General;                Laterality: Left; No date: EYE SURGERY 2017: MASTECTOMY; Left     Comment:  complete mastectomy 12/26/2015: MASTECTOMY W/ SENTINEL NODE BIOPSY; Left  Comment:  Procedure: MASTECTOMY WITH SENTINEL LYMPH NODE BIOPSY;                Surgeon: Robert Bellow, MD;  Location: [facility];                Service: General;  Laterality: Left; 07/22/2016: RIGHT/LEFT HEART CATH AND CORONARY ANGIOGRAPHY; N/A     Comment:  Procedure: Right/Left Heart Cath and Coronary               Angiography;  Surgeon: Minna Merritts, MD;  Location:               [facility];  Service: Cardiovascular;                Laterality: N/A; No date: TUBAL LIGATION No date: VESICOVAGINAL FISTULA CLOSURE W/ TAH BMI    Body Mass Index:  22.11 kg/m     Reproductive/Obstetrics negative OB ROS                            Anesthesia Physical Anesthesia Plan  ASA: III  Anesthesia Plan: General   Post-op Pain Management:    Induction:   PONV  Risk Score and Plan:   Airway Management Planned:   Additional Equipment:   Intra-op Plan:   Post-operative Plan:   Informed Consent: I have reviewed the patients History and Physical, chart, labs and discussed the procedure including the risks, benefits and alternatives for the proposed anesthesia with the patient or authorized representative who has indicated his/her understanding and acceptance.   Dental Advisory Given  Plan Discussed with: CRNA  Anesthesia Plan Comments:         Anesthesia Quick Evaluation

## 2018-02-15 NOTE — H&P (Signed)
Jonathon Bellows, MD [address] [place], [place], [place], [address] [address], [place], [place], [place], [address] Phone: [phone]  Fax: [phone]  Primary Care Physician:  Sofie Hartigan, MD   Pre-Procedure History & Physical: HPI:  Bianca Shaw is a 72 y.o. female is here for an endoscopy    Past Medical History:  Diagnosis Date  . Anemia   . Anxiety disorder   . Breast cancer of upper-outer quadrant of left female breast ([place]) 11/2015   pT2 pN0(i+).;ER+; PR +, her 2 neu not overexpressed.  Mastectomy, SLN, Mammoprint: Low risk.   . Cancer ([place]) 12/03/2015   left breast/ INVASIVE LOBULAR CARCINOMA.   . Cough    lingering, mild, finished Prednisone and anitbiotic 11/03/15  . Family history of adverse reaction to anesthesia    sister - PONV  . GERD (gastroesophageal reflux disease)   . Hypertension   . Osteopenia   . Osteoporosis   . Pericarditis    diagnonsed June, 2010, unclear etiology as of yer  . Personal history of tobacco use, presenting hazards to health 10/31/2015  . Scleroderma ([facility])    ONLY ON SKIN-MILD  . UTI (lower urinary tract infection)   . Wears dentures    full upper    Past Surgical History:  Procedure Laterality Date  . BREAST BIOPSY Right 2012   core - neg  . BREAST BIOPSY Left 12/03/2015   INVASIVE LOBULAR CARCINOMA.   Marland Kitchen CARDIAC CATHETERIZATION    . CATARACT EXTRACTION W/ INTRAOCULAR LENS IMPLANT Right   . COLONOSCOPY WITH PROPOFOL N/A 11/08/2015   Procedure: COLONOSCOPY WITH PROPOFOL;  Surgeon: Lucilla Lame, MD;  Location: [facility];  Service: Endoscopy;  Laterality: N/A;  . ESOPHAGOGASTRODUODENOSCOPY (EGD) WITH PROPOFOL N/A 11/18/2017   Procedure: ESOPHAGOGASTRODUODENOSCOPY (EGD) WITH PROPOFOL;  Surgeon: Virgel Manifold, MD;  Location: [facility];  Service: Endoscopy;  Laterality: N/A;  . EVACUATION BREAST HEMATOMA Left 01/14/2016   Procedure: EVACUATION HEMATOMA BREAST;  Surgeon: Robert Bellow, MD;  Location: [facility];  Service: General;  Laterality: Left;  . EYE SURGERY    . MASTECTOMY Left 2017   complete mastectomy  . MASTECTOMY W/ SENTINEL NODE BIOPSY Left 12/26/2015   Procedure: MASTECTOMY WITH SENTINEL LYMPH NODE BIOPSY;  Surgeon: Robert Bellow, MD;  Location: [facility];  Service: General;  Laterality: Left;  . RIGHT/LEFT HEART CATH AND CORONARY ANGIOGRAPHY N/A 07/22/2016   Procedure: Right/Left Heart Cath and Coronary Angiography;  Surgeon: Minna Merritts, MD;  Location: [facility];  Service: Cardiovascular;  Laterality: N/A;  . TUBAL LIGATION    . VESICOVAGINAL FISTULA CLOSURE W/ TAH      Prior to Admission medications   Medication Sig Start Date End Date Taking? Authorizing Provider  aspirin EC 81 MG tablet Take 81 mg by mouth daily.   Yes [provider]  diazepam (VALIUM) 5 MG tablet Take by mouth.  11/08/17  Yes [provider]  furosemide (LASIX) 20 MG tablet Take 20 mg by mouth daily.  11/08/17  Yes [provider]  hydrALAZINE (APRESOLINE) 25 MG tablet Take 1 tablet (25 mg total) by mouth 2 (two) times daily. 04/29/17  Yes Jerrol Banana., MD  lisinopril (PRINIVIL,ZESTRIL) 20 MG tablet Take 1 tablet (20 mg total) by mouth daily. 06/26/17  Yes Gladstone Lighter, MD  metoprolol succinate (TOPROL-XL) 25 MG 24 hr tablet Take 1 tablet (25 mg total) by mouth  daily. 05/24/17  Yes Jerrol Banana., MD  ondansetron [facility]) 4 MG tablet Take 1 tablet (4 mg total) by mouth every 8 (eight) hours as needed for nausea or vomiting. 12/14/17  Yes Corcoran, Drue Second, MD  potassium chloride SA (K-DUR,KLOR-CON) 20 MEQ tablet Take 1 tablet (20 mEq total) by mouth daily. 06/12/17  Yes Vaughan Basta, MD  tamoxifen (NOLVADEX) 20 MG tablet Take 1 tablet (20 mg total) by mouth daily. 08/02/17  Yes Corcoran, Drue Second, MD  albuterol (PROVENTIL) (2.5 MG/3ML) 0.083% nebulizer solution Take 3 mLs (2.5 mg total) by nebulization every 6  (six) hours as needed for wheezing or shortness of breath. 06/06/15   Mar Daring, PA-C  fluticasone (FLONASE) 50 MCG/ACT nasal spray Place 2 sprays into the nose daily as needed for allergies or rhinitis.     [provider]  nitroGLYCERIN (NITROSTAT) 0.4 MG SL tablet Place 1 tablet (0.4 mg total) under the tongue every 5 (five) minutes as needed for chest pain. 07/17/16   Bettey Costa, MD  pantoprazole (PROTONIX) 40 MG tablet Take 1 tablet (40 mg total) by mouth 2 (two) times daily before a meal. Patient not taking: Reported on 02/15/2018 01/19/18   Virgel Manifold, MD  simvastatin (ZOCOR) 40 MG tablet Take 1 tablet (40 mg total) by mouth daily. Patient not taking: Reported on 02/15/2018 11/02/17   Minna Merritts, MD  sodium chloride 1 g tablet Take 1 g by mouth 2 (two) times daily with a meal.     [provider]    Allergies as of 02/14/2018 - Review Complete 02/14/2018  Allergen Reaction Noted  . Pimenta Nausea And Vomiting 11/04/2015  . Tomato  06/22/2017    Family History  Problem Relation Age of Onset  . Heart failure Mother   . Epilepsy Mother   . COPD Father   . Heart disease Father   . Anxiety disorder Sister   . Arthritis Brother   . Heart disease Brother   . Vaginal cancer Paternal Grandmother   . Heart attack Paternal Grandfather   . COPD Brother   . Kidney failure Brother   . COPD Brother   . Arthritis Sister   . Uterine cancer Unknown   . Diabetes Unknown   . Colon cancer Neg Hx   . Stomach cancer Neg Hx   . Breast cancer Neg Hx     Social History   Socioeconomic History  . Marital status: Married    Spouse name: Not on file  . Number of children: 2  . Years of education: Not on file  . Highest education level: Not on file  Occupational History  . Not on file  Social Needs  . Financial resource strain: Not on file  . Food insecurity:    Worry: Not on file    Inability: Not on file  . Transportation needs:    Medical:  Not on file    Non-medical: Not on file  Tobacco Use  . Smoking status: Former Smoker    Packs/day: 0.75    Years: 40.00    Pack years: 30.00    Types: Cigarettes    Last attempt to quit: 11/07/2003    Years since quitting: 14.2  . Smokeless tobacco: Never Used  Substance and Sexual Activity  . Alcohol use: Yes    Alcohol/week: 1.0 - 3.0 standard drinks    Types: 1 - 3 Glasses of wine per week    Comment: Occasionally.    Marland Kitchen  Drug use: No  . Sexual activity: Not on file  Lifestyle  . Physical activity:    Days per week: Not on file    Minutes per session: Not on file  . Stress: Not on file  Relationships  . Social connections:    Talks on phone: Not on file    Gets together: Not on file    Attends religious service: Not on file    Active member of club or organization: Not on file    Attends meetings of clubs or organizations: Not on file    Relationship status: Not on file  . Intimate partner violence:    Fear of current or ex partner: Not on file    Emotionally abused: Not on file    Physically abused: Not on file    Forced sexual activity: Not on file  Other Topics Concern  . Not on file  Social History Narrative   She drinks 1 to 2 caffeinated beverages a day    Review of Systems: See HPI, otherwise negative ROS  Physical Exam: BP (!) 160/62   Pulse (!) 52   Temp (!) 96.9 F (36.1 C) (Tympanic)   Resp 16   Ht 5\' 1"  (1.549 m)   Wt 53.1 kg   SpO2 99%   BMI 22.11 kg/m  General:   Alert,  pleasant and cooperative in NAD Head:  Normocephalic and atraumatic. Neck:  Supple; no masses or thyromegaly. Lungs:  Clear throughout to auscultation, normal respiratory effort.    Heart:  +S1, +S2, Regular rate and rhythm, No edema. Abdomen:  Soft, nontender and nondistended. Normal bowel sounds, without guarding, and without rebound.   Neurologic:  Alert and  oriented x4;  grossly normal neurologically.  Impression/Plan: Bianca Shaw is here for an endoscopy  to  be performed for  evaluation of gastric ulcers    Risks, benefits, limitations, and alternatives regarding endoscopy have been reviewed with the patient.  Questions have been answered.  All parties agreeable.   Jonathon Bellows, MD  02/15/2018, 11:20 AM

## 2018-02-15 NOTE — Op Note (Signed)
[facility] Gastroenterology Patient Name: Bianca Shaw Procedure Date: 02/15/2018 11:17 AM MRN: [id number] Account #: [id number] Date of Birth: Aug 25, 1945 Admit Type: Outpatient Age: 72 Room: [facility] ENDO ROOM 1 Gender: Female Note Status: Finalized Procedure:            Upper GI endoscopy Indications:          Follow-up of gastric ulcer with hemorrhage Providers:            Jonathon Bellows MD, MD Referring MD:         Sofie Hartigan (Referring MD) Medicines:            Monitored Anesthesia Care Complications:        No immediate complications. Procedure:            Pre-Anesthesia Assessment:                       - Prior to the procedure, a History and Physical was                        performed, and patient medications, allergies and                        sensitivities were reviewed. The patient's tolerance of                        previous anesthesia was reviewed.                       - The risks and benefits of the procedure and the                        sedation options and risks were discussed with the                        patient. All questions were answered and informed                        consent was obtained.                       - ASA Grade Assessment: II - A patient with mild                        systemic disease.                       After obtaining informed consent, the endoscope was                        passed under direct vision. Throughout the procedure,                        the patient's blood pressure, pulse, and oxygen                        saturations were monitored continuously. The Endoscope                        was introduced through the mouth, and advanced to the  third part of duodenum. The upper GI endoscopy was                        accomplished with ease. The patient tolerated the                        procedure well. Findings:      The esophagus was normal.      One non-bleeding  superficial duodenal ulcer with a clean ulcer base       (Forrest Class III) was found in the duodenal bulb. The lesion was 6 mm       in largest dimension. Biopsies were taken with a cold forceps for       histology.      [Extent] mild mucosal changes characterized by white plaques were found       in the gastric antrum. Biopsies were taken with a cold forceps for       histology.      Diffuse nodular mucosa was found on the greater curvature of the gastric       antrum. Biopsies were taken with a cold forceps for histology. This was       location of prior large ulcers , no ulcers seen today      The cardia and gastric fundus were normal on retroflexion. Impression:           - Normal esophagus.                       - One non-bleeding duodenal ulcer with a clean ulcer                        base (Forrest Class III). Biopsied.                       - Mucosal changes in the antrum. Biopsied.                       - Nodular mucosa in the gastric antrum (greater                        curvature). Biopsied. Recommendation:       - Discharge patient to home (with escort).                       - Resume previous diet.                       - Continue present medications.                       - Await pathology results.                       - Return to GI office as previously scheduled. Procedure Code(s):    --- Professional ---                       [phone], Esophagogastroduodenoscopy, flexible, transoral;                        with biopsy, single or multiple Diagnosis Code(s):    --- Professional ---  K26.9, Duodenal ulcer, unspecified as acute or chronic,                        without hemorrhage or perforation                       K31.89, Other diseases of stomach and duodenum                       K25.4, Chronic or unspecified gastric ulcer with                        hemorrhage CPT copyright 2018 American Medical Association. All rights reserved. The codes  documented in this report are preliminary and upon coder review may  be revised to meet current compliance requirements. Jonathon Bellows, MD Jonathon Bellows MD, MD 02/15/2018 11:38:16 AM This report has been signed electronically. Number of Addenda: 0 Note Initiated On: 02/15/2018 11:17 AM      [facility]

## 2018-02-15 NOTE — Anesthesia Postprocedure Evaluation (Signed)
Anesthesia Post Note  Patient: Bianca Shaw  Procedure(s) Performed: ESOPHAGOGASTRODUODENOSCOPY (EGD) WITH PROPOFOL (N/A )  Patient location during evaluation: PACU Anesthesia Type: General Level of consciousness: awake and alert Pain management: pain level controlled Vital Signs Assessment: post-procedure vital signs reviewed and stable Respiratory status: spontaneous breathing, nonlabored ventilation, respiratory function stable and patient connected to nasal cannula oxygen Cardiovascular status: blood pressure returned to baseline and stable Postop Assessment: no apparent nausea or vomiting Anesthetic complications: no     Last Vitals:  Vitals:   02/15/18 1017  BP: (!) 160/62  Pulse: (!) 52  Resp: 16  Temp: (!) 36.1 C  SpO2: 99%    Last Pain:  Vitals:   02/15/18 1017  TempSrc: Tympanic  PainSc: 0-No pain                 Molli Barrows

## 2018-02-15 NOTE — Anesthesia Post-op Follow-up Note (Signed)
Anesthesia QCDR form completed.        

## 2018-02-16 ENCOUNTER — Encounter: Payer: Self-pay | Admitting: Gastroenterology

## 2018-02-16 DIAGNOSIS — R809 Proteinuria, unspecified: Secondary | ICD-10-CM | POA: Diagnosis not present

## 2018-02-16 DIAGNOSIS — E871 Hypo-osmolality and hyponatremia: Secondary | ICD-10-CM | POA: Diagnosis not present

## 2018-02-16 DIAGNOSIS — I10 Essential (primary) hypertension: Secondary | ICD-10-CM | POA: Diagnosis not present

## 2018-02-21 DIAGNOSIS — E871 Hypo-osmolality and hyponatremia: Secondary | ICD-10-CM | POA: Diagnosis not present

## 2018-02-21 DIAGNOSIS — N183 Chronic kidney disease, stage 3 (moderate): Secondary | ICD-10-CM | POA: Diagnosis not present

## 2018-02-21 DIAGNOSIS — R809 Proteinuria, unspecified: Secondary | ICD-10-CM | POA: Diagnosis not present

## 2018-02-21 DIAGNOSIS — I10 Essential (primary) hypertension: Secondary | ICD-10-CM | POA: Diagnosis not present

## 2018-02-21 DIAGNOSIS — D631 Anemia in chronic kidney disease: Secondary | ICD-10-CM | POA: Diagnosis not present

## 2018-02-22 ENCOUNTER — Encounter: Payer: Self-pay | Admitting: Emergency Medicine

## 2018-02-22 ENCOUNTER — Inpatient Hospital Stay: Payer: PPO

## 2018-02-22 ENCOUNTER — Other Ambulatory Visit: Payer: Self-pay

## 2018-02-22 ENCOUNTER — Inpatient Hospital Stay
Admission: EM | Admit: 2018-02-22 | Discharge: 2018-02-23 | DRG: 683 | Disposition: A | Discharge status: Home / Self Care | Payer: PPO | Attending: Internal Medicine | Admitting: Internal Medicine

## 2018-02-22 DIAGNOSIS — D631 Anemia in chronic kidney disease: Secondary | ICD-10-CM | POA: Diagnosis present

## 2018-02-22 DIAGNOSIS — Z79899 Other long term (current) drug therapy: Secondary | ICD-10-CM

## 2018-02-22 DIAGNOSIS — R5383 Other fatigue: Secondary | ICD-10-CM | POA: Diagnosis not present

## 2018-02-22 DIAGNOSIS — N179 Acute kidney failure, unspecified: Secondary | ICD-10-CM | POA: Diagnosis not present

## 2018-02-22 DIAGNOSIS — Z7982 Long term (current) use of aspirin: Secondary | ICD-10-CM | POA: Diagnosis not present

## 2018-02-22 DIAGNOSIS — T501X5A Adverse effect of loop [high-ceiling] diuretics, initial encounter: Secondary | ICD-10-CM | POA: Diagnosis present

## 2018-02-22 DIAGNOSIS — F419 Anxiety disorder, unspecified: Secondary | ICD-10-CM | POA: Diagnosis present

## 2018-02-22 DIAGNOSIS — M810 Age-related osteoporosis without current pathological fracture: Secondary | ICD-10-CM | POA: Diagnosis present

## 2018-02-22 DIAGNOSIS — Z170 Estrogen receptor positive status [ER+]: Secondary | ICD-10-CM | POA: Diagnosis not present

## 2018-02-22 DIAGNOSIS — N183 Chronic kidney disease, stage 3 (moderate): Secondary | ICD-10-CM | POA: Diagnosis present

## 2018-02-22 DIAGNOSIS — Z87891 Personal history of nicotine dependence: Secondary | ICD-10-CM | POA: Diagnosis not present

## 2018-02-22 DIAGNOSIS — E876 Hypokalemia: Secondary | ICD-10-CM | POA: Diagnosis present

## 2018-02-22 DIAGNOSIS — E785 Hyperlipidemia, unspecified: Secondary | ICD-10-CM | POA: Diagnosis not present

## 2018-02-22 DIAGNOSIS — I129 Hypertensive chronic kidney disease with stage 1 through stage 4 chronic kidney disease, or unspecified chronic kidney disease: Secondary | ICD-10-CM | POA: Diagnosis present

## 2018-02-22 DIAGNOSIS — Z9012 Acquired absence of left breast and nipple: Secondary | ICD-10-CM | POA: Diagnosis not present

## 2018-02-22 DIAGNOSIS — E860 Dehydration: Secondary | ICD-10-CM | POA: Diagnosis present

## 2018-02-22 DIAGNOSIS — K219 Gastro-esophageal reflux disease without esophagitis: Secondary | ICD-10-CM | POA: Diagnosis not present

## 2018-02-22 DIAGNOSIS — Z853 Personal history of malignant neoplasm of breast: Secondary | ICD-10-CM | POA: Diagnosis not present

## 2018-02-22 DIAGNOSIS — Z7951 Long term (current) use of inhaled steroids: Secondary | ICD-10-CM

## 2018-02-22 DIAGNOSIS — E871 Hypo-osmolality and hyponatremia: Secondary | ICD-10-CM | POA: Diagnosis present

## 2018-02-22 DIAGNOSIS — C50412 Malignant neoplasm of upper-outer quadrant of left female breast: Secondary | ICD-10-CM

## 2018-02-22 DIAGNOSIS — R809 Proteinuria, unspecified: Secondary | ICD-10-CM | POA: Diagnosis not present

## 2018-02-22 DIAGNOSIS — I10 Essential (primary) hypertension: Secondary | ICD-10-CM | POA: Diagnosis not present

## 2018-02-22 LAB — COMPREHENSIVE METABOLIC PANEL
ALBUMIN: 3.5 g/dL (ref 3.5–5.0)
ALT: 14 U/L (ref 0–44)
AST: 21 U/L (ref 15–41)
Alkaline Phosphatase: 48 U/L (ref 38–126)
Anion gap: 7 (ref 5–15)
BUN: 34 mg/dL — ABNORMAL HIGH (ref 8–23)
CHLORIDE: 96 mmol/L — AB (ref 98–111)
CO2: 28 mmol/L (ref 22–32)
CREATININE: 2.3 mg/dL — AB (ref 0.44–1.00)
Calcium: 13.3 mg/dL (ref 8.9–10.3)
GFR calc non Af Amer: 20 mL/min — ABNORMAL LOW (ref >60)
GFR, EST AFRICAN AMERICAN: 23 mL/min — AB (ref >60)
GLUCOSE: 108 mg/dL — AB (ref 70–99)
Potassium: 4.2 mmol/L (ref 3.5–5.1)
SODIUM: 131 mmol/L — AB (ref 135–145)
Total Bilirubin: 0.3 mg/dL (ref 0.3–1.2)
Total Protein: 8.3 g/dL — ABNORMAL HIGH (ref 6.5–8.1)

## 2018-02-22 LAB — URINALYSIS, COMPLETE (UACMP) WITH MICROSCOPIC
Bacteria, UA: NONE SEEN
Bilirubin Urine: NEGATIVE
GLUCOSE, UA: 50 mg/dL — AB
Ketones, ur: NEGATIVE mg/dL
Leukocytes, UA: NEGATIVE
Nitrite: NEGATIVE
PH: 8 (ref 5.0–8.0)
Protein, ur: NEGATIVE mg/dL
SPECIFIC GRAVITY, URINE: 1.003 — AB (ref 1.005–1.030)

## 2018-02-22 LAB — CBC WITH DIFFERENTIAL/PLATELET
Abs Immature Granulocytes: 0.02 10*3/uL (ref 0.00–0.07)
Basophils Absolute: 0.1 10*3/uL (ref 0.0–0.1)
Basophils Relative: 1 %
Eosinophils Absolute: 0.1 10*3/uL (ref 0.0–0.5)
Eosinophils Relative: 1 %
HCT: 31.3 % — ABNORMAL LOW (ref 36.0–46.0)
Hemoglobin: 10.2 g/dL — ABNORMAL LOW (ref 12.0–15.0)
Immature Granulocytes: 0 %
Lymphocytes Relative: 10 %
Lymphs Abs: 0.6 10*3/uL — ABNORMAL LOW (ref 0.7–4.0)
MCH: 29.7 pg (ref 26.0–34.0)
MCHC: 32.6 g/dL (ref 30.0–36.0)
MCV: 91 fL (ref 80.0–100.0)
Monocytes Absolute: 1.1 10*3/uL — ABNORMAL HIGH (ref 0.1–1.0)
Monocytes Relative: 19 %
Neutro Abs: 4.3 10*3/uL (ref 1.7–7.7)
Neutrophils Relative %: 69 %
Platelets: 278 10*3/uL (ref 150–400)
RBC: 3.44 MIL/uL — ABNORMAL LOW (ref 3.87–5.11)
RDW: 13.7 % (ref 11.5–15.5)
WBC: 6.1 10*3/uL (ref 4.0–10.5)
nRBC: 0 % (ref 0.0–0.2)

## 2018-02-22 LAB — CBC
HCT: 31.7 % — ABNORMAL LOW (ref 36.0–46.0)
Hemoglobin: 10.4 g/dL — ABNORMAL LOW (ref 12.0–15.0)
MCH: 30 pg (ref 26.0–34.0)
MCHC: 32.8 g/dL (ref 30.0–36.0)
MCV: 91.4 fL (ref 80.0–100.0)
NRBC: 0 % (ref 0.0–0.2)
PLATELETS: 333 10*3/uL (ref 150–400)
RBC: 3.47 MIL/uL — AB (ref 3.87–5.11)
RDW: 13.8 % (ref 11.5–15.5)
WBC: 8.5 10*3/uL (ref 4.0–10.5)

## 2018-02-22 LAB — SURGICAL PATHOLOGY

## 2018-02-22 MED ORDER — SODIUM CHLORIDE 0.9 % IV BOLUS
1000.0000 mL | Freq: Once | INTRAVENOUS | Status: AC
  Administered 2018-02-22: 1000 mL via INTRAVENOUS

## 2018-02-22 MED ORDER — FLUTICASONE PROPIONATE 50 MCG/ACT NA SUSP
2.0000 | Freq: Every day | NASAL | Status: DC
  Filled 2018-02-22: qty 16

## 2018-02-22 MED ORDER — SODIUM CHLORIDE 1 G PO TABS
1.0000 g | ORAL_TABLET | Freq: Two times a day (BID) | ORAL | Status: DC
  Administered 2018-02-23: 1 g via ORAL
  Filled 2018-02-22 (×2): qty 1

## 2018-02-22 MED ORDER — SODIUM CHLORIDE 0.9 % IV SOLN
INTRAVENOUS | Status: DC
  Administered 2018-02-22: 1000 mL via INTRAVENOUS
  Administered 2018-02-23: 05:00:00 via INTRAVENOUS

## 2018-02-22 MED ORDER — NITROGLYCERIN 0.4 MG SL SUBL: 0.4000 mg | SUBLINGUAL_TABLET | SUBLINGUAL | Status: DC | PRN

## 2018-02-22 MED ORDER — ALBUTEROL SULFATE (2.5 MG/3ML) 0.083% IN NEBU: 2.5000 mg | INHALATION_SOLUTION | Freq: Four times a day (QID) | RESPIRATORY_TRACT | Status: DC | PRN

## 2018-02-22 MED ORDER — HYDRALAZINE HCL 20 MG/ML IJ SOLN: 10.0000 mg | Freq: Four times a day (QID) | INTRAMUSCULAR | Status: DC | PRN

## 2018-02-22 MED ORDER — POTASSIUM CHLORIDE CRYS ER 20 MEQ PO TBCR: 20.0000 meq | EXTENDED_RELEASE_TABLET | Freq: Every day | ORAL | Status: DC

## 2018-02-22 MED ORDER — ASPIRIN EC 81 MG PO TBEC
81.0000 mg | DELAYED_RELEASE_TABLET | Freq: Every day | ORAL | Status: DC
Start: 2018-02-22 — End: 2018-02-23
  Administered 2018-02-23: 81 mg via ORAL
  Filled 2018-02-22: qty 1

## 2018-02-22 MED ORDER — ONDANSETRON HCL 4 MG PO TABS: 4.0000 mg | ORAL_TABLET | Freq: Four times a day (QID) | ORAL | Status: DC | PRN

## 2018-02-22 MED ORDER — SODIUM CHLORIDE 0.9 % IV BOLUS
500.0000 mL | Freq: Once | INTRAVENOUS | Status: AC
  Administered 2018-02-22: 500 mL via INTRAVENOUS

## 2018-02-22 MED ORDER — POLYETHYLENE GLYCOL 3350 17 G PO PACK: 17.0000 g | PACK | Freq: Every day | ORAL | Status: DC | PRN

## 2018-02-22 MED ORDER — HYDROCODONE-ACETAMINOPHEN 5-325 MG PO TABS: 1.0000 | ORAL_TABLET | ORAL | Status: DC | PRN

## 2018-02-22 MED ORDER — HYDRALAZINE HCL 25 MG PO TABS
25.0000 mg | ORAL_TABLET | Freq: Two times a day (BID) | ORAL | Status: DC
  Administered 2018-02-22 – 2018-02-23 (×2): 25 mg via ORAL
  Filled 2018-02-22 (×2): qty 1

## 2018-02-22 MED ORDER — HEPARIN SODIUM (PORCINE) 5000 UNIT/ML IJ SOLN
5000.0000 [IU] | Freq: Three times a day (TID) | INTRAMUSCULAR | Status: DC
  Administered 2018-02-22 – 2018-02-23 (×2): 5000 [IU] via SUBCUTANEOUS
  Filled 2018-02-22 (×2): qty 1

## 2018-02-22 MED ORDER — METOPROLOL SUCCINATE ER 25 MG PO TB24: 25.0000 mg | ORAL_TABLET | Freq: Every day | ORAL | Status: DC

## 2018-02-22 MED ORDER — ACETAMINOPHEN 650 MG RE SUPP: 650.0000 mg | Freq: Four times a day (QID) | RECTAL | Status: DC | PRN

## 2018-02-22 MED ORDER — ACETAMINOPHEN 325 MG PO TABS: 650.0000 mg | ORAL_TABLET | Freq: Four times a day (QID) | ORAL | Status: DC | PRN

## 2018-02-22 MED ORDER — PANTOPRAZOLE SODIUM 40 MG PO TBEC
40.0000 mg | DELAYED_RELEASE_TABLET | Freq: Every day | ORAL | Status: DC
  Administered 2018-02-23: 40 mg via ORAL
  Filled 2018-02-22: qty 1

## 2018-02-22 MED ORDER — ONDANSETRON HCL 4 MG/2ML IJ SOLN: 4.0000 mg | Freq: Four times a day (QID) | INTRAMUSCULAR | Status: DC | PRN

## 2018-02-22 MED ORDER — TAMOXIFEN CITRATE 10 MG PO TABS
20.0000 mg | ORAL_TABLET | Freq: Every day | ORAL | Status: DC
  Administered 2018-02-23: 20 mg via ORAL
  Filled 2018-02-22 (×2): qty 2

## 2018-02-22 NOTE — ED Notes (Signed)
Report was called and given to the floor at this time

## 2018-02-22 NOTE — ED Provider Notes (Signed)
[facility] Emergency Department Provider Note   ____________________________________________   First MD Initiated Contact with Patient 02/22/18 1909     (approximate)  I have reviewed the triage vital signs and the nursing notes.   HISTORY  Chief Complaint Abnormal Lab    HPI Bianca Shaw is a 72 y.o. female presents for evaluation of fatigue  Patient husband reports she was told to come in for abnormal calcium level.  She saw nephrology yesterday, they did start her on prednisone and said that her chest x-ray showed a possible small "spot" was also started on prednisone  Husband reports she has had previous problems with high calcium as well as low sodium  She is to limit her water intake but was told that she could drink more water yesterday  She does report feeling fatigued, muscles feel weak and all over.  No headache.  No nausea vomiting.  No fevers.  Reports she started feeling a little chilled after starting to get IV fluids here  Past Medical History:  Diagnosis Date  . Anemia   . Anxiety disorder   . Breast cancer of upper-outer quadrant of left female breast ([place]) 11/2015   pT2 pN0(i+).;ER+; PR +, her 2 neu not overexpressed.  Mastectomy, SLN, Mammoprint: Low risk.   . Cancer ([place]) 12/03/2015   left breast/ INVASIVE LOBULAR CARCINOMA.   . Cough    lingering, mild, finished Prednisone and anitbiotic 11/03/15  . Family history of adverse reaction to anesthesia    sister - PONV  . GERD (gastroesophageal reflux disease)   . Hypertension   . Osteopenia   . Osteoporosis   . Pericarditis    diagnonsed June, 2010, unclear etiology as of yer  . Personal history of tobacco use, presenting hazards to health 10/31/2015  . Scleroderma ([facility])    ONLY ON SKIN-MILD  . UTI (lower urinary tract infection)   . Wears dentures    full upper    Patient Active Problem List   Diagnosis Date Noted  . Stomach irritation   . Acute peptic ulcer of  stomach   . Abdominal pain, epigastric   . Intractable vomiting with nausea   . Anemia associated with chronic renal failure 07/20/2017  . 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 Localized morphea 07/05/2017  . Goals of care, counseling/discussion 06/29/2017  . Hypercalcemia 06/22/2017  . Gait abnormality 06/16/2017  . Weakness 06/16/2017  . Low vitamin B12 level 04/09/2017  . Mass of left chest wall 12/15/2016  . Elevated troponin 07/16/2016  . Non-ST elevation (NSTEMI) myocardial infarction ([place]) 07/16/2016  . Hypokalemia 07/16/2016  . Gastroenteritis 07/16/2016  . Dyspnea   . Coronary artery disease involving native coronary artery of native heart with unstable angina pectoris ([place])   . Hematoma (nontraumatic) of breast 01/13/2016  . Scleroderma ([place]) 12/12/2015  . Breast cancer of upper-outer quadrant of left female breast ([place]) 12/06/2015  . Special screening for malignant neoplasms, colon   . Personal history of tobacco use, presenting hazards to health 10/31/2015  . Anxiety 05/23/2015  . CAFL (chronic airflow limitation) ([place]) 05/23/2015  . Bloodgood disease 05/23/2015  . Essential (primary) hypertension 05/23/2015  . Acid reflux 05/23/2015  . Hypercholesteremia 05/23/2015  . Hyponatremia 05/23/2015  . Osteoporosis 05/23/2015  . Allergic rhinitis, seasonal 05/23/2015  . UNSPECIFIED ANEMIA 10/03/2008    Past Surgical History:  Procedure Laterality Date  . BREAST BIOPSY Right 2012   core - neg  . BREAST BIOPSY Left 12/03/2015  INVASIVE LOBULAR CARCINOMA.   Marland Kitchen CARDIAC CATHETERIZATION    . CATARACT EXTRACTION W/ INTRAOCULAR LENS IMPLANT Right   . COLONOSCOPY WITH PROPOFOL N/A 11/08/2015   Procedure: COLONOSCOPY WITH PROPOFOL;  Surgeon: Lucilla Lame, MD;  Location: [place];  Service: Endoscopy;  Laterality: N/A;  . ESOPHAGOGASTRODUODENOSCOPY (EGD) WITH PROPOFOL N/A 11/18/2017   Procedure: ESOPHAGOGASTRODUODENOSCOPY (EGD) WITH PROPOFOL;  Surgeon: Virgel Manifold, MD;  Location: [facility] ENDOSCOPY;  Service: Endoscopy;  Laterality: N/A;  . ESOPHAGOGASTRODUODENOSCOPY (EGD) WITH PROPOFOL N/A 02/15/2018   Procedure: ESOPHAGOGASTRODUODENOSCOPY (EGD) WITH PROPOFOL;  Surgeon: Jonathon Bellows, MD;  Location: [facility] ENDOSCOPY;  Service: Gastroenterology;  Laterality: N/A;  . EVACUATION BREAST HEMATOMA Left 01/14/2016   Procedure: EVACUATION HEMATOMA BREAST;  Surgeon: Robert Bellow, MD;  Location: [facility] ORS;  Service: General;  Laterality: Left;  . EYE SURGERY    . MASTECTOMY Left 2017   complete mastectomy  . MASTECTOMY W/ SENTINEL NODE BIOPSY Left 12/26/2015   Procedure: MASTECTOMY WITH SENTINEL LYMPH NODE BIOPSY;  Surgeon: Robert Bellow, MD;  Location: [facility] ORS;  Service: General;  Laterality: Left;  . RIGHT/LEFT HEART CATH AND CORONARY ANGIOGRAPHY N/A 07/22/2016   Procedure: Right/Left Heart Cath and Coronary Angiography;  Surgeon: Minna Merritts, MD;  Location: [place] CV LAB;  Service: Cardiovascular;  Laterality: N/A;  . TUBAL LIGATION    . VESICOVAGINAL FISTULA CLOSURE W/ TAH      Prior to Admission medications   Medication Sig Start Date End Date Taking? Authorizing Provider  albuterol (PROVENTIL) (2.5 MG/3ML) 0.083% nebulizer solution Take 3 mLs (2.5 mg total) by nebulization every 6 (six) hours as needed for wheezing or shortness of breath. 06/06/15   Mar Daring, PA-C  aspirin EC 81 MG tablet Take 81 mg by mouth daily.    [provider]  diazepam (VALIUM) 5 MG tablet Take by mouth.  11/08/17   [provider]  fluticasone (FLONASE) 50 MCG/ACT nasal spray Place 2 sprays into the nose daily as needed for allergies or rhinitis.     [provider]  furosemide (LASIX) 20 MG tablet Take 20 mg by mouth daily.  11/08/17   [provider]  hydrALAZINE (APRESOLINE) 25 MG tablet Take 1 tablet (25 mg total) by mouth 2 (two) times daily. 04/29/17   Jerrol Banana., MD  lisinopril (PRINIVIL,ZESTRIL) 20  MG tablet Take 1 tablet (20 mg total) by mouth daily. 06/26/17   Gladstone Lighter, MD  metoprolol succinate (TOPROL-XL) 25 MG 24 hr tablet Take 1 tablet (25 mg total) by mouth daily. 05/24/17   Jerrol Banana., MD  nitroGLYCERIN (NITROSTAT) 0.4 MG SL tablet Place 1 tablet (0.4 mg total) under the tongue every 5 (five) minutes as needed for chest pain. 07/17/16   Bettey Costa, MD  ondansetron (ZOFRAN) 4 MG tablet Take 1 tablet (4 mg total) by mouth every 8 (eight) hours as needed for nausea or vomiting. 12/14/17   Lequita Asal, MD  pantoprazole (PROTONIX) 40 MG tablet Take 1 tablet (40 mg total) by mouth 2 (two) times daily before a meal. Patient not taking: Reported on 02/15/2018 01/19/18   Virgel Manifold, MD  potassium chloride SA (K-DUR,KLOR-CON) 20 MEQ tablet Take 1 tablet (20 mEq total) by mouth daily. 06/12/17   Vaughan Basta, MD  simvastatin (ZOCOR) 40 MG tablet Take 1 tablet (40 mg total) by mouth daily. Patient not taking: Reported on 02/15/2018 11/02/17   Minna Merritts, MD  sodium chloride 1 g  tablet Take 1 g by mouth 2 (two) times daily with a meal.     [provider]  tamoxifen (NOLVADEX) 20 MG tablet Take 1 tablet (20 mg total) by mouth daily. 08/02/17   Lequita Asal, MD    Allergies Pimenta and Tomato  Family History  Problem Relation Age of Onset  . Heart failure Mother   . Epilepsy Mother   . COPD Father   . Heart disease Father   . Anxiety disorder Sister   . Arthritis Brother   . Heart disease Brother   . Vaginal cancer Paternal Grandmother   . Heart attack Paternal Grandfather   . COPD Brother   . Kidney failure Brother   . COPD Brother   . Arthritis Sister   . Uterine cancer Unknown   . Diabetes Unknown   . Colon cancer Neg Hx   . Stomach cancer Neg Hx   . Breast cancer Neg Hx     Social History Social History   Tobacco Use  . Smoking status: Former Smoker    Packs/day: 0.75    Years: 40.00    Pack years: 30.00     Types: Cigarettes    Last attempt to quit: 11/07/2003    Years since quitting: 14.3  . Smokeless tobacco: Never Used  Substance Use Topics  . Alcohol use: Yes    Alcohol/week: 1.0 - 3.0 standard drinks    Types: 1 - 3 Glasses of wine per week    Comment: Occasionally.    . Drug use: No    Review of Systems Constitutional: No fever but more fatigued and generalized feeling of weakness in her muscles Eyes: No visual changes. ENT: No sore throat. Cardiovascular: Denies chest pain. Respiratory: Denies shortness of breath. Gastrointestinal: No abdominal pain.   Genitourinary: Negative for dysuria. Musculoskeletal: Negative for back pain. Skin: Negative for rash. Neurological: Negative for headaches, areas of focal weakness or numbness.    ____________________________________________   PHYSICAL EXAM:  VITAL SIGNS: ED Triage Vitals  Enc Vitals Group     BP 02/22/18 1735 (!) 182/76     Pulse Rate 02/22/18 1735 (!) 56     Resp 02/22/18 1735 16     Temp 02/22/18 1735 97.9 F (36.6 C)     Temp Source 02/22/18 1735 Oral     SpO2 02/22/18 1735 97 %     Weight 02/22/18 1736 116 lb 13.5 oz (53 kg)     Height 02/22/18 1736 5\' 1"  (1.549 m)     Head Circumference --      Peak Flow --      Pain Score 02/22/18 1736 0     Pain Loc --      Pain Edu? --      Excl. in [place]? --     Constitutional: Alert and oriented. Well appearing and in no acute distress.  Does appear mildly ill, generally fatigued without focal deficit. Eyes: Conjunctivae are normal. Head: Atraumatic. Nose: No congestion/rhinnorhea. Mouth/Throat: Mucous membranes are dry. Neck: No stridor.  Cardiovascular: Normal rate, regular rhythm. Grossly normal heart sounds.  Good peripheral circulation. Respiratory: Normal respiratory effort.  No retractions. Lungs CTAB. Gastrointestinal: Soft and nontender. No distention. Musculoskeletal: No lower extremity tenderness nor edema.  Moves all extremities with good strength,  but does appear just generally fatigued. Neurologic:  Normal speech and language. No gross focal neurologic deficits are appreciated.  Skin:  Skin is warm, dry and intact. No rash noted. Psychiatric: Mood and affect  are normal. Speech and behavior are normal.  ____________________________________________   LABS (all labs ordered are listed, but only abnormal results are displayed)  Labs Reviewed  CBC - Abnormal; Notable for the following components:      Result Value   RBC 3.47 (*)    Hemoglobin 10.4 (*)    HCT 31.7 (*)    All other components within normal limits  COMPREHENSIVE METABOLIC PANEL - Abnormal; Notable for the following components:   Sodium 131 (*)    Chloride 96 (*)    Glucose, Bld 108 (*)    BUN 34 (*)    Creatinine, Ser 2.30 (*)    Calcium 13.3 (*)    Total Protein 8.3 (*)    GFR calc non Af Amer 20 (*)    GFR calc Af Amer 23 (*)    All other components within normal limits  CBC  CREATININE, SERUM  BASIC METABOLIC PANEL  CBC  TSH  PARATHYROID HORMONE, INTACT (NO CA)   ____________________________________________  EKG  Reviewed and interpreted at 1742 Heart rate 69 QRS 90 QTc 410 Slight sinus bradycardia, some slight artifact is felt to be present, appears  Sinus.  No ischemic abnormalities ____________________________________________  RADIOLOGY  No results found.   ____________________________________________   PROCEDURES  Procedure(s) performed: None  Procedures  Critical Care performed: No  ____________________________________________   INITIAL IMPRESSION / ASSESSMENT AND PLAN / ED COURSE  Pertinent labs & imaging results that were available during my care of the patient were reviewed by me and considered in my medical decision making (see chart for details).   Patient presents for fatigue and elevated calcium level.  Differential diagnosis is broad, it appears she has already started her evaluation through nephrology service.  Her  fatigue and muscle weakness seem consistent with hypercalcemia.  Her renal function is reduced, suspect this may be contributed to in some by some dehydration we will hydrate her generously.  She shows no hemodynamic instability, is awake and alert at this time.    Discussed with Dr. Abigail Butts, referred for evaluation of hypercalcemia... Etiology is not known. Dr. Holley Raring called Dr. Mike Gip today, referred for worsening GFR and hypercalecmia. Advises IV hydration, needs admission for IV fluids and follow-up on hypercalcemia. Dr. Abigail Butts will see in consult tomorrow AM. Avoid thiazide, no lasix or other medication advised at present.   ----------------------------------------- 7:41 PM on 02/22/2018 -----------------------------------------  Case discussed with hospitalist Dr. Benjie Karvonen.  Patient will be admitted for further evaluation with internal medicine service and consult with her known nephrology service  ____________________________________________   FINAL CLINICAL IMPRESSION(S) / ED DIAGNOSES  Final diagnoses:  Hypercalcemia  AKI (acute kidney injury) ([place])        Note:  This document was prepared using Dragon voice recognition software and may include unintentional dictation errors       Delman Kitten, MD 02/22/18 1941

## 2018-02-22 NOTE — Progress Notes (Signed)
Family Meeting Note  Advance Directive:yes  Today a meeting took place with the Patient.spouse  The following clinical team members were present during this meeting:MD  The following were discussed:Patient's diagnosis:AKI, hyponatremia, hypercalcemia, Patient's progosis: > 12 months and Goals for treatment: Full Code  Additional follow-up to be provided: FULL CODE No new changes  Time spent during discussion:16 minutes  Naydeline Morace, MD

## 2018-02-22 NOTE — H&P (Signed)
[place] [facility] [place] NAME: Bianca Shaw    MR#:  [id number]  DATE OF BIRTH:  Dec 06, 1945  DATE OF ADMISSION:  02/22/2018  PRIMARY CARE PHYSICIAN: Sofie Hartigan, MD    REQUESTING/REFERRING PHYSICIAN: dr Jacqualine Code  CHIEF COMPLAINT:   Sent from Dr. Leonidas Romberg office for abnormal labs HISTORY OF PRESENT ILLNESS:  Bianca Shaw  is a 72 y.o. female with a known history of breast cancer, anemia of chronic disease and essential hypertension who presents to the ER after receiving a call by his nephrologist for increased calcium level and increased kidney function. Apparently patient has had increased calcium levels in the past. She reports no nausea, vomiting however does feel fatigue and generalized weakness.   PAST MEDICAL HISTORY:   Past Medical History:  Diagnosis Date  . Anemia   . Anxiety disorder   . Breast cancer of upper-outer quadrant of left female breast ([place]) 11/2015   pT2 pN0(i+).;ER+; PR +, her 2 neu not overexpressed.  Mastectomy, SLN, Mammoprint: Low risk.   . Cancer ([place]) 12/03/2015   left breast/ INVASIVE LOBULAR CARCINOMA.   . Cough    lingering, mild, finished Prednisone and anitbiotic 11/03/15  . Family history of adverse reaction to anesthesia    sister - PONV  . GERD (gastroesophageal reflux disease)   . Hypertension   . Osteopenia   . Osteoporosis   . Pericarditis    diagnonsed June, 2010, unclear etiology as of yer  . Personal history of tobacco use, presenting hazards to health 10/31/2015  . Scleroderma ([facility])    ONLY ON SKIN-MILD  . UTI (lower urinary tract infection)   . Wears dentures    full upper    PAST SURGICAL HISTORY:   Past Surgical History:  Procedure Laterality Date  . BREAST BIOPSY Right 2012   core - neg  . BREAST BIOPSY Left 12/03/2015   INVASIVE LOBULAR CARCINOMA.   Marland Kitchen CARDIAC CATHETERIZATION    . CATARACT EXTRACTION W/ INTRAOCULAR LENS IMPLANT Right   . COLONOSCOPY WITH PROPOFOL N/A  11/08/2015   Procedure: COLONOSCOPY WITH PROPOFOL;  Surgeon: Lucilla Lame, MD;  Location: [facility];  Service: Endoscopy;  Laterality: N/A;  . ESOPHAGOGASTRODUODENOSCOPY (EGD) WITH PROPOFOL N/A 11/18/2017   Procedure: ESOPHAGOGASTRODUODENOSCOPY (EGD) WITH PROPOFOL;  Surgeon: Virgel Manifold, MD;  Location: [facility] ENDOSCOPY;  Service: Endoscopy;  Laterality: N/A;  . ESOPHAGOGASTRODUODENOSCOPY (EGD) WITH PROPOFOL N/A 02/15/2018   Procedure: ESOPHAGOGASTRODUODENOSCOPY (EGD) WITH PROPOFOL;  Surgeon: Jonathon Bellows, MD;  Location: [facility] ENDOSCOPY;  Service: Gastroenterology;  Laterality: N/A;  . EVACUATION BREAST HEMATOMA Left 01/14/2016   Procedure: EVACUATION HEMATOMA BREAST;  Surgeon: Robert Bellow, MD;  Location: [facility] ORS;  Service: General;  Laterality: Left;  . EYE SURGERY    . MASTECTOMY Left 2017   complete mastectomy  . MASTECTOMY W/ SENTINEL NODE BIOPSY Left 12/26/2015   Procedure: MASTECTOMY WITH SENTINEL LYMPH NODE BIOPSY;  Surgeon: Robert Bellow, MD;  Location: [facility] ORS;  Service: General;  Laterality: Left;  . RIGHT/LEFT HEART CATH AND CORONARY ANGIOGRAPHY N/A 07/22/2016   Procedure: Right/Left Heart Cath and Coronary Angiography;  Surgeon: Minna Merritts, MD;  Location: [facility];  Service: Cardiovascular;  Laterality: N/A;  . TUBAL LIGATION    . VESICOVAGINAL FISTULA CLOSURE W/ TAH      SOCIAL HISTORY:   Social History   Tobacco Use  . Smoking status: Former Smoker    Packs/day: 0.75    Years: 40.00  Pack years: 30.00    Types: Cigarettes    Last attempt to quit: 11/07/2003    Years since quitting: 14.3  . Smokeless tobacco: Never Used  Substance Use Topics  . Alcohol use: Yes    Alcohol/week: 1.0 - 3.0 standard drinks    Types: 1 - 3 Glasses of wine per week    Comment: Occasionally.      FAMILY HISTORY:   Family History  Problem Relation Age of Onset  . Heart failure Mother   . Epilepsy Mother   . COPD Father   . Heart disease Father    . Anxiety disorder Sister   . Arthritis Brother   . Heart disease Brother   . Vaginal cancer Paternal Grandmother   . Heart attack Paternal Grandfather   . COPD Brother   . Kidney failure Brother   . COPD Brother   . Arthritis Sister   . Uterine cancer Unknown   . Diabetes Unknown   . Colon cancer Neg Hx   . Stomach cancer Neg Hx   . Breast cancer Neg Hx     DRUG ALLERGIES:   Allergies  Allergen Reactions  . Pimenta Nausea And Vomiting  . Tomato     REVIEW OF SYSTEMS:   Review of Systems  Constitutional: Positive for malaise/fatigue. Negative for chills and fever.  HENT: Negative.  Negative for ear discharge, ear pain, hearing loss, nosebleeds and sore throat.   Eyes: Negative.  Negative for blurred vision and pain.  Respiratory: Negative.  Negative for cough, hemoptysis, shortness of breath and wheezing.   Cardiovascular: Negative.  Negative for chest pain, palpitations and leg swelling.  Gastrointestinal: Negative.  Negative for abdominal pain, blood in stool, diarrhea, nausea and vomiting.  Genitourinary: Negative.  Negative for dysuria.  Musculoskeletal: Negative.  Negative for back pain.  Skin: Negative.   Neurological: Negative for dizziness, tremors, speech change, focal weakness, seizures and headaches.  Endo/Heme/Allergies: Negative.  Does not bruise/bleed easily.  Psychiatric/Behavioral: Negative.  Negative for depression, hallucinations and suicidal ideas.    MEDICATIONS AT HOME:   Prior to Admission medications   Medication Sig Start Date End Date Taking? Authorizing Provider  albuterol (PROVENTIL) (2.5 MG/3ML) 0.083% nebulizer solution Take 3 mLs (2.5 mg total) by nebulization every 6 (six) hours as needed for wheezing or shortness of breath. 06/06/15   Mar Daring, PA-C  aspirin EC 81 MG tablet Take 81 mg by mouth daily.    [provider]  diazepam (VALIUM) 5 MG tablet Take by mouth.  11/08/17   [provider]  fluticasone  (FLONASE) 50 MCG/ACT nasal spray Place 2 sprays into the nose daily as needed for allergies or rhinitis.     [provider]  furosemide (LASIX) 20 MG tablet Take 20 mg by mouth daily.  11/08/17   [provider]  hydrALAZINE (APRESOLINE) 25 MG tablet Take 1 tablet (25 mg total) by mouth 2 (two) times daily. 04/29/17   Jerrol Banana., MD  lisinopril (PRINIVIL,ZESTRIL) 20 MG tablet Take 1 tablet (20 mg total) by mouth daily. 06/26/17   Gladstone Lighter, MD  metoprolol succinate (TOPROL-XL) 25 MG 24 hr tablet Take 1 tablet (25 mg total) by mouth daily. 05/24/17   Jerrol Banana., MD  nitroGLYCERIN (NITROSTAT) 0.4 MG SL tablet Place 1 tablet (0.4 mg total) under the tongue every 5 (five) minutes as needed for chest pain. 07/17/16   Bettey Costa, MD  ondansetron (ZOFRAN) 4 MG tablet Take 1  tablet (4 mg total) by mouth every 8 (eight) hours as needed for nausea or vomiting. 12/14/17   Lequita Asal, MD  pantoprazole (PROTONIX) 40 MG tablet Take 1 tablet (40 mg total) by mouth 2 (two) times daily before a meal. Patient not taking: Reported on 02/15/2018 01/19/18   Virgel Manifold, MD  potassium chloride SA (K-DUR,KLOR-CON) 20 MEQ tablet Take 1 tablet (20 mEq total) by mouth daily. 06/12/17   Vaughan Basta, MD  simvastatin (ZOCOR) 40 MG tablet Take 1 tablet (40 mg total) by mouth daily. Patient not taking: Reported on 02/15/2018 11/02/17   Minna Merritts, MD  sodium chloride 1 g tablet Take 1 g by mouth 2 (two) times daily with a meal.     [provider]  tamoxifen (NOLVADEX) 20 MG tablet Take 1 tablet (20 mg total) by mouth daily. 08/02/17   Lequita Asal, MD      VITAL SIGNS:  Blood pressure (!) 159/58, pulse 64, temperature 97.7 F (36.5 C), temperature source Oral, resp. rate 17, height 5\' 1"  (1.549 m), weight 53 kg, SpO2 100 %.  PHYSICAL EXAMINATION:   Physical Exam  Constitutional: She is oriented to person, place, and time. No  distress.  HENT:  Head: Normocephalic.  Eyes: No scleral icterus.  Neck: Normal range of motion. Neck supple. No JVD present. No tracheal deviation present.  Cardiovascular: Normal rate, regular rhythm and normal heart sounds. Exam reveals no gallop and no friction rub.  No murmur heard. Pulmonary/Chest: Effort normal and breath sounds normal. No respiratory distress. She has no wheezes. She has no rales. She exhibits no tenderness.  Abdominal: Soft. Bowel sounds are normal. She exhibits no distension and no mass. There is no tenderness. There is no rebound and no guarding.  Musculoskeletal: Normal range of motion. She exhibits no edema.  Neurological: She is alert and oriented to person, place, and time.  Skin: Skin is warm. No rash noted. No erythema.  Psychiatric: Judgment normal.      LABORATORY PANEL:   CBC Recent Labs  Lab 02/22/18 1740  WBC 8.5  HGB 10.4*  HCT 31.7*  PLT 333   ------------------------------------------------------------------------------------------------------------------  Chemistries  Recent Labs  Lab 02/22/18 1740  NA 131*  K 4.2  CL 96*  CO2 28  GLUCOSE 108*  BUN 34*  CREATININE 2.30*  CALCIUM 13.3*  AST 21  ALT 14  ALKPHOS 48  BILITOT 0.3   ------------------------------------------------------------------------------------------------------------------  Cardiac Enzymes No results for input(s): TROPONINI in the last 168 hours. ------------------------------------------------------------------------------------------------------------------  RADIOLOGY:  No results found.  EKG:  Sinus rhythm no ST elevation or depression There is some artifact  IMPRESSION AND PLAN:   72 year old female with history of essential hypertension, breast cancer, anemia of chronic disease and duodenal ulcer who presents with abnormal labs to the emergency room.  1.  Hypercalcemia: This may be due to acute kidney injury/dehydration ER physician spoke  with nephrologist who is recommending IV fluids overnight Repeat calcium level in a.m. I will order TSH and PTH intact   2.  Hyponatremia likely due to component of dehydration/medication induced Start IV fluids and recheck in a.m.  3.  Acute on chronic kidney disease stage III: This may be due to dehydration Hold Lasix Hold any nephrotoxic medications Start IV fluids and repeat BMP in a.m.  4.  Essential hypertension: Continue metoprolol and hydralazine Hold Lasix and lisinopril due to acute kidney injury  5. HX of breast cancer     All the  records are reviewed and case discussed with ED provider. Management plans discussed with the patient and she is in agreement  CODE STATUS: FULL  TOTAL TIME TAKING CARE OF THIS PATIENT: 47 minutes.    Nieko Clarin M.D on 02/22/2018 at 7:41 PM  Between 7am to 6pm - Pager - [phone]  After 6pm go to [email or web address] - password [email or web address] [facility]  Office  [phone]  CC: Primary care physician; Sofie Hartigan, MD

## 2018-02-22 NOTE — ED Triage Notes (Signed)
Patient reports that she was sent by Dr. Eduard Clos for increase in calcium as well as increased kidney function. Patient reports that she is feeling more lightheaded than normal but denies any pain.

## 2018-02-23 LAB — CBC
HEMATOCRIT: 31.1 % — AB (ref 36.0–46.0)
Hemoglobin: 10 g/dL — ABNORMAL LOW (ref 12.0–15.0)
MCH: 29.5 pg (ref 26.0–34.0)
MCHC: 32.2 g/dL (ref 30.0–36.0)
MCV: 91.7 fL (ref 80.0–100.0)
Platelets: 307 10*3/uL (ref 150–400)
RBC: 3.39 MIL/uL — AB (ref 3.87–5.11)
RDW: 13.7 % (ref 11.5–15.5)
WBC: 5.2 10*3/uL (ref 4.0–10.5)
nRBC: 0 % (ref 0.0–0.2)

## 2018-02-23 LAB — BASIC METABOLIC PANEL
Anion gap: 7 (ref 5–15)
BUN: 30 mg/dL — ABNORMAL HIGH (ref 8–23)
CHLORIDE: 102 mmol/L (ref 98–111)
CO2: 24 mmol/L (ref 22–32)
Calcium: 11.5 mg/dL — ABNORMAL HIGH (ref 8.9–10.3)
Creatinine, Ser: 1.94 mg/dL — ABNORMAL HIGH (ref 0.44–1.00)
GFR, EST AFRICAN AMERICAN: 28 mL/min — AB (ref >60)
GFR, EST NON AFRICAN AMERICAN: 24 mL/min — AB (ref >60)
GLUCOSE: 132 mg/dL — AB (ref 70–99)
Potassium: 3.8 mmol/L (ref 3.5–5.1)
Sodium: 133 mmol/L — ABNORMAL LOW (ref 135–145)

## 2018-02-23 LAB — TSH: TSH: 0.614 u[IU]/mL (ref 0.350–4.500)

## 2018-02-23 MED ORDER — FLUTICASONE PROPIONATE 50 MCG/ACT NA SUSP: 2.0000 | Freq: Every day | NASAL | 0 refills | Status: AC

## 2018-02-23 MED ORDER — AMLODIPINE BESYLATE 2.5 MG PO TABS: 2.5000 mg | ORAL_TABLET | Freq: Every day | ORAL | 0 refills | Status: DC

## 2018-02-23 NOTE — Progress Notes (Signed)
[facility]  ROUNDING NOTE   Subjective:   [name] admitted to [facility] on 02/22/2018 for Hypercalcemia [E83.52] AKI (acute kidney injury) [facility]) [N17.9]  Patient was seen by my partner on 10/28 as an outpatient where she was found to have acute renal failure on chronic kidney disease stage IV with creatinine of 2.4 from baseline of 1.6 and with hypercalcemia.  Patient treated with NS at 191mL overnight  Objective:  Vital signs in last 24 hours:  Temp:  [97.7 F (36.5 C)-98.1 F (36.7 C)] 98.1 F (36.7 C) (10/30 0753) Pulse Rate:  [54-64] 56 (10/30 0753) Resp:  [16-20] 20 (10/30 0753) BP: (159-188)/(55-76) 186/75 (10/30 0753) SpO2:  [97 %-100 %] 98 % (10/30 0753) Weight:  [53 kg] 53 kg (10/29 1736)  Weight change:  Filed Weights   02/22/18 1736  Weight: 53 kg    Intake/Output: I/O last 3 completed shifts: In: 1207.9 [I.V.:1207.9] Out: -    Intake/Output this shift:  No intake/output data recorded.  Physical Exam: General: NAD,   Head: Normocephalic, atraumatic. Moist oral mucosal membranes  Eyes: Anicteric, PERRL  Neck: Supple, trachea midline  Lungs:  Clear to auscultation  Heart: Regular rate and rhythm  Abdomen:  Soft, nontender,   Extremities:  no peripheral edema.  Neurologic: Nonfocal, moving all four extremities  Skin: No lesions        Basic Metabolic Panel: Recent Labs  Lab 02/22/18 1740 02/23/18 0025  NA 131* 133*  K 4.2 3.8  CL 96* 102  CO2 28 24  GLUCOSE 108* 132*  BUN 34* 30*  CREATININE 2.30* 1.94*  CALCIUM 13.3* 11.5*    Liver Function Tests: Recent Labs  Lab 02/22/18 1740  AST 21  ALT 14  ALKPHOS 48  BILITOT 0.3  PROT 8.3*  ALBUMIN 3.5   No results for input(s): LIPASE, AMYLASE in the last 168 hours. No results for input(s): AMMONIA in the last 168 hours.  CBC: Recent Labs  Lab 02/22/18 1040 02/22/18 1740 02/23/18 0025  WBC 6.1 8.5 5.2  NEUTROABS 4.3  --   --   HGB 10.2* 10.4* 10.0*  HCT  31.3* 31.7* 31.1*  MCV 91.0 91.4 91.7  PLT 278 333 307    Cardiac Enzymes: No results for input(s): CKTOTAL, CKMB, CKMBINDEX, TROPONINI in the last 168 hours.  BNP: Invalid input(s): POCBNP  CBG: No results for input(s): GLUCAP in the last 168 hours.  Microbiology: Results for orders placed or performed during the hospital encounter of 06/22/17  CULTURE, BLOOD (ROUTINE X 2) w Reflex to ID Panel     Status: None   Collection Time: 06/25/17 12:29 PM  Result Value Ref Range Status   Specimen Description BLOOD BLOOD RIGHT HAND  Final   Special Requests   Final    BOTTLES DRAWN AEROBIC AND ANAEROBIC Blood Culture adequate volume   Culture   Final    NO GROWTH 5 DAYS Performed at [facility], [address] [place], [place] [address]    Report Status 06/30/2017 FINAL  Final  CULTURE, BLOOD (ROUTINE X 2) w Reflex to ID Panel     Status: None   Collection Time: 06/25/17  1:39 PM  Result Value Ref Range Status   Specimen Description BLOOD BLOOD RIGHT HAND  Final   Special Requests   Final    BOTTLES DRAWN AEROBIC AND ANAEROBIC Blood Culture adequate volume   Culture   Final    NO GROWTH 5 DAYS Performed at [facility]  [facility], [address] [place], [place] [address]    Report Status 06/30/2017 FINAL  Final    Coagulation Studies: No results for input(s): LABPROT, INR in the last 72 hours.  Urinalysis: Recent Labs    02/22/18 2047  COLORURINE COLORLESS*  LABSPEC 1.003*  PHURINE 8.0  GLUCOSEU 50*  HGBUR SMALL*  BILIRUBINUR NEGATIVE  KETONESUR NEGATIVE  PROTEINUR NEGATIVE  NITRITE NEGATIVE  LEUKOCYTESUR NEGATIVE      Imaging: No results found.   Medications:   . sodium chloride 125 mL/hr at 02/23/18 0444   . aspirin EC  81 mg Oral Daily  . fluticasone  2 spray Each Nare Daily  . heparin  5,000 Units Subcutaneous Q8H  . hydrALAZINE  25 mg Oral BID  . metoprolol succinate  25 mg Oral Daily  . pantoprazole  40 mg Oral Daily  . sodium  chloride  1 g Oral BID WC  . tamoxifen  20 mg Oral Daily   acetaminophen **OR** acetaminophen, albuterol, hydrALAZINE, HYDROcodone-acetaminophen, nitroGLYCERIN, ondansetron **OR** ondansetron (ZOFRAN) IV, polyethylene glycol  Assessment/ Plan:  [name] is a 73 y.o. white female with history of left breast cancer status post left mastectomy 12/26/15, anxiety, hypertension, GERD, hyperlipidemia, allergic rhinitis, history of acute idiopathic pericarditis  1. Acute renal failure on chronic kidney disease stage III: baseline creatinine of 1.69, GFR of 30 on 02/06/18 2. Proteinuria 3. Hypercalcemia 4. Hyponatremia 5. Hypertension 6. Hypokalemia 7. Anemia of chronic kidney disease: followed by [facility], Dr. Mike Gip. Received EPO.   Plan Improved with IV fluids - Increase fluid restriction to 40-44fl oz per day - add amlodipine 2.5mg  po for blood pressure control - hold dairy products due to concerns for milk-alkali syndrome.  - Continue steroids - Continue potassium chloride - Continue sodium chloride.   Follow up with Nephrology, Dr. Holley Raring in 1-2 weeks. Currently appointment scheduled for 12/12 at 11:20   LOS: 1 Bianca Shaw 10/30/20198:59 AM

## 2018-02-23 NOTE — Discharge Summary (Signed)
[facility] NAME: Bianca Shaw    MR#:  [id number]  DATE OF BIRTH:  04-04-1946  DATE OF ADMISSION:  02/22/2018   ADMITTING PHYSICIAN: Bettey Costa, MD  DATE OF DISCHARGE: 02/23/18  PRIMARY CARE PHYSICIAN: Sofie Hartigan, MD   ADMISSION DIAGNOSIS:  Hypercalcemia [E83.52] AKI (acute kidney injury) ([place]) [N17.9] DISCHARGE DIAGNOSIS:  Active Problems:   Hypercalcemia  SECONDARY DIAGNOSIS:   Past Medical History:  Diagnosis Date  . Anemia   . Anxiety disorder   . Breast cancer of upper-outer quadrant of left female breast ([place]) 11/2015   pT2 pN0(i+).;ER+; PR +, her 2 neu not overexpressed.  Mastectomy, SLN, Mammoprint: Low risk.   . Cancer ([place]) 12/03/2015   left breast/ INVASIVE LOBULAR CARCINOMA.   . Cough    lingering, mild, finished Prednisone and anitbiotic 11/03/15  . Family history of adverse reaction to anesthesia    sister - PONV  . GERD (gastroesophageal reflux disease)   . Hypertension   . Osteopenia   . Osteoporosis   . Pericarditis    diagnonsed June, 2010, unclear etiology as of yer  . Personal history of tobacco use, presenting hazards to health 10/31/2015  . Scleroderma ([facility])    ONLY ON SKIN-MILD  . UTI (lower urinary tract infection)   . Wears dentures    full upper   HOSPITAL COURSE:   Bianca Shaw is a 72 year old female with a past medical history of breast cancer, anemia of chronic disease, and hypertension who presented to the ED at the request of her nephrologist due to hypercalcemia and acute renal failure.  She was placed on IV fluids overnight.  Her calcium and kidney function both improved.  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  Her acute renal failure was felt to be secondary to dehydration and Lasix. She was started on Norvasc 2.5 mg daily for blood  pressure control.  She was discharged with close follow-up with her nephrologist.  DISCHARGE CONDITIONS:  Hypercalcemia-improved Acute renal failure on CKD 3-improved Hypertension Breast cancer Anemia of chronic disease Anxiety GERD Osteoporosis CONSULTS OBTAINED:  Treatment Team:  Lavonia Dana, MD DRUG ALLERGIES:   Allergies  Allergen Reactions  . Pimenta Nausea And Vomiting  . Tomato    DISCHARGE MEDICATIONS:   Allergies as of 02/23/2018      Reactions   Pimenta Nausea And Vomiting   Tomato       Medication List    STOP taking these medications   ondansetron 4 MG tablet Commonly known as:  ZOFRAN   pantoprazole 40 MG tablet Commonly known as:  PROTONIX   simvastatin 40 MG tablet Commonly known as:  ZOCOR     TAKE these medications   albuterol (2.5 MG/3ML) 0.083% nebulizer solution Commonly known as:  PROVENTIL Take 3 mLs (2.5 mg total) by nebulization every 6 (six) hours as needed for wheezing or shortness of breath.   amLODipine 2.5 MG tablet Commonly known as:  NORVASC Take 1 tablet (2.5 mg total) by mouth daily.   fluticasone 50 MCG/ACT nasal spray Commonly known as:  FLONASE Place 2 sprays into both nostrils daily. What changed:    how to take this  when to take this  reasons to take this   furosemide 20 MG tablet Commonly known as:  LASIX Take 20 mg by mouth daily.  hydrALAZINE 25 MG tablet Commonly known as:  APRESOLINE Take 1 tablet (25 mg total) by mouth 2 (two) times daily.   lisinopril 20 MG tablet Commonly known as:  PRINIVIL,ZESTRIL Take 1 tablet (20 mg total) by mouth daily.   metoprolol succinate 25 MG 24 hr tablet Commonly known as:  TOPROL-XL Take 1 tablet (25 mg total) by mouth daily.   nitroGLYCERIN 0.4 MG SL tablet Commonly known as:  NITROSTAT Place 1 tablet (0.4 mg total) under the tongue every 5 (five) minutes as needed for chest pain.   potassium chloride SA 20 MEQ tablet Commonly known as:   K-DUR,KLOR-CON Take 1 tablet (20 mEq total) by mouth daily.   predniSONE 20 MG tablet Commonly known as:  DELTASONE Take 20 mg by mouth daily.   sodium chloride 1 g tablet Take 1 g by mouth 2 (two) times daily with a meal.   tamoxifen 20 MG tablet Commonly known as:  NOLVADEX Take 1 tablet (20 mg total) by mouth daily.        DISCHARGE INSTRUCTIONS:  1.  Follow-up with nephrologist in 1 to 2 weeks 2.  Increase fluid restriction to 40-45 floz per nephro recommendations 3.  Added Norvasc for blood pressure control 4.  Advised patient to decrease her calcium intake DIET:  Low calcium, fluid restriction of 40-45 fl oz DISCHARGE CONDITION:  Stable ACTIVITY:  Activity as tolerated OXYGEN:  Home Oxygen: No.  Oxygen Delivery: room air DISCHARGE LOCATION:  home   If you experience worsening of your admission symptoms, develop shortness of breath, life threatening emergency, suicidal or homicidal thoughts you must seek medical attention immediately by calling 911 or calling your MD immediately  if symptoms less severe.  You Must read complete instructions/literature along with all the possible adverse reactions/side effects for all the Medicines you take and that have been prescribed to you. Take any new Medicines after you have completely understood and accpet all the possible adverse reactions/side effects.   Please note  You were cared for by a hospitalist during your hospital stay. If you have any questions about your discharge medications or the care you received while you were in the hospital after you are discharged, you can call the unit and asked to speak with the hospitalist on call if the hospitalist that took care of you is not available. Once you are discharged, your primary care physician will handle any further medical issues. Please note that NO REFILLS for any discharge medications will be authorized once you are discharged, as it is imperative that you return to your  primary care physician (or establish a relationship with a primary care physician if you do not have one) for your aftercare needs so that they can reassess your need for medications and monitor your lab values.    On the day of Discharge:  VITAL SIGNS:  Blood pressure (!) 186/75, pulse (!) 56, temperature 98.1 F (36.7 C), temperature source Oral, resp. rate 20, height 5\' 1"  (1.549 m), weight 53 kg, SpO2 98 %. PHYSICAL EXAMINATION:  GENERAL:  72 y.o.-year-old patient lying in the bed with no acute distress.  EYES: Pupils equal, round, reactive to light and accommodation. No scleral icterus. Extraocular muscles intact.  HEENT: Head atraumatic, normocephalic. Oropharynx and nasopharynx clear.  NECK:  Supple, no jugular venous distention. No thyroid enlargement, no tenderness.  LUNGS: Normal breath sounds bilaterally, no wheezing, rales,rhonchi or crepitation. No use of accessory muscles of respiration.  CARDIOVASCULAR: S1, S2 normal. No murmurs, rubs,  or gallops.  ABDOMEN: Soft, non-tender, non-distended. Bowel sounds present. No organomegaly or mass.  EXTREMITIES: No pedal edema, cyanosis, or clubbing.  NEUROLOGIC: Cranial nerves II through XII are intact. Muscle strength 5/5 in all extremities. Sensation intact. Gait not checked.  PSYCHIATRIC: The patient is alert and oriented x 3.  SKIN: No obvious rash, lesion, or ulcer.  DATA REVIEW:   CBC Recent Labs  Lab 02/23/18 0025  WBC 5.2  HGB 10.0*  HCT 31.1*  PLT 307    Chemistries  Recent Labs  Lab 02/22/18 1740 02/23/18 0025  NA 131* 133*  K 4.2 3.8  CL 96* 102  CO2 28 24  GLUCOSE 108* 132*  BUN 34* 30*  CREATININE 2.30* 1.94*  CALCIUM 13.3* 11.5*  AST 21  --   ALT 14  --   ALKPHOS 48  --   BILITOT 0.3  --      Microbiology Results  Results for orders placed or performed during the hospital encounter of 06/22/17  CULTURE, BLOOD (ROUTINE X 2) w Reflex to ID Panel     Status: None   Collection Time: 06/25/17 12:29  PM  Result Value Ref Range Status   Specimen Description BLOOD BLOOD RIGHT HAND  Final   Special Requests   Final    BOTTLES DRAWN AEROBIC AND ANAEROBIC Blood Culture adequate volume   Culture   Final    NO GROWTH 5 DAYS Performed at [facility], [address] [place], [address] [address]    Report Status 06/30/2017 FINAL  Final  CULTURE, BLOOD (ROUTINE X 2) w Reflex to ID Panel     Status: None   Collection Time: 06/25/17  1:39 PM  Result Value Ref Range Status   Specimen Description BLOOD BLOOD RIGHT HAND  Final   Special Requests   Final    BOTTLES DRAWN AEROBIC AND ANAEROBIC Blood Culture adequate volume   Culture   Final    NO GROWTH 5 DAYS Performed at [facility], [address] [place], [place] [address]    Report Status 06/30/2017 FINAL  Final    RADIOLOGY:  No results found.   Management plans discussed with the patient, family and they are in agreement.  CODE STATUS: Full Code   TOTAL TIME TAKING CARE OF THIS PATIENT: 33 minutes.    Berna Spare Skyra Crichlow M.D on 02/23/2018 at 1:14 PM  Between 7am to 6pm - Pager - [phone]  After 6pm go to [email or web address] - Proofreader  [facility]  Office  [phone]  CC: Primary care physician; Sofie Hartigan, MD   Note: This dictation was prepared with Dragon dictation along with smaller phrase technology. Any transcriptional errors that result from this process are unintentional.

## 2018-02-23 NOTE — Discharge Instructions (Signed)
It was so nice to meet you during this hospitalization!  You came into the hospital because your calcium was too high. Your calcium came down with IV fluids.  The kidney doctor started a medication called amlodipine for your blood pressure. Please take 1 tablet daily.  He also recommended a fluid restriction of 40-45 fl oz per day and holding dairy products, which likely contributed to your high calcium level.  Please follow-up with Dr. Holley Raring in 1-2 weeks.  -Dr. Brett Albino

## 2018-02-23 NOTE — Progress Notes (Signed)
Discharge summary reviewed with verbal understanding. New medication reviewed. Escorted to personal vehicle via wc.

## 2018-02-24 LAB — PARATHYROID HORMONE, INTACT (NO CA): PTH: 11 pg/mL — ABNORMAL LOW (ref 15–65)

## 2018-02-28 DIAGNOSIS — N179 Acute kidney failure, unspecified: Secondary | ICD-10-CM | POA: Diagnosis not present

## 2018-02-28 DIAGNOSIS — I10 Essential (primary) hypertension: Secondary | ICD-10-CM | POA: Diagnosis not present

## 2018-02-28 DIAGNOSIS — N183 Chronic kidney disease, stage 3 (moderate): Secondary | ICD-10-CM | POA: Diagnosis not present

## 2018-03-07 DIAGNOSIS — E871 Hypo-osmolality and hyponatremia: Secondary | ICD-10-CM | POA: Diagnosis not present

## 2018-03-07 DIAGNOSIS — I10 Essential (primary) hypertension: Secondary | ICD-10-CM | POA: Diagnosis not present

## 2018-03-07 DIAGNOSIS — R809 Proteinuria, unspecified: Secondary | ICD-10-CM | POA: Diagnosis not present

## 2018-03-08 ENCOUNTER — Encounter: Payer: Self-pay | Admitting: Gastroenterology

## 2018-03-08 ENCOUNTER — Encounter: Payer: Self-pay | Admitting: Hematology and Oncology

## 2018-03-08 ENCOUNTER — Inpatient Hospital Stay: Payer: PPO | Attending: Hematology and Oncology

## 2018-03-08 ENCOUNTER — Inpatient Hospital Stay: Payer: PPO

## 2018-03-08 ENCOUNTER — Ambulatory Visit: Payer: PPO | Admitting: Gastroenterology

## 2018-03-08 ENCOUNTER — Ambulatory Visit: Admit: 2018-03-08 | Payer: PPO | Admitting: Gastroenterology

## 2018-03-08 ENCOUNTER — Other Ambulatory Visit: Payer: Self-pay | Admitting: Hematology and Oncology

## 2018-03-08 ENCOUNTER — Inpatient Hospital Stay: Payer: PPO | Admitting: Hematology and Oncology

## 2018-03-08 VITALS — BP 159/81 | HR 56 | Temp 97.7°F | Resp 18 | Wt 123.4 lb

## 2018-03-08 VITALS — BP 160/64 | HR 66 | Ht 65.0 in | Wt 125.4 lb

## 2018-03-08 DIAGNOSIS — K2950 Unspecified chronic gastritis without bleeding: Secondary | ICD-10-CM | POA: Insufficient documentation

## 2018-03-08 DIAGNOSIS — Z87891 Personal history of nicotine dependence: Secondary | ICD-10-CM | POA: Insufficient documentation

## 2018-03-08 DIAGNOSIS — Z79899 Other long term (current) drug therapy: Secondary | ICD-10-CM | POA: Diagnosis not present

## 2018-03-08 DIAGNOSIS — E538 Deficiency of other specified B group vitamins: Secondary | ICD-10-CM

## 2018-03-08 DIAGNOSIS — C50912 Malignant neoplasm of unspecified site of left female breast: Secondary | ICD-10-CM

## 2018-03-08 DIAGNOSIS — J439 Emphysema, unspecified: Secondary | ICD-10-CM

## 2018-03-08 DIAGNOSIS — M810 Age-related osteoporosis without current pathological fracture: Secondary | ICD-10-CM

## 2018-03-08 DIAGNOSIS — E222 Syndrome of inappropriate secretion of antidiuretic hormone: Secondary | ICD-10-CM | POA: Insufficient documentation

## 2018-03-08 DIAGNOSIS — C50412 Malignant neoplasm of upper-outer quadrant of left female breast: Secondary | ICD-10-CM

## 2018-03-08 DIAGNOSIS — D631 Anemia in chronic kidney disease: Principal | ICD-10-CM

## 2018-03-08 DIAGNOSIS — Z170 Estrogen receptor positive status [ER+]: Secondary | ICD-10-CM

## 2018-03-08 DIAGNOSIS — N189 Chronic kidney disease, unspecified: Secondary | ICD-10-CM

## 2018-03-08 DIAGNOSIS — R857 Abnormal histological findings in specimens from digestive organs and abdominal cavity: Secondary | ICD-10-CM

## 2018-03-08 DIAGNOSIS — N184 Chronic kidney disease, stage 4 (severe): Secondary | ICD-10-CM

## 2018-03-08 DIAGNOSIS — Z79810 Long term (current) use of selective estrogen receptor modulators (SERMs): Secondary | ICD-10-CM | POA: Diagnosis not present

## 2018-03-08 DIAGNOSIS — E871 Hypo-osmolality and hyponatremia: Secondary | ICD-10-CM

## 2018-03-08 DIAGNOSIS — R7989 Other specified abnormal findings of blood chemistry: Secondary | ICD-10-CM

## 2018-03-08 DIAGNOSIS — Z8619 Personal history of other infectious and parasitic diseases: Secondary | ICD-10-CM

## 2018-03-08 DIAGNOSIS — M349 Systemic sclerosis, unspecified: Secondary | ICD-10-CM | POA: Insufficient documentation

## 2018-03-08 DIAGNOSIS — E876 Hypokalemia: Secondary | ICD-10-CM | POA: Insufficient documentation

## 2018-03-08 DIAGNOSIS — Z9012 Acquired absence of left breast and nipple: Secondary | ICD-10-CM | POA: Insufficient documentation

## 2018-03-08 LAB — COMPREHENSIVE METABOLIC PANEL
ALT: 19 U/L (ref 0–44)
AST: 19 U/L (ref 15–41)
Albumin: 3.6 g/dL (ref 3.5–5.0)
Alkaline Phosphatase: 38 U/L (ref 38–126)
Anion gap: 6 (ref 5–15)
BUN: 26 mg/dL — ABNORMAL HIGH (ref 8–23)
CO2: 23 mmol/L (ref 22–32)
Calcium: 9.4 mg/dL (ref 8.9–10.3)
Chloride: 102 mmol/L (ref 98–111)
Creatinine, Ser: 1.19 mg/dL — ABNORMAL HIGH (ref 0.44–1.00)
GFR calc Af Amer: 51 mL/min — ABNORMAL LOW (ref >60)
GFR calc non Af Amer: 44 mL/min — ABNORMAL LOW (ref >60)
Glucose, Bld: 92 mg/dL (ref 70–99)
Potassium: 3.3 mmol/L — ABNORMAL LOW (ref 3.5–5.1)
Sodium: 131 mmol/L — ABNORMAL LOW (ref 135–145)
Total Bilirubin: 0.3 mg/dL (ref 0.3–1.2)
Total Protein: 7.6 g/dL (ref 6.5–8.1)

## 2018-03-08 LAB — CBC WITH DIFFERENTIAL/PLATELET
Abs Immature Granulocytes: 0.09 10*3/uL — ABNORMAL HIGH (ref 0.00–0.07)
Basophils Absolute: 0 10*3/uL (ref 0.0–0.1)
Basophils Relative: 0 %
Eosinophils Absolute: 0.1 10*3/uL (ref 0.0–0.5)
Eosinophils Relative: 1 %
HCT: 33.5 % — ABNORMAL LOW (ref 36.0–46.0)
Hemoglobin: 10.9 g/dL — ABNORMAL LOW (ref 12.0–15.0)
Immature Granulocytes: 1 %
Lymphocytes Relative: 7 %
Lymphs Abs: 0.8 10*3/uL (ref 0.7–4.0)
MCH: 29.9 pg (ref 26.0–34.0)
MCHC: 32.5 g/dL (ref 30.0–36.0)
MCV: 91.8 fL (ref 80.0–100.0)
Monocytes Absolute: 1 10*3/uL (ref 0.1–1.0)
Monocytes Relative: 9 %
Neutro Abs: 9.5 10*3/uL — ABNORMAL HIGH (ref 1.7–7.7)
Neutrophils Relative %: 82 %
Platelets: 268 10*3/uL (ref 150–400)
RBC: 3.65 MIL/uL — ABNORMAL LOW (ref 3.87–5.11)
RDW: 14.8 % (ref 11.5–15.5)
WBC: 11.6 10*3/uL — ABNORMAL HIGH (ref 4.0–10.5)
nRBC: 0 % (ref 0.0–0.2)

## 2018-03-08 LAB — FOLATE: Folate: 9 ng/mL (ref >5.9)

## 2018-03-08 LAB — FERRITIN: Ferritin: 47 ng/mL (ref 11–307)

## 2018-03-08 LAB — VITAMIN B12: Vitamin B-12: 212 pg/mL (ref 180–914)

## 2018-03-08 SURGERY — ESOPHAGOGASTRODUODENOSCOPY (EGD) WITH PROPOFOL
Anesthesia: General

## 2018-03-08 NOTE — Progress Notes (Signed)
Patient spent one night in the hospital week before last for elevated calcium and decreased sodium.  Offers no complaints today.

## 2018-03-08 NOTE — Progress Notes (Signed)
Bianca Shaw day:  03/08/2018    Chief Complaint: Bianca Shaw is a 72 y.o. female with stage IIA left breast cancer, anemia of chronic renal disease, and electrolytes abnormalities (hyponatremia and hypercalcemia) who is seen for 3 month assessment on tamoxifen.   HPI:  The patient was last seen in the medical oncology [facility] on 12/14/2017.  At that time, she was doing well.  She denied any breast concerns.  Exam was stable.  Hemoglobin was 9.6.  Sodium was 131.  Calcium was 9.6.  CA27.29 was normal.  She continued tamoxifen.  She received Procrit.  She has received Procrit every 2 weeks (last 02/08/2018).  Hemoglobin was 10.0 on 02/23/2018.  Patient underwent EGD on 02/15/2018 that demonstrated a single non-bleeding duodenal ulcer measuring 6 mm in largest dimension.  There were mucosal changes noted in the gastric antrum.  Pathology revealed chronic active gastritis.  There was villous atrophy with marked active inflammation noted in the specimen taken from the duodenal bulb.  Nodular mucosa of the gastric body revealed one tiny fragment with intestinal metaplasia.  Patient was seen in the ED on 02/22/2018 by Dr. Delman Kitten for evaluation of hypercalcemia.  Notes reviewed.  Labs obtained in the ED revealed an elevated calcium level of 13.3 mg/dL.  Sodium was chronically low at 133 mmol/L.  BUN 34 and creatinine 2.30 mg/dL.  Decision was made to admit patient for further evaluation and treatment.  Patient was admitted to [facility] from 02/22/2018-02/23/2018. Notes from hospital course reviewed.  Patient received gentle IV hydration.  Restriction was increased to 40-45 fluid ounces daily.  She was started on amlodipine 2.5 mg daily.  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  Patient advised to avoid all dairy products until further advised.  She was started on steroids for possible sarcoidosis vs  granulomatous disease.  Patient discharged home to follow-up with nephrology as an outpatient.  Calcium at the time of discharge had decreased to 11.5 mg/dL.  She is scheduled to see endocrinology on 03/09/2018 for further evaluation of her HYPERcalcemia.   During the interim, patient is doing well. She denies any acute physical concerns today. Patient continues on prednisone 20 mg daily as prescribed by nephrology Holley Raring, MD).    Past Medical History:  Diagnosis Date  . Anemia   . Anxiety disorder   . Breast cancer of upper-outer quadrant of left female breast ([place]) 11/2015   pT2 pN0(i+).;ER+; PR +, her 2 neu not overexpressed.  Mastectomy, SLN, Mammoprint: Low risk.   . Cancer ([place]) 12/03/2015   left breast/ INVASIVE LOBULAR CARCINOMA.   . Cough    lingering, mild, finished Prednisone and anitbiotic 11/03/15  . Family history of adverse reaction to anesthesia    sister - PONV  . GERD (gastroesophageal reflux disease)   . Hypertension   . Osteopenia   . Osteoporosis   . Pericarditis    diagnonsed June, 2010, unclear etiology as of yer  . Personal history of tobacco use, presenting hazards to health 10/31/2015  . Scleroderma ([facility])    ONLY ON SKIN-MILD  . UTI (lower urinary tract infection)   . Wears dentures    full upper    Past Surgical History:  Procedure Laterality Date  . BREAST BIOPSY Right 2012   core - neg  . BREAST BIOPSY Left 12/03/2015   INVASIVE LOBULAR CARCINOMA.   Marland Kitchen CARDIAC CATHETERIZATION    .  CATARACT EXTRACTION W/ INTRAOCULAR LENS IMPLANT Right   . COLONOSCOPY WITH PROPOFOL N/A 11/08/2015   Procedure: COLONOSCOPY WITH PROPOFOL;  Surgeon: Lucilla Lame, MD;  Location: [facility];  Service: Endoscopy;  Laterality: N/A;  . ESOPHAGOGASTRODUODENOSCOPY (EGD) WITH PROPOFOL N/A 11/18/2017   Procedure: ESOPHAGOGASTRODUODENOSCOPY (EGD) WITH PROPOFOL;  Surgeon: Virgel Manifold, MD;  Location: [facility] ENDOSCOPY;  Service: Endoscopy;  Laterality: N/A;  .  ESOPHAGOGASTRODUODENOSCOPY (EGD) WITH PROPOFOL N/A 02/15/2018   Procedure: ESOPHAGOGASTRODUODENOSCOPY (EGD) WITH PROPOFOL;  Surgeon: Jonathon Bellows, MD;  Location[facility] ENDOSCOPY;  Service: Gastroenterology;  Laterality: N/A;  . EVACUATION BREAST HEMATOMA Left 01/14/2016   Procedure: EVACUATION HEMATOMA BREAST;  Surgeon: Robert Bellow, MD;  Location: [facility] ORS;  Service: General;  Laterality: Left;  . EYE SURGERY    . MASTECTOMY Left 2017   complete mastectomy  . MASTECTOMY W/ SENTINEL NODE BIOPSY Left 12/26/2015   Procedure: MASTECTOMY WITH SENTINEL LYMPH NODE BIOPSY;  Surgeon: Robert Bellow, MD;  Location: [facility] ORS;  Service: General;  Laterality: Left;  . RIGHT/LEFT HEART CATH AND CORONARY ANGIOGRAPHY N/A 07/22/2016   Procedure: Right/Left Heart Cath and Coronary Angiography;  Surgeon: Minna Merritts, MD;  Location: [facility] CV LAB;  Service: Cardiovascular;  Laterality: N/A;  . TUBAL LIGATION    . VESICOVAGINAL FISTULA CLOSURE W/ TAH      Family History  Problem Relation Age of Onset  . Heart failure Mother   . Epilepsy Mother   . COPD Father   . Heart disease Father   . Anxiety disorder Sister   . Arthritis Brother   . Heart disease Brother   . Vaginal cancer Paternal Grandmother   . Heart attack Paternal Grandfather   . COPD Brother   . Kidney failure Brother   . COPD Brother   . Arthritis Sister   . Uterine cancer Unknown   . Diabetes Unknown   . Colon cancer Neg Hx   . Stomach cancer Neg Hx   . Breast cancer Neg Hx     Social History:  reports that she quit smoking about 14 years ago. Her smoking use included cigarettes. She has a 30.00 pack-year smoking history. She has never used smokeless tobacco. She reports that she drinks about 1.0 - 3.0 standard drinks of alcohol per week. She reports that she does not use drugs.  She has 5 brothers and 2 sisters.  She has 2 children who are alive and well.  She lives in [place] with her husband, Pat Patrick.  The patient is  accompanied by her husband today.  Allergies:  Allergies  Allergen Reactions  . Pimenta Nausea And Vomiting  . Tomato     Current Medications: Current Outpatient Medications  Medication Sig Dispense Refill  . amLODipine (NORVASC) 2.5 MG tablet Take 1 tablet (2.5 mg total) by mouth daily. 30 tablet 0  . fluticasone (FLONASE) 50 MCG/ACT nasal spray Place 2 sprays into both nostrils daily. 16 g 0  . furosemide (LASIX) 20 MG tablet Take 20 mg by mouth daily.     . hydrALAZINE (APRESOLINE) 25 MG tablet Take 1 tablet (25 mg total) by mouth 2 (two) times daily. 60 tablet 11  . lisinopril (PRINIVIL,ZESTRIL) 20 MG tablet Take 1 tablet (20 mg total) by mouth daily. 30 tablet 2  . metoprolol succinate (TOPROL-XL) 25 MG 24 hr tablet Take 1 tablet (25 mg total) by mouth daily. 90 tablet 3  . potassium chloride SA (K-DUR,KLOR-CON) 20 MEQ tablet Take 1 tablet (20 mEq total)  by mouth daily. 30 tablet 0  . predniSONE (DELTASONE) 20 MG tablet Take 20 mg by mouth daily.    . sodium chloride 1 g tablet Take 1 g by mouth 2 (two) times daily with a meal.     . tamoxifen (NOLVADEX) 20 MG tablet Take 1 tablet (20 mg total) by mouth daily. 90 tablet 0  . albuterol (PROVENTIL) (2.5 MG/3ML) 0.083% nebulizer solution Take 3 mLs (2.5 mg total) by nebulization every 6 (six) hours as needed for wheezing or shortness of breath. (Patient not taking: Reported on 02/22/2018) 75 mL 12  . nitroGLYCERIN (NITROSTAT) 0.4 MG SL tablet Place 1 tablet (0.4 mg total) under the tongue every 5 (five) minutes as needed for chest pain. (Patient not taking: Reported on 02/22/2018) 30 tablet 0   No current facility-administered medications for this visit.     Review of Systems  Constitutional: Positive for weight loss (12 pounds). Negative for chills, diaphoresis, fever and malaise/fatigue.       Feels "good".  HENT: Negative for congestion, ear discharge, ear pain, nosebleeds, sinus pain, sore throat and tinnitus.   Eyes: Negative.   Negative for blurred vision, photophobia, pain, discharge and redness.  Respiratory: Negative.  Negative for cough, hemoptysis, sputum production and shortness of breath.   Cardiovascular: Negative for chest pain, palpitations, orthopnea, leg swelling and PND.  Gastrointestinal: Negative for abdominal pain, blood in stool, constipation, diarrhea, melena, nausea (resolved) and vomiting.       Duodenal ulcer.  Genitourinary: Negative.  Negative for dysuria, frequency, hematuria and urgency.  Musculoskeletal: Negative.  Negative for back pain, falls, joint pain, myalgias and neck pain.       Osteoporosis.  Skin: Negative.  Negative for itching and rash.       Scleroderma.  Neurological: Negative.  Negative for dizziness, tremors, sensory change, speech change, focal weakness, weakness and headaches.  Endo/Heme/Allergies: Negative.  Does not bruise/bleed easily.  Psychiatric/Behavioral: Positive for memory loss. Negative for depression. The patient is not nervous/anxious and does not have insomnia.   All other systems reviewed and are negative.  Performance status (ECOG): 0 - Asymptomatic  Vital Signs BP (!) 159/81 (BP Location: Left Arm, Patient Position: Sitting)   Pulse (!) 56   Temp 97.7 F (36.5 C) (Tympanic)   Resp 18   Wt 123 lb 7 oz (56 kg)   BMI 23.32 kg/m   Physical Exam  Constitutional: She is well-developed, well-nourished, and in no distress. No distress.  HENT:  Head: Normocephalic.  Mouth/Throat: Oropharynx is clear and moist. No oropharyngeal exudate.  Short brown hair.  Eyes: Conjunctivae and EOM are normal. No scleral icterus.  Blue eyes.  Neck: Neck supple. No JVD present.  Cardiovascular: Normal rate, regular rhythm and normal heart sounds. Exam reveals no gallop and no friction rub.  No murmur heard. Pulmonary/Chest: Effort normal and breath sounds normal. No respiratory distress. She has no wheezes. She has no rales. Right breast exhibits skin change  (fibrocystic changes upper outer quadrant) and tenderness. Right breast exhibits no inverted nipple, no mass and no nipple discharge. Breasts are asymmetrical (LEFT mestectomy; no erythema, nodularity or skin changes).  Abdominal: Soft. Bowel sounds are normal. She exhibits no distension and no mass. There is no abdominal tenderness. There is no rebound and no guarding.  Musculoskeletal: Normal range of motion.        General: No tenderness or edema.  Lymphadenopathy:    She has no cervical adenopathy.    She has no  axillary adenopathy.       Right: No inguinal and no supraclavicular adenopathy present.       Left: No inguinal and no supraclavicular adenopathy present.  Neurological: She is alert. Gait normal.  Skin: Skin is warm. She is not diaphoretic. No erythema. No pallor.  Psychiatric: Mood, affect and judgment normal.  Nursing note and vitals reviewed.   Imaging studies: 11/21/2015:  Left mammogram and ultrasound revealed a 4.2 x 2.4 x 2.6 cm irregular hypoechoic mass in the 1-2 o'clock position in the left breast 3 cm from the nipple.  Left axillary lymph nodes appeared normal.   12/25/2015:  Breast MRI revealed a 6.2 cm area of abnormal enhancement in the upper outer quadrant of the left breast.  The right breast revealed no mass or abnormal enhancement. 12/08/2016:  Right mammogram revealed no evidence of malignancy. 06/04/2017:  Chest CT revealed no pneumonia, edema, collapse or effusion.  There was a right middle lobe calcified granuloma. There was a stable 8 mm scar posterior aspect of the right upper lobe. 06/11/2017:  Abdomen and pelvic CT revealed no acute process in the abdomen or pelvis.  There was possible bladder wall thickening (? under distention).  There was nonspecific presacral and posterior pelvic edema.  There was central uterine hypoattenuation (subtle). 06/16/2017:  Head MRI without contrast revealed no acute intracranial abnormality.  There was wide spread changes  c/w advanced small vessel disease.  06/22/2017:  Bone survey revealed no focal lytic or sclerotic lesion. 08/23/2017: RIGHT diagnostic mammogram and breast ultrasound on 08/23/2017 revealed no mammographic or sonographic evidence of malignancy in the RIGHT breast. Patient has heterogeneously dense breast tissue (ACR  breast density category C).    Appointment on 03/08/2018  Component Date Value Ref Range Status  . WBC 03/08/2018 11.6* 4.0 - 10.5 K/uL Final  . RBC 03/08/2018 3.65* 3.87 - 5.11 MIL/uL Final  . Hemoglobin 03/08/2018 10.9* 12.0 - 15.0 g/dL Final  . HCT 03/08/2018 33.5* 36.0 - 46.0 % Final  . MCV 03/08/2018 91.8  80.0 - 100.0 fL Final  . MCH 03/08/2018 29.9  26.0 - 34.0 pg Final  . MCHC 03/08/2018 32.5  30.0 - 36.0 g/dL Final  . RDW 03/08/2018 14.8  11.5 - 15.5 % Final  . Platelets 03/08/2018 268  150 - 400 K/uL Final  . nRBC 03/08/2018 0.0  0.0 - 0.2 % Final  . Neutrophils Relative % 03/08/2018 82  % Final  . Neutro Abs 03/08/2018 9.5* 1.7 - 7.7 K/uL Final  . Lymphocytes Relative 03/08/2018 7  % Final  . Lymphs Abs 03/08/2018 0.8  0.7 - 4.0 K/uL Final  . Monocytes Relative 03/08/2018 9  % Final  . Monocytes Absolute 03/08/2018 1.0  0.1 - 1.0 K/uL Final  . Eosinophils Relative 03/08/2018 1  % Final  . Eosinophils Absolute 03/08/2018 0.1  0.0 - 0.5 K/uL Final  . Basophils Relative 03/08/2018 0  % Final  . Basophils Absolute 03/08/2018 0.0  0.0 - 0.1 K/uL Final  . Immature Granulocytes 03/08/2018 1  % Final  . Abs Immature Granulocytes 03/08/2018 0.09* 0.00 - 0.07 K/uL Final   Performed at [facility], [address] [place], [place] [address]  . Folate 03/08/2018 9.0  >5.9 ng/mL Final   Performed at [facility], [address] [place], [place] [address]  . Ferritin 03/08/2018 47  11 - 307 ng/mL Final   Performed at [facility], [address] [place], [place] [address]  . Sodium  03/08/2018 131* 135 - 145 mmol/L Final  . Potassium  03/08/2018 3.3* 3.5 - 5.1 mmol/L Final  . Chloride 03/08/2018 102  98 - 111 mmol/L Final  . CO2 03/08/2018 23  22 - 32 mmol/L Final  . Glucose, Bld 03/08/2018 92  70 - 99 mg/dL Final  . BUN 03/08/2018 26* 8 - 23 mg/dL Final  . Creatinine, Ser 03/08/2018 1.19* 0.44 - 1.00 mg/dL Final  . Calcium 03/08/2018 9.4  8.9 - 10.3 mg/dL Final  . Total Protein 03/08/2018 7.6  6.5 - 8.1 g/dL Final  . Albumin 03/08/2018 3.6  3.5 - 5.0 g/dL Final  . AST 03/08/2018 19  15 - 41 U/L Final  . ALT 03/08/2018 19  0 - 44 U/L Final  . Alkaline Phosphatase 03/08/2018 38  38 - 126 U/L Final  . Total Bilirubin 03/08/2018 0.3  0.3 - 1.2 mg/dL Final  . GFR calc non Af Amer 03/08/2018 44* >60 mL/min Final  . GFR calc Af Amer 03/08/2018 51* >60 mL/min Final   Comment: (NOTE) The eGFR has been calculated using the CKD EPI equation. This calculation has not been validated in all clinical situations. eGFR's persistently <60 mL/min signify possible Chronic Kidney Disease.   Georgiann Hahn gap 03/08/2018 6  5 - 15 Final   Performed at [facility], [address] [place],  [address]    Assessment:  KATHLEENE BERGEMANN is a 72 y.o. female with stage IIA (T2N0) left breast cancer s/p mastectomy with sentinel lymph node biopsy on 12/26/2015.   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  Margins were negative.  Two sentinel lymph nodes were negative for macrometastasis, but with isolated tumor cells on IHC stains.  Three additional lymph nodes were positive for isolated tumor cells on IHC.  Tumor was ER positive (> 90%), PR positive (> 90%), and Her2/neu 2+ (eqivocal).  Her2/neu by FISH was negative.  Pathologic stage was pT2 pN0(i+).  MammaPrint testing revealed low risk luminal type A. There was a 97.8% probability of being disease free at 10 years with hormonal therapy.   Right mammogram on 12/08/2016 revealed no evidence of malignancy. RIGHT diagnostic  mammogram and breast ultrasound on 08/23/2017 revealed no mammographic or sonographic evidence of malignancy in the RIGHT breast. Patient has heterogeneously dense breast tissue (ACR  breast density category C).   Exam in 11/2016 revealed a 4 mm nodule in the left axillae above the mastectomy incision.  Excision biopsy on 12/15/2016 revealed fat necrosis and no malignancy.  Imaging studies (chest, abdomen, and pelvic CT, head MRI, and bone survey) in 05/2017 revealed no evidence of metastatic disease.  She began tamoxifen on 02/04/2016.  She is tolerating it well.  CA27.29 has been followed: 27.3 on 12/10/2015, 19.4 on 05/30/2016, 16.4 on 09/07/2016, 17.2 on 12/08/2016, 21.3 on 04/09/2017, 17.6 on 08/12/2017, and 18.8 on 12/14/2017.  Bone density on 11/05/2015 revealed osteoporosis with a T score of -2.9 in the AP spine L1-L2 and -1.9 in the left femoral neck.  Bone density on 11/08/2017 revealed osteopenia with a T-score of -2.0  in the AP spine and -1.8 in the LEFT femoral neck. She started Fosamax in 10/2015.  She is on calcium and vitamin D.  She had a colonoscopy in 09/2015.  She denies any melena, hematochezia, hematuria or vaginal bleeding.  She began oral B12 in early 03/2017.  B12 was 295 on 06/09/2016, 299 on 12/08/2016, and 1585 on 05/24/2017.  Folate was  11.5 on 04/09/2017.  EGD on 11/18/2017 revealed 3 to 4 non-bleeding ulcers, with the largest measuring 8 mm. There was no centralized bleeding, however there was mild oozing noted at the ulcer edges. Pathology revealed active gastritis with no evidence of high grade dysplasia or malignancy. IHC testing (+) for H. pylori.   EGD on 02/15/2018 that demonstrated a single non-bleeding duodenal ulcer measuring 6 mm in largest dimension.  There were mucosal changes noted in the gastric antrum.  Pathology revealed chronic active gastritis.  There was villous atrophy with marked active inflammation noted in the specimen taken from the duodenal  bulb.  Nodular mucosa of the gastric body revealed one tiny fragment with intestinal metaplasia.  She has a normocytic anemia.  She has anemia of chronic renal disease, stage IV.  Work-up on 12/08/2016 and 04/09/2017 revealed a low normal B12 with normal MMA thus r/o B12 deficiency.  SPEP revealed no monoclonal protein.  Free light chain ratio was 2.43 (0.26-1.65), unclear significance.  Ferritin, iron saturation, and folate were normal.  TSH is normal.  Retic was 1.2% (inappropriately low) on 09/07/2016 and 1.1% on 06/23/2017.  Coombs was negative on 10/19/2016 and 06/24/2017.  Creatinine was 1.41 (CrCl 36 ml/min) on 06/29/2017 and 1.94 (CrCl 19.5 ml/min) on 02/23/2018.    24 hour urine (IFE) on 12/28/2016 revealed no monoclonal protein. 24 hour urine on 06/23/2017 revealed kappa free light chains 157 (1.35-24.19), lambda free light chains 6.99 (0.24-6.66), and free light chain ratio 22.46 (2.04-10.37).  Kappa free light chains were 159.9 (ratio 2.79) on 06/23/2017.  SPEP was normal on 06/23/2017.  Bone marrow aspirate and biopsy on 07/07/2017 revealed a variably cellular bone marrow with trilineage hematopoiesis.  There was slight plasmacytosis (4%).  Plasma cells were polyclonal for kappa and lambda light chains.  There were several lymphoid aggregates.  Congo stain for amyloid was negative.  Flow cytometry revealed no monoclonal B-cell population or abnormal T-cell population.  Cytogenetics were normal (22, XX).  She has a history of chronic hyponatremia (dating back to 07/2012).  She is followed by nephrology.  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  She is on fluid restriction. Cortisol and uric acid were normal on 06/24/2017.   She has scleroderma.  She has skin thickening at sites of abrasion.  She has hypercalcemia felt related to thiazide diuretic and excess calcium intake.  She was seen by endocrinology.  PTH was 14 (low) and not c/w primary hyperparathyroidism.  TSH was normal.   PTH-rp on 06/23/2017 was < 2.0.  ACE level was 47 (normal) on 06/23/2017.  Calcitriol (vitamin D 1, 25 dihydroxy) was 75.3 (19.9-79.3).  Vitamin D, 25 hydroxy was 68.6 (30-100) on 06/22/2017.  She denies excess vitamin A and vitamin D intake.    Symptomatically, her energy level is good.  Exam is stable.  Hemoglobin is 10.9.  Sodium is 131.  Potassium is 3.3.  Calcium is 9.4.  CA27.29 is normal.  Plan: 1. Labs today:  CBC with diff, CMP, ferritin, B12, and folate. 2. Breast cancer - treatment ongoing Clinically doing well. Continue tamoxifen.  Next mammogram 08/24/2018. 3. Anemia of chronic renal disease Hemoglobin 10.9 and hematocrit 33.5 No Procrit today.  Continue routine lab monitoring and Procrit as previously prescribed.  4. Osteopenia - improved T-score of -2.0 (previously -2.9) in the AP spine and -1.8 (previously -1.9) in the LEFT femoral neck.  Ten-year fracture probability by FRAX of 7.1% or greater for hip fracture or 18.5% or greater for major osteoporotic fracture.  5. SIADH Sodium 131 off salt tablets. Follow-up with Dr. Holley Raring as scheduled. 6. HYPERcalcemia  Review interval hospitalization for hypercalcemia. Calcium is 9.4 today. Etiology unclear but possibly related to milk-alkali syndrome or granulomatous disease/sarcoid.  Patient on steroids.  Follow-up with endocrinology. 7. Hypokalemia  Patient currently taking potassium chloride 20 meq a day.  Increase potassium to BID x 2-3 days. 8. Chronic active gastritis Review interval EGD. Appointment with GI today. 9. RTC every 2 weeks for labs (CBC) +/- Procrit.  10.   RTC in 3 months for MD assessment, labs (CBC with diff, CMP, CA27.29), and +/- Procrit.   Honor Loh, NP  03/08/2018, 12:06 PM  I saw and evaluated the patient, participating in the key portions of the service and reviewing pertinent diagnostic studies and records.  I reviewed the nurse practitioner's note and agree with the findings and the plan.   Multiple questions were asked by the patient and answered.   Nolon Stalls, MD 03/08/2018,12:06 PM

## 2018-03-08 NOTE — Progress Notes (Signed)
Bianca Antigua, MD [address]  [place]  [place], [place] [address]  Main: [phone]  Fax: [phone]   Primary Care Physician: Bianca Hartigan, MD  Primary Gastroenterologist:  Bianca Shaw  Chief complaint: Follow-up for H. pylori and gastritis  HPI: Bianca Shaw is a 72 y.o. female with history of abdominal pain, gastric ulcers and H. pylori.  Abdominal pain completely resolved after H. pylori treatment with triple therapy after July 2019 EGD.  October 2019 EGD biopsies does not show any H. Pylori. The patient denies abdominal or flank pain, anorexia, nausea or vomiting, dysphagia, change in bowel habits or black or bloody stools or weight loss.  Patient denies any diarrhea.   Previous history: Underwent EGD on November 18, 2017 due to epigastric abdominal pain and nausea vomiting.  This showed many 3-4 nonobstructing nonbleeding superficial gastric ulcers, one with a pigmented material and heaped edges.  Localized mildly scalloped mucosa from the second portion of the duodenum as well.  DIAGNOSIS:  A. DUODENUM: COLD BIOPSY:  - SMALL BOWEL MUCOSA WITH INTACT VILLOUS ARCHITECTURE.  - NEGATIVE FOR INTRAEPITHELIAL LYMPHOCYTOSIS, DYSPLASIA AND MALIGNANCY.   B. 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  - NEGATIVE FOR DYSPLASIA AND MALIGNANCY.   C. STOMACH ULCER, PATCHY; COLD BIOPSY:  - MODERATE TO MARKED CHRONIC ACTIVE GASTRITIS WITH EXUDATE, CONSISTENT  WITH NEARBY ULCERATION (SEE NOTE).  - NEGATIVE FOR DYSPLASIA AND MALIGNANCY.   ADDENDUM:  Immunostain for H. pylori is positive in part C. Part B is negative.    Due to focal intestinal metaplasia seen on above EGD, EGD was repeated by Bianca Shaw in October 2019.  Findings:      The esophagus was normal.      One non-bleeding superficial duodenal ulcer with a clean ulcer base       (Forrest Class III) was found  in the duodenal bulb. The lesion was 6 mm       in largest dimension. Biopsies were taken with a cold forceps for       histology.      [Extent] mild mucosal changes characterized by white plaques were found       in the gastric antrum. Biopsies were taken with a cold forceps for       histology.      Diffuse nodular mucosa was found on the greater curvature of the gastric       antrum. Biopsies were taken with a cold forceps for histology. This was       location of prior large ulcers , no ulcers seen today      The cardia and gastric fundus were normal on retroflexion.  Impression:           - Normal esophagus.                       - One non-bleeding duodenal ulcer with a clean ulcer                        base (Forrest Class III). Biopsied.                       - Mucosal changes in the antrum. Biopsied.                       - Nodular mucosa  in the gastric antrum (greater                        curvature). Biopsied.  DIAGNOSIS:  A. DUODENAL BULB, EDGE OF ULCER; COLD BIOPSY:  - VILLOUS ATROPHY WITH MARKED ACTIVE INFLAMMATION, SEE COMMENT.   B. 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  - NEGATIVE FOR INTESTINAL METAPLASIA.   C. STOMACH, BODY, NODULAR APPEARANCE; COLD BIOPSY:  - MARKED CHRONIC ACTIVE GASTRITIS, SEE COMMENT.  - ONE TINY FRAGMENT WITH INTESTINAL METAPLASIA.   The gastric biopsies B and C are negative for H. pylori by  immunohistochemistry (IHC).   A, B, and C are negative for CMV by IHC.   IHC stain for cytokeratins (AE1/AE3/Cam5.2) was performed on A, B, and  C. The biopsies are negative for carcinoma cells in the lamina propria.   COLONOSCOPY: Last colonoscopy was in 2017 for screening by Bianca Shaw, and was normal except for diverticulosis and hemorrhoids, and repeat recommended in 10 years.  Current Outpatient Medications  Medication  Sig Dispense Refill  . albuterol (PROVENTIL) (2.5 MG/3ML) 0.083% nebulizer solution Take 3 mLs (2.5 mg total) by nebulization every 6 (six) hours as needed for wheezing or shortness of breath. (Patient not taking: Reported on 02/22/2018) 75 mL 12  . amLODipine (NORVASC) 2.5 MG tablet Take 1 tablet (2.5 mg total) by mouth daily. 30 tablet 0  . fluticasone (FLONASE) 50 MCG/ACT nasal spray Place 2 sprays into both nostrils daily. 16 g 0  . furosemide (LASIX) 20 MG tablet Take 20 mg by mouth daily.     . hydrALAZINE (APRESOLINE) 25 MG tablet Take 1 tablet (25 mg total) by mouth 2 (two) times daily. 60 tablet 11  . lisinopril (PRINIVIL,ZESTRIL) 20 MG tablet Take 1 tablet (20 mg total) by mouth daily. 30 tablet 2  . metoprolol succinate (TOPROL-XL) 25 MG 24 hr tablet Take 1 tablet (25 mg total) by mouth daily. 90 tablet 3  . nitroGLYCERIN (NITROSTAT) 0.4 MG SL tablet Place 1 tablet (0.4 mg total) under the tongue every 5 (five) minutes as needed for chest pain. (Patient not taking: Reported on 02/22/2018) 30 tablet 0  . potassium chloride SA (K-DUR,KLOR-CON) 20 MEQ tablet Take 1 tablet (20 mEq total) by mouth daily. 30 tablet 0  . predniSONE (DELTASONE) 20 MG tablet Take 20 mg by mouth daily.    . sodium chloride 1 g tablet Take 1 g by mouth 2 (two) times daily with a meal.     . tamoxifen (NOLVADEX) 20 MG tablet Take 1 tablet (20 mg total) by mouth daily. 90 tablet 0   No current facility-administered medications for this visit.     Allergies as of 03/08/2018 - Review Complete 03/08/2018  Allergen Reaction Noted  . Pimenta Nausea And Vomiting 11/04/2015  . Tomato  06/22/2017    ROS:  General: Negative for anorexia, weight loss, fever, chills, fatigue, weakness. ENT: Negative for hoarseness, difficulty swallowing , nasal congestion. CV: Negative for chest pain, angina, palpitations, dyspnea on exertion, peripheral edema.  Respiratory: Negative for dyspnea at rest, dyspnea on exertion, cough,  sputum, wheezing.  GI: See history of present illness. GU:  Negative for dysuria, hematuria, urinary incontinence, urinary frequency, nocturnal urination.  Endo: Negative for unusual weight change.    Physical Examination: Vitals:   03/08/18 1321  BP: (!) 160/64  Pulse: 66  Weight: 125  lb 6.4 oz (56.9 kg)  Height: 5\' 5"  (1.651 m)    General: Well-nourished, well-developed in no acute distress.  Eyes: No icterus. Conjunctivae pink. Mouth: Oropharyngeal mucosa moist and pink , no lesions erythema or exudate. Neck: Supple, Trachea midline Abdomen: Bowel sounds are normal, nontender, nondistended, no hepatosplenomegaly or masses, no abdominal bruits or hernia , no rebound or guarding.   Extremities: No lower extremity edema. No clubbing or deformities. Neuro: Alert and oriented x 3.  Grossly intact. Skin: Warm and dry, no jaundice.   Psych: Alert and cooperative, normal mood and affect.   Labs: CMP     Component Value Date/Time   NA 131 (L) 03/08/2018 0925   NA 127 (L) 05/24/2017 1613   NA 125 (L) 07/11/2013 1115   K 3.3 (L) 03/08/2018 0925   K 4.5 07/11/2013 1115   CL 102 03/08/2018 0925   CL 90 (L) 07/11/2013 1115   CO2 23 03/08/2018 0925   CO2 25 07/11/2013 1115   GLUCOSE 92 03/08/2018 0925   GLUCOSE 115 (H) 07/11/2013 1115   BUN 26 (H) 03/08/2018 0925   BUN 25 05/24/2017 1613   BUN 15 07/11/2013 1115   CREATININE 1.19 (H) 03/08/2018 0925   CREATININE 1.16 (H) 03/29/2017 1024   CALCIUM 9.4 03/08/2018 0925   CALCIUM 11.6 (H) 06/11/2017 1339   PROT 7.6 03/08/2018 0925   PROT 6.7 05/24/2017 1613   PROT 8.2 07/11/2013 1115   ALBUMIN 3.6 03/08/2018 0925   ALBUMIN 3.4 (L) 05/24/2017 1613   ALBUMIN 3.8 07/11/2013 1115   AST 19 03/08/2018 0925   AST 23 07/11/2013 1115   ALT 19 03/08/2018 0925   ALT 22 07/11/2013 1115   ALKPHOS 38 03/08/2018 0925   ALKPHOS 72 07/11/2013 1115   BILITOT 0.3 03/08/2018 0925   BILITOT 0.3 05/24/2017 1613   BILITOT 0.3 07/11/2013 1115    GFRNONAA 44 (L) 03/08/2018 0925   GFRNONAA 47 (L) 03/29/2017 1024   GFRAA 51 (L) 03/08/2018 0925   GFRAA 54 (L) 03/29/2017 1024   Lab Results  Component Value Date   WBC 11.6 (H) 03/08/2018   HGB 10.9 (L) 03/08/2018   HCT 33.5 (L) 03/08/2018   MCV 91.8 03/08/2018   PLT 268 03/08/2018    Imaging Studies: No results found.  Assessment and Plan:   Bianca Shaw is a 72 y.o. y/o female with history of abdominal pain completely resolved after July 2019 treatment for H. pylori noted on biopsies in July 2019 EGD which showed gastric ulcers as well here for follow-up  Patient's initial EGD in July 2019 did not report any villous atrophy  The villous atrophy seen in October 2019 duodenal biopsies are biopsies that were obtained from the site of a duodenal bulb ulcer.  No biopsies were done in the second portion of the duodenum at the time. In addition, the villous atrophy is mentioned to be present with marked active inflammation, which is also consistent with the site of the ulcer.  Therefore, the villous atrophy seen on October 2019 small bowel biopsies, and not present on July 2019 biopsies are most consistent with the finding being from the ulcer itself and less likely to be diffuse villous atrophy from something such as celiac.  In addition, patient does not have any diarrhea or iron deficiency or any other signs of celiac disease.  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  Therefore, will obtain celiac panel testing to evaluate for celiac disease.  Collagenous gastritis  was not mentioned in July 2019 gastric biopsies.  Her stomach ulcers were healed and October 2019 biopsies, and it may be that the differences in histological findings from July 2019 to October 2019 biopsies may have to do with the healing process.  Collagenous sprue usually shows collagen deposition in the small bowel, however, collagenous gastritis is rare can occur.  Again, she does  not have any symptoms from this.  No diarrhea, no iron deficiency, no abdominal pain, no signs of celiac disease, colitis, gastritis.  Repeat EGD with biopsies can be done in the future to see how this has changed.  Patient does not have any alarm symptoms otherwise.  We will also send patient slides for second opinion to GI pathologist, Bianca Shaw at [facility].  After reviewing slides with him, can proceed with EGD if needed.  Patient is not on any PPI and is completely asymptomatic without it.  Dr Bianca Shaw

## 2018-03-08 NOTE — Progress Notes (Signed)
Information to send slides from pathology on 7/29 and 10/22 to [facility] faxed to [facility] lab, [employer]. Confirmation received.

## 2018-03-09 ENCOUNTER — Other Ambulatory Visit: Payer: Self-pay

## 2018-03-09 ENCOUNTER — Telehealth: Payer: Self-pay | Admitting: *Deleted

## 2018-03-09 DIAGNOSIS — I10 Essential (primary) hypertension: Secondary | ICD-10-CM | POA: Diagnosis not present

## 2018-03-09 DIAGNOSIS — E871 Hypo-osmolality and hyponatremia: Secondary | ICD-10-CM | POA: Diagnosis not present

## 2018-03-09 DIAGNOSIS — E222 Syndrome of inappropriate secretion of antidiuretic hormone: Secondary | ICD-10-CM | POA: Diagnosis not present

## 2018-03-09 DIAGNOSIS — N183 Chronic kidney disease, stage 3 (moderate): Secondary | ICD-10-CM | POA: Diagnosis not present

## 2018-03-09 MED ORDER — POTASSIUM CHLORIDE CRYS ER 20 MEQ PO TBCR: 20.0000 meq | EXTENDED_RELEASE_TABLET | Freq: Two times a day (BID) | ORAL | 3 refills | Status: DC | PRN

## 2018-03-09 NOTE — Telephone Encounter (Signed)
-----   Message from [name], NP sent at 03/09/2018 12:18 PM EST ----- B12 level is low. Last time we checked it, her level was HIGH.   Options are oral 1000 mcg daily, or start injections. How would she like to proceed?  Bianca Shaw

## 2018-03-09 NOTE — Telephone Encounter (Signed)
Called patient and LVM to inform patient that her B-12 was low on yesterday's lab draw.  The last time it was drawn it was too high.  MD would like to know if patient would want to take 1000 mcg oral B-12 daily or have monthly B-12 injections.  Asked patient to give me a call to let me know which option she prefers.

## 2018-03-10 ENCOUNTER — Telehealth: Payer: Self-pay | Admitting: *Deleted

## 2018-03-10 DIAGNOSIS — K2980 Duodenitis without bleeding: Secondary | ICD-10-CM | POA: Diagnosis not present

## 2018-03-10 LAB — CELIAC PANEL 10
Antigliadin Abs, IgA: 9 units (ref 0–19)
Endomysial IgA: NEGATIVE
GLIADIN IGG: 3 U (ref 0–19)
IGA/IMMUNOGLOBULIN A, SERUM: 297 mg/dL (ref 64–422)
TISSUE TRANSGLUT AB: 2 U/mL (ref 0–5)

## 2018-03-10 NOTE — Telephone Encounter (Signed)
Phone call returned to Bianca Shaw and informed per VO Dr Humberto Seals to take over the counter B 12. He repeated this back to me

## 2018-03-10 NOTE — Telephone Encounter (Signed)
Patient's husband called back stating patient prefers taking oral B-12.  Triage RN, Hassan Rowan to call back to inform them they can buy it OTC.

## 2018-03-10 NOTE — Telephone Encounter (Signed)
Bianca Shaw called to state that they prefer the B12 1000 mcg tabs over the injection and asks it be sent to [employer]

## 2018-03-10 NOTE — Telephone Encounter (Signed)
-----   Message from [name], NP sent at 03/09/2018 12:18 PM EST ----- B12 level is low. Last time we checked it, her level was HIGH.   Options are oral 1000 mcg daily, or start injections. How would she like to proceed?  Bianca Shaw

## 2018-03-17 DIAGNOSIS — I10 Essential (primary) hypertension: Secondary | ICD-10-CM | POA: Diagnosis not present

## 2018-03-17 DIAGNOSIS — R809 Proteinuria, unspecified: Secondary | ICD-10-CM | POA: Diagnosis not present

## 2018-03-18 ENCOUNTER — Encounter: Payer: Self-pay | Admitting: Gastroenterology

## 2018-03-22 ENCOUNTER — Inpatient Hospital Stay: Payer: PPO

## 2018-03-22 DIAGNOSIS — N189 Chronic kidney disease, unspecified: Secondary | ICD-10-CM | POA: Diagnosis not present

## 2018-03-22 DIAGNOSIS — C50412 Malignant neoplasm of upper-outer quadrant of left female breast: Secondary | ICD-10-CM

## 2018-03-22 LAB — CBC WITH DIFFERENTIAL/PLATELET
Abs Immature Granulocytes: 0.03 10*3/uL (ref 0.00–0.07)
Basophils Absolute: 0.1 10*3/uL (ref 0.0–0.1)
Basophils Relative: 1 %
Eosinophils Absolute: 0.4 10*3/uL (ref 0.0–0.5)
Eosinophils Relative: 5 %
HCT: 33 % — ABNORMAL LOW (ref 36.0–46.0)
Hemoglobin: 10.8 g/dL — ABNORMAL LOW (ref 12.0–15.0)
Immature Granulocytes: 0 %
Lymphocytes Relative: 9 %
Lymphs Abs: 0.7 10*3/uL (ref 0.7–4.0)
MCH: 29.9 pg (ref 26.0–34.0)
MCHC: 32.7 g/dL (ref 30.0–36.0)
MCV: 91.4 fL (ref 80.0–100.0)
Monocytes Absolute: 0.9 10*3/uL (ref 0.1–1.0)
Monocytes Relative: 11 %
Neutro Abs: 5.8 10*3/uL (ref 1.7–7.7)
Neutrophils Relative %: 74 %
Platelets: 186 10*3/uL (ref 150–400)
RBC: 3.61 MIL/uL — ABNORMAL LOW (ref 3.87–5.11)
RDW: 15.8 % — ABNORMAL HIGH (ref 11.5–15.5)
WBC: 7.9 10*3/uL (ref 4.0–10.5)
nRBC: 0 % (ref 0.0–0.2)

## 2018-03-29 DIAGNOSIS — E871 Hypo-osmolality and hyponatremia: Secondary | ICD-10-CM | POA: Diagnosis not present

## 2018-03-29 DIAGNOSIS — E222 Syndrome of inappropriate secretion of antidiuretic hormone: Secondary | ICD-10-CM | POA: Diagnosis not present

## 2018-03-29 DIAGNOSIS — N183 Chronic kidney disease, stage 3 (moderate): Secondary | ICD-10-CM | POA: Diagnosis not present

## 2018-04-05 ENCOUNTER — Inpatient Hospital Stay: Payer: PPO | Attending: Hematology and Oncology

## 2018-04-05 ENCOUNTER — Telehealth: Payer: Self-pay

## 2018-04-05 ENCOUNTER — Other Ambulatory Visit: Payer: Self-pay | Admitting: Hematology and Oncology

## 2018-04-05 ENCOUNTER — Inpatient Hospital Stay: Payer: PPO

## 2018-04-05 DIAGNOSIS — N184 Chronic kidney disease, stage 4 (severe): Secondary | ICD-10-CM | POA: Insufficient documentation

## 2018-04-05 DIAGNOSIS — D631 Anemia in chronic kidney disease: Secondary | ICD-10-CM | POA: Insufficient documentation

## 2018-04-05 DIAGNOSIS — C50912 Malignant neoplasm of unspecified site of left female breast: Secondary | ICD-10-CM | POA: Diagnosis not present

## 2018-04-05 DIAGNOSIS — Z79899 Other long term (current) drug therapy: Secondary | ICD-10-CM | POA: Insufficient documentation

## 2018-04-05 DIAGNOSIS — Z170 Estrogen receptor positive status [ER+]: Secondary | ICD-10-CM | POA: Diagnosis not present

## 2018-04-05 DIAGNOSIS — M810 Age-related osteoporosis without current pathological fracture: Secondary | ICD-10-CM | POA: Diagnosis not present

## 2018-04-05 DIAGNOSIS — N189 Chronic kidney disease, unspecified: Secondary | ICD-10-CM | POA: Diagnosis present

## 2018-04-05 DIAGNOSIS — C50412 Malignant neoplasm of upper-outer quadrant of left female breast: Secondary | ICD-10-CM

## 2018-04-05 LAB — CBC WITH DIFFERENTIAL/PLATELET
Abs Immature Granulocytes: 0.03 10*3/uL (ref 0.00–0.07)
Basophils Absolute: 0.1 10*3/uL (ref 0.0–0.1)
Basophils Relative: 1 %
Eosinophils Absolute: 0.3 10*3/uL (ref 0.0–0.5)
Eosinophils Relative: 6 %
HCT: 29.6 % — ABNORMAL LOW (ref 36.0–46.0)
Hemoglobin: 10.1 g/dL — ABNORMAL LOW (ref 12.0–15.0)
Immature Granulocytes: 1 %
Lymphocytes Relative: 12 %
Lymphs Abs: 0.6 10*3/uL — ABNORMAL LOW (ref 0.7–4.0)
MCH: 31.1 pg (ref 26.0–34.0)
MCHC: 34.1 g/dL (ref 30.0–36.0)
MCV: 91.1 fL (ref 80.0–100.0)
Monocytes Absolute: 0.8 10*3/uL (ref 0.1–1.0)
Monocytes Relative: 16 %
Neutro Abs: 3.1 10*3/uL (ref 1.7–7.7)
Neutrophils Relative %: 64 %
Platelets: 281 10*3/uL (ref 150–400)
RBC: 3.25 MIL/uL — ABNORMAL LOW (ref 3.87–5.11)
RDW: 15.8 % — ABNORMAL HIGH (ref 11.5–15.5)
WBC: 4.8 10*3/uL (ref 4.0–10.5)
nRBC: 0 % (ref 0.0–0.2)

## 2018-04-05 NOTE — Telephone Encounter (Signed)
-----   Message from Virgel Manifold, MD sent at 03/21/2018  3:33 PM EST ----- Jackelyn Poling please let patient know, we sent her slides for review with a second pathologist, and it does not show any H. pylori.  However, given the changes noted on the biopsies, repeat EGD in 2 to 3 months from her last EGD for further biopsies would be beneficial to see if these changes have completely resolved or healed or if there is still present.  Please schedule.

## 2018-04-05 NOTE — Telephone Encounter (Signed)
LMTCO.

## 2018-04-06 ENCOUNTER — Other Ambulatory Visit: Payer: Self-pay

## 2018-04-06 DIAGNOSIS — Z8719 Personal history of other diseases of the digestive system: Secondary | ICD-10-CM

## 2018-04-06 DIAGNOSIS — Z8711 Personal history of peptic ulcer disease: Secondary | ICD-10-CM

## 2018-04-06 NOTE — Telephone Encounter (Signed)
Pt notified. EGD scheduled for 05/10/2018, last EGD in October, 2019. Pt requested [place] location.

## 2018-04-14 DIAGNOSIS — E871 Hypo-osmolality and hyponatremia: Secondary | ICD-10-CM | POA: Diagnosis not present

## 2018-04-19 ENCOUNTER — Inpatient Hospital Stay: Payer: PPO

## 2018-04-19 DIAGNOSIS — C50412 Malignant neoplasm of upper-outer quadrant of left female breast: Secondary | ICD-10-CM

## 2018-04-19 DIAGNOSIS — C50912 Malignant neoplasm of unspecified site of left female breast: Secondary | ICD-10-CM | POA: Diagnosis not present

## 2018-04-19 LAB — CBC WITH DIFFERENTIAL/PLATELET
Abs Immature Granulocytes: 0.02 10*3/uL (ref 0.00–0.07)
Basophils Absolute: 0.1 10*3/uL (ref 0.0–0.1)
Basophils Relative: 1 %
Eosinophils Absolute: 0.3 10*3/uL (ref 0.0–0.5)
Eosinophils Relative: 5 %
HCT: 32.7 % — ABNORMAL LOW (ref 36.0–46.0)
Hemoglobin: 10.7 g/dL — ABNORMAL LOW (ref 12.0–15.0)
Immature Granulocytes: 0 %
Lymphocytes Relative: 8 %
Lymphs Abs: 0.6 10*3/uL — ABNORMAL LOW (ref 0.7–4.0)
MCH: 30.5 pg (ref 26.0–34.0)
MCHC: 32.7 g/dL (ref 30.0–36.0)
MCV: 93.2 fL (ref 80.0–100.0)
Monocytes Absolute: 0.9 10*3/uL (ref 0.1–1.0)
Monocytes Relative: 12 %
Neutro Abs: 5.4 10*3/uL (ref 1.7–7.7)
Neutrophils Relative %: 74 %
Platelets: 290 10*3/uL (ref 150–400)
RBC: 3.51 MIL/uL — ABNORMAL LOW (ref 3.87–5.11)
RDW: 16.6 % — ABNORMAL HIGH (ref 11.5–15.5)
WBC: 7.3 10*3/uL (ref 4.0–10.5)
nRBC: 0 % (ref 0.0–0.2)

## 2018-05-03 ENCOUNTER — Encounter: Payer: Self-pay | Admitting: *Deleted

## 2018-05-03 ENCOUNTER — Inpatient Hospital Stay: Payer: PPO

## 2018-05-03 ENCOUNTER — Inpatient Hospital Stay: Payer: PPO | Attending: Hematology and Oncology

## 2018-05-03 ENCOUNTER — Other Ambulatory Visit: Payer: Self-pay

## 2018-05-03 VITALS — BP 141/56 | HR 62

## 2018-05-03 DIAGNOSIS — C50912 Malignant neoplasm of unspecified site of left female breast: Secondary | ICD-10-CM | POA: Diagnosis not present

## 2018-05-03 DIAGNOSIS — N184 Chronic kidney disease, stage 4 (severe): Secondary | ICD-10-CM | POA: Diagnosis not present

## 2018-05-03 DIAGNOSIS — D631 Anemia in chronic kidney disease: Secondary | ICD-10-CM | POA: Diagnosis not present

## 2018-05-03 DIAGNOSIS — Z170 Estrogen receptor positive status [ER+]: Secondary | ICD-10-CM | POA: Diagnosis not present

## 2018-05-03 DIAGNOSIS — Z79899 Other long term (current) drug therapy: Secondary | ICD-10-CM | POA: Diagnosis not present

## 2018-05-03 DIAGNOSIS — M810 Age-related osteoporosis without current pathological fracture: Secondary | ICD-10-CM | POA: Insufficient documentation

## 2018-05-03 DIAGNOSIS — N189 Chronic kidney disease, unspecified: Secondary | ICD-10-CM

## 2018-05-03 DIAGNOSIS — C50412 Malignant neoplasm of upper-outer quadrant of left female breast: Secondary | ICD-10-CM

## 2018-05-03 LAB — CBC WITH DIFFERENTIAL/PLATELET
Abs Immature Granulocytes: 0.04 10*3/uL (ref 0.00–0.07)
Basophils Absolute: 0.1 10*3/uL (ref 0.0–0.1)
Basophils Relative: 2 %
Eosinophils Absolute: 0.3 10*3/uL (ref 0.0–0.5)
Eosinophils Relative: 5 %
HCT: 29.7 % — ABNORMAL LOW (ref 36.0–46.0)
Hemoglobin: 9.7 g/dL — ABNORMAL LOW (ref 12.0–15.0)
Immature Granulocytes: 1 %
Lymphocytes Relative: 13 %
Lymphs Abs: 0.8 10*3/uL (ref 0.7–4.0)
MCH: 30.7 pg (ref 26.0–34.0)
MCHC: 32.7 g/dL (ref 30.0–36.0)
MCV: 94 fL (ref 80.0–100.0)
Monocytes Absolute: 0.8 10*3/uL (ref 0.1–1.0)
Monocytes Relative: 13 %
Neutro Abs: 4.1 10*3/uL (ref 1.7–7.7)
Neutrophils Relative %: 66 %
Platelets: 314 10*3/uL (ref 150–400)
RBC: 3.16 MIL/uL — ABNORMAL LOW (ref 3.87–5.11)
RDW: 16.1 % — ABNORMAL HIGH (ref 11.5–15.5)
WBC: 6.1 10*3/uL (ref 4.0–10.5)
nRBC: 0 % (ref 0.0–0.2)

## 2018-05-03 MED ORDER — EPOETIN ALFA 10000 UNIT/ML IJ SOLN
10000.0000 [IU] | Freq: Once | INTRAMUSCULAR | Status: AC
  Administered 2018-05-03: 10000 [IU] via SUBCUTANEOUS

## 2018-05-04 NOTE — Anesthesia Preprocedure Evaluation (Addendum)
Anesthesia Evaluation  Patient identified by MRN, date of birth, ID band Patient awake    Reviewed: Allergy & Precautions, H&P , NPO status , Patient's Chart, lab work & pertinent test results  Airway Mallampati: II  TM Distance: >3 FB Neck ROM: full    Dental  (+) Edentulous Upper, Edentulous Lower   Pulmonary COPD, former smoker (quit 2005),    Pulmonary exam normal breath sounds clear to auscultation       Cardiovascular hypertension, + angina + CAD and + Past MI  Normal cardiovascular exam  Hx pericarditis 09/2008  ECG 02/22/18: Sinus bradycardia, otherwise normal   Neuro/Psych PSYCHIATRIC DISORDERS Anxiety Dementia negative neurological ROS     GI/Hepatic GERD  ,  Endo/Other    Renal/GU 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, controlled with free water restriction and a daily salt tab  negative genitourinary   Musculoskeletal   Abdominal   Peds  Hematology  (+) Blood dyscrasia, anemia , Breast CA   Anesthesia Other Findings   Reproductive/Obstetrics negative OB ROS                            Anesthesia Physical Anesthesia Plan  ASA: II  Anesthesia Plan: General   Post-op Pain Management:    Induction:   PONV Risk Score and Plan:   Airway Management Planned:   Additional Equipment:   Intra-op Plan:   Post-operative Plan:   Informed Consent: I have reviewed the patients History and Physical, chart, labs and discussed the procedure including the risks, benefits and alternatives for the proposed anesthesia with the patient or authorized representative who has indicated his/her understanding and acceptance.     Plan Discussed with:   Anesthesia Plan Comments:         Anesthesia Quick Evaluation

## 2018-05-05 NOTE — Discharge Instructions (Signed)
General Anesthesia, Adult, Care After  This sheet gives you information about how to care for yourself after your procedure. Your health care provider may also give you more specific instructions. If you have problems or questions, contact your health care provider.  What can I expect after the procedure?  After the procedure, the following side effects are common:  Pain or discomfort at the IV site.  Nausea.  Vomiting.  Sore throat.  Trouble concentrating.  Feeling cold or chills.  Weak or tired.  Sleepiness and fatigue.  Soreness and body aches. These side effects can affect parts of the body that were not involved in surgery.  Follow these instructions at home:    For at least 24 hours after the procedure:  Have a responsible adult stay with you. It is important to have someone help care for you until you are awake and alert.  Rest as needed.  Do not:  Participate in activities in which you could fall or become injured.  Drive.  Use heavy machinery.  Drink alcohol.  Take sleeping pills or medicines that cause drowsiness.  Make important decisions or sign legal documents.  Take care of children on your own.  Eating and drinking  Follow any instructions from your health care provider about eating or drinking restrictions.  When you feel hungry, start by eating small amounts of foods that are soft and easy to digest (bland), such as toast. Gradually return to your regular diet.  Drink enough fluid to keep your urine pale yellow.  If you vomit, rehydrate by drinking water, juice, or clear broth.  General instructions  If you have sleep apnea, surgery and certain medicines can increase your risk for breathing problems. Follow instructions from your health care provider about wearing your sleep device:  Anytime you are sleeping, including during daytime naps.  While taking prescription pain medicines, sleeping medicines, or medicines that make you drowsy.  Return to your normal activities as told by your health care  provider. Ask your health care provider what activities are safe for you.  Take over-the-counter and prescription medicines only as told by your health care provider.  If you smoke, do not smoke without supervision.  Keep all follow-up visits as told by your health care provider. This is important.  Contact a health care provider if:  You have nausea or vomiting that does not get better with medicine.  You cannot eat or drink without vomiting.  You have pain that does not get better with medicine.  You are unable to pass urine.  You develop a skin rash.  You have a fever.  You have redness around your IV site that gets worse.  Get help right away if:  You have difficulty breathing.  You have chest pain.  You have blood in your urine or stool, or you vomit blood.  Summary  After the procedure, it is common to have a sore throat or nausea. It is also common to feel tired.  Have a responsible adult stay with you for the first 24 hours after general anesthesia. It is important to have someone help care for you until you are awake and alert.  When you feel hungry, start by eating small amounts of foods that are soft and easy to digest (bland), such as toast. Gradually return to your regular diet.  Drink enough fluid to keep your urine pale yellow.  Return to your normal activities as told by your health care provider. Ask your health care   provider what activities are safe for you.  This information is not intended to replace advice given to you by your health care provider. Make sure you discuss any questions you have with your health care provider.  Document Released: 07/20/2000 Document Revised: 11/27/2016 Document Reviewed: 11/27/2016  Elsevier Interactive Patient Education  2019 [employer]

## 2018-05-10 ENCOUNTER — Ambulatory Visit: Payer: PPO | Admitting: Anesthesiology

## 2018-05-10 ENCOUNTER — Ambulatory Visit
Admission: RE | Admit: 2018-05-10 | Discharge: 2018-05-10 | Disposition: A | Discharge status: Home / Self Care | Payer: PPO | Attending: Gastroenterology | Admitting: Gastroenterology

## 2018-05-10 ENCOUNTER — Encounter
Admission: RE | Disposition: A | Discharge status: Home / Self Care | Payer: Self-pay | Source: Home / Self Care | Attending: Gastroenterology

## 2018-05-10 DIAGNOSIS — K219 Gastro-esophageal reflux disease without esophagitis: Secondary | ICD-10-CM | POA: Insufficient documentation

## 2018-05-10 DIAGNOSIS — Z79899 Other long term (current) drug therapy: Secondary | ICD-10-CM | POA: Diagnosis not present

## 2018-05-10 DIAGNOSIS — K3189 Other diseases of stomach and duodenum: Secondary | ICD-10-CM | POA: Diagnosis not present

## 2018-05-10 DIAGNOSIS — K2289 Other specified disease of esophagus: Secondary | ICD-10-CM

## 2018-05-10 DIAGNOSIS — K228 Other specified diseases of esophagus: Secondary | ICD-10-CM | POA: Insufficient documentation

## 2018-05-10 DIAGNOSIS — N189 Chronic kidney disease, unspecified: Secondary | ICD-10-CM | POA: Diagnosis not present

## 2018-05-10 DIAGNOSIS — Z87891 Personal history of nicotine dependence: Secondary | ICD-10-CM | POA: Diagnosis not present

## 2018-05-10 DIAGNOSIS — Z8711 Personal history of peptic ulcer disease: Secondary | ICD-10-CM | POA: Diagnosis not present

## 2018-05-10 DIAGNOSIS — R1013 Epigastric pain: Secondary | ICD-10-CM

## 2018-05-10 DIAGNOSIS — F419 Anxiety disorder, unspecified: Secondary | ICD-10-CM | POA: Diagnosis not present

## 2018-05-10 DIAGNOSIS — Z853 Personal history of malignant neoplasm of breast: Secondary | ICD-10-CM | POA: Insufficient documentation

## 2018-05-10 DIAGNOSIS — I129 Hypertensive chronic kidney disease with stage 1 through stage 4 chronic kidney disease, or unspecified chronic kidney disease: Secondary | ICD-10-CM | POA: Diagnosis not present

## 2018-05-10 DIAGNOSIS — Z8719 Personal history of other diseases of the digestive system: Secondary | ICD-10-CM | POA: Diagnosis not present

## 2018-05-10 DIAGNOSIS — K259 Gastric ulcer, unspecified as acute or chronic, without hemorrhage or perforation: Secondary | ICD-10-CM | POA: Diagnosis not present

## 2018-05-10 HISTORY — PX: ESOPHAGOGASTRODUODENOSCOPY (EGD) WITH PROPOFOL: SHX5813

## 2018-05-10 HISTORY — DX: Chronic kidney disease, unspecified: N18.9

## 2018-05-10 SURGERY — ESOPHAGOGASTRODUODENOSCOPY (EGD) WITH PROPOFOL
Anesthesia: General | Site: Throat

## 2018-05-10 MED ORDER — PROPOFOL 10 MG/ML IV BOLUS
INTRAVENOUS | Status: DC | PRN
  Administered 2018-05-10: 30 mg via INTRAVENOUS
  Administered 2018-05-10 (×6): 20 mg via INTRAVENOUS
  Administered 2018-05-10: 150 mg via INTRAVENOUS

## 2018-05-10 MED ORDER — LIDOCAINE HCL (CARDIAC) PF 100 MG/5ML IV SOSY
PREFILLED_SYRINGE | INTRAVENOUS | Status: DC | PRN
  Administered 2018-05-10: 50 mg via INTRAVENOUS

## 2018-05-10 MED ORDER — STERILE WATER FOR IRRIGATION IR SOLN
Status: DC | PRN
  Administered 2018-05-10: 09:00:00

## 2018-05-10 MED ORDER — OXYCODONE HCL 5 MG/5ML PO SOLN: 5.0000 mg | Freq: Once | ORAL | Status: DC | PRN

## 2018-05-10 MED ORDER — SODIUM CHLORIDE 0.9 % IV SOLN: INTRAVENOUS | Status: DC

## 2018-05-10 MED ORDER — FENTANYL CITRATE (PF) 100 MCG/2ML IJ SOLN: 25.0000 ug | INTRAMUSCULAR | Status: DC | PRN

## 2018-05-10 MED ORDER — GLYCOPYRROLATE 0.2 MG/ML IJ SOLN
INTRAMUSCULAR | Status: DC | PRN
  Administered 2018-05-10: 0.1 mg via INTRAVENOUS

## 2018-05-10 MED ORDER — LACTATED RINGERS IV SOLN
INTRAVENOUS | Status: DC
  Administered 2018-05-10: 08:00:00 via INTRAVENOUS

## 2018-05-10 MED ORDER — ONDANSETRON HCL 4 MG/2ML IJ SOLN: 4.0000 mg | Freq: Once | INTRAMUSCULAR | Status: DC | PRN

## 2018-05-10 MED ORDER — OXYCODONE HCL 5 MG PO TABS: 5.0000 mg | ORAL_TABLET | Freq: Once | ORAL | Status: DC | PRN

## 2018-05-10 SURGICAL SUPPLY — 7 items
BLOCK BITE 60FR ADLT L/F GRN (MISCELLANEOUS) ×3 IMPLANT
CANISTER SUCT 1200ML W/VALVE (MISCELLANEOUS) ×3 IMPLANT
FORCEPS BIOP RAD 4 LRG CAP 4 (CUTTING FORCEPS) ×2 IMPLANT
GOWN CVR UNV OPN BCK APRN NK (MISCELLANEOUS) ×2 IMPLANT
GOWN ISOL THUMB LOOP REG UNIV (MISCELLANEOUS) ×6
KIT ENDO PROCEDURE OLY (KITS) ×3 IMPLANT
WATER STERILE IRR 250ML POUR (IV SOLUTION) ×3 IMPLANT

## 2018-05-10 NOTE — Op Note (Signed)
[facility] Patient Name: Bianca Shaw Procedure Date: 05/10/2018 8:50 AM MRN: [id number] Account #: [id number] Date of Birth: 10-30-1945 Admit Type: Outpatient Age: 73 Room: [facility] OR ROOM 01 Gender: Female Note Status: Finalized Procedure:            Upper GI endoscopy Indications:          Epigastric abdominal pain, Follow-up of gastric ulcer,                        Pt was initially seen in the office for abdominal pain                        and nausea and was found to have gastric ulcers which                        improved after PPI on 2nd EGD. 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 Providers:            Lennette Bihari. Bonna Gains MD, MD Referring MD:         Sofie Hartigan (Referring MD) Medicines:            Monitored Anesthesia Care Complications:        No immediate complications. Procedure:            Pre-Anesthesia Assessment:                       - Prior to the procedure, a History and Physical was                        performed, and patient medications, allergies and                        sensitivities were reviewed. The patient's tolerance of                        previous anesthesia was reviewed.                       - The risks and benefits of the procedure and the                        sedation options and risks were discussed with the                        patient. All questions were answered and informed                        consent was obtained.                       - Patient identification and proposed procedure were                        verified prior to the procedure by the physician, the  nurse, the anesthesiologist, the anesthetist and the                        technician. The procedure was verified in the procedure                        room.                       -  ASA Grade Assessment: II - A patient with mild                        systemic disease.                       After obtaining informed consent, the endoscope was                        passed under direct vision. Throughout the procedure,                        the patient's blood pressure, pulse, and oxygen                        saturations were monitored continuously. The was                        introduced through the mouth, and advanced to the                        second part of duodenum. The upper GI endoscopy was                        accomplished with ease. The patient tolerated the                        procedure well. Findings:      The Z-line was irregular. Mucosa was biopsied with a cold forceps for       histology in 4 quadrants at the gastroesophageal junction.      Patchy mildly erythematous mucosa without bleeding was found in the       gastric antrum. Biopsies were taken with a cold forceps for histology.       Biopsies were obtained on the greater curvature of the gastric body, on       the lesser curvature of the gastric body, at the incisura, on the       greater curvature of the gastric antrum and on the lesser curvature of       the gastric antrum with cold forceps for histology.      There is no endoscopic evidence of ulceration in the stomach.      The duodenal bulb, second portion of the duodenum and examined duodenum       were normal. Biopsies for histology were taken with a cold forceps for       evaluation of celiac disease. Biopsies were obtained in the duodenal       bulb and in the second portion of the duodenum with cold forceps for       histology.      There is no endoscopic evidence of ulceration in the entire examined  duodenum. Impression:           - Z-line irregular. Biopsied.                       - Erythematous mucosa in the antrum. Biopsied.                       - Normal duodenal bulb, second portion of the duodenum                         and examined duodenum. Biopsied.                       - Biopsies were obtained on the greater curvature of                        the gastric body, on the lesser curvature of the                        gastric body, at the incisura, on the greater curvature                        of the gastric antrum and on the lesser curvature of                        the gastric antrum.                       - Biopsies were obtained in the duodenal bulb and in                        the second portion of the duodenum. Recommendation:       - Await pathology results.                       - Discharge patient to home (with escort).                       - Advance diet as tolerated.                       - Continue present medications.                       - Patient has a contact number available for                        emergencies. The signs and symptoms of potential                        delayed complications were discussed with the patient.                        Return to normal activities tomorrow. Written discharge                        instructions were provided to the patient.                       - Discharge patient to home (with escort).                       -  The findings and recommendations were discussed with                        the patient.                       - The findings and recommendations were discussed with                        the patient's family. Procedure Code(s):    --- Professional ---                       [phone], Esophagogastroduodenoscopy, flexible, transoral;                        with biopsy, single or multiple Diagnosis Code(s):    --- Professional ---                       K22.8, Other specified diseases of esophagus                       K31.89, Other diseases of stomach and duodenum                       R10.13, Epigastric pain                       K25.9, Gastric ulcer, unspecified as acute or chronic,                        without hemorrhage  or perforation CPT copyright 2018 American Medical Association. All rights reserved. The codes documented in this report are preliminary and upon coder review may  be revised to meet current compliance requirements.  Vonda Antigua, MD Margretta Sidle B. Bonna Gains MD, MD 05/10/2018 9:28:18 AM This report has been signed electronically. Number of Addenda: 0 Note Initiated On: 05/10/2018 8:50 AM Total Procedure Duration: 0 hours 14 minutes 27 seconds  Estimated Blood Loss: Estimated blood loss: none.      [facility]

## 2018-05-10 NOTE — H&P (Signed)
Vonda Antigua, MD [address] [place], [place], [place], [address] [id number], [place], [place], [place], [address] Phone: [phone]  Fax: [phone]  Primary Care Physician:  Sofie Hartigan, MD   Pre-Procedure History & Physical: HPI:  Bianca Shaw is a 73 y.o. female is here for an EGD.   Past Medical History:  Diagnosis Date  . Anemia   . Anxiety disorder   . Breast cancer of upper-outer quadrant of left female breast ([facility]) 11/2015   pT2 pN0(i+).;ER+; PR +, her 2 neu not overexpressed.  Mastectomy, SLN, Mammoprint: Low risk.   . Cancer ([facility]) 12/03/2015   left breast/ INVASIVE LOBULAR CARCINOMA.   . Chronic kidney disease   . Cough    lingering, mild, finished Prednisone and anitbiotic 11/03/15  . Family history of adverse reaction to anesthesia    sister - PONV  . GERD (gastroesophageal reflux disease)   . Hypertension   . Osteopenia   . Osteoporosis   . Pericarditis    diagnonsed June, 2010, unclear etiology as of yer  . Personal history of tobacco use, presenting hazards to health 10/31/2015  . Scleroderma ([facility])    ONLY ON SKIN-MILD  . UTI (lower urinary tract infection)   . Wears dentures    full upper    Past Surgical History:  Procedure Laterality Date  . BREAST BIOPSY Right 2012   core - neg  . BREAST BIOPSY Left 12/03/2015   INVASIVE LOBULAR CARCINOMA.   Marland Kitchen CARDIAC CATHETERIZATION    . CATARACT EXTRACTION W/ INTRAOCULAR LENS IMPLANT Right   . COLONOSCOPY WITH PROPOFOL N/A 11/08/2015   Procedure: COLONOSCOPY WITH PROPOFOL;  Surgeon: Lucilla Lame, MD;  Location: [place];  Service: Endoscopy;  Laterality: N/A;  . ESOPHAGOGASTRODUODENOSCOPY (EGD) WITH PROPOFOL N/A 11/18/2017   Procedure: ESOPHAGOGASTRODUODENOSCOPY (EGD) WITH PROPOFOL;  Surgeon: Virgel Manifold, MD;  Location: [facility] ENDOSCOPY;  Service: Endoscopy;  Laterality: N/A;  . ESOPHAGOGASTRODUODENOSCOPY (EGD) WITH PROPOFOL N/A 02/15/2018   Procedure:  ESOPHAGOGASTRODUODENOSCOPY (EGD) WITH PROPOFOL;  Surgeon: Jonathon Bellows, MD;  Location[facility] END[facility]COPY;  Service: Gastroenterology;  Laterality: N/A;  . EVACUATION BREAST HEMATOMA Left 01/14/2016   Procedure: EVACUATION HEMATOMA BREAST;  Surgeon: Robert Bellow, MD;  Location: [facility] ORS;  Service: General;  Laterality: Left;  . EYE SURGERY    . MASTECTOMY Left 2017   complete mastectomy  . MASTECTOMY W/ SENTINEL NODE BIOPSY Left 12/26/2015   Procedure: MASTECTOMY WITH SENTINEL LYMPH NODE BIOPSY;  Surgeon: Robert Bellow, MD;  Location: [facility] ORS;  Service: General;  Laterality: Left;  . RIGHT/LEFT HEART CATH AND CORONARY ANGIOGRAPHY N/A 07/22/2016   Procedure: Right/Left Heart Cath and Coronary Angiography;  Surgeon: Minna Merritts, MD;  Location: [facility];  Service: Cardiovascular;  Laterality: N/A;  . TUBAL LIGATION    . VESICOVAGINAL FISTULA CLOSURE W/ TAH      Prior to Admission medications   Medication Sig Start Date End Date Taking? Authorizing Provider  albuterol (PROVENTIL) (2.5 MG/3ML) 0.083% nebulizer solution Take 3 mLs (2.5 mg total) by nebulization every 6 (six) hours as needed for wheezing or shortness of breath. 06/06/15  Yes Mar Daring, PA-C  amLODipine (NORVASC) 2.5 MG tablet Take 1 tablet (2.5 mg total) by mouth daily. 02/23/18  Yes Mayo, Pete Pelt, MD  fluticasone [facility]) 50 MCG/ACT nasal spray Place 2 sprays into both nostrils daily. 02/23/18  Yes Mayo, Pete Pelt, MD  furosemide (LASIX) 20 MG tablet Take 20 mg by mouth daily.  11/08/17  Yes [provider]  hydrALAZINE (APRESOLINE) 25 MG tablet Take 1 tablet (25 mg total) by mouth 2 (two) times daily. 04/29/17  Yes Jerrol Banana., MD  lisinopril (PRINIVIL,ZESTRIL) 20 MG tablet Take 1 tablet (20 mg total) by mouth daily. 06/26/17  Yes Gladstone Lighter, MD  metoprolol succinate (TOPROL-XL) 25 MG 24 hr tablet Take 1 tablet (25 mg total) by mouth daily. 05/24/17  Yes Jerrol Banana., MD   nitroGLYCERIN (NITROSTAT) 0.4 MG SL tablet Place 1 tablet (0.4 mg total) under the tongue every 5 (five) minutes as needed for chest pain. 07/17/16  Yes Mody, Ulice Bold, MD  potassium chloride SA (K-DUR,KLOR-CON) 20 MEQ tablet Take 1 tablet (20 mEq total) by mouth 2 (two) times daily as needed. 03/09/18  Yes Minna Merritts, MD  tamoxifen (NOLVADEX) 20 MG tablet Take 1 tablet (20 mg total) by mouth daily. 08/02/17  Yes Corcoran, Drue Second, MD  predniSONE (DELTASONE) 20 MG tablet Take 20 mg by mouth daily. 02/21/18   [provider]  sodium chloride 1 g tablet Take 1 g by mouth 2 (two) times daily with a meal.     [provider]    Allergies as of 04/06/2018 - Review Complete 03/08/2018  Allergen Reaction Noted  . Pimenta Nausea And Vomiting 11/04/2015  . Tomato  06/22/2017    Family History  Problem Relation Age of Onset  . Heart failure Mother   . Epilepsy Mother   . COPD Father   . Heart disease Father   . Anxiety disorder Sister   . Arthritis Brother   . Heart disease Brother   . Vaginal cancer Paternal Grandmother   . Heart attack Paternal Grandfather   . COPD Brother   . Kidney failure Brother   . COPD Brother   . Arthritis Sister   . Uterine cancer Other   . Diabetes Other   . Colon cancer Neg Hx   . Stomach cancer Neg Hx   . Breast cancer Neg Hx     Social History   Socioeconomic History  . Marital status: Married    Spouse name: Not on file  . Number of children: 2  . Years of education: Not on file  . Highest education level: Not on file  Occupational History  . Not on file  Social Needs  . Financial resource strain: Not on file  . Food insecurity:    Worry: Not on file    Inability: Not on file  . Transportation needs:    Medical: Not on file    Non-medical: Not on file  Tobacco Use  . Smoking status: Former Smoker    Packs/day: 0.75    Years: 40.00    Pack years: 30.00    Types: Cigarettes    Last attempt to quit: 11/07/2003     Years since quitting: 14.5  . Smokeless tobacco: Never Used  Substance and Sexual Activity  . Alcohol use: Yes    Alcohol/week: 1.0 - 3.0 standard drinks    Types: 1 - 3 Glasses of wine per week    Comment: Occasionally.    . Drug use: No  . Sexual activity: Not on file  Lifestyle  . Physical activity:    Days per week: Not on file    Minutes per session: Not on file  . Stress: Not on file  Relationships  . Social connections:    Talks on phone: Not on file    Gets together:  Not on file    Attends religious service: Not on file    Active member of club or organization: Not on file    Attends meetings of clubs or organizations: Not on file    Relationship status: Not on file  . Intimate partner violence:    Fear of current or ex partner: Not on file    Emotionally abused: Not on file    Physically abused: Not on file    Forced sexual activity: Not on file  Other Topics Concern  . Not on file  Social History Narrative   She drinks 1 to 2 caffeinated beverages a day    Review of Systems: See HPI, otherwise negative ROS  Physical Exam: BP (!) 164/67   Pulse (!) 57   Temp (!) 97.3 F (36.3 C) (Temporal)   Resp 16   Ht 5\' 5"  (1.651 m)   Wt 58.1 kg   SpO2 100%   BMI 21.30 kg/m  General:   Alert,  pleasant and cooperative in NAD Head:  Normocephalic and atraumatic. Neck:  Supple; no masses or thyromegaly. Lungs:  Clear throughout to auscultation, normal respiratory effort.    Heart:  +S1, +S2, Regular rate and rhythm, No edema. Abdomen:  Soft, nontender and nondistended. Normal bowel sounds, without guarding, and without rebound.   Neurologic:  Alert and  oriented x4;  grossly normal neurologically.  Impression/Plan: Bianca Shaw is here for an EGD for histologic changes seen on last EGD and history of gastric ulcers.   Risks, benefits, limitations, and alternatives regarding the procedure have been reviewed with the patient.  Questions have been answered.  All  parties agreeable.   Virgel Manifold, MD  05/10/2018, 8:50 AM

## 2018-05-10 NOTE — Transfer of Care (Signed)
Immediate Anesthesia Transfer of Care Note  Patient: Bianca Shaw  Procedure(s) Performed: ESOPHAGOGASTRODUODENOSCOPY (EGD) WITH PROPOFOL (N/A Throat)  Patient Location: PACU  Anesthesia Type: General  Level of Consciousness: awake, alert  and patient cooperative  Airway and Oxygen Therapy: Patient Spontanous Breathing and Patient connected to supplemental oxygen  Post-op Assessment: Post-op Vital signs reviewed, Patient's Cardiovascular Status Stable, Respiratory Function Stable, Patent Airway and No signs of Nausea or vomiting  Post-op Vital Signs: Reviewed and stable  Complications: No apparent anesthesia complications

## 2018-05-10 NOTE — Anesthesia Procedure Notes (Signed)
Date/Time: 05/10/2018 9:02 AM Performed by: Cameron Ali, CRNA Pre-anesthesia Checklist: Patient identified, Emergency Drugs available, Suction available, Timeout performed and Patient being monitored Patient Re-evaluated:Patient Re-evaluated prior to induction Oxygen Delivery Method: Nasal cannula Placement Confirmation: positive ETCO2

## 2018-05-10 NOTE — Anesthesia Postprocedure Evaluation (Signed)
Anesthesia Post Note  Patient: Bianca Shaw  Procedure(s) Performed: ESOPHAGOGASTRODUODENOSCOPY (EGD) WITH BIOPSIES (N/A Throat)  Patient location during evaluation: PACU Anesthesia Type: General Level of consciousness: awake and alert Pain management: pain level controlled Vital Signs Assessment: post-procedure vital signs reviewed and stable Cardiovascular status: blood pressure returned to baseline Anesthetic complications: no    Jaci Standard, III,  Irelyn Perfecto D

## 2018-05-11 ENCOUNTER — Encounter: Payer: Self-pay | Admitting: Gastroenterology

## 2018-05-13 LAB — SURGICAL PATHOLOGY

## 2018-05-17 ENCOUNTER — Inpatient Hospital Stay: Payer: PPO

## 2018-05-17 ENCOUNTER — Other Ambulatory Visit: Payer: Self-pay

## 2018-05-17 VITALS — BP 159/66 | HR 54

## 2018-05-17 DIAGNOSIS — N184 Chronic kidney disease, stage 4 (severe): Secondary | ICD-10-CM | POA: Diagnosis not present

## 2018-05-17 DIAGNOSIS — N189 Chronic kidney disease, unspecified: Secondary | ICD-10-CM

## 2018-05-17 DIAGNOSIS — C50412 Malignant neoplasm of upper-outer quadrant of left female breast: Secondary | ICD-10-CM

## 2018-05-17 DIAGNOSIS — D631 Anemia in chronic kidney disease: Principal | ICD-10-CM

## 2018-05-17 LAB — CBC WITH DIFFERENTIAL/PLATELET
Abs Immature Granulocytes: 0 10*3/uL (ref 0.00–0.07)
Basophils Absolute: 0.1 10*3/uL (ref 0.0–0.1)
Basophils Relative: 2 %
Eosinophils Absolute: 0.3 10*3/uL (ref 0.0–0.5)
Eosinophils Relative: 7 %
HCT: 29.6 % — ABNORMAL LOW (ref 36.0–46.0)
Hemoglobin: 9.5 g/dL — ABNORMAL LOW (ref 12.0–15.0)
Immature Granulocytes: 0 %
Lymphocytes Relative: 20 %
Lymphs Abs: 0.8 10*3/uL (ref 0.7–4.0)
MCH: 30.7 pg (ref 26.0–34.0)
MCHC: 32.1 g/dL (ref 30.0–36.0)
MCV: 95.8 fL (ref 80.0–100.0)
Monocytes Absolute: 0.7 10*3/uL (ref 0.1–1.0)
Monocytes Relative: 17 %
Neutro Abs: 2.3 10*3/uL (ref 1.7–7.7)
Neutrophils Relative %: 54 %
Platelets: 238 10*3/uL (ref 150–400)
RBC: 3.09 MIL/uL — ABNORMAL LOW (ref 3.87–5.11)
RDW: 16 % — ABNORMAL HIGH (ref 11.5–15.5)
WBC: 4.2 10*3/uL (ref 4.0–10.5)
nRBC: 0 % (ref 0.0–0.2)

## 2018-05-17 MED ORDER — EPOETIN ALFA 10000 UNIT/ML IJ SOLN
10000.0000 [IU] | Freq: Once | INTRAMUSCULAR | Status: AC
  Administered 2018-05-17: 10000 [IU] via SUBCUTANEOUS

## 2018-05-18 DIAGNOSIS — E871 Hypo-osmolality and hyponatremia: Secondary | ICD-10-CM | POA: Diagnosis not present

## 2018-05-21 ENCOUNTER — Telehealth: Payer: Self-pay | Admitting: *Deleted

## 2018-05-21 ENCOUNTER — Telehealth: Payer: Self-pay

## 2018-05-21 DIAGNOSIS — Z122 Encounter for screening for malignant neoplasm of respiratory organs: Secondary | ICD-10-CM

## 2018-05-21 NOTE — Telephone Encounter (Signed)
Patient has been notified that the annual lung cancer screening low dose CT scan is due currently or will be in the near future.  Confirmed that the patient is within the age range of 73-80, and asymptomatic, and currently exhibits no signs or symptoms of lung cancer.  Patient denies illness that would prevent curative treatment for lung cancer if found.  Verified smoking history, former smoker with 30 pkyear history.  The shared decision making visit was completed on 11-01-15.  Patient is agreeable for the CT scan to be scheduled.  Will call patient back with date and time of appointment.

## 2018-05-21 NOTE — Telephone Encounter (Signed)
Call pt regarding lung screening. Pt is a former smoker, would like scan to be mid morning any day will work. Pt denies any new health issues  at this time.

## 2018-05-22 ENCOUNTER — Other Ambulatory Visit: Payer: Self-pay | Admitting: Family Medicine

## 2018-05-22 DIAGNOSIS — I10 Essential (primary) hypertension: Secondary | ICD-10-CM

## 2018-05-24 ENCOUNTER — Telehealth: Payer: Self-pay | Admitting: *Deleted

## 2018-05-24 NOTE — Telephone Encounter (Signed)
Called pt to inform her or her appt for this Friday 05/27/2018 @ 10:15am here @ [facility], message left on home and cell phone.

## 2018-05-27 ENCOUNTER — Encounter: Payer: Self-pay | Admitting: *Deleted

## 2018-05-27 ENCOUNTER — Ambulatory Visit
Admission: RE | Admit: 2018-05-27 | Discharge: 2018-05-27 | Disposition: A | Discharge status: Home / Self Care | Payer: PPO | Source: Ambulatory Visit | Attending: Oncology | Admitting: Oncology

## 2018-05-27 DIAGNOSIS — Z122 Encounter for screening for malignant neoplasm of respiratory organs: Secondary | ICD-10-CM | POA: Diagnosis not present

## 2018-05-27 DIAGNOSIS — Z87891 Personal history of nicotine dependence: Secondary | ICD-10-CM | POA: Diagnosis not present

## 2018-05-31 ENCOUNTER — Inpatient Hospital Stay: Payer: PPO

## 2018-05-31 ENCOUNTER — Other Ambulatory Visit: Payer: Self-pay | Admitting: Cardiovascular Disease

## 2018-05-31 ENCOUNTER — Inpatient Hospital Stay: Payer: PPO | Attending: Hematology and Oncology

## 2018-05-31 DIAGNOSIS — Z79899 Other long term (current) drug therapy: Secondary | ICD-10-CM | POA: Insufficient documentation

## 2018-05-31 DIAGNOSIS — C50912 Malignant neoplasm of unspecified site of left female breast: Secondary | ICD-10-CM | POA: Diagnosis not present

## 2018-05-31 DIAGNOSIS — M810 Age-related osteoporosis without current pathological fracture: Secondary | ICD-10-CM | POA: Insufficient documentation

## 2018-05-31 DIAGNOSIS — Z87891 Personal history of nicotine dependence: Secondary | ICD-10-CM | POA: Insufficient documentation

## 2018-05-31 DIAGNOSIS — Z170 Estrogen receptor positive status [ER+]: Secondary | ICD-10-CM | POA: Insufficient documentation

## 2018-05-31 DIAGNOSIS — C50412 Malignant neoplasm of upper-outer quadrant of left female breast: Secondary | ICD-10-CM

## 2018-05-31 DIAGNOSIS — H35033 Hypertensive retinopathy, bilateral: Secondary | ICD-10-CM | POA: Diagnosis not present

## 2018-05-31 DIAGNOSIS — H2512 Age-related nuclear cataract, left eye: Secondary | ICD-10-CM | POA: Diagnosis not present

## 2018-05-31 DIAGNOSIS — Z79810 Long term (current) use of selective estrogen receptor modulators (SERMs): Secondary | ICD-10-CM | POA: Insufficient documentation

## 2018-05-31 DIAGNOSIS — N184 Chronic kidney disease, stage 4 (severe): Secondary | ICD-10-CM | POA: Insufficient documentation

## 2018-05-31 DIAGNOSIS — D631 Anemia in chronic kidney disease: Secondary | ICD-10-CM | POA: Diagnosis not present

## 2018-05-31 DIAGNOSIS — E876 Hypokalemia: Secondary | ICD-10-CM | POA: Insufficient documentation

## 2018-05-31 DIAGNOSIS — E538 Deficiency of other specified B group vitamins: Secondary | ICD-10-CM | POA: Insufficient documentation

## 2018-05-31 DIAGNOSIS — Z9012 Acquired absence of left breast and nipple: Secondary | ICD-10-CM | POA: Insufficient documentation

## 2018-05-31 LAB — CBC WITH DIFFERENTIAL/PLATELET
Abs Immature Granulocytes: 0 10*3/uL (ref 0.00–0.07)
Basophils Absolute: 0.1 10*3/uL (ref 0.0–0.1)
Basophils Relative: 2 %
Eosinophils Absolute: 0.2 10*3/uL (ref 0.0–0.5)
Eosinophils Relative: 4 %
HCT: 31.5 % — ABNORMAL LOW (ref 36.0–46.0)
Hemoglobin: 10.5 g/dL — ABNORMAL LOW (ref 12.0–15.0)
Immature Granulocytes: 0 %
Lymphocytes Relative: 20 %
Lymphs Abs: 0.9 10*3/uL (ref 0.7–4.0)
MCH: 31.7 pg (ref 26.0–34.0)
MCHC: 33.3 g/dL (ref 30.0–36.0)
MCV: 95.2 fL (ref 80.0–100.0)
Monocytes Absolute: 0.7 10*3/uL (ref 0.1–1.0)
Monocytes Relative: 15 %
Neutro Abs: 2.9 10*3/uL (ref 1.7–7.7)
Neutrophils Relative %: 59 %
Platelets: 255 10*3/uL (ref 150–400)
RBC: 3.31 MIL/uL — ABNORMAL LOW (ref 3.87–5.11)
RDW: 14.8 % (ref 11.5–15.5)
WBC: 4.8 10*3/uL (ref 4.0–10.5)
nRBC: 0 % (ref 0.0–0.2)

## 2018-05-31 NOTE — Telephone Encounter (Signed)
Per snapshot in patients chart  "Take 1 tablet (20 mEq total) by mouth 2 (two) times daily as needed"  Pharmacy is requesting  1 tablet by mouth as needed.   Please advise how patient should be taking.

## 2018-05-31 NOTE — Telephone Encounter (Signed)
Clarified with patient's husband, ok per DPR, that patient is taking potassium 1 tablet once a day. Refill sent to pharmacy.

## 2018-06-07 ENCOUNTER — Other Ambulatory Visit: Payer: Self-pay | Admitting: Hematology and Oncology

## 2018-06-07 ENCOUNTER — Ambulatory Visit: Payer: PPO | Admitting: Hematology and Oncology

## 2018-06-07 ENCOUNTER — Ambulatory Visit: Payer: PPO

## 2018-06-07 ENCOUNTER — Other Ambulatory Visit: Payer: PPO

## 2018-06-07 DIAGNOSIS — E538 Deficiency of other specified B group vitamins: Secondary | ICD-10-CM

## 2018-06-07 DIAGNOSIS — R7989 Other specified abnormal findings of blood chemistry: Secondary | ICD-10-CM

## 2018-06-07 NOTE — Progress Notes (Deleted)
Bianca Shaw is a 73 y.o. female with stage IIA left breast cancer, anemia of chronic renal disease, and electrolytes abnormalities (hyponatremia and hypercalcemia) who is seen for 3 month assessment on tamoxifen.   HPI:  The patient was last seen in the medical oncology clinic on 03/08/2018.  At that time, her energy level was good.  Exam was stable.  Hemoglobin was 10.9.  Sodium was 131.  Potassium was 3.3.  Calcium was 9.4.  CA27.29 was normal.  Ferritin was 47.  Folate was 9.0.  B12 was 212.  She continued tamoxifen.  She was contacted regarding her low B12.  Oral B12 was recommended on 03/09/2018.  She underwent repeat EGD on 05/10/2018 by Dr Bonna Gains revealed a Z-line irregular. There was erythematous mucosa in the antrum. There was a normal duodenal bulb, second portion of the duodenum and examined duodenum. Additional biopsies were obtained on the greater curvature of the gastric body, on the lesser curvature of the gastric body, at the incisura, on the greater curvature of the gastric antrum and on the lesser curvature of the gastric antrum.  Gastric mapping biopsies did not show any dysplasia.  Repeat EGD with biopsies was recommended in 2 years to follow these changes over time.   She has received Procrit (last 05/17/2018).  During the interim,   Past Medical History:  Diagnosis Date  . Anemia   . Anxiety disorder   . Breast cancer of upper-outer quadrant of left female breast ([facility]) 11/2015   pT2 pN0(i+).;ER+; PR +, her 2 neu not overexpressed.  Mastectomy, SLN, Mammoprint: Low risk.   . Cancer ([facility]) 12/03/2015   left breast/ INVASIVE LOBULAR CARCINOMA.   . Chronic kidney disease   . Cough    lingering, mild, finished Prednisone and anitbiotic 11/03/15  . Family history of adverse reaction to anesthesia    sister - PONV  . GERD (gastroesophageal reflux disease)   . Hypertension    . Osteopenia   . Osteoporosis   . Pericarditis    diagnonsed June, 2010, unclear etiology as of yer  . Personal history of tobacco use, presenting hazards to health 10/31/2015  . Scleroderma ([facility])    ONLY ON SKIN-MILD  . UTI (lower urinary tract infection)   . Wears dentures    full upper    Past Surgical History:  Procedure Laterality Date  . BREAST BIOPSY Right 2012   core - neg  . BREAST BIOPSY Left 12/03/2015   INVASIVE LOBULAR CARCINOMA.   Marland Kitchen CARDIAC CATHETERIZATION    . CATARACT EXTRACTION W/ INTRAOCULAR LENS IMPLANT Right   . COLONOSCOPY WITH PROPOFOL N/A 11/08/2015   Procedure: COLONOSCOPY WITH PROPOFOL;  Surgeon: Lucilla Lame, MD;  Location: [facility];  Service: Endoscopy;  Laterality: N/A;  . ESOPHAGOGASTRODUODENOSCOPY (EGD) WITH PROPOFOL N/A 11/18/2017   Procedure: ESOPHAGOGASTRODUODENOSCOPY (EGD) WITH PROPOFOL;  Surgeon: Virgel Manifold, MD;  Location: [facility];  Service: Endoscopy;  Laterality: N/A;  . ESOPHAGOGASTRODUODENOSCOPY (EGD) WITH PROPOFOL N/A 02/15/2018   Procedure: ESOPHAGOGASTRODUODENOSCOPY (EGD) WITH PROPOFOL;  Surgeon: Jonathon Bellows, MD;  Location: [facility] ENDOSCOPY;  Service: Gastroenterology;  Laterality: N/A;  . ESOPHAGOGASTRODUODENOSCOPY (EGD) WITH PROPOFOL N/A 05/10/2018   Procedure: ESOPHAGOGASTRODUODENOSCOPY (EGD) WITH BIOPSIES;  Surgeon: Virgel Manifold, MD;  Location: [facility];  Service: Endoscopy;  Laterality: N/A;  . EVACUATION BREAST HEMATOMA Left 01/14/2016   Procedure: EVACUATION HEMATOMA BREAST;  Surgeon: Robert Bellow,  MD;  Location: [facility];  Service: General;  Laterality: Left;  . EYE SURGERY    . MASTECTOMY Left 2017   complete mastectomy  . MASTECTOMY W/ SENTINEL NODE BIOPSY Left 12/26/2015   Procedure: MASTECTOMY WITH SENTINEL LYMPH NODE BIOPSY;  Surgeon: Robert Bellow, MD;  Location: [facility];  Service: General;  Laterality: Left;  . RIGHT/LEFT HEART CATH AND CORONARY ANGIOGRAPHY N/A 07/22/2016    Procedure: Right/Left Heart Cath and Coronary Angiography;  Surgeon: Minna Merritts, MD;  Location: [facility];  Service: Cardiovascular;  Laterality: N/A;  . TUBAL LIGATION    . VESICOVAGINAL FISTULA CLOSURE W/ TAH      Family History  Problem Relation Age of Onset  . Heart failure Mother   . Epilepsy Mother   . COPD Father   . Heart disease Father   . Anxiety disorder Sister   . Arthritis Brother   . Heart disease Brother   . Vaginal cancer Paternal Grandmother   . Heart attack Paternal Grandfather   . COPD Brother   . Kidney failure Brother   . COPD Brother   . Arthritis Sister   . Uterine cancer Other   . Diabetes Other   . Colon cancer Neg Hx   . Stomach cancer Neg Hx   . Breast cancer Neg Hx     Social History:  reports that she quit smoking about 14 years ago. Her smoking use included cigarettes. She has a 30.00 pack-year smoking history. She has never used smokeless tobacco. She reports current alcohol use of about 1.0 - 3.0 standard drinks of alcohol per week. She reports that she does not use drugs.  She has 5 brothers and 2 sisters.  She has 2 children who are alive and well.  She lives in [place] with her husband, Pat Patrick.  The patient is accompanied by her husband today.  Allergies:  Allergies  Allergen Reactions  . Pimenta Nausea And Vomiting  . Tomato Rash    Current Medications: Current Outpatient Medications  Medication Sig Dispense Refill  . albuterol (PROVENTIL) (2.5 MG/3ML) 0.083% nebulizer solution Take 3 mLs (2.5 mg total) by nebulization every 6 (six) hours as needed for wheezing or shortness of breath. 75 mL 12  . amLODipine (NORVASC) 2.5 MG tablet Take 1 tablet (2.5 mg total) by mouth daily. 30 tablet 0  . fluticasone (FLONASE) 50 MCG/ACT nasal spray Place 2 sprays into both nostrils daily. 16 g 0  . furosemide (LASIX) 20 MG tablet Take 20 mg by mouth daily.     . hydrALAZINE (APRESOLINE) 25 MG tablet Take 1 tablet (25 mg total) by mouth 2  (two) times daily. 60 tablet 11  . lisinopril (PRINIVIL,ZESTRIL) 20 MG tablet Take 1 tablet (20 mg total) by mouth daily. 30 tablet 2  . metoprolol succinate (TOPROL-XL) 25 MG 24 hr tablet Take 1 tablet (25 mg total) by mouth daily. 90 tablet 3  . nitroGLYCERIN (NITROSTAT) 0.4 MG SL tablet Place 1 tablet (0.4 mg total) under the tongue every 5 (five) minutes as needed for chest pain. 30 tablet 0  . potassium chloride SA (K-DUR,KLOR-CON) 20 MEQ tablet Take 1 tablet (20 mEq total) by mouth daily. 90 tablet 2  . predniSONE (DELTASONE) 20 MG tablet Take 20 mg by mouth daily.    . sodium chloride 1 g tablet Take 1 g by mouth 2 (two) times daily with a meal.     . tamoxifen (NOLVADEX) 20 MG tablet Take 1 tablet (20  mg total) by mouth daily. 90 tablet 0   No current facility-administered medications for this visit.     Review of Systems  Constitutional: Positive for weight loss (12 pounds). Negative for chills, diaphoresis, fever and malaise/fatigue.       Feels "good".  HENT: Negative for congestion, ear discharge, ear pain, nosebleeds, sinus pain, sore throat and tinnitus.   Eyes: Negative.  Negative for blurred vision, photophobia, pain, discharge and redness.  Respiratory: Negative.  Negative for cough, hemoptysis, sputum production and shortness of breath.   Cardiovascular: Negative for chest pain, palpitations, orthopnea, leg swelling and PND.  Gastrointestinal: Negative for abdominal pain, blood in stool, constipation, diarrhea, melena, nausea (resolved) and vomiting.       Duodenal ulcer.  Genitourinary: Negative.  Negative for dysuria, frequency, hematuria and urgency.  Musculoskeletal: Negative.  Negative for back pain, falls, joint pain, myalgias and neck pain.       Osteoporosis.  Skin: Negative.  Negative for itching and rash.       Scleroderma.  Neurological: Negative.  Negative for dizziness, tremors, sensory change, speech change, focal weakness, weakness and headaches.   Endo/Heme/Allergies: Negative.  Does not bruise/bleed easily.  Psychiatric/Behavioral: Positive for memory loss. Negative for depression. The patient is not nervous/anxious and does not have insomnia.   All other systems reviewed and are negative.  Performance status (ECOG): 0 - Asymptomatic  Vital Signs There were no vitals taken for this visit.  Physical Exam  Constitutional: She is well-developed, well-nourished, and in no distress. No distress.  HENT:  Head: Normocephalic.  Mouth/Throat: Oropharynx is clear and moist. No oropharyngeal exudate.  Short brown hair.  Eyes: Conjunctivae and EOM are normal. No scleral icterus.  Blue eyes.  Neck: Neck supple. No JVD present.  Cardiovascular: Normal rate, regular rhythm and normal heart sounds. Exam reveals no gallop and no friction rub.  No murmur heard. Pulmonary/Chest: Effort normal and breath sounds normal. No respiratory distress. She has no wheezes. She has no rales. Right breast exhibits skin change (fibrocystic changes upper outer quadrant) and tenderness. Right breast exhibits no inverted nipple, no mass and no nipple discharge. Breasts are asymmetrical (LEFT mestectomy; no erythema, nodularity or skin changes).  Abdominal: Soft. Bowel sounds are normal. She exhibits no distension and no mass. There is no abdominal tenderness. There is no rebound and no guarding.  Musculoskeletal: Normal range of motion.        General: No tenderness or edema.  Lymphadenopathy:    She has no cervical adenopathy.    She has no axillary adenopathy.       Right: No supraclavicular adenopathy present.       Left: No supraclavicular adenopathy present.  Neurological: She is alert. Gait normal.  Skin: Skin is warm. She is not diaphoretic. No erythema. No pallor.  Psychiatric: Mood, affect and judgment normal.  Nursing note and vitals reviewed.   Imaging studies: 11/21/2015:  Left mammogram and ultrasound revealed a 4.2 x 2.4 x 2.6 cm irregular  hypoechoic mass in the 1-2 o'clock position in the left breast 3 cm from the nipple.  Left axillary lymph nodes appeared normal.   12/25/2015:  Breast MRI revealed a 6.2 cm area of abnormal enhancement in the upper outer quadrant of the left breast.  The right breast revealed no mass or abnormal enhancement. 12/08/2016:  Right mammogram revealed no evidence of malignancy. 06/04/2017:  Chest CT revealed no pneumonia, edema, collapse or effusion.  There was a right middle lobe calcified granuloma.  There was a stable 8 mm scar posterior aspect of the right upper lobe. 06/11/2017:  Abdomen and pelvic CT revealed no acute process in the abdomen or pelvis.  There was possible bladder wall thickening (? under distention).  There was nonspecific presacral and posterior pelvic edema.  There was central uterine hypoattenuation (subtle). 06/16/2017:  Head MRI without contrast revealed no acute intracranial abnormality.  There was wide spread changes c/w advanced small vessel disease.  06/22/2017:  Bone survey revealed no focal lytic or sclerotic lesion. 08/23/2017: RIGHT diagnostic mammogram and breast ultrasound on 08/23/2017 revealed no mammographic or sonographic evidence of malignancy in the RIGHT breast. Patient has heterogeneously dense breast tissue (ACR  breast density category C).    No visits with results within 3 Day(s) from this visit.  Latest known visit with results is:  Appointment on 05/31/2018  Component Date Value Ref Range Status  . WBC 05/31/2018 4.8  4.0 - 10.5 K/uL Final  . RBC 05/31/2018 3.31* 3.87 - 5.11 MIL/uL Final  . Hemoglobin 05/31/2018 10.5* 12.0 - 15.0 g/dL Final  . HCT 05/31/2018 31.5* 36.0 - 46.0 % Final  . MCV 05/31/2018 95.2  80.0 - 100.0 fL Final  . MCH 05/31/2018 31.7  26.0 - 34.0 pg Final  . MCHC 05/31/2018 33.3  30.0 - 36.0 g/dL Final  . RDW 05/31/2018 14.8  11.5 - 15.5 % Final  . Platelets 05/31/2018 255  150 - 400 K/uL Final  . nRBC 05/31/2018 0.0  0.0 - 0.2 %  Final  . Neutrophils Relative % 05/31/2018 59  % Final  . Neutro Abs 05/31/2018 2.9  1.7 - 7.7 K/uL Final  . Lymphocytes Relative 05/31/2018 20  % Final  . Lymphs Abs 05/31/2018 0.9  0.7 - 4.0 K/uL Final  . Monocytes Relative 05/31/2018 15  % Final  . Monocytes Absolute 05/31/2018 0.7  0.1 - 1.0 K/uL Final  . Eosinophils Relative 05/31/2018 4  % Final  . Eosinophils Absolute 05/31/2018 0.2  0.0 - 0.5 K/uL Final  . Basophils Relative 05/31/2018 2  % Final  . Basophils Absolute 05/31/2018 0.1  0.0 - 0.1 K/uL Final  . Immature Granulocytes 05/31/2018 0  % Final  . Abs Immature Granulocytes 05/31/2018 0.00  0.00 - 0.07 K/uL Final   Performed at [facility], [address], [place], [place] [address]    Assessment:  Bianca Shaw is a 73 y.o. female with stage IIA (T2N0) left breast cancer s/p mastectomy with sentinel lymph node biopsy on 12/26/2015.   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  Margins were negative.  Two sentinel lymph nodes were negative for macrometastasis, but with isolated tumor cells on IHC stains.  Three additional lymph nodes were positive for isolated tumor cells on IHC.  Tumor was ER positive (> 90%), PR positive (> 90%), and Her2/neu 2+ (eqivocal).  Her2/neu by FISH was negative.  Pathologic stage was pT2 pN0(i+).  MammaPrint testing revealed low risk luminal type A. There was a 97.8% probability of being disease free at 10 years with hormonal therapy.   Right mammogram on 12/08/2016 revealed no evidence of malignancy. RIGHT diagnostic mammogram and breast ultrasound on 08/23/2017 revealed no mammographic or sonographic evidence of malignancy in the RIGHT breast. Patient has heterogeneously dense breast tissue (ACR  breast density category C).   Exam in 11/2016 revealed a 4 mm nodule in the left axillae above the mastectomy incision.  Excision biopsy on 12/15/2016 revealed fat  necrosis and no  malignancy.  Imaging studies (chest, abdomen, and pelvic CT, head MRI, and bone survey) in 05/2017 revealed no evidence of metastatic disease.  She began tamoxifen on 02/04/2016.  She is tolerating it well.  CA27.29 has been followed: 27.3 on 12/10/2015, 19.4 on 05/30/2016, 16.4 on 09/07/2016, 17.2 on 12/08/2016, 21.3 on 04/09/2017, 17.6 on 08/12/2017, and 18.8 on 12/14/2017.  Bone density on 11/05/2015 revealed osteoporosis with a T score of -2.9 in the AP spine L1-L2 and -1.9 in the left femoral neck.  Bone density on 11/08/2017 revealed osteopenia with a T-score of -2.0  in the AP spine and -1.8 in the LEFT femoral neck. She started Fosamax in 10/2015.  She is on calcium and vitamin D.  She had a colonoscopy in 09/2015.  She denies any melena, hematochezia, hematuria or vaginal bleeding.  She began oral B12 in early 03/2017.  B12 was 295 on 06/09/2016, 299 on 12/08/2016, and 1585 on 05/24/2017.  Folate was 11.5 on 04/09/2017.  EGD on 11/18/2017 revealed 3 to 4 non-bleeding ulcers, with the largest measuring 8 mm. There was no centralized bleeding, however there was mild oozing noted at the ulcer edges. Pathology revealed active gastritis with no evidence of high grade dysplasia or malignancy. IHC testing (+) for H. pylori.   EGD on 02/15/2018 that demonstrated a single non-bleeding duodenal ulcer measuring 6 mm in largest dimension.  There were mucosal changes noted in the gastric antrum.  Pathology revealed chronic active gastritis.  There was villous atrophy with marked active inflammation noted in the specimen taken from the duodenal bulb.  Nodular mucosa of the gastric body revealed one tiny fragment with intestinal metaplasia.  EGD on 05/10/2018 by revealed a Z-line irregular. There was erythematous mucosa in the antrum. There was a normal duodenal bulb, second portion of the duodenum and examined duodenum. Additional biopsies were obtained on the greater curvature of the gastric body, on the  lesser curvature of the gastric body, at the incisura, on the greater curvature of the gastric antrum and on the lesser curvature of the gastric antrum.  Gastric mapping biopsies did not show any dysplasia.  EGD with biopsies was recommended in 2 years to follow these changes over time.   She has a normocytic anemia.  She has anemia of chronic renal disease, stage IV.  Work-up on 12/08/2016 and 04/09/2017 revealed a low normal B12 with normal MMA thus r/o B12 deficiency.  SPEP revealed no monoclonal protein.  Free light chain ratio was 2.43 (0.26-1.65), unclear significance.  Ferritin, iron saturation, and folate were normal.  TSH is normal.  Retic was 1.2% (inappropriately low) on 09/07/2016 and 1.1% on 06/23/2017.  Coombs was negative on 10/19/2016 and 06/24/2017.  Creatinine was 1.41 (CrCl 36 ml/min) on 06/29/2017 and 1.94 (CrCl 19.5 ml/min) on 02/23/2018. She receives Procrit (last 05/17/2018).   24 hour urine (IFE) on 12/28/2016 revealed no monoclonal protein. 24 hour urine on 06/23/2017 revealed kappa free light chains 157 (1.35-24.19), lambda free light chains 6.99 (0.24-6.66), and free light chain ratio 22.46 (2.04-10.37).  Kappa free light chains were 159.9 (ratio 2.79) on 06/23/2017.  SPEP was normal on 06/23/2017.  Bone marrow aspirate and biopsy on 07/07/2017 revealed a variably cellular bone marrow with trilineage hematopoiesis.  There was slight plasmacytosis (4%).  Plasma cells were polyclonal for kappa and lambda light chains.  There were several lymphoid aggregates.  Congo stain for amyloid was negative.  Flow cytometry revealed no monoclonal B-cell population or abnormal T-cell population.  Cytogenetics were normal (55, XX).  She has a history of chronic hyponatremia (dating back to 07/2012).  She is followed by nephrology.  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  She is on fluid restriction. Cortisol and uric acid were normal on 06/24/2017.   She has scleroderma.  She has skin  thickening at sites of abrasion.  She has hypercalcemia felt related to thiazide diuretic and excess calcium intake.  She was seen by endocrinology.  PTH was 14 (low) and not c/w primary hyperparathyroidism.  TSH was normal.  PTH-rp on 06/23/2017 was < 2.0.  ACE level was 47 (normal) on 06/23/2017.  Calcitriol (vitamin D 1, 25 dihydroxy) was 75.3 (19.9-79.3).  Vitamin D, 25 hydroxy was 68.6 (30-100) on 06/22/2017.  She denies excess vitamin A and vitamin D intake.    Symptomatically,   Plan: 1. Labs today:  CBC with diff, CMP, CA27.29, B12, intrinsic factor antibody, anti-parietal antibody. 2.  3. . 4. Breast cancer - treatment ongoing Clinically doing well. Continue tamoxifen.  Next mammogram 08/24/2018. 5. Anemia of chronic renal disease Hemoglobin 10.9 and hematocrit 33.5 No Procrit today.  Continue routine lab monitoring and Procrit as previously prescribed.  6. Osteopenia - improved T-score of -2.0 (previously -2.9) in the AP spine and -1.8 (previously -1.9) in the LEFT femoral neck.  Ten-year fracture probability by FRAX of 7.1% or greater for hip fracture or 18.5% or greater for major osteoporotic fracture. 7. SIADH Sodium 131 off salt tablets. Follow-up with Dr. Holley Raring as scheduled. 8. HYPERcalcemia  Review interval hospitalization for hypercalcemia. Calcium is 9.4 today. Etiology unclear but possibly related to milk-alkali syndrome or granulomatous disease/sarcoid.  Patient on steroids.  Follow-up with endocrinology. 9. Hypokalemia  Patient currently taking potassium chloride 20 meq a day.  Increase potassium to BID x 2-3 days. 10. Chronic active gastritis Review interval EGD. Appointment with GI today. 11. RTC every 2 weeks for labs (CBC) +/- Procrit.  10.   RTC in 3 months for MD assessment, labs (CBC with diff, CMP, CA27.29), and +/- Procrit.   Lequita Asal, MD  06/07/2018, 4:51 AM  I saw and evaluated the patient, participating in the key portions of the  service and reviewing pertinent diagnostic studies and records.  I reviewed the nurse practitioner's note and agree with the findings and the plan.  Multiple questions were asked by the patient and answered.   Nolon Stalls, MD 06/07/2018,4:51 AM

## 2018-06-09 ENCOUNTER — Other Ambulatory Visit: Payer: Self-pay | Admitting: Family Medicine

## 2018-06-09 DIAGNOSIS — Z1231 Encounter for screening mammogram for malignant neoplasm of breast: Secondary | ICD-10-CM

## 2018-06-13 DIAGNOSIS — N183 Chronic kidney disease, stage 3 (moderate): Secondary | ICD-10-CM | POA: Diagnosis not present

## 2018-06-13 DIAGNOSIS — I10 Essential (primary) hypertension: Secondary | ICD-10-CM | POA: Diagnosis not present

## 2018-06-13 DIAGNOSIS — E871 Hypo-osmolality and hyponatremia: Secondary | ICD-10-CM | POA: Diagnosis not present

## 2018-06-13 DIAGNOSIS — D631 Anemia in chronic kidney disease: Secondary | ICD-10-CM | POA: Diagnosis not present

## 2018-06-16 ENCOUNTER — Other Ambulatory Visit: Payer: Self-pay | Admitting: Family Medicine

## 2018-06-16 ENCOUNTER — Inpatient Hospital Stay (HOSPITAL_BASED_OUTPATIENT_CLINIC_OR_DEPARTMENT_OTHER): Payer: PPO | Admitting: Hematology and Oncology

## 2018-06-16 ENCOUNTER — Encounter: Payer: Self-pay | Admitting: Hematology and Oncology

## 2018-06-16 ENCOUNTER — Inpatient Hospital Stay: Payer: PPO

## 2018-06-16 VITALS — BP 187/73 | HR 53 | Temp 97.2°F | Resp 18 | Ht 61.0 in | Wt 131.7 lb

## 2018-06-16 DIAGNOSIS — D631 Anemia in chronic kidney disease: Secondary | ICD-10-CM

## 2018-06-16 DIAGNOSIS — Z79810 Long term (current) use of selective estrogen receptor modulators (SERMs): Secondary | ICD-10-CM

## 2018-06-16 DIAGNOSIS — N184 Chronic kidney disease, stage 4 (severe): Secondary | ICD-10-CM | POA: Diagnosis not present

## 2018-06-16 DIAGNOSIS — Z170 Estrogen receptor positive status [ER+]: Secondary | ICD-10-CM | POA: Diagnosis not present

## 2018-06-16 DIAGNOSIS — E538 Deficiency of other specified B group vitamins: Secondary | ICD-10-CM

## 2018-06-16 DIAGNOSIS — M8588 Other specified disorders of bone density and structure, other site: Secondary | ICD-10-CM

## 2018-06-16 DIAGNOSIS — M810 Age-related osteoporosis without current pathological fracture: Secondary | ICD-10-CM | POA: Diagnosis not present

## 2018-06-16 DIAGNOSIS — Z9012 Acquired absence of left breast and nipple: Secondary | ICD-10-CM | POA: Diagnosis not present

## 2018-06-16 DIAGNOSIS — Z79899 Other long term (current) drug therapy: Secondary | ICD-10-CM | POA: Diagnosis not present

## 2018-06-16 DIAGNOSIS — C50412 Malignant neoplasm of upper-outer quadrant of left female breast: Secondary | ICD-10-CM

## 2018-06-16 DIAGNOSIS — I10 Essential (primary) hypertension: Secondary | ICD-10-CM

## 2018-06-16 DIAGNOSIS — E876 Hypokalemia: Secondary | ICD-10-CM

## 2018-06-16 DIAGNOSIS — C50912 Malignant neoplasm of unspecified site of left female breast: Secondary | ICD-10-CM | POA: Diagnosis not present

## 2018-06-16 DIAGNOSIS — N189 Chronic kidney disease, unspecified: Secondary | ICD-10-CM

## 2018-06-16 LAB — CBC WITH DIFFERENTIAL/PLATELET
Abs Immature Granulocytes: 0.02 10*3/uL (ref 0.00–0.07)
Basophils Absolute: 0.1 10*3/uL (ref 0.0–0.1)
Basophils Relative: 1 %
Eosinophils Absolute: 0.2 10*3/uL (ref 0.0–0.5)
Eosinophils Relative: 5 %
HCT: 31.5 % — ABNORMAL LOW (ref 36.0–46.0)
Hemoglobin: 10.5 g/dL — ABNORMAL LOW (ref 12.0–15.0)
Immature Granulocytes: 0 %
Lymphocytes Relative: 19 %
Lymphs Abs: 0.9 10*3/uL (ref 0.7–4.0)
MCH: 32.1 pg (ref 26.0–34.0)
MCHC: 33.3 g/dL (ref 30.0–36.0)
MCV: 96.3 fL (ref 80.0–100.0)
Monocytes Absolute: 0.8 10*3/uL (ref 0.1–1.0)
Monocytes Relative: 16 %
Neutro Abs: 2.8 10*3/uL (ref 1.7–7.7)
Neutrophils Relative %: 59 %
Platelets: 223 10*3/uL (ref 150–400)
RBC: 3.27 MIL/uL — ABNORMAL LOW (ref 3.87–5.11)
RDW: 14.3 % (ref 11.5–15.5)
WBC: 4.8 10*3/uL (ref 4.0–10.5)
nRBC: 0 % (ref 0.0–0.2)

## 2018-06-16 LAB — COMPREHENSIVE METABOLIC PANEL
ALT: 11 U/L (ref 0–44)
AST: 21 U/L (ref 15–41)
Albumin: 4 g/dL (ref 3.5–5.0)
Alkaline Phosphatase: 58 U/L (ref 38–126)
Anion gap: 7 (ref 5–15)
BUN: 23 mg/dL (ref 8–23)
CO2: 25 mmol/L (ref 22–32)
Calcium: 9.1 mg/dL (ref 8.9–10.3)
Chloride: 99 mmol/L (ref 98–111)
Creatinine, Ser: 1.14 mg/dL — ABNORMAL HIGH (ref 0.44–1.00)
GFR calc Af Amer: 55 mL/min — ABNORMAL LOW (ref >60)
GFR calc non Af Amer: 48 mL/min — ABNORMAL LOW (ref >60)
Glucose, Bld: 83 mg/dL (ref 70–99)
Potassium: 4.4 mmol/L (ref 3.5–5.1)
Sodium: 131 mmol/L — ABNORMAL LOW (ref 135–145)
Total Bilirubin: 0.2 mg/dL — ABNORMAL LOW (ref 0.3–1.2)
Total Protein: 8 g/dL (ref 6.5–8.1)

## 2018-06-16 LAB — VITAMIN B12: Vitamin B-12: 354 pg/mL (ref 180–914)

## 2018-06-16 NOTE — Progress Notes (Signed)
[facility] day:  06/16/2018   Chief Complaint: Bianca Shaw is a 73 y.o. female with stage IIA left breast cancer, anemia of chronic renal disease, and electrolytes abnormalities (hyponatremia and hypercalcemia) who is seen for 3 month assessment on tamoxifen.   HPI:  The patient was last seen in the medical oncology clinic on 03/08/2018.  At that time, her energy level was good.  Exam was stable.  Hemoglobin was 10.9.  Sodium was 131.  Potassium was 3.3.  Calcium was 9.4.  CA27.29 was normal.  Ferritin was 47.  Folate was 9.0.  B12 was 212.  She continued tamoxifen.  She was contacted regarding her low B12.  Oral B12 was recommended on 03/09/2018.  She underwent repeat EGD on 05/10/2018 by Dr Bonna Gains revealed a Z-line irregular. There was erythematous mucosa in the antrum. There was a normal duodenal bulb, second portion of the duodenum and examined duodenum. Additional biopsies were obtained on the greater curvature of the gastric body, on the lesser curvature of the gastric body, at the incisura, on the greater curvature of the gastric antrum and on the lesser curvature of the gastric antrum.  Gastric mapping biopsies did not show any dysplasia.  Repeat EGD with biopsies was recommended in 2 years to follow these changes over time.   She has received Procrit (last 05/17/2018).  During the interim, she has felt "real good".  She denies any bleeding.  She states that she ran out of her B12 two days ago.  She denies any breast concerns.   Past Medical History:  Diagnosis Date  . Anemia   . Anxiety disorder   . Breast cancer of upper-outer quadrant of left female breast ([place]) 11/2015   pT2 pN0(i+).;ER+; PR +, her 2 neu not overexpressed.  Mastectomy, SLN, Mammoprint: Low risk.   . Cancer ([place]) 12/03/2015   left breast/ INVASIVE LOBULAR CARCINOMA.   . Chronic kidney disease   . Cough    lingering, mild, finished Prednisone and anitbiotic  11/03/15  . Family history of adverse reaction to anesthesia    sister - PONV  . GERD (gastroesophageal reflux disease)   . Hypertension   . Osteopenia   . Osteoporosis   . Pericarditis    diagnonsed June, 2010, unclear etiology as of yer  . Personal history of tobacco use, presenting hazards to health 10/31/2015  . Scleroderma ([facility])    ONLY ON SKIN-MILD  . UTI (lower urinary tract infection)   . Wears dentures    full upper    Past Surgical History:  Procedure Laterality Date  . BREAST BIOPSY Right 2012   core - neg  . BREAST BIOPSY Left 12/03/2015   INVASIVE LOBULAR CARCINOMA.   Marland Kitchen CARDIAC CATHETERIZATION    . CATARACT EXTRACTION W/ INTRAOCULAR LENS IMPLANT Right   . COLONOSCOPY WITH PROPOFOL N/A 11/08/2015   Procedure: COLONOSCOPY WITH PROPOFOL;  Surgeon: Lucilla Lame, MD;  Location: [facility];  Service: Endoscopy;  Laterality: N/A;  . ESOPHAGOGASTRODUODENOSCOPY (EGD) WITH PROPOFOL N/A 11/18/2017   Procedure: ESOPHAGOGASTRODUODENOSCOPY (EGD) WITH PROPOFOL;  Surgeon: Virgel Manifold, MD;  Location: [facility] ENDOSCOPY;  Service: Endoscopy;  Laterality: N/A;  . ESOPHAGOGASTRODUODENOSCOPY (EGD) WITH PROPOFOL N/A 02/15/2018   Procedure: ESOPHAGOGASTRODUODENOSCOPY (EGD) WITH PROPOFOL;  Surgeon: Jonathon Bellows, MD;  Location: [facility] ENDOSCOPY;  Service: Gastroenterology;  Laterality: N/A;  . ESOPHAGOGASTRODUODENOSCOPY (EGD) WITH PROPOFOL N/A 05/10/2018   Procedure: ESOPHAGOGASTRODUODENOSCOPY (EGD) WITH BIOPSIES;  Surgeon: Virgel Manifold, MD;  Location: [facility];  Service: Endoscopy;  Laterality: N/A;  . EVACUATION BREAST HEMATOMA Left 01/14/2016   Procedure: EVACUATION HEMATOMA BREAST;  Surgeon: Robert Bellow, MD;  Location: [facility] OR[facility];  Service: General;  Laterality: Left;  . EYE SURGERY    . MASTECTOMY Left 2017   complete mastectomy  . MASTECTOMY W/ SENTINEL NODE BIOPSY Left 12/26/2015   Procedure: MASTECTOMY WITH SENTINEL LYMPH NODE BIOPSY;  Surgeon: Robert Bellow, MD;  Location: [facility] OR[facility];  Service: General;  Laterality: Left;  . RIGHT/LEFT HEART CATH AND CORONARY ANGIOGRAPHY N/A 07/22/2016   Procedure: Right/Left Heart Cath and Coronary Angiography;  Surgeon: Minna Merritts, MD;  Location: [facility];  Service: Cardiovascular;  Laterality: N/A;  . TUBAL LIGATION    . VESICOVAGINAL FISTULA CLOSURE W/ TAH      Family History  Problem Relation Age of Onset  . Heart failure Mother   . Epilepsy Mother   . COPD Father   . Heart disease Father   . Anxiety disorder Sister   . Arthritis Brother   . Heart disease Brother   . Vaginal cancer Paternal Grandmother   . Heart attack Paternal Grandfather   . COPD Brother   . Kidney failure Brother   . COPD Brother   . Arthritis Sister   . Uterine cancer Other   . Diabetes Other   . Colon cancer Neg Hx   . Stomach cancer Neg Hx   . Breast cancer Neg Hx     Social History:  reports that she quit smoking about 14 years ago. Her smoking use included cigarettes. She has a 30.00 pack-year smoking history. She has never used smokeless tobacco. She reports current alcohol use of about 1.0 - 3.0 standard drinks of alcohol per week. She reports that she does not use drugs.  She has 5 brothers and 2 sisters.  She has 2 children who are alive and well.  She lives in [place] with her husband, Bianca Shaw.    Allergies:  Allergies  Allergen Reactions  . Pimenta Nausea And Vomiting  . Tomato Rash    Current Medications: Current Outpatient Medications  Medication Sig Dispense Refill  . amLODipine (NORVASC) 2.5 MG tablet Take 1 tablet (2.5 mg total) by mouth daily. 30 tablet 0  . furosemide (LASIX) 20 MG tablet Take 20 mg by mouth daily.     . hydrALAZINE (APRESOLINE) 25 MG tablet Take 1 tablet (25 mg total) by mouth 2 (two) times daily. 60 tablet 11  . lisinopril (PRINIVIL,ZESTRIL) 20 MG tablet Take 1 tablet (20 mg total) by mouth daily. 30 tablet 2  . metoprolol succinate (TOPROL-XL) 25 MG 24 hr tablet  Take 1 tablet (25 mg total) by mouth daily. 90 tablet 3  . potassium chloride SA (K-DUR,KLOR-CON) 20 MEQ tablet Take 1 tablet (20 mEq total) by mouth daily. (Patient taking differently: Take 20 mEq by mouth 2 (two) times daily. ) 90 tablet 2  . tamoxifen (NOLVADEX) 20 MG tablet Take 1 tablet (20 mg total) by mouth daily. 90 tablet 0  . albuterol (PROVENTIL) (2.5 MG/3ML) 0.083% nebulizer solution Take 3 mLs (2.5 mg total) by nebulization every 6 (six) hours as needed for wheezing or shortness of breath. 75 mL 12  . fluticasone (FLONASE) 50 MCG/ACT nasal spray Place 2 sprays into both nostrils daily. 16 g 0  . nitroGLYCERIN (NITROSTAT) 0.4 MG SL tablet Place 1 tablet (0.4 mg total) under the tongue every 5 (five) minutes as needed for  chest pain. 30 tablet 0  . ondansetron (ZOFRAN ODT) 4 MG disintegrating tablet Take 1 tablet (4 mg total) by mouth every 8 (eight) hours as needed. 20 tablet 1  . simvastatin (ZOCOR) 40 MG tablet Take 40 mg by mouth daily.     No current facility-administered medications for this visit.     Review of Systems  Constitutional: Negative.  Negative for chills, diaphoresis, fever, malaise/fatigue and weight loss (up 8 pounds).       Feels "real good".  HENT: Negative.  Negative for congestion, ear pain, nosebleeds, sinus pain, sore throat and tinnitus.   Eyes: Negative.  Negative for blurred vision, photophobia and pain.  Respiratory: Negative.  Negative for hemoptysis, sputum production and shortness of breath.   Cardiovascular: Negative.  Negative for chest pain, palpitations, orthopnea, leg swelling and PND.  Gastrointestinal: Negative for abdominal pain, blood in stool, constipation, diarrhea, heartburn, melena, nausea and vomiting.       History of duodenal ulcer.  Genitourinary: Negative.  Negative for dysuria, frequency, hematuria and urgency.  Musculoskeletal: Negative.  Negative for back pain, falls, joint pain, myalgias and neck pain.       Osteoporosis.   Skin: Negative.  Negative for itching and rash.       Scleroderma.  Neurological: Negative.  Negative for dizziness, tremors, sensory change, speech change, focal weakness, weakness and headaches.  Endo/Heme/Allergies: Negative.  Does not bruise/bleed easily.  Psychiatric/Behavioral: Positive for memory loss. Negative for depression. The patient is not nervous/anxious and does not have insomnia.   All other systems reviewed and are negative.  Performance status (ECOG): 0  Vital Signs BP (!) 187/73 (BP Location: Right Arm, Patient Position: Sitting)   Pulse (!) 53   Temp (!) 97.2 F (36.2 C) (Tympanic)   Resp 18   Ht '5\' 1"'$  (1.549 m)   Wt 131 lb 11.6 oz (59.8 kg)   SpO2 100%   BMI 24.89 kg/m   Physical Exam  Constitutional: She is well-developed, well-nourished, and in no distress. No distress.  HENT:  Head: Normocephalic.  Mouth/Throat: Oropharynx is clear and moist. No oropharyngeal exudate.  Short brown hair.  Eyes: Pupils are equal, round, and reactive to light. Conjunctivae and EOM are normal. No scleral icterus.  Blue eyes.  Neck: Normal range of motion. Neck supple. No JVD present.  Cardiovascular: Normal rate, regular rhythm and normal heart sounds. Exam reveals no gallop and no friction rub.  No murmur heard. Pulmonary/Chest: Effort normal and breath sounds normal. No respiratory distress. She has no wheezes. She has no rales. Right breast exhibits no inverted nipple, no mass, no nipple discharge and no skin change (fibrocystic changes upper quadrants). Breasts are asymmetrical (LEFT mastectomy; no erythema, nodularity or skin changes).  Abdominal: Soft. Bowel sounds are normal. She exhibits no distension and no mass. There is no abdominal tenderness. There is no rebound and no guarding.  Musculoskeletal: Normal range of motion.        General: No tenderness or edema.  Lymphadenopathy:    She has no cervical adenopathy.    She has no axillary adenopathy.       Right:  No supraclavicular adenopathy present.       Left: No supraclavicular adenopathy present.  Neurological: She is alert. Gait normal.  Skin: Skin is warm. She is not diaphoretic. No erythema. No pallor.  Psychiatric: Mood, affect and judgment normal.  Nursing note and vitals reviewed.   Imaging studies: 11/21/2015:  Left mammogram and ultrasound revealed a  4.2 x 2.4 x 2.6 cm irregular hypoechoic mass in the 1-2 o'clock position in the left breast 3 cm from the nipple.  Left axillary lymph nodes appeared normal.   12/25/2015:  Breast MRI revealed a 6.2 cm area of abnormal enhancement in the upper outer quadrant of the left breast.  The right breast revealed no mass or abnormal enhancement. 12/08/2016:  Right mammogram revealed no evidence of malignancy. 06/04/2017:  Chest CT revealed no pneumonia, edema, collapse or effusion.  There was a right middle lobe calcified granuloma. There was a stable 8 mm scar posterior aspect of the right upper lobe. 06/11/2017:  Abdomen and pelvic CT revealed no acute process in the abdomen or pelvis.  There was possible bladder wall thickening (? under distention).  There was nonspecific presacral and posterior pelvic edema.  There was central uterine hypoattenuation (subtle). 06/16/2017:  Head MRI without contrast revealed no acute intracranial abnormality.  There was wide spread changes c/w advanced small vessel disease.  06/22/2017:  Bone survey revealed no focal lytic or sclerotic lesion. 08/23/2017: RIGHT diagnostic mammogram and breast ultrasound on 08/23/2017 revealed no mammographic or sonographic evidence of malignancy in the RIGHT breast. Patient has heterogeneously dense breast tissue (ACR  breast density category C).    Appointment on 06/16/2018  Component Date Value Ref Range Status  . Parietal Cell Antibody-IgG 06/16/2018 1.9  0.0 - 20.0 Units Final   Comment: (NOTE)                                Negative    0.0 - 20.0                                 Equivocal  20.1 - 24.9                                Positive         >24.9 Parietal Cell Antibodies are found in 90% of patients with pernicious anemia and 30% of first degree relatives with pernicious anemia. Performed At: [facility], [place] [id number] Rush Farmer MD [id number]   . Intrinsic Factor 06/16/2018 1.0  0.0 - 1.1 AU/mL Final   Comment: (NOTE) Performed At: [facility] [place], [place] [id number] Rush Farmer MD [id number]   . Vitamin B-12 06/16/2018 354  180 - 914 pg/mL Final   Comment: (NOTE) This assay is not validated for testing neonatal or myeloproliferative syndrome specimens for Vitamin B12 levels. Performed at [facility], [place] [address], [place], [place] [address]   . CA 27.29 06/16/2018 17.2  0.0 - 38.6 U/mL Final   Comment: (NOTE) Siemens Centaur Immunochemiluminometric Methodology [facility]) Values obtained with different assay methods or kits cannot be used interchangeably. Results cannot be interpreted as absolute evidence of the presence or absence of malignant disease. Performed At: [facility] [place], [place] [id number] Rush Farmer MD [id number]   . Sodium 06/16/2018 131* 135 - 145 mmol/L Final  . Potassium 06/16/2018 4.4  3.5 - 5.1 mmol/L Final  . Chloride 06/16/2018 99  98 - 111 mmol/L Final  . CO2 06/16/2018 25  22 - 32 mmol/L Final  . Glucose, Bld 06/16/2018 83  70 - 99 mg/dL Final  . BUN 06/16/2018 23  8 - 23 mg/dL Final  . Creatinine, Ser 06/16/2018 1.14* 0.44 - 1.00 mg/dL Final  . Calcium 06/16/2018 9.1  8.9 - 10.3 mg/dL Final  . Total Protein 06/16/2018 8.0  6.5 - 8.1 g/dL Final  . Albumin 06/16/2018 4.0  3.5 - 5.0 g/dL Final  . AST 06/16/2018 21  15 - 41 U/L Final  . ALT 06/16/2018 11  0 - 44 U/L Final  . Alkaline Phosphatase 06/16/2018 58  38 - 126 U/L Final  . Total Bilirubin 06/16/2018 0.2* 0.3 - 1.2 mg/dL Final  . GFR calc non  Af Amer 06/16/2018 48* >60 mL/min Final  . GFR calc Af Amer 06/16/2018 55* >60 mL/min Final  . Anion gap 06/16/2018 7  5 - 15 Final   Performed at [facility], [address] [place], [place] [address]  . WBC 06/16/2018 4.8  4.0 - 10.5 K/uL Final  . RBC 06/16/2018 3.27* 3.87 - 5.11 MIL/uL Final  . Hemoglobin 06/16/2018 10.5* 12.0 - 15.0 g/dL Final  . HCT 06/16/2018 31.5* 36.0 - 46.0 % Final  . MCV 06/16/2018 96.3  80.0 - 100.0 fL Final  . MCH 06/16/2018 32.1  26.0 - 34.0 pg Final  . MCHC 06/16/2018 33.3  30.0 - 36.0 g/dL Final  . RDW 06/16/2018 14.3  11.5 - 15.5 % Final  . Platelets 06/16/2018 223  150 - 400 K/uL Final  . nRBC 06/16/2018 0.0  0.0 - 0.2 % Final  . Neutrophils Relative % 06/16/2018 59  % Final  . Neutro Abs 06/16/2018 2.8  1.7 - 7.7 K/uL Final  . Lymphocytes Relative 06/16/2018 19  % Final  . Lymphs Abs 06/16/2018 0.9  0.7 - 4.0 K/uL Final  . Monocytes Relative 06/16/2018 16  % Final  . Monocytes Absolute 06/16/2018 0.8  0.1 - 1.0 K/uL Final  . Eosinophils Relative 06/16/2018 5  % Final  . Eosinophils Absolute 06/16/2018 0.2  0.0 - 0.5 K/uL Final  . Basophils Relative 06/16/2018 1  % Final  . Basophils Absolute 06/16/2018 0.1  0.0 - 0.1 K/uL Final  . Immature Granulocytes 06/16/2018 0  % Final  . Abs Immature Granulocytes 06/16/2018 0.02  0.00 - 0.07 K/uL Final   Performed at [facility], [address], [place], [place] [address]    Assessment:  MEGA KINKADE is a 73 y.o. female with stage IIA (T2N0) left breast cancer s/p mastectomy with sentinel lymph node biopsy on 12/26/2015.   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  Margins were negative.  Two sentinel lymph nodes were negative for macrometastasis, but with isolated tumor cells on IHC stains.  Three additional lymph nodes were positive for isolated tumor cells on IHC.  Tumor was ER positive (> 90%), PR positive (> 90%), and  Her2/neu 2+ (eqivocal).  Her2/neu by FISH was negative.  Pathologic stage was pT2 pN0(i+).  MammaPrint testing revealed low risk luminal type A. There was a 97.8% probability of being disease free at 10 years with hormonal therapy.   Right mammogram on 12/08/2016 revealed no evidence of malignancy. RIGHT diagnostic mammogram and breast ultrasound on 08/23/2017 revealed no mammographic or sonographic evidence of malignancy in the RIGHT breast. Patient has heterogeneously dense breast tissue (ACR  breast density category C).   Exam in 11/2016 revealed a 4 mm nodule in the left axillae above the mastectomy incision.  Excision biopsy on 12/15/2016 revealed fat necrosis and no malignancy.  Imaging studies (  chest, abdomen, and pelvic CT, head MRI, and bone survey) in 05/2017 revealed no evidence of metastatic disease.  She began tamoxifen on 02/04/2016.  She is tolerating it well.  CA27.29 has been followed: 27.3 on 12/10/2015, 19.4 on 05/30/2016, 16.4 on 09/07/2016, 17.2 on 12/08/2016, 21.3 on 04/09/2017, 17.6 on 08/12/2017, 18.8 on 12/14/2017, and 17.2 on 06/16/2018.  Bone density on 11/05/2015 revealed osteoporosis with a T score of -2.9 in the AP spine L1-L2 and -1.9 in the left femoral neck.  Bone density on 11/08/2017 revealed osteopenia with a T-score of -2.0  in the AP spine and -1.8 in the LEFT femoral neck. She started Fosamax in 10/2015.  She is on calcium and vitamin D.  She had a colonoscopy in 09/2015.  She denies any melena, hematochezia, hematuria or vaginal bleeding.  She began oral B12 in early 03/2017.  B12 was 295 on 06/09/2016, 299 on 12/08/2016, and 1585 on 05/24/2017.  Folate was 11.5 on 04/09/2017.  EGD on 11/18/2017 revealed 3 to 4 non-bleeding ulcers, with the largest measuring 8 mm. There was no centralized bleeding, however there was mild oozing noted at the ulcer edges. Pathology revealed active gastritis with no evidence of high grade dysplasia or malignancy. IHC testing (+)  for H. pylori.   EGD on 02/15/2018 that demonstrated a single non-bleeding duodenal ulcer measuring 6 mm in largest dimension.  There were mucosal changes noted in the gastric antrum.  Pathology revealed chronic active gastritis.  There was villous atrophy with marked active inflammation noted in the specimen taken from the duodenal bulb.  Nodular mucosa of the gastric body revealed one tiny fragment with intestinal metaplasia.  EGD on 05/10/2018 by revealed a Z-line irregular. There was erythematous mucosa in the antrum. There was a normal duodenal bulb, second portion of the duodenum and examined duodenum. Additional biopsies were obtained on the greater curvature of the gastric body, on the lesser curvature of the gastric body, at the incisura, on the greater curvature of the gastric antrum and on the lesser curvature of the gastric antrum.  Gastric mapping biopsies did not show any dysplasia.  EGD with biopsies was recommended in 2 years to follow these changes over time.   She has a normocytic anemia.  She has anemia of chronic renal disease, stage IV.  Work-up on 12/08/2016 and 04/09/2017 revealed a low normal B12 with normal MMA thus r/o B12 deficiency.  SPEP revealed no monoclonal protein.  Free light chain ratio was 2.43 (0.26-1.65), unclear significance.  Ferritin, iron saturation, and folate were normal.  TSH is normal.  Retic was 1.2% (inappropriately low) on 09/07/2016 and 1.1% on 06/23/2017.  Coombs was negative on 10/19/2016 and 06/24/2017.  Creatinine was 1.41 (CrCl 36 ml/min) on 06/29/2017 and 1.94 (CrCl 19.5 ml/min) on 02/23/2018. She receives Procrit (last 05/17/2018).   24 hour urine (IFE) on 12/28/2016 revealed no monoclonal protein. 24 hour urine on 06/23/2017 revealed kappa free light chains 157 (1.35-24.19), lambda free light chains 6.99 (0.24-6.66), and free light chain ratio 22.46 (2.04-10.37).  Kappa free light chains were 159.9 (ratio 2.79) on 06/23/2017.  SPEP was normal on  06/23/2017.  Bone marrow aspirate and biopsy on 07/07/2017 revealed a variably cellular bone marrow with trilineage hematopoiesis.  There was slight plasmacytosis (4%).  Plasma cells were polyclonal for kappa and lambda light chains.  There were several lymphoid aggregates.  Congo stain for amyloid was negative.  Flow cytometry revealed no monoclonal B-cell population or abnormal T-cell population.  Cytogenetics were normal (46,  XX).  She has a history of chronic hyponatremia (dating back to 07/2012).  She is followed by nephrology.  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  She is on fluid restriction. Cortisol and uric acid were normal on 06/24/2017.   She has scleroderma.  She has skin thickening at sites of abrasion.  She has hypercalcemia felt related to thiazide diuretic and excess calcium intake.  She was seen by endocrinology.  PTH was 14 (low) and not c/w primary hyperparathyroidism.  TSH was normal.  PTH-rp on 06/23/2017 was < 2.0.  ACE level was 47 (normal) on 06/23/2017.  Calcitriol (vitamin D 1, 25 dihydroxy) was 75.3 (19.9-79.3).  Vitamin D, 25 hydroxy was 68.6 (30-100) on 06/22/2017.  She denies excess vitamin A and vitamin D intake.    Symptomatically, she feels "real good".    Exam is stable.  Plan: 1. Labs today:  CBC with diff, CMP, CA27.29, B12, intrinsic factor antibody, anti-parietal antibody. 2. Stage IIA left breast cancer Clinically she is doing well. No evidence of locally recurrent disease. CA27.29 is normal. Continue tamoxifen (began 02/04/2016). Mammogram due on 08/24/2018. 3. Anemia of chronic renal disease Hematocrit 31.5.  Hemoglobin 10.5. No Procrit today.  Continue lab check and Procrit if hemoglobin < 10. 4. Osteopenia T-score of -2.0 (previously -2.9) in the AP spine and -1.8 (previously -1.9) in the LEFT femoral neck.  Continue Fosamax. 5. SIADH Sodium is 131 (stable). Patient is followed by Dr Holley Raring. 6. HYPERcalcemia  Calcium is 9.1  (normal). Continue to monitor. 7. Chronic active gastritis No interval bleeding. Continue to monitor. 8.   Low B12 level  B12 was B12 was 295 on 06/09/2016.  B12 goal is 400.  Patient on oral B12.  Check B12, intrinsic factor antibody, anti-parietal antibody.  9.   RTC every 2 weeks for labs (CBC) and +/- Procrit. 10.   RTC in 4 months for MD assessment, labs (CBC with diff, CMP, CA27.29), review of mammogram, and +/- Procrit.  I discussed the assessment and treatment plan with the patient.  The patient was provided an opportunity to ask questions and all were answered.  The patient agreed with the plan and demonstrated an understanding of the instructions.  The patient was advised to call back or seek an in person evaluation if the symptoms worsen or if the condition fails to improve as anticipated.    Lequita Asal, MD  06/16/2018, 4:35 PM

## 2018-06-17 LAB — ANTI-PARIETAL ANTIBODY: Parietal Cell Antibody-IgG: 1.9 Units (ref 0.0–20.0)

## 2018-06-17 LAB — INTRINSIC FACTOR ANTIBODIES: Intrinsic Factor: 1 AU/mL (ref 0.0–1.1)

## 2018-06-17 LAB — CANCER ANTIGEN 27.29: CA 27.29: 17.2 U/mL (ref 0.0–38.6)

## 2018-06-24 DIAGNOSIS — H2512 Age-related nuclear cataract, left eye: Secondary | ICD-10-CM | POA: Diagnosis not present

## 2018-06-25 ENCOUNTER — Encounter: Payer: Self-pay | Admitting: Hematology and Oncology

## 2018-06-30 ENCOUNTER — Inpatient Hospital Stay: Payer: PPO | Attending: Urgent Care

## 2018-06-30 DIAGNOSIS — C50912 Malignant neoplasm of unspecified site of left female breast: Secondary | ICD-10-CM | POA: Diagnosis not present

## 2018-06-30 DIAGNOSIS — E538 Deficiency of other specified B group vitamins: Secondary | ICD-10-CM

## 2018-06-30 DIAGNOSIS — D631 Anemia in chronic kidney disease: Secondary | ICD-10-CM | POA: Insufficient documentation

## 2018-06-30 DIAGNOSIS — Z170 Estrogen receptor positive status [ER+]: Secondary | ICD-10-CM | POA: Diagnosis not present

## 2018-06-30 DIAGNOSIS — E876 Hypokalemia: Secondary | ICD-10-CM

## 2018-06-30 DIAGNOSIS — Z79899 Other long term (current) drug therapy: Secondary | ICD-10-CM | POA: Diagnosis not present

## 2018-06-30 DIAGNOSIS — N184 Chronic kidney disease, stage 4 (severe): Secondary | ICD-10-CM | POA: Insufficient documentation

## 2018-06-30 DIAGNOSIS — M810 Age-related osteoporosis without current pathological fracture: Secondary | ICD-10-CM | POA: Diagnosis not present

## 2018-06-30 DIAGNOSIS — M8588 Other specified disorders of bone density and structure, other site: Secondary | ICD-10-CM

## 2018-06-30 DIAGNOSIS — N189 Chronic kidney disease, unspecified: Secondary | ICD-10-CM

## 2018-06-30 DIAGNOSIS — C50412 Malignant neoplasm of upper-outer quadrant of left female breast: Secondary | ICD-10-CM

## 2018-06-30 LAB — CBC
HCT: 30.8 % — ABNORMAL LOW (ref 36.0–46.0)
Hemoglobin: 10.4 g/dL — ABNORMAL LOW (ref 12.0–15.0)
MCH: 32.5 pg (ref 26.0–34.0)
MCHC: 33.8 g/dL (ref 30.0–36.0)
MCV: 96.3 fL (ref 80.0–100.0)
Platelets: 211 10*3/uL (ref 150–400)
RBC: 3.2 MIL/uL — ABNORMAL LOW (ref 3.87–5.11)
RDW: 13.9 % (ref 11.5–15.5)
WBC: 4.6 10*3/uL (ref 4.0–10.5)
nRBC: 0 % (ref 0.0–0.2)

## 2018-07-14 ENCOUNTER — Other Ambulatory Visit: Payer: Self-pay

## 2018-07-14 ENCOUNTER — Inpatient Hospital Stay: Payer: PPO

## 2018-07-14 DIAGNOSIS — N184 Chronic kidney disease, stage 4 (severe): Secondary | ICD-10-CM | POA: Diagnosis not present

## 2018-07-14 DIAGNOSIS — E538 Deficiency of other specified B group vitamins: Secondary | ICD-10-CM

## 2018-07-14 DIAGNOSIS — N189 Chronic kidney disease, unspecified: Secondary | ICD-10-CM

## 2018-07-14 DIAGNOSIS — E876 Hypokalemia: Secondary | ICD-10-CM

## 2018-07-14 DIAGNOSIS — C50412 Malignant neoplasm of upper-outer quadrant of left female breast: Secondary | ICD-10-CM

## 2018-07-14 DIAGNOSIS — D631 Anemia in chronic kidney disease: Secondary | ICD-10-CM

## 2018-07-14 DIAGNOSIS — M8588 Other specified disorders of bone density and structure, other site: Secondary | ICD-10-CM

## 2018-07-14 LAB — CBC
HCT: 32.5 % — ABNORMAL LOW (ref 36.0–46.0)
Hemoglobin: 10.7 g/dL — ABNORMAL LOW (ref 12.0–15.0)
MCH: 32.1 pg (ref 26.0–34.0)
MCHC: 32.9 g/dL (ref 30.0–36.0)
MCV: 97.6 fL (ref 80.0–100.0)
Platelets: 210 10*3/uL (ref 150–400)
RBC: 3.33 MIL/uL — ABNORMAL LOW (ref 3.87–5.11)
RDW: 14 % (ref 11.5–15.5)
WBC: 5.2 10*3/uL (ref 4.0–10.5)
nRBC: 0 % (ref 0.0–0.2)

## 2018-07-19 DIAGNOSIS — E222 Syndrome of inappropriate secretion of antidiuretic hormone: Secondary | ICD-10-CM | POA: Diagnosis not present

## 2018-07-21 ENCOUNTER — Ambulatory Visit: Payer: PPO | Admitting: General Surgery

## 2018-07-27 ENCOUNTER — Emergency Department
Admission: EM | Admit: 2018-07-27 | Discharge: 2018-07-27 | Disposition: A | Discharge status: Home / Self Care | Payer: PPO | Attending: Emergency Medicine | Admitting: Emergency Medicine

## 2018-07-27 ENCOUNTER — Other Ambulatory Visit: Payer: Self-pay | Admitting: *Deleted

## 2018-07-27 ENCOUNTER — Emergency Department: Payer: PPO

## 2018-07-27 ENCOUNTER — Other Ambulatory Visit: Payer: Self-pay

## 2018-07-27 ENCOUNTER — Encounter: Payer: Self-pay | Admitting: Emergency Medicine

## 2018-07-27 ENCOUNTER — Encounter: Payer: Self-pay | Admitting: *Deleted

## 2018-07-27 DIAGNOSIS — R935 Abnormal findings on diagnostic imaging of other abdominal regions, including retroperitoneum: Secondary | ICD-10-CM | POA: Diagnosis not present

## 2018-07-27 DIAGNOSIS — R1032 Left lower quadrant pain: Secondary | ICD-10-CM | POA: Diagnosis not present

## 2018-07-27 DIAGNOSIS — R109 Unspecified abdominal pain: Secondary | ICD-10-CM

## 2018-07-27 DIAGNOSIS — R1011 Right upper quadrant pain: Secondary | ICD-10-CM | POA: Diagnosis not present

## 2018-07-27 DIAGNOSIS — N189 Chronic kidney disease, unspecified: Secondary | ICD-10-CM | POA: Diagnosis not present

## 2018-07-27 DIAGNOSIS — Q842 Other congenital malformations of hair: Secondary | ICD-10-CM | POA: Diagnosis not present

## 2018-07-27 DIAGNOSIS — L309 Dermatitis, unspecified: Secondary | ICD-10-CM | POA: Diagnosis not present

## 2018-07-27 DIAGNOSIS — R93819 Abnormal radiologic findings on diagnostic imaging of unspecified testicle: Secondary | ICD-10-CM | POA: Diagnosis not present

## 2018-07-27 DIAGNOSIS — Z1231 Encounter for screening mammogram for malignant neoplasm of breast: Secondary | ICD-10-CM | POA: Diagnosis not present

## 2018-07-27 DIAGNOSIS — R519 Headache, unspecified: Secondary | ICD-10-CM | POA: Diagnosis not present

## 2018-07-27 DIAGNOSIS — E042 Nontoxic multinodular goiter: Secondary | ICD-10-CM | POA: Diagnosis not present

## 2018-07-27 DIAGNOSIS — Z79899 Other long term (current) drug therapy: Secondary | ICD-10-CM | POA: Diagnosis not present

## 2018-07-27 DIAGNOSIS — D440 Neoplasm of uncertain behavior of thyroid gland: Secondary | ICD-10-CM | POA: Diagnosis not present

## 2018-07-27 DIAGNOSIS — R441 Visual hallucinations: Secondary | ICD-10-CM | POA: Diagnosis not present

## 2018-07-27 DIAGNOSIS — K219 Gastro-esophageal reflux disease without esophagitis: Secondary | ICD-10-CM | POA: Diagnosis not present

## 2018-07-27 DIAGNOSIS — E039 Hypothyroidism, unspecified: Secondary | ICD-10-CM | POA: Diagnosis not present

## 2018-07-27 DIAGNOSIS — R112 Nausea with vomiting, unspecified: Secondary | ICD-10-CM | POA: Diagnosis not present

## 2018-07-27 DIAGNOSIS — S299XXA Unspecified injury of thorax, initial encounter: Secondary | ICD-10-CM | POA: Diagnosis not present

## 2018-07-27 DIAGNOSIS — R600 Localized edema: Secondary | ICD-10-CM | POA: Diagnosis not present

## 2018-07-27 DIAGNOSIS — R001 Bradycardia, unspecified: Secondary | ICD-10-CM | POA: Diagnosis not present

## 2018-07-27 DIAGNOSIS — Z7982 Long term (current) use of aspirin: Secondary | ICD-10-CM | POA: Diagnosis not present

## 2018-07-27 DIAGNOSIS — I129 Hypertensive chronic kidney disease with stage 1 through stage 4 chronic kidney disease, or unspecified chronic kidney disease: Secondary | ICD-10-CM | POA: Insufficient documentation

## 2018-07-27 DIAGNOSIS — E6609 Other obesity due to excess calories: Secondary | ICD-10-CM | POA: Diagnosis not present

## 2018-07-27 DIAGNOSIS — L853 Xerosis cutis: Secondary | ICD-10-CM | POA: Diagnosis not present

## 2018-07-27 DIAGNOSIS — M8589 Other specified disorders of bone density and structure, multiple sites: Secondary | ICD-10-CM | POA: Diagnosis not present

## 2018-07-27 DIAGNOSIS — R9389 Abnormal findings on diagnostic imaging of other specified body structures: Secondary | ICD-10-CM

## 2018-07-27 DIAGNOSIS — Z87891 Personal history of nicotine dependence: Secondary | ICD-10-CM | POA: Insufficient documentation

## 2018-07-27 DIAGNOSIS — Z8673 Personal history of transient ischemic attack (TIA), and cerebral infarction without residual deficits: Secondary | ICD-10-CM | POA: Diagnosis not present

## 2018-07-27 DIAGNOSIS — R7989 Other specified abnormal findings of blood chemistry: Secondary | ICD-10-CM | POA: Diagnosis not present

## 2018-07-27 DIAGNOSIS — D509 Iron deficiency anemia, unspecified: Secondary | ICD-10-CM | POA: Diagnosis not present

## 2018-07-27 DIAGNOSIS — E061 Subacute thyroiditis: Secondary | ICD-10-CM | POA: Diagnosis not present

## 2018-07-27 DIAGNOSIS — K8020 Calculus of gallbladder without cholecystitis without obstruction: Secondary | ICD-10-CM | POA: Diagnosis not present

## 2018-07-27 DIAGNOSIS — M2012 Hallux valgus (acquired), left foot: Secondary | ICD-10-CM | POA: Diagnosis not present

## 2018-07-27 DIAGNOSIS — N959 Unspecified menopausal and perimenopausal disorder: Secondary | ICD-10-CM | POA: Diagnosis not present

## 2018-07-27 DIAGNOSIS — R1013 Epigastric pain: Secondary | ICD-10-CM | POA: Diagnosis not present

## 2018-07-27 DIAGNOSIS — R079 Chest pain, unspecified: Secondary | ICD-10-CM | POA: Diagnosis not present

## 2018-07-27 LAB — URINALYSIS, COMPLETE (UACMP) WITH MICROSCOPIC
Bacteria, UA: NONE SEEN
Bilirubin Urine: NEGATIVE
Glucose, UA: 50 mg/dL — AB
Hgb urine dipstick: NEGATIVE
Ketones, ur: 5 mg/dL — AB
Leukocytes,Ua: NEGATIVE
Nitrite: NEGATIVE
Protein, ur: 30 mg/dL — AB
Specific Gravity, Urine: 1.01 (ref 1.005–1.030)
Squamous Epithelial / HPF: NONE SEEN (ref 0–5)
pH: 7 (ref 5.0–8.0)

## 2018-07-27 LAB — CBC WITH DIFFERENTIAL/PLATELET
Abs Immature Granulocytes: 0.03 10*3/uL (ref 0.00–0.07)
Basophils Absolute: 0.1 10*3/uL (ref 0.0–0.1)
Basophils Relative: 1 %
Eosinophils Absolute: 0 10*3/uL (ref 0.0–0.5)
Eosinophils Relative: 0 %
HCT: 31.8 % — ABNORMAL LOW (ref 36.0–46.0)
Hemoglobin: 10.7 g/dL — ABNORMAL LOW (ref 12.0–15.0)
Immature Granulocytes: 0 %
Lymphocytes Relative: 7 %
Lymphs Abs: 0.6 10*3/uL — ABNORMAL LOW (ref 0.7–4.0)
MCH: 31.8 pg (ref 26.0–34.0)
MCHC: 33.6 g/dL (ref 30.0–36.0)
MCV: 94.6 fL (ref 80.0–100.0)
Monocytes Absolute: 0.7 10*3/uL (ref 0.1–1.0)
Monocytes Relative: 7 %
Neutro Abs: 8.1 10*3/uL — ABNORMAL HIGH (ref 1.7–7.7)
Neutrophils Relative %: 85 %
Platelets: 258 10*3/uL (ref 150–400)
RBC: 3.36 MIL/uL — ABNORMAL LOW (ref 3.87–5.11)
RDW: 14.1 % (ref 11.5–15.5)
WBC: 9.6 10*3/uL (ref 4.0–10.5)
nRBC: 0 % (ref 0.0–0.2)

## 2018-07-27 LAB — COMPREHENSIVE METABOLIC PANEL
ALT: 13 U/L (ref 0–44)
AST: 22 U/L (ref 15–41)
Albumin: 4.2 g/dL (ref 3.5–5.0)
Alkaline Phosphatase: 62 U/L (ref 38–126)
Anion gap: 10 (ref 5–15)
BUN: 19 mg/dL (ref 8–23)
CO2: 23 mmol/L (ref 22–32)
Calcium: 9.3 mg/dL (ref 8.9–10.3)
Chloride: 98 mmol/L (ref 98–111)
Creatinine, Ser: 1.03 mg/dL — ABNORMAL HIGH (ref 0.44–1.00)
GFR calc Af Amer: >60 mL/min (ref >60)
GFR calc non Af Amer: 54 mL/min — ABNORMAL LOW (ref >60)
Glucose, Bld: 122 mg/dL — ABNORMAL HIGH (ref 70–99)
Potassium: 3.8 mmol/L (ref 3.5–5.1)
Sodium: 131 mmol/L — ABNORMAL LOW (ref 135–145)
Total Bilirubin: 0.5 mg/dL (ref 0.3–1.2)
Total Protein: 8.7 g/dL — ABNORMAL HIGH (ref 6.5–8.1)

## 2018-07-27 LAB — LIPASE, BLOOD: Lipase: 33 U/L (ref 11–51)

## 2018-07-27 LAB — TROPONIN I: Troponin I: 0.03 ng/mL (ref <0.03)

## 2018-07-27 MED ORDER — IOHEXOL 300 MG/ML  SOLN
100.0000 mL | Freq: Once | INTRAMUSCULAR | Status: AC | PRN
  Administered 2018-07-27: 100 mL via INTRAVENOUS

## 2018-07-27 MED ORDER — AMLODIPINE BESYLATE 5 MG PO TABS
2.5000 mg | ORAL_TABLET | Freq: Once | ORAL | Status: AC
  Administered 2018-07-27: 2.5 mg via ORAL
  Filled 2018-07-27: qty 1

## 2018-07-27 MED ORDER — KETOROLAC TROMETHAMINE 30 MG/ML IJ SOLN
15.0000 mg | Freq: Once | INTRAMUSCULAR | Status: AC
  Administered 2018-07-27: 15 mg via INTRAVENOUS
  Filled 2018-07-27: qty 1

## 2018-07-27 MED ORDER — METOPROLOL SUCCINATE ER 50 MG PO TB24
25.0000 mg | ORAL_TABLET | Freq: Once | ORAL | Status: AC
  Administered 2018-07-27: 25 mg via ORAL
  Filled 2018-07-27: qty 1

## 2018-07-27 MED ORDER — FAMOTIDINE IN NACL 20-0.9 MG/50ML-% IV SOLN
20.0000 mg | Freq: Once | INTRAVENOUS | Status: AC
  Administered 2018-07-27: 20 mg via INTRAVENOUS
  Filled 2018-07-27: qty 50

## 2018-07-27 MED ORDER — FENTANYL CITRATE (PF) 100 MCG/2ML IJ SOLN
50.0000 ug | Freq: Once | INTRAMUSCULAR | Status: AC
  Administered 2018-07-27: 50 ug via INTRAVENOUS
  Filled 2018-07-27: qty 2

## 2018-07-27 MED ORDER — ONDANSETRON 4 MG PO TBDP: 4.0000 mg | ORAL_TABLET | Freq: Three times a day (TID) | ORAL | 0 refills | Status: DC | PRN

## 2018-07-27 MED ORDER — ONDANSETRON HCL 4 MG/2ML IJ SOLN
4.0000 mg | Freq: Once | INTRAMUSCULAR | Status: AC
  Administered 2018-07-27: 09:00:00 4 mg via INTRAVENOUS
  Filled 2018-07-27: qty 2

## 2018-07-27 MED ORDER — HYDRALAZINE HCL 20 MG/ML IJ SOLN
5.0000 mg | Freq: Once | INTRAMUSCULAR | Status: AC
  Administered 2018-07-27: 5 mg via INTRAVENOUS
  Filled 2018-07-27: qty 1

## 2018-07-27 MED ORDER — HYDRALAZINE HCL 50 MG PO TABS
25.0000 mg | ORAL_TABLET | Freq: Once | ORAL | Status: AC
  Administered 2018-07-27: 25 mg via ORAL
  Filled 2018-07-27: qty 1

## 2018-07-27 MED ORDER — LISINOPRIL 10 MG PO TABS
20.0000 mg | ORAL_TABLET | Freq: Once | ORAL | Status: AC
  Administered 2018-07-27: 20 mg via ORAL
  Filled 2018-07-27: qty 2

## 2018-07-27 NOTE — ED Notes (Signed)
Pt has eaten all the PB and crackers she was given. MD notified.

## 2018-07-27 NOTE — Consult Note (Addendum)
[facility] SURGICAL CONSULTATION NOTE (initial) - cpt: 35597 (Outpatient/ED)   HISTORY OF PRESENT ILLNESS (HPI):  73 y.o. female presented to [facility] ED today for evaluation of nausea and emesis. Patient reports the acute onset of nausea and emesis this morning around 0500. Reports 4-5 episodes of non-bloody, non-bilious emesis. She endorses associated epigastric abdominal pain with these symptoms. She had not tried anything at home to manage this. No complaints of fever, chills, cough, congestion, CP, SOB, diarrhea, or bladder changes. She denied any history of similar in the past. She does endorse eating a roast beef sandwich last night but did not think these were related. She does have a history of gastric ulcers and had an EGD with biopsies in January but is unsure of the results. Work up in the ED was concerning for cholelithiasis on [place] only without any evidence of cholecystitis or leukocytosis.    Surgery is consulted by emergency medicine physician Dr. Rudene Re, MD in this context for evaluation and management of nausea, emesis, and abdominal pain.   PAST MEDICAL HISTORY (PMH):  Past Medical History:  Diagnosis Date  . Anemia   . Anxiety disorder   . Breast cancer of upper-outer quadrant of left female breast ([place]) 11/2015   pT2 pN0(i+).;ER+; PR +, her 2 neu not overexpressed.  Mastectomy, SLN, Mammoprint: Low risk.   . Cancer ([place]) 12/03/2015   left breast/ INVASIVE LOBULAR CARCINOMA.   . Chronic kidney disease   . Cough    lingering, mild, finished Prednisone and anitbiotic 11/03/15  . Family history of adverse reaction to anesthesia    sister - PONV  . GERD (gastroesophageal reflux disease)   . Hypertension   . Osteopenia   . Osteoporosis   . Pericarditis    diagnonsed June, 2010, unclear etiology as of yer  . Personal history of tobacco use, presenting hazards to health 10/31/2015  . Scleroderma ([facility])    ONLY ON SKIN-MILD  . UTI (lower urinary tract  infection)   . Wears dentures    full upper     PAST SURGICAL HISTORY ([place]):  Past Surgical History:  Procedure Laterality Date  . BREAST BIOPSY Right 2012   core - neg  . BREAST BIOPSY Left 12/03/2015   INVASIVE LOBULAR CARCINOMA.   Marland Kitchen CARDIAC CATHETERIZATION    . CATARACT EXTRACTION W/ INTRAOCULAR LENS IMPLANT Right   . COLONOSCOPY WITH PROPOFOL N/A 11/08/2015   Procedure: COLONOSCOPY WITH PROPOFOL;  Surgeon: Lucilla Lame, MD;  Location: [place];  Service: Endoscopy;  Laterality: N/A;  . ESOPHAGOGASTRODUODENOSCOPY (EGD) WITH PROPOFOL N/A 11/18/2017   Procedure: ESOPHAGOGASTRODUODENOSCOPY (EGD) WITH PROPOFOL;  Surgeon: Virgel Manifold, MD;  Location: [facility] ENDOSCOPY;  Service: Endoscopy;  Laterality: N/A;  . ESOPHAGOGASTRODUODENOSCOPY (EGD) WITH PROPOFOL N/A 02/15/2018   Procedure: ESOPHAGOGASTRODUODENOSCOPY (EGD) WITH PROPOFOL;  Surgeon: Jonathon Bellows, MD;  Location: [facility] ENDOSCOPY;  Service: Gastroenterology;  Laterality: N/A;  . ESOPHAGOGASTRODUODENOSCOPY (EGD) WITH PROPOFOL N/A 05/10/2018   Procedure: ESOPHAGOGASTRODUODENOSCOPY (EGD) WITH BIOPSIES;  Surgeon: Virgel Manifold, MD;  Location: [place];  Service: Endoscopy;  Laterality: N/A;  . EVACUATION BREAST HEMATOMA Left 01/14/2016   Procedure: EVACUATION HEMATOMA BREAST;  Surgeon: Robert Bellow, MD;  Location: [facility] ORS;  Service: General;  Laterality: Left;  . EYE SURGERY    . MASTECTOMY Left 2017   complete mastectomy  . MASTECTOMY W/ SENTINEL NODE BIOPSY Left 12/26/2015   Procedure: MASTECTOMY WITH SENTINEL LYMPH NODE BIOPSY;  Surgeon: Robert Bellow, MD;  Location: [facility]  ORS;  Service: General;  Laterality: Left;  . RIGHT/LEFT HEART CATH AND CORONARY ANGIOGRAPHY N/A 07/22/2016   Procedure: Right/Left Heart Cath and Coronary Angiography;  Surgeon: Minna Merritts, MD;  Location: [place] CV LAB;  Service: Cardiovascular;  Laterality: N/A;  . TUBAL LIGATION    . VESICOVAGINAL FISTULA CLOSURE  W/ TAH       MEDICATIONS:  Prior to Admission medications   Medication Sig Start Date End Date Taking? Authorizing Provider  albuterol (PROVENTIL) (2.5 MG/3ML) 0.083% nebulizer solution Take 3 mLs (2.5 mg total) by nebulization every 6 (six) hours as needed for wheezing or shortness of breath. 06/06/15  Yes Mar Daring, PA-C  amLODipine (NORVASC) 2.5 MG tablet Take 1 tablet (2.5 mg total) by mouth daily. 02/23/18  Yes Mayo, Pete Pelt, MD  fluticasone [facility]) 50 MCG/ACT nasal spray Place 2 sprays into both nostrils daily. 02/23/18  Yes Mayo, Pete Pelt, MD  furosemide (LASIX) 20 MG tablet Take 20 mg by mouth daily.  11/08/17  Yes [provider]  hydrALAZINE (APRESOLINE) 25 MG tablet Take 1 tablet (25 mg total) by mouth 2 (two) times daily. 04/29/17  Yes Jerrol Banana., MD  lisinopril (PRINIVIL,ZESTRIL) 20 MG tablet Take 1 tablet (20 mg total) by mouth daily. 06/26/17  Yes Gladstone Lighter, MD  metoprolol succinate (TOPROL-XL) 25 MG 24 hr tablet Take 1 tablet (25 mg total) by mouth daily. 05/24/17  Yes Jerrol Banana., MD  nitroGLYCERIN (NITROSTAT) 0.4 MG SL tablet Place 1 tablet (0.4 mg total) under the tongue every 5 (five) minutes as needed for chest pain. 07/17/16  Yes Mody, Ulice Bold, MD  potassium chloride SA (K-DUR,KLOR-CON) 20 MEQ tablet Take 1 tablet (20 mEq total) by mouth daily. Patient taking differently: Take 20 mEq by mouth 2 (two) times daily.  05/31/18  Yes Minna Merritts, MD  simvastatin (ZOCOR) 40 MG tablet Take 40 mg by mouth daily. 07/06/18  Yes [provider]  tamoxifen (NOLVADEX) 20 MG tablet Take 1 tablet (20 mg total) by mouth daily. 08/02/17  Yes Lequita Asal, MD     ALLERGIES:  Allergies  Allergen Reactions  . Pimenta Nausea And Vomiting  . Tomato Rash     SOCIAL HISTORY:  Social History   Socioeconomic History  . Marital status: Married    Spouse name: Not on file  . Number of children: 2  . Years of education: Not on  file  . Highest education level: Not on file  Occupational History  . Not on file  Social Needs  . Financial resource strain: Not on file  . Food insecurity:    Worry: Not on file    Inability: Not on file  . Transportation needs:    Medical: Not on file    Non-medical: Not on file  Tobacco Use  . Smoking status: Former Smoker    Packs/day: 0.75    Years: 40.00    Pack years: 30.00    Types: Cigarettes    Last attempt to quit: 11/07/2003    Years since quitting: 14.7  . Smokeless tobacco: Never Used  Substance and Sexual Activity  . Alcohol use: Yes    Alcohol/week: 1.0 - 3.0 standard drinks    Types: 1 - 3 Glasses of wine per week    Comment: Occasionally.    . Drug use: No  . Sexual activity: Not on file  Lifestyle  . Physical activity:    Days per week: Not on file  Minutes per session: Not on file  . Stress: Not on file  Relationships  . Social connections:    Talks on phone: Not on file    Gets together: Not on file    Attends religious service: Not on file    Active member of club or organization: Not on file    Attends meetings of clubs or organizations: Not on file    Relationship status: Not on file  . Intimate partner violence:    Fear of current or ex partner: Not on file    Emotionally abused: Not on file    Physically abused: Not on file    Forced sexual activity: Not on file  Other Topics Concern  . Not on file  Social History Narrative   She drinks 1 to 2 caffeinated beverages a day     FAMILY HISTORY:  Family History  Problem Relation Age of Onset  . Heart failure Mother   . Epilepsy Mother   . COPD Father   . Heart disease Father   . Anxiety disorder Sister   . Arthritis Brother   . Heart disease Brother   . Vaginal cancer Paternal Grandmother   . Heart attack Paternal Grandfather   . COPD Brother   . Kidney failure Brother   . COPD Brother   . Arthritis Sister   . Uterine cancer Other   . Diabetes Other   . Colon cancer Neg Hx    . Stomach cancer Neg Hx   . Breast cancer Neg Hx       REVIEW OF SYSTEMS:  Review of Systems  Constitutional: Negative for chills and fever.  HENT: Negative for congestion and sinus pain.   Respiratory: Negative for cough and shortness of breath.   Cardiovascular: Negative for chest pain and palpitations.  Gastrointestinal: Positive for abdominal pain, nausea and vomiting. Negative for blood in stool, constipation and diarrhea.  Genitourinary: Negative for dysuria and urgency.  Neurological: Negative for dizziness and headaches.  All other systems reviewed and are negative.   VITAL SIGNS:  Temp:  [97.8 F (36.6 C)] 97.8 F (36.6 C) (04/01 0851) Pulse Rate:  [58-69] 69 (04/01 1130) Resp:  [14-20] 20 (04/01 1130) BP: (183-212)/(71-87) 183/74 (04/01 1130) SpO2:  [97 %-99 %] 97 % (04/01 1130) Weight:  [62.6 kg] 62.6 kg (04/01 0853)     Height: 5\' 1"  (154.9 cm) Weight: 62.6 kg BMI (Calculated): 26.09   INTAKE/OUTPUT:  This shift: No intake/output data recorded.  Last 2 shifts: @IOLAST2SHIFTS @   PHYSICAL EXAM:  Physical Exam Vitals signs and nursing note reviewed.  Constitutional:      General: She is not in acute distress.    Appearance: She is well-developed. She is not ill-appearing.  HENT:     Head: Normocephalic and atraumatic.  Eyes:     General: No scleral icterus.    Extraocular Movements: Extraocular movements intact.  Cardiovascular:     Rate and Rhythm: Normal rate and regular rhythm.     Heart sounds: Normal heart sounds. No murmur. No friction rub. No gallop.   Pulmonary:     Effort: Pulmonary effort is normal. No respiratory distress.     Breath sounds: Normal breath sounds. No wheezing or rhonchi.  Abdominal:     General: There is no distension.     Palpations: Abdomen is soft.     Tenderness: There is abdominal tenderness in the right upper quadrant. There is no guarding or rebound. Negative signs include Murphy's sign.  Genitourinary:    Comments:  Deferred Skin:    General: Skin is warm and dry.     Coloration: Skin is not jaundiced or pale.  Neurological:     General: No focal deficit present.     Mental Status: She is alert and oriented to person, place, and time.  Psychiatric:        Mood and Affect: Mood normal.        Behavior: Behavior normal.       Labs:  CBC Latest Ref Rng & Units 07/27/2018 07/14/2018 06/30/2018  WBC 4.0 - 10.5 K/uL 9.6 5.2 4.6  Hemoglobin 12.0 - 15.0 g/dL 10.7(L) 10.7(L) 10.4(L)  Hematocrit 36.0 - 46.0 % 31.8(L) 32.5(L) 30.8(L)  Platelets 150 - 400 K/uL 258 210 211   CMP Latest Ref Rng & Units 07/27/2018 06/16/2018 03/08/2018  Glucose 70 - 99 mg/dL 122(H) 83 92  BUN 8 - 23 mg/dL 19 23 26(H)  Creatinine 0.44 - 1.00 mg/dL 1.03(H) 1.14(H) 1.19(H)  Sodium 135 - 145 mmol/L 131(L) 131(L) 131(L)  Potassium 3.5 - 5.1 mmol/L 3.8 4.4 3.3(L)  Chloride 98 - 111 mmol/L 98 99 102  CO2 22 - 32 mmol/L 23 25 23   Calcium 8.9 - 10.3 mg/dL 9.3 9.1 9.4  Total Protein 6.5 - 8.1 g/dL 8.7(H) 8.0 7.6  Total Bilirubin 0.3 - 1.2 mg/dL 0.5 0.2(L) 0.3  Alkaline Phos 38 - 126 U/L 62 58 38  AST 15 - 41 U/L 22 21 19   ALT 0 - 44 U/L 13 11 19     Imaging studies:   CT Abdomen/Pelvis (07/27/2018) personally reviewed and radiologist report reviewed:  IMPRESSION: 1) Diffuse colonic diverticulosis.  No active diverticulitis. 2) Heavily calcified aorta and iliac vessels.  No aneurysm. 3) Small hiatal hernia. 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 5) Fluid and/or endometrial thickening noted within the uterus. This is an abnormal finding for the patient's age. Consider further evaluation with pelvic ultrasound.  KUB (07/27/2018) personally reviewed and radiologist report reviewed:  IMPRESSION: Negative  CT Abdomen/Pelvis (07/27/2018) personally reviewed and radiologist report reviewed:  IMPRESSION: 1) Cholelithiasis.  No sonographic evidence of acute cholecystitis. 2) Suspect area of focal fatty infiltration in the left  hepatic lobe.   Assessment/Plan: (ICD-10's: R10.11) 73 y.o. female with RUQ abdominal pain, nausea, and emesis with cholelithiasis seen on [place] with negative Murphy's sign and without leukocytosis, LFT changes, or evidence of cholecystitis.    - Consider PO challenge  - No evidence of cholecystitis at this time, no indications for emergent surgical intervention at this time.   - If pain, nausea, or emesis is intractable could consider admission to medicine, trend LFTs/WBC. If pain persists, could consider HIDA scan and/or cholecystostomy tube placement.   - Otherwise, patient can follow up as outpatient for possible symptomatic cholelithiasis, however, given current situation with COVID this may be a few weeks   - pain control prn; antiemetics prn.    All of the above findings and recommendations were discussed with the patient and the medical team, and all of patient's questions were answered to her expressed satisfaction.  Thank you for the opportunity to participate in this patient's care.   -- Edison Simon, PA-C [facility] 07/27/2018, 12:05 PM [phone] M-F: 7am - 4pm  I saw and evaluated the patient.  I personally performed the physical exam and took the patient history documented above. I also reviewed the imaging and labs in person.  Cholecystitis appears unlikely; may have biliary colic secondary to stone  seen on RUQ U/S (not seen on CT). I agree with the above documentation, exam, and plan, which I have edited where appropriate. Fredirick Maudlin  12:29 PM

## 2018-07-27 NOTE — ED Notes (Signed)
Patient transported to Ultrasound 

## 2018-07-27 NOTE — ED Notes (Signed)
Spoken to husband per pt request to update on pt status.

## 2018-07-27 NOTE — Patient Outreach (Signed)
[facility]) Care Management  07/27/2018  Bianca Shaw [birth year] [id number]   Telephone Screen  Referral Date: 07/27/2018 Referral Source:  Nurse call center- [facility] Referral Reason: 07/27/18 Vomiting- Bianca Shaw, callers wife is having upper abdominal pain and vomiting No fever PMH stage 3 renal disease, hyponatremia, HTN, high calcium level at times Bianca Shaw B recommends pt go to ED now- [place]: [facility]   Outreach attempt # 1 successful  Patient and her husband are able to verify HIPAA Reviewed and addressed referral to [facility] with patient who allowed CM to speak with Bianca Shaw as she was resting from recently getting home from [facility] ED   Bianca Shaw states Bianca Shaw was dx with Gall stones, is resting and is no longer having any pain nor vomiting  Transition of care assessment reviewed Bianca Shaw has prn medications, Zofran  There is no further N/V, RUQ abdominal pain or fever.  Bianca Shaw discussed an elevated BP of prior to going to ED CM notes ED BP of and discussed increased BP related to pain. CM encouraged further home monitoring of the BP, and temperature  Bianca Shaw voiced concern that Bianca Shaw may not be able to be seen by the recommended surgeon listed in the after summary/discharge sheets in 1 week. He discussed this in relation to the present Coronavirus pandemic and that Bianca Shaw's elective cataract surgery had been postponed.   CM discussed with him the importance of contacting the surgeon, primary MD and OB GYN offices as recommended. Cm discussed the importance of Bianca Shaw being contacted first to see if further evaluation of Bianca Shaw's gall bladder is needed. He voiced understanding   Bianca Shaw reports Bianca Shaw has not been seen in a while   CM discussed with Bianca Shaw that [facility] RN CM will route this note to the providers but will contact the surgeon office to discuss Bianca Shaw's ED visit with staff.  [facility] RN CM  spoke with Bianca Shaw at Bianca Shaw's office. Bianca Shaw confirms that their process during the Coronavirus precautions is to have the surgeon, Bianca Shaw review the ED notes to determine when to put Bianca Shaw on the schedule Leave up to provider Bianca Shaw after she review the notes to schedule pt    Social: Bianca Shaw lives at home with her husband Bianca Shaw She is retired She is independent with her care needs She and Bianca Wurtz denies transportation concerns with getting to medical appointments   Conditions: Cholelithiasis/Gallstones without obstruction, thickened endometrium, GERD, Peptic ulcer, Left breast cancer with mastectomy in 2017,  HTN, NSTEMI, CAD, Chronic airflow limitation, seasonal allergic rhinitis osteoporosis, anemia associated with chronic renal failure, anxiety, hypercholesteremia, tobacco use, dyspnea, low vitamin B12 level, scleroderma, bloodgood disease,   DME: none  Medications: denies concerns with taking medications as prescribed, affording medications, side effects of medications and questions about medications    Appointments:  f/u with Surgeon Bianca Shaw and [place] [facility] ASAP for a visit in 1 week,  F/u with primary MD Bianca Shaw in 2 days,  07/28/18 10 am [place] [facility] 08/11/18  [facility]  Advance Directives: Denies need for assist with advance directives  Denies need for assist with changes to present advance directives     Consent: [facility] RN CM reviewed [facility] services with patient. Patient gave verbal consent for services [facility] telephonic RN CM.   Plan: [facility] RN CM will follow up with Bianca Parco within the next 7  business days   Pt encouraged to return a call to [place] CM prn. CM left her contact number and availability with Bianca Carby. Cm confirmed the nurse call center contact number   [place] sent a successful outreach letter as discussed with [facility] brochure enclosed for review  Routed note to MDs   Bianca Millin L.  Lavina Hamman, RN, BSN, [place] Coordinator Office number [phone] Mobile number [phone]  Main [facility] number [phone] Fax number [phone]

## 2018-07-27 NOTE — Discharge Instructions (Addendum)
As explained to you, follow-up with OB/GYN for further evaluation of your thickened endometrium (lining of the uterus).  Follow-up with Dr. Celine Ahr, surgeon in a week for further evaluation of her gallbladder.  However if your pain returns, if your nausea and vomiting returns, or if you develop fever please return to the emergency room immediately.

## 2018-07-27 NOTE — ED Triage Notes (Signed)
Pt in via POV, complaints of epigastric pain w/ N/V since 0500 today.  Denies diarrhea.

## 2018-07-27 NOTE — ED Provider Notes (Signed)
[facility] Emergency Department Provider Note  ____________________________________________  Time seen: Approximately 9:51 AM  I have reviewed the triage vital signs and the nursing notes.   HISTORY  Chief Complaint Abdominal Pain   HPI Bianca Shaw is a 73 y.o. female with history as listed below including peptic ulcer disease, GERD, gastritis who presents for evaluation of abdominal pain.  Patient reports that she woke up this morning with epigastric and right upper quadrant abdominal pain that she describes as sharp, moderate, constant and nonradiating.  She has had several episodes of nonbloody nonbilious emesis.  She is complaining of significant nausea.  No diarrhea, no constipation, no abdominal distention, no prior abdominal surgeries.  Patient is passing flatus and had a normal bowel movement yesterday.  No fever or chills, no dysuria or hematuria, no chest pain or shortness of breath.  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  Mastectomy, SLN, Mammoprint: Low risk.    Cancer ([place]) 12/03/2015   left breast/ INVASIVE LOBULAR CARCINOMA.    Chronic kidney disease    Cough    lingering, mild, finished Prednisone and anitbiotic 11/03/15   Family history of adverse reaction to anesthesia    sister - PONV   GERD (gastroesophageal reflux disease)    Hypertension    Osteopenia    Osteoporosis    Pericarditis    diagnonsed June, 2010, unclear etiology as of yer   Personal history of tobacco use, presenting hazards to health 10/31/2015   Scleroderma ([place])    ONLY ON SKIN-MILD   UTI (lower urinary tract infection)    Wears dentures    full upper    Patient Active Problem List   Diagnosis Date Noted   History of gastric ulcer    Columnar epithelial-lined lower esophagus      Stomach irritation    Acute peptic ulcer of stomach    Abdominal pain, epigastric    Intractable vomiting with nausea    Anemia associated with chronic renal failure 07/20/2017   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   Localized morphea 07/05/2017   Goals of care, counseling/discussion 06/29/2017   Hypercalcemia 06/22/2017   Gait abnormality 06/16/2017   Weakness 06/16/2017   Low vitamin B12 level 04/09/2017   Mass of left chest wall 12/15/2016   Elevated troponin 07/16/2016   Non-ST elevation (NSTEMI) myocardial infarction ([place]) 07/16/2016   Hypokalemia 07/16/2016   Gastroenteritis 07/16/2016   Dyspnea    Coronary artery disease involving native coronary artery of native heart with unstable angina pectoris ([place])    Hematoma (nontraumatic) of breast 01/13/2016   Scleroderma ([place]) 12/12/2015   Breast cancer of upper-outer quadrant of left female breast ([place]) 12/06/2015   Special screening for malignant neoplasms, colon    Personal history of tobacco use, presenting hazards to health 10/31/2015   Anxiety 05/23/2015   CAFL (chronic airflow limitation) ([place]) 05/23/2015   Bloodgood disease 05/23/2015   Essential (primary) hypertension 05/23/2015   Acid reflux 05/23/2015   Hypercholesteremia 05/23/2015   Hyponatremia 05/23/2015   Osteoporosis 05/23/2015   Allergic rhinitis, seasonal 05/23/2015   UNSPECIFIED ANEMIA 10/03/2008    Past Surgical History:  Procedure Laterality Date   BREAST BIOPSY Right 2012   core - neg   BREAST BIOPSY Left 12/03/2015   INVASIVE LOBULAR CARCINOMA.  CARDIAC CATHETERIZATION     CATARACT EXTRACTION W/ INTRAOCULAR LENS IMPLANT Right    COLONOSCOPY WITH PROPOFOL N/A 11/08/2015   Procedure: COLONOSCOPY WITH PROPOFOL;  Surgeon: Lucilla Lame, MD;  Location: [place];  Service: Endoscopy;  Laterality: N/A;   ESOPHAGOGASTRODUODENOSCOPY (EGD) WITH PROPOFOL N/A 11/18/2017   Procedure:  ESOPHAGOGASTRODUODENOSCOPY (EGD) WITH PROPOFOL;  Surgeon: Virgel Manifold, MD;  Location: [facility] ENDOSCOPY;  Service: Endoscopy;  Laterality: N/A;   ESOPHAGOGASTRODUODENOSCOPY (EGD) WITH PROPOFOL N/A 02/15/2018   Procedure: ESOPHAGOGASTRODUODENOSCOPY (EGD) WITH PROPOFOL;  Surgeon: Jonathon Bellows, MD;  Location: [facility] ENDOSCOPY;  Service: Gastroenterology;  Laterality: N/A;   ESOPHAGOGASTRODUODENOSCOPY (EGD) WITH PROPOFOL N/A 05/10/2018   Procedure: ESOPHAGOGASTRODUODENOSCOPY (EGD) WITH BIOPSIES;  Surgeon: Virgel Manifold, MD;  Location: [place];  Service: Endoscopy;  Laterality: N/A;   EVACUATION BREAST HEMATOMA Left 01/14/2016   Procedure: EVACUATION HEMATOMA BREAST;  Surgeon: Robert Bellow, MD;  Location: [facility] ORS;  Service: General;  Laterality: Left;   EYE SURGERY     MASTECTOMY Left 2017   complete mastectomy   MASTECTOMY W/ SENTINEL NODE BIOPSY Left 12/26/2015   Procedure: MASTECTOMY WITH SENTINEL LYMPH NODE BIOPSY;  Surgeon: Robert Bellow, MD;  Location: [facility] ORS;  Service: General;  Laterality: Left;   RIGHT/LEFT HEART CATH AND CORONARY ANGIOGRAPHY N/A 07/22/2016   Procedure: Right/Left Heart Cath and Coronary Angiography;  Surgeon: Minna Merritts, MD;  Location: [place] CV LAB;  Service: Cardiovascular;  Laterality: N/A;   TUBAL LIGATION     VESICOVAGINAL FISTULA CLOSURE W/ TAH      Prior to Admission medications   Medication Sig Start Date End Date Taking? Authorizing Provider  albuterol (PROVENTIL) (2.5 MG/3ML) 0.083% nebulizer solution Take 3 mLs (2.5 mg total) by nebulization every 6 (six) hours as needed for wheezing or shortness of breath. 06/06/15  Yes Mar Daring, PA-C  amLODipine (NORVASC) 2.5 MG tablet Take 1 tablet (2.5 mg total) by mouth daily. 02/23/18  Yes Mayo, Pete Pelt, MD  fluticasone [facility]) 50 MCG/ACT nasal spray Place 2 sprays into both nostrils daily. 02/23/18  Yes Mayo, Pete Pelt, MD  furosemide (LASIX) 20 MG tablet  Take 20 mg by mouth daily.  11/08/17  Yes [provider]  hydrALAZINE (APRESOLINE) 25 MG tablet Take 1 tablet (25 mg total) by mouth 2 (two) times daily. 04/29/17  Yes Jerrol Banana., MD  lisinopril (PRINIVIL,ZESTRIL) 20 MG tablet Take 1 tablet (20 mg total) by mouth daily. 06/26/17  Yes Gladstone Lighter, MD  metoprolol succinate (TOPROL-XL) 25 MG 24 hr tablet Take 1 tablet (25 mg total) by mouth daily. 05/24/17  Yes Jerrol Banana., MD  nitroGLYCERIN (NITROSTAT) 0.4 MG SL tablet Place 1 tablet (0.4 mg total) under the tongue every 5 (five) minutes as needed for chest pain. 07/17/16  Yes Mody, Ulice Bold, MD  potassium chloride SA (K-DUR,KLOR-CON) 20 MEQ tablet Take 1 tablet (20 mEq total) by mouth daily. Patient taking differently: Take 20 mEq by mouth 2 (two) times daily.  05/31/18  Yes Minna Merritts, MD  simvastatin (ZOCOR) 40 MG tablet Take 40 mg by mouth daily. 07/06/18  Yes [provider]  tamoxifen (NOLVADEX) 20 MG tablet Take 1 tablet (20 mg total) by mouth daily. 08/02/17  Yes Corcoran, Drue Second, MD  ondansetron (ZOFRAN ODT) 4 MG disintegrating tablet Take 1 tablet (4 mg total) by mouth every 8 (eight) hours as needed. 07/27/18   Rudene Re, MD    Allergies Pimenta and Tomato  Family History  Problem Relation Age of Onset   Heart failure Mother    Epilepsy Mother    COPD Father    Heart disease Father    Anxiety disorder Sister    Arthritis Brother    Heart disease Brother    Vaginal cancer Paternal Grandmother    Heart attack Paternal Grandfather    COPD Brother    Kidney failure Brother    COPD Brother    Arthritis Sister    Uterine cancer Other    Diabetes Other    Colon cancer Neg Hx    Stomach cancer Neg Hx    Breast cancer Neg Hx     Social History Social History   Tobacco Use   Smoking status: Former Smoker    Packs/day: 0.75    Years: 40.00    Pack years: 30.00    Types: Cigarettes    Last attempt to  quit: 11/07/2003    Years since quitting: 14.7   Smokeless tobacco: Never Used  Substance Use Topics   Alcohol use: Yes    Alcohol/week: 1.0 - 3.0 standard drinks    Types: 1 - 3 Glasses of wine per week    Comment: Occasionally.     Drug use: No    Review of Systems  Constitutional: Negative for fever. Eyes: Negative for visual changes. ENT: Negative for sore throat. Neck: No neck pain  Cardiovascular: Negative for chest pain. Respiratory: Negative for shortness of breath. Gastrointestinal: + epigastric abdominal pain, nausea, and vomiting. No diarrhea. Genitourinary: Negative for dysuria. Musculoskeletal: Negative for back pain. Skin: Negative for rash. Neurological: Negative for headaches, weakness or numbness. Psych: No SI or HI  ____________________________________________   PHYSICAL EXAM:  VITAL SIGNS: ED Triage Vitals  Enc Vitals Group     BP 07/27/18 0851 (!) 212/87     Pulse Rate 07/27/18 0851 (!) 58     Resp 07/27/18 0851 19     Temp 07/27/18 0851 97.8 F (36.6 C)     Temp Source 07/27/18 0851 Oral     SpO2 07/27/18 0851 99 %     Weight 07/27/18 0853 138 lb (62.6 kg)     Height 07/27/18 0853 5\' 1"  (1.549 m)     Head Circumference --      Peak Flow --      Pain Score 07/27/18 0852 8     Pain Loc --      Pain Edu? --      Excl. in [place]? --     Constitutional: Alert and oriented. Well appearing and in no apparent distress. HEENT:      Head: Normocephalic and atraumatic.         Eyes: Conjunctivae are normal. Sclera is non-icteric.       Mouth/Throat: Mucous membranes are moist.       Neck: Supple with no signs of meningismus. Cardiovascular: Regular rate and rhythm. No murmurs, gallops, or rubs. 2+ symmetrical distal pulses are present in all extremities. No JVD. Respiratory: Normal respiratory effort. Lungs are clear to auscultation bilaterally. No wheezes, crackles, or rhonchi.  Gastrointestinal: Soft, tender to palpation over the epigastric and RUQ  with positive Murphy's sign, and non distended with positive bowel sounds. No rebound or guarding. Genitourinary: No CVA tenderness. Musculoskeletal: Nontender with normal range of motion in all extremities. No edema, cyanosis, or erythema of extremities. Neurologic: Normal speech and language. Face is symmetric. Moving all extremities. No gross focal neurologic deficits are appreciated. Skin: Skin is  warm, dry and intact. No rash noted. Psychiatric: Mood and affect are normal. Speech and behavior are normal.  ____________________________________________   LABS (all labs ordered are listed, but only abnormal results are displayed)  Labs Reviewed  URINALYSIS, COMPLETE (UACMP) WITH MICROSCOPIC - Abnormal; Notable for the following components:      Result Value   Color, Urine STRAW (*)    APPearance CLEAR (*)    Glucose, UA 50 (*)    Ketones, ur 5 (*)    Protein, ur 30 (*)    All other components within normal limits  CBC WITH DIFFERENTIAL/PLATELET - Abnormal; Notable for the following components:   RBC 3.36 (*)    Hemoglobin 10.7 (*)    HCT 31.8 (*)    Neutro Abs 8.1 (*)    Lymphs Abs 0.6 (*)    All other components within normal limits  TROPONIN I - Abnormal; Notable for the following components:   Troponin I 0.03 (*)    All other components within normal limits  COMPREHENSIVE METABOLIC PANEL - Abnormal; Notable for the following components:   Sodium 131 (*)    Glucose, Bld 122 (*)    Creatinine, Ser 1.03 (*)    Total Protein 8.7 (*)    GFR calc non Af Amer 54 (*)    All other components within normal limits  LIPASE, BLOOD   ____________________________________________  EKG  ED ECG REPORT I, Rudene Re, the attending physician, personally viewed and interpreted this ECG.  Sinus bradycardia, rate of 58, first-degree AV block, normal QTC, normal axis, no ST elevations or depressions.  Unchanged from  prior. ____________________________________________  RADIOLOGY  I have personally reviewed the images performed during this visit and I agree with the Radiologist's read.   Interpretation by Radiologist:  Ct Abdomen Pelvis W Contrast  Result Date: 07/27/2018 CLINICAL DATA:  Epigastric pain, nausea, vomiting EXAM: CT ABDOMEN AND PELVIS WITH CONTRAST TECHNIQUE: Multidetector CT imaging of the abdomen and pelvis was performed using the standard protocol following bolus administration of intravenous contrast. CONTRAST:  133mL OMNIPAQUE IOHEXOL 300 MG/ML  SOLN COMPARISON:  06/11/2017.  Ultrasound 07/27/2018 FINDINGS: Lower chest: Small hiatal hernia. No confluent airspace opacities or effusions. Hepatobiliary: Scattered calcifications in the liver compatible with old granulomas. Gallbladder unremarkable. No biliary ductal dilatation. Previously seen gallstone by ultrasound cannot be visualized by CT. Pancreas: No focal abnormality or ductal dilatation. Spleen: No focal abnormality.  Normal size. Adrenals/Urinary Tract: Scarring in the upper pole of the right kidney. Small cysts bilaterally. No hydronephrosis. Adrenal glands and urinary bladder unremarkable. Stomach/Bowel: Diffuse colonic diverticulosis. No active diverticulitis. Stomach and small bowel decompressed, grossly unremarkable. Vascular/Lymphatic: Heavily calcified aorta and iliac vessels. No aneurysm or adenopathy. Reproductive: Fluid and/or thickening of the endometrium is abnormal for patient's age. No adnexal mass. Other: No free fluid or free air. Musculoskeletal: No acute bony abnormality. IMPRESSION: Diffuse colonic diverticulosis.  No active diverticulitis. Heavily calcified aorta and iliac vessels.  No aneurysm. Small hiatal hernia. 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 Fluid and/or endometrial thickening noted within the uterus. This is an abnormal finding for the patient's age. Consider further evaluation with pelvic ultrasound.  Electronically Signed   By: Rolm Baptise M.D.   On: 07/27/2018 11:16   Dg Abd 2 Views  Result Date: 07/27/2018 CLINICAL DATA:  73 year old female with epigastric pain, nausea and vomiting EXAM: ABDOMEN - 2 VIEW COMPARISON:  Prior CT scan of the chest abdomen and pelvis 06/11/2017 FINDINGS: The bowel gas pattern is normal. There  is no evidence of free air. No radio-opaque calculi or other significant radiographic abnormality is seen. Atherosclerotic calcifications are visualized in the transverse aorta. Calcified granuloma in the right middle lobe again noted. IMPRESSION: Negative. Electronically Signed   By: Jacqulynn Cadet M.D.   On: 07/27/2018 10:29   US Abdomen Limited Ruq  Result Date: 07/27/2018 CLINICAL DATA:  Right upper quadrant pain EXAM: ULTRASOUND ABDOMEN LIMITED RIGHT UPPER QUADRANT COMPARISON:  CT 06/11/2017 FINDINGS: Gallbladder: 1.5 cm gallstone within the gallbladder. Small amount of sludge. No wall thickening or sonographic Murphy sign. Common bile duct: Diameter: Normal caliber, 2 mm Liver: Area of increased echotexture within the left hepatic lobe may reflect focal fatty infiltration. No focal abnormality otherwise. No biliary ductal dilatation. Portal vein is patent on color Doppler imaging with normal direction of blood flow towards the liver. IMPRESSION: Cholelithiasis.  No sonographic evidence of acute cholecystitis. Suspect area of focal fatty infiltration in the left hepatic lobe. Electronically Signed   By: Rolm Baptise M.D.   On: 07/27/2018 10:09      ____________________________________________   PROCEDURES  Procedure(s) performed: None Procedures Critical Care performed:  None ____________________________________________   INITIAL IMPRESSION / ASSESSMENT AND PLAN / ED COURSE  73 y.o. female with history as listed below including peptic ulcer disease, GERD, gastritis who presents for evaluation of epigastric/ RUQ abdominal pain, nausea, and vomiting since this am.  Ddx gb disease, pancreatitis, PUD, gastritis, GERD, SBO. Less likely ACS with pain reproducible on palpation, normal EKG.  However will stratify further with troponin.  Labs are pending.  Will do right upper quadrant ultrasound and KUB.  Will give IV Pepcid, Zofran, fentanyl for symptoms.   Clinical Course as of Jul 26 1300  Wed Jul 27, 2018  1045 Labs with no acute findings.  Right upper quadrant ultrasound showing cholelithiasis with no evidence of cholecystitis.  KUB is within normal limits.  Patient continues to complain of significant pain and has significant tenderness to palpation in the epigastric and right upper quadrant.  We will do a CT to rule out any possible alternative etiologies such as SBO, dissection, colitis, diverticulitis, appendicitis.  Patient has not been able to take her antihypertensives this morning.  Her blood pressure is 207/71.  Since she is very nauseous I will give her IV hydralazine and hold p.o. meds at this time.   [CV]  1123 CT showing no other alternative source of patient's pain. Patient remains very tender on epigastric and RUQ areas. Will consult surgery.  CT showing thickened endometrium, patient has no lower abdominal tenderness. This will need outpatient follow up.    [CV]  7564 Patient was evaluated by Dr. Celine Ahr from surgery who recommended outpatient follow-up if patient's pain and nausea were well controlled.  After receiving a dose of Toradol patient's pain has now resolved.  She has had crackers, peanut butter, ginger ale and her morning meds without any further episodes of vomiting.  I will discharge her home with a prescription for Zofran.  Recommended staying away from fatty foods and having more of a bland diet for the next few days.  Recommended follow-up with surgery as an outpatient.  Discussed strict return precautions for recurrence of the pain, fever, or recurrence of the vomiting.  We will also refer her to OB/GYN for the thickened endometrium  evaluation.   [CV]    Clinical Course User Index [CV] Alfred Levins [place], MD     As part of my medical decision making, I reviewed the  following data within the [place] notes reviewed and incorporated, Labs reviewed , EKG interpreted , Old EKG reviewed, Old chart reviewed, Radiograph reviewed , A consult was requested and obtained from this/these consultant(s) Surgery, Notes from prior ED visits and [place] Controlled Substance Database    Pertinent labs & imaging results that were available during my care of the patient were reviewed by me and considered in my medical decision making (see chart for details).    ____________________________________________   FINAL CLINICAL IMPRESSION(S) / ED DIAGNOSES  Final diagnoses:  RUQ abdominal pain  Abdominal pain  Calculus of gallbladder without cholecystitis without obstruction  Thickened endometrium      NEW MEDICATIONS STARTED DURING THIS VISIT:  ED Discharge Orders         Ordered    ondansetron (ZOFRAN ODT) 4 MG disintegrating tablet  Every 8 hours PRN     07/27/18 1259           Note:  This document was prepared using Dragon voice recognition software and may include unintentional dictation errors.    Alfred Levins, [place], MD 07/27/18 1302

## 2018-07-27 NOTE — ED Notes (Signed)
Patient transported to CT 

## 2018-07-27 NOTE — ED Notes (Signed)
Pt given PB and crackers with water to attempt to PO challenge. Pt drank some water with her pills and vomitted some light green vomit with no pills at this time. Pt going to try some ginger ale.

## 2018-07-28 ENCOUNTER — Other Ambulatory Visit: Payer: Self-pay

## 2018-07-28 ENCOUNTER — Other Ambulatory Visit: Payer: Self-pay | Admitting: Hematology and Oncology

## 2018-07-28 ENCOUNTER — Inpatient Hospital Stay: Payer: PPO | Attending: Urgent Care

## 2018-07-28 ENCOUNTER — Inpatient Hospital Stay: Payer: PPO

## 2018-07-28 DIAGNOSIS — M8588 Other specified disorders of bone density and structure, other site: Secondary | ICD-10-CM

## 2018-07-28 DIAGNOSIS — C50412 Malignant neoplasm of upper-outer quadrant of left female breast: Secondary | ICD-10-CM

## 2018-07-28 DIAGNOSIS — C50912 Malignant neoplasm of unspecified site of left female breast: Secondary | ICD-10-CM | POA: Diagnosis not present

## 2018-07-28 DIAGNOSIS — Z170 Estrogen receptor positive status [ER+]: Secondary | ICD-10-CM | POA: Insufficient documentation

## 2018-07-28 DIAGNOSIS — R110 Nausea: Secondary | ICD-10-CM

## 2018-07-28 DIAGNOSIS — E876 Hypokalemia: Secondary | ICD-10-CM

## 2018-07-28 DIAGNOSIS — N184 Chronic kidney disease, stage 4 (severe): Secondary | ICD-10-CM | POA: Diagnosis not present

## 2018-07-28 DIAGNOSIS — N189 Chronic kidney disease, unspecified: Secondary | ICD-10-CM

## 2018-07-28 DIAGNOSIS — E538 Deficiency of other specified B group vitamins: Secondary | ICD-10-CM

## 2018-07-28 DIAGNOSIS — D631 Anemia in chronic kidney disease: Secondary | ICD-10-CM

## 2018-07-28 DIAGNOSIS — Z79899 Other long term (current) drug therapy: Secondary | ICD-10-CM | POA: Insufficient documentation

## 2018-07-28 DIAGNOSIS — M810 Age-related osteoporosis without current pathological fracture: Secondary | ICD-10-CM | POA: Insufficient documentation

## 2018-07-28 LAB — CBC
HCT: 32 % — ABNORMAL LOW (ref 36.0–46.0)
Hemoglobin: 11.1 g/dL — ABNORMAL LOW (ref 12.0–15.0)
MCH: 32.8 pg (ref 26.0–34.0)
MCHC: 34.7 g/dL (ref 30.0–36.0)
MCV: 94.7 fL (ref 80.0–100.0)
Platelets: 253 10*3/uL (ref 150–400)
RBC: 3.38 MIL/uL — ABNORMAL LOW (ref 3.87–5.11)
RDW: 14.6 % (ref 11.5–15.5)
WBC: 13.1 10*3/uL — ABNORMAL HIGH (ref 4.0–10.5)
nRBC: 0 % (ref 0.0–0.2)

## 2018-08-01 ENCOUNTER — Other Ambulatory Visit: Payer: Self-pay | Admitting: *Deleted

## 2018-08-01 NOTE — Patient Outreach (Signed)
[facility]) Care Management  08/01/2018  Bianca Shaw [birth year] [id number]   Nurse call center follow up call for 07/27/18 and 07/29/18  Referral Date: 08/01/18 Referral Source:  Nurse call center Referral Reason:  07/29/18 Mr Mcclory called to state Bianca Shaw had a gall bladder attack, went to the hospital, back home, soreness in her stomach and low grad temp 99, BP dropped for her and having dizziness 124/57, normally runs 150s/80s - RN W Jimmye [facility] recommended Fish farm manager on call services r/t Bianca Shaw s/s or RN could make recommendations. Mr Galeno decided to call the surgeon office  Insurance: HTA    Outreach attempt # 1 successful  Patient is able to verify HIPAA Reviewed and addressed referral to [facility] with patient  Bianca Shaw reports she feels better than on 07/29/18 except she still remains sore in her mid to upper right abdomen only.  She states her appetite is good but she is eating slower. She reports she was able to tolerate an open face tuna melt today. She reports not having emesis, any further fever and "my Blood pressure is holding" [facility] RN CM discussed eating high fiber foods, low fat or only good fats (fish oil/olive oil), foods without lots of spice and sugar and low carbohydrate foods. She voiced understanding and reports not eating any fried foods She reports being scheduled for a MD conference call scheduled Thursday 08/04/18 with the surgeon    She voices appreciation for RN CM follow up call   Social: Bianca Shaw lives at home with her husband [name] She is retired She is independent with her care needs She and Mr Thakur denies transportation concerns with getting to medical appointments   Conditions: Cholelithiasis/Gallstones without obstruction, thickened endometrium, GERD, Peptic ulcer, Left breast cancer with mastectomy in 2017,  HTN, NSTEMI, CAD, Chronic airflow limitation, seasonal allergic rhinitis osteoporosis,  anemia associated with chronic renal failure, anxiety, hypercholesteremia, tobacco use, dyspnea, low vitamin B12 level, scleroderma, bloodgood disease,   DME: none  Medications: denies concerns with taking medications as prescribed, affording medications, side effects of medications and questions about medications    Appointments:  f/u with Surgeon Dr Fredirick Maudlin Thursday 08/04/18 via telephone 08/11/18  [facility]  Advance Directives: Denies need for assist with advance directives  Denies need for assist with changes to present advance directives     Consent: [facility] RN CM reviewed [facility] services with patient. Patient gave verbal consent for services [facility] telephonic RN CM.   Plan: [facility] RN CM will close case at this time as patient has been assessed and no needs identified/needs resolved.   Pt encouraged to return a call to [facility] RN CM prn  Routed notes to MDs  Joelene Millin L. Lavina Hamman, RN, BSN, [place] Coordinator Office number [phone] Mobile number [phone]  Main [facility] number [phone] Fax number [phone]

## 2018-08-02 ENCOUNTER — Ambulatory Visit: Payer: Self-pay | Admitting: *Deleted

## 2018-08-03 ENCOUNTER — Other Ambulatory Visit: Payer: Self-pay

## 2018-08-03 ENCOUNTER — Ambulatory Visit (INDEPENDENT_AMBULATORY_CARE_PROVIDER_SITE_OTHER): Payer: PPO | Admitting: General Surgery

## 2018-08-03 DIAGNOSIS — K8020 Calculus of gallbladder without cholecystitis without obstruction: Secondary | ICD-10-CM | POA: Insufficient documentation

## 2018-08-03 NOTE — Progress Notes (Signed)
Virtual Visit via Telephone Note  I connected with Bianca Shaw on 08/03/18 at  9:45 AM EDT by telephone and verified that I am speaking with the correct person using two identifiers.   I discussed the limitations, risks, security and privacy concerns of performing an evaluation and management service by telephone and the availability of in person appointments. I also discussed with the patient that there may be a patient responsible charge related to this service. The patient expressed understanding and agreed to proceed.   History of Present Illness: Ms. Mario is a 73 year old woman whom I saw in the emergency department for symptomatic cholelithiasis.  She was able to tolerate an oral challenge and her pain was controlled with Toradol.  Due to the COVID-19 pandemic, the determination was made that she would not undergo surgery at that time, but we would follow-up with her today to see how she is doing, with plans for surgery in the future.   Observations/Objective: Ms. Pratt states that she still has some epigastric pain from time to time; this is worse after eating.  She feels like she is able to take adequate oral intake and is not getting dehydrated.  She has had some nausea after eating, but no vomiting.  She has not had any fevers or chills.  She reports that she has not noticed any scleral icterus or acholic stools.  No dark-colored urine either.  She also says that her blood pressure was lower than normal today, 113/61.  She did not take any of her blood pressure medications because of this.  Assessment and Plan: This is a 73 year old woman who presented to the emergency department with abdominal pain.  She was found to have symptomatic cholelithiasis without cholecystitis.  Due to the limitations on surgical interventions during the COVID-19 crisis, she was treated conservatively with pain medications and sent home.  Today, it sounds as though she is still having some  intermittent biliary colic.  She is only been taking Tylenol.  I suggested that since Toradol was effective in the emergency department, she should try some oral ibuprofen.  She agreed to do this.  I suggested that she contact her primary care provider regarding her blood pressure regimen, as she is on a number of medications but her blood pressure this morning suggests that perhaps her regimen needs to be adjusted.  Ms. Sammons and her husband had a number of questions today.  Specifically, they asked what some of the signs might be that would indicate she needs to come to the hospital again.  I discussed this with them and iterated that fevers, chills, nausea and vomiting, jaundice, acholic stools, dark tea colored urine, and pain that did not respond to conservative measures at home were all indications that she should present again to the emergency department for evaluation and management.  They asked why she could not simply have her gallbladder removed at this time to obviate the need for a hospital visit.  I discussed with them the current situation regarding elective operations.  They were notified that, as of now, all elective procedures within the [facility] system were deferred until after June 1.  I also related that the guidelines presented by the [employer] of surgeons, indicating that symptomatic cholelithiasis should be managed conservatively at this time.  Should she develop cholecystitis, the safer course of action is likely to place a percutaneous cholecystostomy tube.  They expressed understanding and agreed with the plan as outlined above.  Follow Up  Instructions: Barring further delays in elective operations secondary to the COVID-19 pandemic, I would like to see Ms. Tetreault in clinic in about 4 to 6 weeks' time to plan for an elective outpatient cholecystectomy.   I discussed the assessment and treatment plan with the patient. The patient was provided an opportunity to ask  questions and all were answered. The patient agreed with the plan and demonstrated an understanding of the instructions.   The patient was advised to call back or seek an in-person evaluation if the symptoms worsen or if the condition fails to improve as anticipated.  I provided 15 minutes of non-face-to-face time during this encounter.   Fredirick Maudlin, MD

## 2018-08-11 ENCOUNTER — Inpatient Hospital Stay: Payer: PPO | Attending: Urgent Care

## 2018-08-11 ENCOUNTER — Other Ambulatory Visit: Payer: Self-pay

## 2018-08-11 ENCOUNTER — Telehealth: Payer: Self-pay

## 2018-08-11 ENCOUNTER — Inpatient Hospital Stay: Payer: PPO

## 2018-08-11 VITALS — BP 136/57 | HR 59 | Temp 97.9°F | Resp 17

## 2018-08-11 DIAGNOSIS — N189 Chronic kidney disease, unspecified: Secondary | ICD-10-CM

## 2018-08-11 DIAGNOSIS — M810 Age-related osteoporosis without current pathological fracture: Secondary | ICD-10-CM | POA: Diagnosis not present

## 2018-08-11 DIAGNOSIS — Z170 Estrogen receptor positive status [ER+]: Secondary | ICD-10-CM | POA: Insufficient documentation

## 2018-08-11 DIAGNOSIS — C50912 Malignant neoplasm of unspecified site of left female breast: Secondary | ICD-10-CM | POA: Diagnosis not present

## 2018-08-11 DIAGNOSIS — Z79899 Other long term (current) drug therapy: Secondary | ICD-10-CM | POA: Insufficient documentation

## 2018-08-11 DIAGNOSIS — D631 Anemia in chronic kidney disease: Secondary | ICD-10-CM | POA: Diagnosis not present

## 2018-08-11 DIAGNOSIS — N184 Chronic kidney disease, stage 4 (severe): Secondary | ICD-10-CM | POA: Insufficient documentation

## 2018-08-11 DIAGNOSIS — M8588 Other specified disorders of bone density and structure, other site: Secondary | ICD-10-CM

## 2018-08-11 DIAGNOSIS — C50412 Malignant neoplasm of upper-outer quadrant of left female breast: Secondary | ICD-10-CM

## 2018-08-11 DIAGNOSIS — E538 Deficiency of other specified B group vitamins: Secondary | ICD-10-CM

## 2018-08-11 DIAGNOSIS — E876 Hypokalemia: Secondary | ICD-10-CM

## 2018-08-11 LAB — CBC
HCT: 29.2 % — ABNORMAL LOW (ref 36.0–46.0)
Hemoglobin: 9.8 g/dL — ABNORMAL LOW (ref 12.0–15.0)
MCH: 32.2 pg (ref 26.0–34.0)
MCHC: 33.6 g/dL (ref 30.0–36.0)
MCV: 96.1 fL (ref 80.0–100.0)
Platelets: 324 10*3/uL (ref 150–400)
RBC: 3.04 MIL/uL — ABNORMAL LOW (ref 3.87–5.11)
RDW: 14.4 % (ref 11.5–15.5)
WBC: 7.6 10*3/uL (ref 4.0–10.5)
nRBC: 0 % (ref 0.0–0.2)

## 2018-08-11 MED ORDER — EPOETIN ALFA 10000 UNIT/ML IJ SOLN
10000.0000 [IU] | Freq: Once | INTRAMUSCULAR | Status: AC
  Administered 2018-08-11: 10000 [IU] via SUBCUTANEOUS
  Filled 2018-08-11: qty 2

## 2018-08-11 NOTE — Telephone Encounter (Signed)
-----   Message from Lequita Asal, MD sent at 08/11/2018  1:21 PM EDT ----- Regarding: Please call patient  Blood counts show slight drop in hemoglobin.  Ask if any bleeding or concerns.  M ----- Message ----- From: Interface, [facility] In [place] Sent: 08/11/2018  10:10 AM EDT To: Lequita Asal, MD

## 2018-08-11 NOTE — Telephone Encounter (Signed)
Spoke with Ms Copleand to inform her that her Hbg has dropped from 11.1 to 9.8. The patient states she has not had any bleeding or any concerns . The patient states she has been fine. No bleeding at all. The patient was agreeable and understanding if any bleeding starts or feeling more weaker than normal. Please contact the office. The patient did come in and get injection today.

## 2018-08-22 ENCOUNTER — Other Ambulatory Visit: Payer: Self-pay

## 2018-08-22 ENCOUNTER — Telehealth: Payer: Self-pay

## 2018-08-22 MED ORDER — ONDANSETRON 4 MG PO TBDP: 4.0000 mg | ORAL_TABLET | Freq: Three times a day (TID) | ORAL | 1 refills | Status: DC | PRN

## 2018-08-22 NOTE — Telephone Encounter (Signed)
Patient called and wanted to know if she could get a refill of her anti nausea medication, Ondansetron. She is unable to have surgery at this time due to Covid restrictions. I let her know that I would ask Dr Celine Ahr about this.

## 2018-08-22 NOTE — Telephone Encounter (Signed)
Spoke with Dr Celine Ahr and approval given for refill of medication. Refill sent to [facility].

## 2018-08-24 ENCOUNTER — Other Ambulatory Visit: Payer: Self-pay

## 2018-08-25 ENCOUNTER — Inpatient Hospital Stay: Payer: PPO

## 2018-08-25 ENCOUNTER — Other Ambulatory Visit: Payer: Self-pay

## 2018-08-25 ENCOUNTER — Encounter: Admission: RE | Payer: Self-pay | Source: Home / Self Care

## 2018-08-25 ENCOUNTER — Ambulatory Visit: Admission: RE | Admit: 2018-08-25 | Payer: PPO | Source: Home / Self Care

## 2018-08-25 VITALS — BP 149/65 | HR 52 | Temp 97.0°F | Resp 18

## 2018-08-25 DIAGNOSIS — R7989 Other specified abnormal findings of blood chemistry: Secondary | ICD-10-CM

## 2018-08-25 DIAGNOSIS — C50412 Malignant neoplasm of upper-outer quadrant of left female breast: Secondary | ICD-10-CM

## 2018-08-25 DIAGNOSIS — E538 Deficiency of other specified B group vitamins: Secondary | ICD-10-CM

## 2018-08-25 DIAGNOSIS — N189 Chronic kidney disease, unspecified: Secondary | ICD-10-CM

## 2018-08-25 DIAGNOSIS — D631 Anemia in chronic kidney disease: Secondary | ICD-10-CM

## 2018-08-25 DIAGNOSIS — E876 Hypokalemia: Secondary | ICD-10-CM

## 2018-08-25 DIAGNOSIS — N184 Chronic kidney disease, stage 4 (severe): Secondary | ICD-10-CM | POA: Diagnosis not present

## 2018-08-25 DIAGNOSIS — M8588 Other specified disorders of bone density and structure, other site: Secondary | ICD-10-CM

## 2018-08-25 LAB — CBC
HCT: 28.7 % — ABNORMAL LOW (ref 36.0–46.0)
Hemoglobin: 9.6 g/dL — ABNORMAL LOW (ref 12.0–15.0)
MCH: 32.4 pg (ref 26.0–34.0)
MCHC: 33.4 g/dL (ref 30.0–36.0)
MCV: 97 fL (ref 80.0–100.0)
Platelets: 295 10*3/uL (ref 150–400)
RBC: 2.96 MIL/uL — ABNORMAL LOW (ref 3.87–5.11)
RDW: 14.3 % (ref 11.5–15.5)
WBC: 5.2 10*3/uL (ref 4.0–10.5)
nRBC: 0 % (ref 0.0–0.2)

## 2018-08-25 SURGERY — PHACOEMULSIFICATION, CATARACT, WITH IOL INSERTION
Anesthesia: Choice | Laterality: Left

## 2018-08-25 MED ORDER — EPOETIN ALFA 10000 UNIT/ML IJ SOLN
10000.0000 [IU] | Freq: Once | INTRAMUSCULAR | Status: AC
  Administered 2018-08-25: 10000 [IU] via SUBCUTANEOUS

## 2018-08-25 NOTE — Patient Instructions (Signed)
Epoetin Alfa injection °What is this medicine? °EPOETIN ALFA (e POE e tin AL fa) helps your body make more red blood cells. This medicine is used to treat anemia caused by chronic kidney disease, cancer chemotherapy, or HIV-therapy. It may also be used before surgery if you have anemia. °This medicine may be used for other purposes; ask your health care provider or pharmacist if you have questions. °COMMON BRAND NAME(S): Epogen, Procrit, Retacrit °What should I tell my health care provider before I take this medicine? °They need to know if you have any of these conditions: °-cancer °-heart disease °-high blood pressure °-history of blood clots °-history of stroke °-low levels of folate, iron, or vitamin B12 in the blood °-seizures °-an unusual or allergic reaction to erythropoietin, albumin, benzyl alcohol, hamster proteins, other medicines, foods, dyes, or preservatives °-pregnant or trying to get pregnant °-breast-feeding °How should I use this medicine? °This medicine is for injection into a vein or under the skin. It is usually given by a health care professional in a hospital or clinic setting. °If you get this medicine at home, you will be taught how to prepare and give this medicine. Use exactly as directed. Take your medicine at regular intervals. Do not take your medicine more often than directed. °It is important that you put your used needles and syringes in a special sharps container. Do not put them in a trash can. If you do not have a sharps container, call your pharmacist or healthcare provider to get one. °A special MedGuide will be given to you by the pharmacist with each prescription and refill. Be sure to read this information carefully each time. °Talk to your pediatrician regarding the use of this medicine in children. While this drug may be prescribed for selected conditions, precautions do apply. °Overdosage: If you think you have taken too much of this medicine contact a poison control center  or emergency room at once. °NOTE: This medicine is only for you. Do not share this medicine with others. °What if I miss a dose? °If you miss a dose, take it as soon as you can. If it is almost time for your next dose, take only that dose. Do not take double or extra doses. °What may interact with this medicine? °Interactions have not been studied. °This list may not describe all possible interactions. Give your health care provider a list of all the medicines, herbs, non-prescription drugs, or dietary supplements you use. Also tell them if you smoke, drink alcohol, or use illegal drugs. Some items may interact with your medicine. °What should I watch for while using this medicine? °Your condition will be monitored carefully while you are receiving this medicine. °You may need blood work done while you are taking this medicine. °This medicine may cause a decrease in vitamin B6. You should make sure that you get enough vitamin B6 while you are taking this medicine. Discuss the foods you eat and the vitamins you take with your health care professional. °What side effects may I notice from receiving this medicine? °Side effects that you should report to your doctor or health care professional as soon as possible: °-allergic reactions like skin rash, itching or hives, swelling of the face, lips, or tongue °-seizures °-signs and symptoms of a blood clot such as breathing problems; changes in vision; chest pain; severe, sudden headache; pain, swelling, warmth in the leg; trouble speaking; sudden numbness or weakness of the face, arm or leg °-signs and symptoms of a stroke   like changes in vision; confusion; trouble speaking or understanding; severe headaches; sudden numbness or weakness of the face, arm or leg; trouble walking; dizziness; loss of balance or coordination °Side effects that usually do not require medical attention (report to your doctor or health care professional if they continue or are  bothersome): °-chills °-cough °-dizziness °-fever °-headaches °-joint pain °-muscle cramps °-muscle pain °-nausea, vomiting °-pain, redness, or irritation at site where injected °This list may not describe all possible side effects. Call your doctor for medical advice about side effects. You may report side effects to FDA at 1-800-FDA-1088. °Where should I keep my medicine? °Keep out of the reach of children. °Store in a refrigerator between 2 and 8 degrees C (36 and 46 degrees F). Do not freeze or shake. Throw away any unused portion if using a single-dose vial. Multi-dose vials can be kept in the refrigerator for up to 21 days after the initial dose. Throw away unused medicine. °NOTE: This sheet is a summary. It may not cover all possible information. If you have questions about this medicine, talk to your doctor, pharmacist, or health care provider. °© 2019 [employer] (2016-11-20 08:35:19) ° °

## 2018-08-26 LAB — CANCER ANTIGEN 27.29: CA 27.29: 20.4 U/mL (ref 0.0–38.6)

## 2018-09-07 ENCOUNTER — Other Ambulatory Visit: Payer: Self-pay

## 2018-09-08 ENCOUNTER — Inpatient Hospital Stay: Payer: PPO

## 2018-09-08 ENCOUNTER — Inpatient Hospital Stay: Payer: PPO | Attending: Urgent Care

## 2018-09-08 ENCOUNTER — Other Ambulatory Visit: Payer: Self-pay

## 2018-09-08 DIAGNOSIS — D631 Anemia in chronic kidney disease: Secondary | ICD-10-CM | POA: Diagnosis not present

## 2018-09-08 DIAGNOSIS — M810 Age-related osteoporosis without current pathological fracture: Secondary | ICD-10-CM | POA: Diagnosis not present

## 2018-09-08 DIAGNOSIS — N189 Chronic kidney disease, unspecified: Secondary | ICD-10-CM

## 2018-09-08 DIAGNOSIS — C50912 Malignant neoplasm of unspecified site of left female breast: Secondary | ICD-10-CM | POA: Insufficient documentation

## 2018-09-08 DIAGNOSIS — Z79899 Other long term (current) drug therapy: Secondary | ICD-10-CM | POA: Insufficient documentation

## 2018-09-08 DIAGNOSIS — C50412 Malignant neoplasm of upper-outer quadrant of left female breast: Secondary | ICD-10-CM

## 2018-09-08 DIAGNOSIS — Z170 Estrogen receptor positive status [ER+]: Secondary | ICD-10-CM | POA: Insufficient documentation

## 2018-09-08 DIAGNOSIS — N184 Chronic kidney disease, stage 4 (severe): Secondary | ICD-10-CM | POA: Diagnosis not present

## 2018-09-08 LAB — CBC WITH DIFFERENTIAL/PLATELET
Abs Immature Granulocytes: 0.02 10*3/uL (ref 0.00–0.07)
Basophils Absolute: 0.1 10*3/uL (ref 0.0–0.1)
Basophils Relative: 1 %
Eosinophils Absolute: 0.2 10*3/uL (ref 0.0–0.5)
Eosinophils Relative: 4 %
HCT: 30.7 % — ABNORMAL LOW (ref 36.0–46.0)
Hemoglobin: 10.1 g/dL — ABNORMAL LOW (ref 12.0–15.0)
Immature Granulocytes: 0 %
Lymphocytes Relative: 19 %
Lymphs Abs: 1 10*3/uL (ref 0.7–4.0)
MCH: 31.9 pg (ref 26.0–34.0)
MCHC: 32.9 g/dL (ref 30.0–36.0)
MCV: 96.8 fL (ref 80.0–100.0)
Monocytes Absolute: 0.7 10*3/uL (ref 0.1–1.0)
Monocytes Relative: 14 %
Neutro Abs: 3.3 10*3/uL (ref 1.7–7.7)
Neutrophils Relative %: 62 %
Platelets: 259 10*3/uL (ref 150–400)
RBC: 3.17 MIL/uL — ABNORMAL LOW (ref 3.87–5.11)
RDW: 14.8 % (ref 11.5–15.5)
WBC: 5.4 10*3/uL (ref 4.0–10.5)
nRBC: 0 % (ref 0.0–0.2)

## 2018-09-21 ENCOUNTER — Ambulatory Visit: Payer: PPO | Admitting: General Surgery

## 2018-09-22 ENCOUNTER — Other Ambulatory Visit: Payer: Self-pay

## 2018-09-22 ENCOUNTER — Inpatient Hospital Stay: Payer: PPO

## 2018-09-22 VITALS — BP 152/70 | HR 52 | Temp 97.6°F | Resp 18

## 2018-09-22 DIAGNOSIS — N189 Chronic kidney disease, unspecified: Secondary | ICD-10-CM

## 2018-09-22 DIAGNOSIS — D631 Anemia in chronic kidney disease: Secondary | ICD-10-CM

## 2018-09-22 DIAGNOSIS — C50412 Malignant neoplasm of upper-outer quadrant of left female breast: Secondary | ICD-10-CM

## 2018-09-22 DIAGNOSIS — N184 Chronic kidney disease, stage 4 (severe): Secondary | ICD-10-CM | POA: Diagnosis not present

## 2018-09-22 LAB — CBC WITH DIFFERENTIAL/PLATELET
Abs Immature Granulocytes: 0.02 10*3/uL (ref 0.00–0.07)
Basophils Absolute: 0.1 10*3/uL (ref 0.0–0.1)
Basophils Relative: 1 %
Eosinophils Absolute: 0.2 10*3/uL (ref 0.0–0.5)
Eosinophils Relative: 4 %
HCT: 28.8 % — ABNORMAL LOW (ref 36.0–46.0)
Hemoglobin: 9.9 g/dL — ABNORMAL LOW (ref 12.0–15.0)
Immature Granulocytes: 0 %
Lymphocytes Relative: 18 %
Lymphs Abs: 1 10*3/uL (ref 0.7–4.0)
MCH: 33.1 pg (ref 26.0–34.0)
MCHC: 34.4 g/dL (ref 30.0–36.0)
MCV: 96.3 fL (ref 80.0–100.0)
Monocytes Absolute: 0.9 10*3/uL (ref 0.1–1.0)
Monocytes Relative: 16 %
Neutro Abs: 3.4 10*3/uL (ref 1.7–7.7)
Neutrophils Relative %: 61 %
Platelets: 240 10*3/uL (ref 150–400)
RBC: 2.99 MIL/uL — ABNORMAL LOW (ref 3.87–5.11)
RDW: 14.2 % (ref 11.5–15.5)
WBC: 5.7 10*3/uL (ref 4.0–10.5)
nRBC: 0 % (ref 0.0–0.2)

## 2018-09-22 MED ORDER — EPOETIN ALFA 10000 UNIT/ML IJ SOLN
10000.0000 [IU] | Freq: Once | INTRAMUSCULAR | Status: AC
  Administered 2018-09-22: 10000 [IU] via SUBCUTANEOUS

## 2018-09-22 NOTE — Patient Instructions (Signed)
Epoetin Alfa injection °What is this medicine? °EPOETIN ALFA (e POE e tin AL fa) helps your body make more red blood cells. This medicine is used to treat anemia caused by chronic kidney disease, cancer chemotherapy, or HIV-therapy. It may also be used before surgery if you have anemia. °This medicine may be used for other purposes; ask your health care provider or pharmacist if you have questions. °COMMON BRAND NAME(S): Epogen, Procrit, Retacrit °What should I tell my health care provider before I take this medicine? °They need to know if you have any of these conditions: °-cancer °-heart disease °-high blood pressure °-history of blood clots °-history of stroke °-low levels of folate, iron, or vitamin B12 in the blood °-seizures °-an unusual or allergic reaction to erythropoietin, albumin, benzyl alcohol, hamster proteins, other medicines, foods, dyes, or preservatives °-pregnant or trying to get pregnant °-breast-feeding °How should I use this medicine? °This medicine is for injection into a vein or under the skin. It is usually given by a health care professional in a hospital or clinic setting. °If you get this medicine at home, you will be taught how to prepare and give this medicine. Use exactly as directed. Take your medicine at regular intervals. Do not take your medicine more often than directed. °It is important that you put your used needles and syringes in a special sharps container. Do not put them in a trash can. If you do not have a sharps container, call your pharmacist or healthcare provider to get one. °A special MedGuide will be given to you by the pharmacist with each prescription and refill. Be sure to read this information carefully each time. °Talk to your pediatrician regarding the use of this medicine in children. While this drug may be prescribed for selected conditions, precautions do apply. °Overdosage: If you think you have taken too much of this medicine contact a poison control center  or emergency room at once. °NOTE: This medicine is only for you. Do not share this medicine with others. °What if I miss a dose? °If you miss a dose, take it as soon as you can. If it is almost time for your next dose, take only that dose. Do not take double or extra doses. °What may interact with this medicine? °Interactions have not been studied. °This list may not describe all possible interactions. Give your health care provider a list of all the medicines, herbs, non-prescription drugs, or dietary supplements you use. Also tell them if you smoke, drink alcohol, or use illegal drugs. Some items may interact with your medicine. °What should I watch for while using this medicine? °Your condition will be monitored carefully while you are receiving this medicine. °You may need blood work done while you are taking this medicine. °This medicine may cause a decrease in vitamin B6. You should make sure that you get enough vitamin B6 while you are taking this medicine. Discuss the foods you eat and the vitamins you take with your health care professional. °What side effects may I notice from receiving this medicine? °Side effects that you should report to your doctor or health care professional as soon as possible: °-allergic reactions like skin rash, itching or hives, swelling of the face, lips, or tongue °-seizures °-signs and symptoms of a blood clot such as breathing problems; changes in vision; chest pain; severe, sudden headache; pain, swelling, warmth in the leg; trouble speaking; sudden numbness or weakness of the face, arm or leg °-signs and symptoms of a stroke   like changes in vision; confusion; trouble speaking or understanding; severe headaches; sudden numbness or weakness of the face, arm or leg; trouble walking; dizziness; loss of balance or coordination °Side effects that usually do not require medical attention (report to your doctor or health care professional if they continue or are  bothersome): °-chills °-cough °-dizziness °-fever °-headaches °-joint pain °-muscle cramps °-muscle pain °-nausea, vomiting °-pain, redness, or irritation at site where injected °This list may not describe all possible side effects. Call your doctor for medical advice about side effects. You may report side effects to FDA at 1-800-FDA-1088. °Where should I keep my medicine? °Keep out of the reach of children. °Store in a refrigerator between 2 and 8 degrees C (36 and 46 degrees F). Do not freeze or shake. Throw away any unused portion if using a single-dose vial. Multi-dose vials can be kept in the refrigerator for up to 21 days after the initial dose. Throw away unused medicine. °NOTE: This sheet is a summary. It may not cover all possible information. If you have questions about this medicine, talk to your doctor, pharmacist, or health care provider. °© 2019 [employer] (2016-11-20 08:35:19) ° °

## 2018-10-03 DIAGNOSIS — Z4432 Encounter for fitting and adjustment of external left breast prosthesis: Secondary | ICD-10-CM | POA: Diagnosis not present

## 2018-10-03 DIAGNOSIS — C50112 Malignant neoplasm of central portion of left female breast: Secondary | ICD-10-CM | POA: Diagnosis not present

## 2018-10-11 NOTE — Progress Notes (Signed)
[facility]  [address], [address] [place], [place] [address] Phone: [phone]  Fax: [phone]   Clinic Day:  10/12/2018  Referring physician: Sofie Hartigan, MD  Chief Complaint: Bianca Shaw is a 73 y.o. female with stage IIA left breast cancer, anemia of chronic renal disease, and electrolytes abnormalities (hyponatremia and hypercalcemia) who is seen for 4 month assessment on tamoxifen.   HPI: The patient was last seen in the medical oncology clinic on 06/16/2018. At that time, she felt "real good". Exam was stable. She continued on tamoxifen.   She was seen in the [facility] ED for abdominal pain on 07/27/2018. EKG showed sinus bradycardia, rate of 58, first-degree AV block, normal QTC, normal axis, no ST elevations or depressions.  Abdomen and pelvis CT revealed cholelithiasis. There was no sonographic evidence of acute cholecystitis. Suspected area of focal fatty infiltration in the left hepatic lobe. Surgical intervention was not recommended due to COVID-19 precautions. She was discharged with antiemetics and pain medication.   She was seen by Dr. Fredirick Maudlin on 08/03/2018. Cholecystectomy was scheduled for 4-6 weeks, but canceled due to COVID-19 restrictions.   She received Procrit on 08/11/2018, 08/25/2018, and 09/22/2018.   Labs followed: 06/30/2018: hemoglobin 10.4, hematocrit 30.8.  07/14/2018: hemoglobin 10.7, hematocrit 32.5. 07/27/2018: hemoglobin 10.7, hematocrit 31.8. 07/28/2018: hemoglobin 11.1, hematocrit 32.0. 08/11/2018: hemoglobin 9.8, hematocrit 29.2. 08/25/2018: hemoglobin 9.6, hematocrit 28.7. 09/08/2018: hemoglobin 10.1, hematocrit 30.7. 09/22/2018: hemoglobin 9.9, hematocrit 28.8.  During the interim, she has felt "really good." She denies any fevers, sweats, or headaches. She denies any abdominal pain. She denies any blood in her stools, black stools, or blood in her urine.   She has monitored her hydration. Salt  tablets make her sick, so she has increased dietary salt intake.   She postponed her cholecystectomy, as her symptoms have resolved. She denies any side effects following Procrit injections. She continues on her B-12 supplement, which she notes gives her a lot of energy.    Past Medical History:  Diagnosis Date  . Anemia   . Anxiety disorder   . Breast cancer of upper-outer quadrant of left female breast ([place]) 11/2015   pT2 pN0(i+).;ER+; PR +, her 2 neu not overexpressed.  Mastectomy, SLN, Mammoprint: Low risk.   . Cancer ([place]) 12/03/2015   left breast/ INVASIVE LOBULAR CARCINOMA.   . Chronic kidney disease   . Cough    lingering, mild, finished Prednisone and anitbiotic 11/03/15  . Family history of adverse reaction to anesthesia    sister - PONV  . GERD (gastroesophageal reflux disease)   . Hypertension   . Osteopenia   . Osteoporosis   . Pericarditis    diagnonsed June, 2010, unclear etiology as of yer  . Personal history of tobacco use, presenting hazards to health 10/31/2015  . Scleroderma ([facility])    ONLY ON SKIN-MILD  . UTI (lower urinary tract infection)   . Wears dentures    full upper    Past Surgical History:  Procedure Laterality Date  . BREAST BIOPSY Right 2012   core - neg  . BREAST BIOPSY Left 12/03/2015   INVASIVE LOBULAR CARCINOMA.   Marland Kitchen CARDIAC CATHETERIZATION    . CATARACT EXTRACTION W/ INTRAOCULAR LENS IMPLANT Right   . COLONOSCOPY WITH PROPOFOL N/A 11/08/2015   Procedure: COLONOSCOPY WITH PROPOFOL;  Surgeon: Lucilla Lame, MD;  Location: [facility];  Service: Endoscopy;  Laterality: N/A;  . ESOPHAGOGASTRODUODENOSCOPY (EGD) WITH PROPOFOL N/A 11/18/2017   Procedure: ESOPHAGOGASTRODUODENOSCOPY (EGD)  WITH PROPOFOL;  Surgeon: Virgel Manifold, MD;  Location: [facility] ENDOSCOPY;  Service: Endoscopy;  Laterality: N/A;  . ESOPHAGOGASTRODUODENOSCOPY (EGD) WITH PROPOFOL N/A 02/15/2018   Procedure: ESOPHAGOGASTRODUODENOSCOPY (EGD) WITH PROPOFOL;  Surgeon: Jonathon Bellows, MD;  Location: [facility] ENDOSCOPY;  Service: Gastroenterology;  Laterality: N/A;  . ESOPHAGOGASTRODUODENOSCOPY (EGD) WITH PROPOFOL N/A 05/10/2018   Procedure: ESOPHAGOGASTRODUODENOSCOPY (EGD) WITH BIOPSIES;  Surgeon: Virgel Manifold, MD;  Location: [facility];  Service: Endoscopy;  Laterality: N/A;  . EVACUATION BREAST HEMATOMA Left 01/14/2016   Procedure: EVACUATION HEMATOMA BREAST;  Surgeon: Robert Bellow, MD;  Location: [facility] ORS;  Service: General;  Laterality: Left;  . EYE SURGERY    . MASTECTOMY Left 2017   complete mastectomy  . MASTECTOMY W/ SENTINEL NODE BIOPSY Left 12/26/2015   Procedure: MASTECTOMY WITH SENTINEL LYMPH NODE BIOPSY;  Surgeon: Robert Bellow, MD;  Location: [facility] ORS;  Service: General;  Laterality: Left;  . RIGHT/LEFT HEART CATH AND CORONARY ANGIOGRAPHY N/A 07/22/2016   Procedure: Right/Left Heart Cath and Coronary Angiography;  Surgeon: Minna Merritts, MD;  Location: [facility];  Service: Cardiovascular;  Laterality: N/A;  . TUBAL LIGATION    . VESICOVAGINAL FISTULA CLOSURE W/ TAH      Family History  Problem Relation Age of Onset  . Heart failure Mother   . Epilepsy Mother   . COPD Father   . Heart disease Father   . Anxiety disorder Sister   . Arthritis Brother   . Heart disease Brother   . Vaginal cancer Paternal Grandmother   . Heart attack Paternal Grandfather   . COPD Brother   . Kidney failure Brother   . COPD Brother   . Arthritis Sister   . Uterine cancer Other   . Diabetes Other   . Colon cancer Neg Hx   . Stomach cancer Neg Hx   . Breast cancer Neg Hx     Social History:  reports that she quit smoking about 14 years ago. Her smoking use included cigarettes. She has a 30.00 pack-year smoking history. She has never used smokeless tobacco. She reports current alcohol use of about 1.0 - 3.0 standard drinks of alcohol per week. She reports that she does not use drugs. She has 5 brothers and 2 sisters.  She has 2  children who are alive and well.  She lives in [place] with her husband, Pat Patrick.  She is alone today.  Allergies:  Allergies  Allergen Reactions  . Pimenta Nausea And Vomiting  . Tomato Rash    Current Medications: Current Outpatient Medications  Medication Sig Dispense Refill  . amLODipine (NORVASC) 2.5 MG tablet Take 1 tablet (2.5 mg total) by mouth daily. 30 tablet 0  . fluticasone (FLONASE) 50 MCG/ACT nasal spray Place 2 sprays into both nostrils daily. 16 g 0  . furosemide (LASIX) 20 MG tablet Take 20 mg by mouth daily.     . hydrALAZINE (APRESOLINE) 25 MG tablet Take 1 tablet (25 mg total) by mouth 2 (two) times daily. 60 tablet 11  . lisinopril (PRINIVIL,ZESTRIL) 20 MG tablet Take 1 tablet (20 mg total) by mouth daily. 30 tablet 2  . metoprolol succinate (TOPROL-XL) 25 MG 24 hr tablet Take 1 tablet (25 mg total) by mouth daily. 90 tablet 3  . potassium chloride SA (K-DUR,KLOR-CON) 20 MEQ tablet Take 1 tablet (20 mEq total) by mouth daily. (Patient taking differently: Take 20 mEq by mouth 2 (two) times daily. ) 90 tablet 2  . simvastatin (  ZOCOR) 40 MG tablet Take 40 mg by mouth daily.    . tamoxifen (NOLVADEX) 20 MG tablet Take 1 tablet (20 mg total) by mouth daily. 90 tablet 0  . albuterol (PROVENTIL) (2.5 MG/3ML) 0.083% nebulizer solution Take 3 mLs (2.5 mg total) by nebulization every 6 (six) hours as needed for wheezing or shortness of breath. (Patient not taking: Reported on 10/12/2018) 75 mL 12  . nitroGLYCERIN (NITROSTAT) 0.4 MG SL tablet Place 1 tablet (0.4 mg total) under the tongue every 5 (five) minutes as needed for chest pain. (Patient not taking: Reported on 10/12/2018) 30 tablet 0  . ondansetron (ZOFRAN ODT) 4 MG disintegrating tablet Take 1 tablet (4 mg total) by mouth every 8 (eight) hours as needed. (Patient not taking: Reported on 10/12/2018) 20 tablet 1   No current facility-administered medications for this visit.     Review of Systems  Constitutional: Negative.   Negative for chills, diaphoresis, fever, malaise/fatigue and weight loss (up 6 pounds).       Feels "real good".  HENT: Negative.  Negative for congestion, ear pain, nosebleeds, sinus pain, sore throat and tinnitus.   Eyes: Positive for blurred vision (cataract in left eye). Negative for photophobia and pain.  Respiratory: Negative.  Negative for hemoptysis, sputum production and shortness of breath.   Cardiovascular: Negative.  Negative for chest pain, palpitations, orthopnea, leg swelling and PND.  Gastrointestinal: Negative for abdominal pain, blood in stool, constipation, diarrhea, heartburn, melena, nausea and vomiting.       History of duodenal ulcer. Last colonoscopy 2017, negative.  Genitourinary: Negative.  Negative for dysuria, frequency, hematuria and urgency.  Musculoskeletal: Negative.  Negative for back pain, falls, joint pain, myalgias and neck pain.       Osteoporosis.  Skin: Negative.  Negative for itching and rash.       Scleroderma.  Neurological: Negative.  Negative for dizziness, tremors, sensory change, speech change, focal weakness, weakness and headaches.  Endo/Heme/Allergies: Negative.  Does not bruise/bleed easily.  Psychiatric/Behavioral: Positive for memory loss. Negative for depression. The patient is not nervous/anxious and does not have insomnia.   All other systems reviewed and are negative.  Performance status (ECOG): 0  Vitals: Blood pressure (!) 161/78, pulse 78, temperature 97.9 F (36.6 C), resp. rate 18, height _0  (1.549 m), weight 137 lb 3.8 oz (62.2 kg), SpO2 100 %.  Physical Exam  Constitutional: She is oriented to person, place, and time. She appears well-developed and well-nourished. No distress.  HENT:  Head: Normocephalic and atraumatic.  Mouth/Throat: Oropharynx is clear and moist. No oropharyngeal exudate.  Short brown graying hair. Mask.  Eyes: Pupils are equal, round, and reactive to light. Conjunctivae and EOM are normal. No scleral  icterus.  Blue eyes.  Neck: Normal range of motion. Neck supple.  Cardiovascular: Normal rate, regular rhythm and normal heart sounds.  No murmur heard. Pulmonary/Chest: Effort normal and breath sounds normal. No respiratory distress. She has no wheezes. Right breast exhibits no inverted nipple, no mass, no nipple discharge and no tenderness. Skin change: fibrocystic changes 12 o'clock and 5 o'clock. Left breast exhibits no inverted nipple, no mass, no nipple discharge, no skin change and no tenderness. Breast asymmetry: left mastectomy.  Abdominal: Soft. Bowel sounds are normal. She exhibits no distension. There is no abdominal tenderness.  Musculoskeletal: Normal range of motion.        General: Edema (trace lower extremity) present.  Lymphadenopathy:    She has no cervical adenopathy.    She  has no axillary adenopathy.       Right: No supraclavicular adenopathy present.       Left: No supraclavicular adenopathy present.  Neurological: She is alert and oriented to person, place, and time.  Skin: Skin is warm and dry. She is not diaphoretic.  Psychiatric: She has a normal mood and affect. Her behavior is normal. Judgment and thought content normal.  Nursing note and vitals reviewed.   Imaging studies: 11/21/2015:  Left mammogram and ultrasound revealed a 4.2 x 2.4 x 2.6 cm irregular hypoechoic mass in the 1-2 o'clock position in the left breast 3 cm from the nipple.  Left axillary lymph nodes appeared normal.   12/25/2015:  Breast MRI revealed a 6.2 cm area of abnormal enhancement in the upper outer quadrant of the left breast.  The right breast revealed no mass or abnormal enhancement. 12/08/2016:  Right mammogram revealed no evidence of malignancy. 06/04/2017:  Chest CT revealed no pneumonia, edema, collapse or effusion. There was a right middle lobe calcified granuloma.There was a stable 8 mm scar posterior aspect of the right upper lobe. 06/11/2017:  Abdomen and pelvic CT revealed no  acute process in the abdomen or pelvis. There was possible bladder wall thickening (? under distention). There was nonspecific presacral and posterior pelvic edema. There was central uterine hypoattenuation (subtle). 06/16/2017:  Head MRI without contrast revealed no acute intracranial abnormality. There was wide spread changes c/w advanced small vessel disease.  06/22/2017:  Bone survey revealed no focal lytic or sclerotic lesion. 08/23/2017: RIGHT diagnostic mammogram and breast ultrasound on 08/23/2017 revealed no mammographic or sonographic evidence of malignancy in the RIGHT breast. Patient has heterogeneously dense breast tissue (ACR  breast density category C).    Appointment on 10/12/2018  Component Date Value Ref Range Status  . WBC 10/12/2018 4.8  4.0 - 10.5 K/uL Final  . RBC 10/12/2018 2.99* 3.87 - 5.11 MIL/uL Final  . Hemoglobin 10/12/2018 9.9* 12.0 - 15.0 g/dL Final  . HCT 10/12/2018 28.7* 36.0 - 46.0 % Final  . MCV 10/12/2018 96.0  80.0 - 100.0 fL Final  . MCH 10/12/2018 33.1  26.0 - 34.0 pg Final  . MCHC 10/12/2018 34.5  30.0 - 36.0 g/dL Final  . RDW 10/12/2018 14.3  11.5 - 15.5 % Final  . Platelets 10/12/2018 245  150 - 400 K/uL Final  . nRBC 10/12/2018 0.0  0.0 - 0.2 % Final  . Neutrophils Relative % 10/12/2018 61  % Final  . Neutro Abs 10/12/2018 3.0  1.7 - 7.7 K/uL Final  . Lymphocytes Relative 10/12/2018 19  % Final  . Lymphs Abs 10/12/2018 0.9  0.7 - 4.0 K/uL Final  . Monocytes Relative 10/12/2018 15  % Final  . Monocytes Absolute 10/12/2018 0.7  0.1 - 1.0 K/uL Final  . Eosinophils Relative 10/12/2018 4  % Final  . Eosinophils Absolute 10/12/2018 0.2  0.0 - 0.5 K/uL Final  . Basophils Relative 10/12/2018 1  % Final  . Basophils Absolute 10/12/2018 0.1  0.0 - 0.1 K/uL Final  . Immature Granulocytes 10/12/2018 0  % Final  . Abs Immature Granulocytes 10/12/2018 0.02  0.00 - 0.07 K/uL Final   Performed at [facility], [address] [place],  [place] [address]    Assessment:  Bianca Shaw is a 73 y.o. female with stage IIA (T2N0) left breast cancer s/p mastectomy with sentinel lymph node biopsy on 12/26/2015.   Pathology revealed a 4.2 cm grade I invasive lobular carcinoma with  scattered microcalcifications.  Margins were negative.  Two sentinel lymph nodes were negative for macrometastasis, but with isolated tumor cells on IHC stains.  Three additional lymph nodes were positive for isolated tumor cells on IHC.  Tumor was ER positive (> 90%), PR positive (> 90%), and Her2/neu 2+ (eqivocal).  Her2/neu by FISH was negative.  Pathologic stage was pT2 pN0(i+).  MammaPrint testing revealed low risk luminal type A. There was a 97.8% probability of being disease free at 10 years with hormonal therapy.   Right mammogram on 12/08/2016 revealed no evidence of malignancy. RIGHT diagnostic mammogram and breast ultrasound on 08/23/2017 revealed no mammographic or sonographic evidence of malignancy in the RIGHT breast. Patient has heterogeneously dense breast tissue (ACR  breast density category C).   Exam in 11/2016 revealed a 4 mm nodule in the left axillae above the mastectomy incision.  Excision biopsy on 12/15/2016 revealed fat necrosis and no malignancy.  Imaging studies (chest, abdomen, and pelvic CT, head MRI, and bone survey) in 05/2017 revealed no evidence of metastatic disease.  She began tamoxifen on 02/04/2016.  She is tolerating it well.  CA27.29 has been followed: 27.3 on 12/10/2015, 19.4 on 05/30/2016, 16.4 on 09/07/2016, 17.2 on 12/08/2016, 21.3 on 04/09/2017, 17.6 on 08/12/2017, 18.8 on 12/14/2017, 17.2 on 06/16/2018, and 17.1 on 10/12/2018.  Bone density on 11/05/2015 revealed osteoporosis with a T score of -2.9 in the AP spine L1-L2 and -1.9 in the left femoral neck.  Bone density on 11/08/2017 revealed osteopenia with a T-score of -2.0  in the AP spine and -1.8 in the LEFT femoral neck. She started Fosamax in 10/2015.  She is  on calcium and vitamin D.  She had a colonoscopy in 09/2015.  She denies any melena, hematochezia, hematuria or vaginal bleeding.  She began oral B12 in early 03/2017.  B12 was 295 on 06/09/2016, 299 on 12/08/2016, 1585 on 05/24/2017, 212 on 03/08/2018, and 354 on 06/16/2018.  Folate was 11.5 on 04/09/2017 and 9.0 on 03/08/2018.  EGD on 11/18/2017 revealed 3 to 4 non-bleeding ulcers, with the largest measuring 8 mm. There was no centralized bleeding, however there was mild oozing noted at the ulcer edges. Pathology revealed active gastritis with no evidence of high grade dysplasia or malignancy. IHC testing (+) for H. pylori.   EGD on 02/15/2018 that demonstrated a single non-bleeding duodenal ulcer measuring 6 mm in largest dimension.  There were mucosal changes noted in the gastric antrum.  Pathology revealed chronic active gastritis.  There was villous atrophy with marked active inflammation noted in the specimen taken from the duodenal bulb.  Nodular mucosa of the gastric body revealed one tiny fragment with intestinal metaplasia.  EGD on 05/10/2018 by revealed a Z-line irregular. There was erythematous mucosa in the antrum. There was a normal duodenal bulb, second portion of the duodenum and examined duodenum. Additional biopsies were obtained on the greater curvature of the gastric body, on the lesser curvature of the gastric body, at the incisura, on the greater curvature of the gastric antrum and on the lesser curvature of the gastric antrum.  Gastric mapping biopsies did not show any dysplasia.  EGD with biopsies was recommended in 2 years to follow these changes over time.   She has a normocytic anemia. She has anemia of chronic renal disease, stage IV.  Work-up on 12/08/2016 and 04/09/2017 revealed a low normal B12 with normal MMA thus r/o B12 deficiency.  SPEP revealed no monoclonal protein. Free light chain ratio was 2.43 (0.26-1.65), unclear  significance.  Ferritin, iron  saturation,andfolate were normal.TSH is normal. Retic was 1.2% (inappropriately low) on 09/07/2016 and 1.1% on 06/23/2017. Coombs was negative on 10/19/2016 and 06/24/2017.  Creatinine was 1.41 (CrCl 36 ml/min) on 06/29/2017 and 1.94 (CrCl 19.5 ml/min) on 02/23/2018. She receives Procrit (last 09/22/2018).  24 hour urine (IFE) on 12/28/2016 revealed no monoclonal protein. 24 hour urine on 06/23/2017 revealed kappa free light chains 157 (1.35-24.19), lambda free light chains 6.99 (0.24-6.66), and free light chain ratio 22.46 (2.04-10.37).  Kappa free light chains were 159.9 (ratio 2.79) on 06/23/2017.  SPEP was normal on 06/23/2017.  Bone marrow aspirate and biopsy on 07/07/2017 revealed a variably cellular bone marrow with trilineage hematopoiesis.  There was slight plasmacytosis (4%).  Plasma cells were polyclonal for kappa and lambda light chains.  There were several lymphoid aggregates.  Congo stain for amyloid was negative.  Flow cytometry revealed no monoclonal B-cell population or abnormal T-cell population.  Cytogenetics were normal (41, XX).  She has a history of chronic hyponatremia(dating back to 07/2012). She is followed by nephrology.  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  She is on fluid restriction. Cortisol and uric acid were normal on 06/24/2017.   She has scleroderma.  She has skin thickening at sites of abrasion.  She has hypercalcemia felt related to thiazide diuretic and excess calcium intake.  She was seen by endocrinology.  PTH was 14 (low) and not c/w primary hyperparathyroidism.  TSH was normal. PTH-rp on 06/23/2017 was < 2.0.  ACE level was 47 (normal) on 06/23/2017.  Calcitriol (vitamin D 1, 25 dihydroxy) was 75.3 (19.9-79.3).  Vitamin D, 25 hydroxy was 68.6 (30-100) on 06/22/2017.  She denies excess vitamin A and vitamin D intake.   Symptomatically, she is doing well.  Exam is stable.  Plan: 1.  Labs today:  CBC with diff, CMP, iron studies,  ferritin, B12, folate, TSH, retic, CA27.29. 2.  Stage IIA left breast cancer Clinically, she continues to do well.  Exam reveals no evidence of recurrent disease. CA 27.29 is 17.1 (normal).  Follow-up mammogram scheduled for tomorrow. Continue tamoxifen. 3.  Anemia of chronic renal disease Hematocrit 28.7.  Hemoglobin 9.9. Creatinine has improved from 1.94 to 1.0 (normal) Hold Procrit with improvement in renal function. Additional labs drawn today. 4.  Osteopenia Continue Fosamax. 5.  SIADH Sodium is 127. Follow-up with Dr. Holley Raring tomorrow (labs sent). 6.  HYPERcalcemia  Calcium is 8.8 (corrected 8.97). Continue to monitor. 7.   B12 deficiency             B12 was 295 on 06/09/2016.             Patient on oral B12.             B12 and folate level today. 8.   RTC for labs (CBC with diff, ferritin). 9.   RTC in 6 months for MD assessment, labs (CBC with diff, CMP, CA27.29), and review of mammogram.  Addendum:  Right screening mammogram on 10/13/2018 revealed no evidence of malignancy.  I discussed the assessment and treatment plan with the patient.  The patient was provided an opportunity to ask questions and all were answered.  The patient agreed with the plan and demonstrated an understanding of the instructions.  The patient was advised to call back if the symptoms worsen or if the condition fails to improve as anticipated.  I provided 20 minutes of face-to-face time during this this encounter and > 50% was spent counseling as documented under  my assessment and plan.    Lequita Asal, MD, PhD    10/12/2018, 10:02 AM  I, Molly Dorshimer, am acting as Education administrator for [employer]. Mike Gip, MD, PhD.  I, Melissa C. Mike Gip, MD, have reviewed the above documentation for accuracy and completeness, and I agree with the above.

## 2018-10-12 ENCOUNTER — Inpatient Hospital Stay: Payer: PPO | Attending: Hematology and Oncology | Admitting: Hematology and Oncology

## 2018-10-12 ENCOUNTER — Inpatient Hospital Stay: Payer: PPO

## 2018-10-12 ENCOUNTER — Other Ambulatory Visit: Payer: Self-pay | Admitting: Hematology and Oncology

## 2018-10-12 ENCOUNTER — Other Ambulatory Visit: Payer: Self-pay

## 2018-10-12 ENCOUNTER — Encounter: Payer: Self-pay | Admitting: Hematology and Oncology

## 2018-10-12 VITALS — BP 161/78 | HR 78 | Temp 97.9°F | Resp 18 | Ht 61.0 in | Wt 137.2 lb

## 2018-10-12 DIAGNOSIS — N189 Chronic kidney disease, unspecified: Secondary | ICD-10-CM | POA: Insufficient documentation

## 2018-10-12 DIAGNOSIS — D631 Anemia in chronic kidney disease: Secondary | ICD-10-CM | POA: Insufficient documentation

## 2018-10-12 DIAGNOSIS — M8580 Other specified disorders of bone density and structure, unspecified site: Secondary | ICD-10-CM | POA: Insufficient documentation

## 2018-10-12 DIAGNOSIS — M8588 Other specified disorders of bone density and structure, other site: Secondary | ICD-10-CM

## 2018-10-12 DIAGNOSIS — E538 Deficiency of other specified B group vitamins: Secondary | ICD-10-CM

## 2018-10-12 DIAGNOSIS — C50412 Malignant neoplasm of upper-outer quadrant of left female breast: Secondary | ICD-10-CM | POA: Insufficient documentation

## 2018-10-12 DIAGNOSIS — E876 Hypokalemia: Secondary | ICD-10-CM

## 2018-10-12 LAB — CBC WITH DIFFERENTIAL/PLATELET
Abs Immature Granulocytes: 0.02 10*3/uL (ref 0.00–0.07)
Basophils Absolute: 0.1 10*3/uL (ref 0.0–0.1)
Basophils Relative: 1 %
Eosinophils Absolute: 0.2 10*3/uL (ref 0.0–0.5)
Eosinophils Relative: 4 %
HCT: 28.7 % — ABNORMAL LOW (ref 36.0–46.0)
Hemoglobin: 9.9 g/dL — ABNORMAL LOW (ref 12.0–15.0)
Immature Granulocytes: 0 %
Lymphocytes Relative: 19 %
Lymphs Abs: 0.9 10*3/uL (ref 0.7–4.0)
MCH: 33.1 pg (ref 26.0–34.0)
MCHC: 34.5 g/dL (ref 30.0–36.0)
MCV: 96 fL (ref 80.0–100.0)
Monocytes Absolute: 0.7 10*3/uL (ref 0.1–1.0)
Monocytes Relative: 15 %
Neutro Abs: 3 10*3/uL (ref 1.7–7.7)
Neutrophils Relative %: 61 %
Platelets: 245 10*3/uL (ref 150–400)
RBC: 2.99 MIL/uL — ABNORMAL LOW (ref 3.87–5.11)
RDW: 14.3 % (ref 11.5–15.5)
WBC: 4.8 10*3/uL (ref 4.0–10.5)
nRBC: 0 % (ref 0.0–0.2)

## 2018-10-12 LAB — VITAMIN B12: Vitamin B-12: 2132 pg/mL — ABNORMAL HIGH (ref 180–914)

## 2018-10-12 LAB — COMPREHENSIVE METABOLIC PANEL
ALT: 11 U/L (ref 0–44)
AST: 20 U/L (ref 15–41)
Albumin: 3.8 g/dL (ref 3.5–5.0)
Alkaline Phosphatase: 58 U/L (ref 38–126)
Anion gap: 7 (ref 5–15)
BUN: 19 mg/dL (ref 8–23)
CO2: 24 mmol/L (ref 22–32)
Calcium: 8.8 mg/dL — ABNORMAL LOW (ref 8.9–10.3)
Chloride: 96 mmol/L — ABNORMAL LOW (ref 98–111)
Creatinine, Ser: 1 mg/dL (ref 0.44–1.00)
GFR calc Af Amer: >60 mL/min (ref >60)
GFR calc non Af Amer: 56 mL/min — ABNORMAL LOW (ref >60)
Glucose, Bld: 76 mg/dL (ref 70–99)
Potassium: 3.8 mmol/L (ref 3.5–5.1)
Sodium: 127 mmol/L — ABNORMAL LOW (ref 135–145)
Total Bilirubin: 0.4 mg/dL (ref 0.3–1.2)
Total Protein: 8.1 g/dL (ref 6.5–8.1)

## 2018-10-12 LAB — FOLATE: Folate: 10.2 ng/mL (ref >5.9)

## 2018-10-12 LAB — RETICULOCYTES
Immature Retic Fract: 8.7 % (ref 2.3–15.9)
RBC.: 3.03 MIL/uL — ABNORMAL LOW (ref 3.87–5.11)
Retic Count, Absolute: 37 10*3/uL (ref 19.0–186.0)
Retic Ct Pct: 1.2 % (ref 0.4–3.1)

## 2018-10-12 LAB — IRON AND TIBC
Iron: 89 ug/dL (ref 28–170)
Saturation Ratios: 24 % (ref 10.4–31.8)
TIBC: 370 ug/dL (ref 250–450)
UIBC: 281 ug/dL

## 2018-10-12 LAB — TSH: TSH: 1.155 u[IU]/mL (ref 0.350–4.500)

## 2018-10-12 LAB — FERRITIN: Ferritin: 28 ng/mL (ref 11–307)

## 2018-10-12 NOTE — Progress Notes (Signed)
No new changes noted today 

## 2018-10-13 ENCOUNTER — Ambulatory Visit
Admission: RE | Admit: 2018-10-13 | Discharge: 2018-10-13 | Disposition: A | Discharge status: Home / Self Care | Payer: PPO | Source: Ambulatory Visit | Attending: Family Medicine | Admitting: Family Medicine

## 2018-10-13 ENCOUNTER — Other Ambulatory Visit: Payer: Self-pay | Admitting: Family Medicine

## 2018-10-13 DIAGNOSIS — I10 Essential (primary) hypertension: Secondary | ICD-10-CM | POA: Diagnosis not present

## 2018-10-13 DIAGNOSIS — R809 Proteinuria, unspecified: Secondary | ICD-10-CM | POA: Diagnosis not present

## 2018-10-13 DIAGNOSIS — Z1231 Encounter for screening mammogram for malignant neoplasm of breast: Secondary | ICD-10-CM | POA: Diagnosis not present

## 2018-10-13 DIAGNOSIS — D631 Anemia in chronic kidney disease: Secondary | ICD-10-CM | POA: Diagnosis not present

## 2018-10-13 DIAGNOSIS — E871 Hypo-osmolality and hyponatremia: Secondary | ICD-10-CM | POA: Diagnosis not present

## 2018-10-13 DIAGNOSIS — N183 Chronic kidney disease, stage 3 (moderate): Secondary | ICD-10-CM | POA: Diagnosis not present

## 2018-10-13 LAB — CANCER ANTIGEN 27.29: CA 27.29: 17.1 U/mL (ref 0.0–38.6)

## 2018-11-11 DIAGNOSIS — H2512 Age-related nuclear cataract, left eye: Secondary | ICD-10-CM | POA: Diagnosis not present

## 2018-11-11 DIAGNOSIS — N186 End stage renal disease: Secondary | ICD-10-CM | POA: Diagnosis not present

## 2018-11-14 ENCOUNTER — Other Ambulatory Visit: Payer: Self-pay

## 2018-11-14 ENCOUNTER — Encounter: Payer: Self-pay | Admitting: *Deleted

## 2018-11-14 ENCOUNTER — Other Ambulatory Visit
Admission: RE | Admit: 2018-11-14 | Discharge: 2018-11-14 | Disposition: A | Discharge status: Home / Self Care | Payer: PPO | Source: Ambulatory Visit | Attending: Ophthalmology | Admitting: Ophthalmology

## 2018-11-14 DIAGNOSIS — Z1159 Encounter for screening for other viral diseases: Secondary | ICD-10-CM | POA: Diagnosis not present

## 2018-11-14 LAB — SARS CORONAVIRUS 2 (TAT 6-24 HRS): SARS Coronavirus 2: NEGATIVE

## 2018-11-17 ENCOUNTER — Ambulatory Visit: Payer: PPO | Admitting: Certified Registered Nurse Anesthetist

## 2018-11-17 ENCOUNTER — Ambulatory Visit
Admission: RE | Admit: 2018-11-17 | Discharge: 2018-11-17 | Disposition: A | Discharge status: Home / Self Care | Payer: PPO | Attending: Ophthalmology | Admitting: Ophthalmology

## 2018-11-17 ENCOUNTER — Encounter
Admission: RE | Disposition: A | Discharge status: Home / Self Care | Payer: Self-pay | Source: Home / Self Care | Attending: Ophthalmology

## 2018-11-17 ENCOUNTER — Other Ambulatory Visit: Payer: Self-pay

## 2018-11-17 ENCOUNTER — Encounter: Payer: Self-pay | Admitting: *Deleted

## 2018-11-17 DIAGNOSIS — I252 Old myocardial infarction: Secondary | ICD-10-CM | POA: Insufficient documentation

## 2018-11-17 DIAGNOSIS — C50912 Malignant neoplasm of unspecified site of left female breast: Secondary | ICD-10-CM | POA: Insufficient documentation

## 2018-11-17 DIAGNOSIS — K219 Gastro-esophageal reflux disease without esophagitis: Secondary | ICD-10-CM | POA: Diagnosis not present

## 2018-11-17 DIAGNOSIS — Z87891 Personal history of nicotine dependence: Secondary | ICD-10-CM | POA: Diagnosis not present

## 2018-11-17 DIAGNOSIS — J449 Chronic obstructive pulmonary disease, unspecified: Secondary | ICD-10-CM | POA: Insufficient documentation

## 2018-11-17 DIAGNOSIS — Z9012 Acquired absence of left breast and nipple: Secondary | ICD-10-CM | POA: Diagnosis not present

## 2018-11-17 DIAGNOSIS — E7800 Pure hypercholesterolemia, unspecified: Secondary | ICD-10-CM | POA: Insufficient documentation

## 2018-11-17 DIAGNOSIS — Z79899 Other long term (current) drug therapy: Secondary | ICD-10-CM | POA: Insufficient documentation

## 2018-11-17 DIAGNOSIS — N183 Chronic kidney disease, stage 3 (moderate): Secondary | ICD-10-CM | POA: Diagnosis not present

## 2018-11-17 DIAGNOSIS — I129 Hypertensive chronic kidney disease with stage 1 through stage 4 chronic kidney disease, or unspecified chronic kidney disease: Secondary | ICD-10-CM | POA: Insufficient documentation

## 2018-11-17 DIAGNOSIS — Z79810 Long term (current) use of selective estrogen receptor modulators (SERMs): Secondary | ICD-10-CM | POA: Diagnosis not present

## 2018-11-17 DIAGNOSIS — H2512 Age-related nuclear cataract, left eye: Secondary | ICD-10-CM | POA: Insufficient documentation

## 2018-11-17 HISTORY — DX: Chronic obstructive pulmonary disease, unspecified: J44.9

## 2018-11-17 HISTORY — PX: CATARACT EXTRACTION W/PHACO: SHX586

## 2018-11-17 HISTORY — DX: Dyspnea, unspecified: R06.00

## 2018-11-17 HISTORY — DX: Acute myocardial infarction, unspecified: I21.9

## 2018-11-17 SURGERY — PHACOEMULSIFICATION, CATARACT, WITH IOL INSERTION
Anesthesia: Monitor Anesthesia Care | Site: Eye | Laterality: Left

## 2018-11-17 MED ORDER — LIDOCAINE HCL (PF) 4 % IJ SOLN
INTRAOCULAR | Status: DC | PRN
  Administered 2018-11-17: 4 mL via OPHTHALMIC

## 2018-11-17 MED ORDER — TRYPAN BLUE 0.06 % OP SOLN
OPHTHALMIC | Status: DC | PRN
  Administered 2018-11-17: 0.5 mL via INTRAOCULAR

## 2018-11-17 MED ORDER — MIDAZOLAM HCL 2 MG/2ML IJ SOLN
INTRAMUSCULAR | Status: DC | PRN
  Administered 2018-11-17 (×2): 0.5 mg via INTRAVENOUS

## 2018-11-17 MED ORDER — POVIDONE-IODINE 5 % OP SOLN
OPHTHALMIC | Status: DC | PRN
  Administered 2018-11-17: 1 via OPHTHALMIC

## 2018-11-17 MED ORDER — MOXIFLOXACIN HCL 0.5 % OP SOLN: 1.0000 [drp] | OPHTHALMIC | Status: DC | PRN

## 2018-11-17 MED ORDER — MIDAZOLAM HCL 2 MG/2ML IJ SOLN
INTRAMUSCULAR | Status: AC
  Filled 2018-11-17: qty 2

## 2018-11-17 MED ORDER — CARBACHOL 0.01 % IO SOLN
INTRAOCULAR | Status: DC | PRN
  Administered 2018-11-17: 0.5 mL via INTRAOCULAR

## 2018-11-17 MED ORDER — NA CHONDROIT SULF-NA HYALURON 40-17 MG/ML IO SOLN
INTRAOCULAR | Status: DC | PRN
  Administered 2018-11-17: 1 mL via INTRAOCULAR

## 2018-11-17 MED ORDER — EPINEPHRINE PF 1 MG/ML IJ SOLN
INTRAOCULAR | Status: DC | PRN
  Administered 2018-11-17: 1 mL via OPHTHALMIC

## 2018-11-17 MED ORDER — ARMC OPHTHALMIC DILATING DROPS
1.0000 "application " | OPHTHALMIC | Status: AC
  Administered 2018-11-17 (×3): 1 via OPHTHALMIC

## 2018-11-17 MED ORDER — MOXIFLOXACIN HCL 0.5 % OP SOLN
OPHTHALMIC | Status: DC | PRN
  Administered 2018-11-17: 0.2 mL via OPHTHALMIC

## 2018-11-17 MED ORDER — SODIUM CHLORIDE 0.9 % IV SOLN
INTRAVENOUS | Status: DC
  Administered 2018-11-17: 11:00:00 via INTRAVENOUS

## 2018-11-17 MED ORDER — TETRACAINE HCL 0.5 % OP SOLN
1.0000 [drp] | OPHTHALMIC | Status: AC | PRN
  Administered 2018-11-17 (×2): 1 [drp] via OPHTHALMIC

## 2018-11-17 SURGICAL SUPPLY — 20 items
DISSECTOR HYDRO NUCLEUS 50X22 (MISCELLANEOUS) ×12 IMPLANT
DRSG TEGADERM 2-3/8X2-3/4 SM (GAUZE/BANDAGES/DRESSINGS) ×3 IMPLANT
GLOVE BIOGEL M 6.5 STRL (GLOVE) ×3 IMPLANT
GOWN STRL REUS W/ TWL LRG LVL3 (GOWN DISPOSABLE) ×1 IMPLANT
GOWN STRL REUS W/ TWL XL LVL3 (GOWN DISPOSABLE) ×1 IMPLANT
GOWN STRL REUS W/TWL LRG LVL3 (GOWN DISPOSABLE) ×3
GOWN STRL REUS W/TWL XL LVL3 (GOWN DISPOSABLE) ×3
KNIFE 45D UP 2.3 (MISCELLANEOUS) ×3 IMPLANT
LABEL CATARACT MEDS ST (LABEL) ×3 IMPLANT
LENS IOL ACRSF IQ ULTRA 24.0 (Intraocular Lens) IMPLANT
LENS IOL ACRYSOF IQ 24.0 (Intraocular Lens) ×3 IMPLANT
NDL CAPSULORHEX 25GA (NEEDLE) IMPLANT
NEEDLE CAPSULORHEX 25GA (NEEDLE) ×3 IMPLANT
PACK CATARACT (MISCELLANEOUS) ×3 IMPLANT
PACK CATARACT KING (MISCELLANEOUS) ×3 IMPLANT
PACK EYE AFTER SURG (MISCELLANEOUS) ×3 IMPLANT
SOL BSS BAG (MISCELLANEOUS) ×3
SOLUTION BSS BAG (MISCELLANEOUS) ×1 IMPLANT
WATER STERILE IRR 250ML POUR (IV SOLUTION) ×3 IMPLANT
WIPE NON LINTING 3.25X3.25 (MISCELLANEOUS) ×3 IMPLANT

## 2018-11-17 NOTE — Op Note (Signed)
.   PREOPERATIVE DIAGNOSIS:  Nuclear sclerotic cataract of the LEFT eye.   POSTOPERATIVE DIAGNOSIS:  Nuclear sclerotic cataract of the LEFT eye.   OPERATIVE PROCEDURE: Cataract surgery OS   SURGEON:  Marchia Meiers, MD.   ANESTHESIA:  Anesthesiologist: Gunnar Fusi, MD CRNA: Eben Burow, CRNA  1.      Managed anesthesia care. 2.     0.77ml of Shugarcaine was instilled following the paracentesis   COMPLICATIONS:  None.   TECHNIQUE:   Divide and conquer   DESCRIPTION OF PROCEDURE:  The patient was examined and consented in the preoperative holding area where the aforementioned topical anesthesia was applied to the LEFT eye and then brought back to the Operating Room where the left eye was prepped and draped in the usual sterile ophthalmic fashion and a lid speculum was placed. A paracentesis was created with the side port blade, the anterior chamber was washed out with trypan blue to stain the anterior capsule, and the anterior chamber was filled with viscoelastic. A near clear corneal incision was performed with the steel keratome. A continuous curvilinear capsulorrhexis was performed with a cystotome followed by the capsulorrhexis forceps. Hydrodissection and hydrodelineation were carried out with BSS on a blunt cannula. The lens was removed in a divide and conquer  technique and the remaining cortical material was removed with the irrigation-aspiration handpiece. The capsular bag was inflated with viscoelastic and the lens was placed in the capsular bag without complication. The remaining viscoelastic was removed from the eye with the irrigation-aspiration handpiece. The wounds were hydrated. The anterior chamber was flushed and the eye was inflated to physiologic pressure. 0.2ml Vigamox was placed in the anterior chamber. The wounds were found to be water tight. The eye was dressed with Vigamox. The patient was given protective glasses to wear throughout the day and a shield with which to  sleep tonight. The patient was also given drops with which to begin a drop regimen today and will follow-up with me in one day. Implant Name Type Inv. Item Serial No. Manufacturer Lot No. LRB No. Used Action  LENS IOL ACRYSOF IQ 24.0 - [id number] Intraocular Lens LENS IOL ACRYSOF IQ 24.0 [id number] [employer]  Left 1 Implanted    Procedure(s) with comments: CATARACT EXTRACTION PHACO AND INTRAOCULAR LENS PLACEMENT (Left) - [place] 00:55 CDE 11.04 Fluid Pack Lot # [id number]  Electronically signed: Marchia Meiers 11/17/2018 2:11 PM

## 2018-11-17 NOTE — Transfer of Care (Signed)
Immediate Anesthesia Transfer of Care Note  Patient: Bianca Shaw  Procedure(s) Performed: CATARACT EXTRACTION PHACO AND INTRAOCULAR LENS PLACEMENT (Left Eye)  Patient Location: Short Stay  Anesthesia Type:MAC  Level of Consciousness: awake, alert , oriented and patient cooperative  Airway & Oxygen Therapy: Patient Spontanous Breathing  Post-op Assessment: Report given to RN and Post -op Vital signs reviewed and stable  Post vital signs: Reviewed and stable  Last Vitals:  Vitals Value Taken Time  BP 173/64 11/17/18 1406  Temp 36.2 C 11/17/18 1406  Pulse 55 11/17/18 1406  Resp 16 11/17/18 1406  SpO2 100 % 11/17/18 1406    Last Pain:  Vitals:   11/17/18 1406  TempSrc: Temporal  PainSc: 0-No pain         Complications: No apparent anesthesia complications

## 2018-11-17 NOTE — Discharge Instructions (Signed)
Eye Surgery Discharge Instructions  Expect mild scratchy sensation or mild soreness. DO NOT RUB YOUR EYE!  The day of surgery:  Minimal physical activity, but bed rest is not required  No reading, computer work, or close hand work  No bending, lifting, or straining.  May watch TV  For 24 hours:  No driving, legal decisions, or alcoholic beverages  Safety precautions  Eat anything you prefer: It is better to start with liquids, then soup then solid foods.  _____ Eye patch should be worn until postoperative exam tomorrow.  ____ Solar shield eyeglasses should be worn for comfort in the sunlight/patch while sleeping  Resume all regular medications including aspirin or Coumadin if these were discontinued prior to surgery. You may shower, bathe, shave, or wash your hair. Tylenol may be taken for mild discomfort. Follow eye drop instruction sheet as reviewed.  Call your doctor if you experience significant pain, nausea, or vomiting, fever > 101 or other signs of infection. [phone] or [phone] Specific instructions:  Follow-up Information    Marchia Meiers, MD Follow up.   Specialty: Ophthalmology Why: 11/18/18 @ 11:00 am Contact information: [address] [place] [address] [phone]

## 2018-11-17 NOTE — Anesthesia Preprocedure Evaluation (Signed)
Anesthesia Evaluation  Patient identified by MRN, date of birth, ID band  Reviewed: Allergy & Precautions, NPO status , Patient's Chart, lab work & pertinent test results  History of Anesthesia Complications (+) Family history of anesthesia reactionNegative for: history of anesthetic complications  Airway Mallampati: II       Dental   Pulmonary neg sleep apnea, COPD (mild no meds now), former smoker,           Cardiovascular hypertension, Pt. on medications + Past MI and + PND  (-) CHF      Neuro/Psych neg Seizures Anxiety    GI/Hepatic Neg liver ROS, PUD, GERD  Medicated,  Endo/Other  neg diabetes  Renal/GU Renal InsufficiencyRenal disease     Musculoskeletal   Abdominal   Peds  Hematology  (+) anemia ,   Anesthesia Other Findings   Reproductive/Obstetrics                             Anesthesia Physical Anesthesia Plan  ASA: III  Anesthesia Plan: MAC   Post-op Pain Management:    Induction:   PONV Risk Score and Plan:   Airway Management Planned: Nasal Cannula  Additional Equipment:   Intra-op Plan:   Post-operative Plan:   Informed Consent: I have reviewed the patients History and Physical, chart, labs and discussed the procedure including the risks, benefits and alternatives for the proposed anesthesia with the patient or authorized representative who has indicated his/her understanding and acceptance.       Plan Discussed with:   Anesthesia Plan Comments:         Anesthesia Quick Evaluation

## 2018-11-17 NOTE — Anesthesia Post-op Follow-up Note (Signed)
Anesthesia QCDR form completed.        

## 2018-11-17 NOTE — H&P (Signed)
   I have reviewed the patient's H&P and agree with its findings. There have been no interval changes.  Sherel Fennell MD Ophthalmology 

## 2018-11-17 NOTE — Anesthesia Postprocedure Evaluation (Signed)
Anesthesia Post Note  Patient: Bianca Shaw  Procedure(s) Performed: CATARACT EXTRACTION PHACO AND INTRAOCULAR LENS PLACEMENT (Left Eye)  Patient location during evaluation: Short Stay Anesthesia Type: MAC Level of consciousness: awake and alert and oriented Pain management: satisfactory to patient Vital Signs Assessment: post-procedure vital signs reviewed and stable Respiratory status: spontaneous breathing, nonlabored ventilation and respiratory function stable Cardiovascular status: stable Postop Assessment: no apparent nausea or vomiting Anesthetic complications: no     Last Vitals:  Vitals:   11/17/18 1051 11/17/18 1406  BP: (!) 160/56 (!) 173/64  Pulse:  (!) 55  Resp: (!) 54 16  Temp: 36.6 C (!) 36.2 C  SpO2: 100% 100%    Last Pain:  Vitals:   11/17/18 1406  TempSrc: Temporal  PainSc: 0-No pain                 Eben Burow

## 2018-11-21 ENCOUNTER — Telehealth: Payer: Self-pay

## 2018-11-21 ENCOUNTER — Other Ambulatory Visit: Payer: Self-pay

## 2018-11-21 DIAGNOSIS — C50412 Malignant neoplasm of upper-outer quadrant of left female breast: Secondary | ICD-10-CM

## 2018-11-21 NOTE — Telephone Encounter (Signed)
Spoke with husband, [name], and informed him Dr. Mike Gip would like patient to decrease Vit B12 dosage down to 1000 MCG per day. Also informed him that she would like for her to start taking Ferrous Sulfate 325 MG per day with OJ and if she tolerates this well after a couple of weeks then she can increase this does to BID. Husband wrote everything down and verbalized understanding. Denies any further questions or concerns.

## 2018-11-21 NOTE — Telephone Encounter (Signed)
-----   Message from Lequita Asal, MD sent at 11/21/2018 12:24 PM EDT ----- Regarding: RE: Please call patient  That is a large dose of B12.  We typically recommend B12 1000 mcg a day.  I would suggest oral iron (ferrous sulfate 1 tablet a day with OJ or vitamin C). Can advance dose to BID if tolerating well.  M ----- Message ----- From: Vito Berger, [place] Sent: 11/21/2018   9:21 AM EDT To: Lequita Asal, MD Subject: RE: Please call patient                        Spoke with the patient and she reports she is taking 5,000 mcg of B-12 and she is currently not taking any Iron. I have her schedule for labs on 11/22/2018  at 10:15 AM ----- Message ----- From: Lequita Asal, MD Sent: 11/19/2018   2:56 PM EDT To: Arlan Organ, RN, # Subject: Please call patient                             Bianca Shaw, Bianca Shaw" [id number]  Patient needs follow-up labs (CBC, BMP, ferritin, retic) unless recently drawn.  How much B12 is she taking?  Is she on any iron?  M

## 2018-11-22 ENCOUNTER — Inpatient Hospital Stay: Payer: PPO | Attending: Hematology and Oncology

## 2018-11-22 ENCOUNTER — Telehealth: Payer: Self-pay

## 2018-11-22 ENCOUNTER — Other Ambulatory Visit: Payer: Self-pay

## 2018-11-22 DIAGNOSIS — C50412 Malignant neoplasm of upper-outer quadrant of left female breast: Secondary | ICD-10-CM | POA: Diagnosis not present

## 2018-11-22 LAB — CBC WITH DIFFERENTIAL/PLATELET
Abs Immature Granulocytes: 0.01 10*3/uL (ref 0.00–0.07)
Basophils Absolute: 0.1 10*3/uL (ref 0.0–0.1)
Basophils Relative: 1 %
Eosinophils Absolute: 0.3 10*3/uL (ref 0.0–0.5)
Eosinophils Relative: 5 %
HCT: 29.7 % — ABNORMAL LOW (ref 36.0–46.0)
Hemoglobin: 10.2 g/dL — ABNORMAL LOW (ref 12.0–15.0)
Immature Granulocytes: 0 %
Lymphocytes Relative: 21 %
Lymphs Abs: 1.2 10*3/uL (ref 0.7–4.0)
MCH: 33.1 pg (ref 26.0–34.0)
MCHC: 34.3 g/dL (ref 30.0–36.0)
MCV: 96.4 fL (ref 80.0–100.0)
Monocytes Absolute: 0.7 10*3/uL (ref 0.1–1.0)
Monocytes Relative: 12 %
Neutro Abs: 3.5 10*3/uL (ref 1.7–7.7)
Neutrophils Relative %: 61 %
Platelets: 213 10*3/uL (ref 150–400)
RBC: 3.08 MIL/uL — ABNORMAL LOW (ref 3.87–5.11)
RDW: 14.2 % (ref 11.5–15.5)
WBC: 5.7 10*3/uL (ref 4.0–10.5)
nRBC: 0 % (ref 0.0–0.2)

## 2018-11-22 LAB — FERRITIN: Ferritin: 25 ng/mL (ref 11–307)

## 2018-11-22 LAB — BASIC METABOLIC PANEL
Anion gap: 7 (ref 5–15)
BUN: 20 mg/dL (ref 8–23)
CO2: 23 mmol/L (ref 22–32)
Calcium: 9.1 mg/dL (ref 8.9–10.3)
Chloride: 96 mmol/L — ABNORMAL LOW (ref 98–111)
Creatinine, Ser: 1.17 mg/dL — ABNORMAL HIGH (ref 0.44–1.00)
GFR calc Af Amer: 54 mL/min — ABNORMAL LOW (ref >60)
GFR calc non Af Amer: 46 mL/min — ABNORMAL LOW (ref >60)
Glucose, Bld: 109 mg/dL — ABNORMAL HIGH (ref 70–99)
Potassium: 4.3 mmol/L (ref 3.5–5.1)
Sodium: 126 mmol/L — ABNORMAL LOW (ref 135–145)

## 2018-11-22 LAB — RETICULOCYTES
Immature Retic Fract: 8.3 % (ref 2.3–15.9)
RBC.: 3.1 MIL/uL — ABNORMAL LOW (ref 3.87–5.11)
Retic Count, Absolute: 35.3 10*3/uL (ref 19.0–186.0)
Retic Ct Pct: 1.1 % (ref 0.4–3.1)

## 2018-11-22 NOTE — Telephone Encounter (Signed)
Labs forwarded to Dr. Ellison Hughs to review Hyponatremia.   Contacted patient and informed her of result and advised, per Dr. Mike Gip, that she needed to follow up with her PCP regarding Hyponatremia. Patient verbalizes understanding and reports she will contact PCP office. Denies any further questions or concerns.

## 2018-11-22 NOTE — Telephone Encounter (Signed)
-----   Message from Lequita Asal, MD sent at 11/22/2018  1:24 PM EDT ----- Regarding: Please call patient and send BMP to PCP  Sodium remains low.  Needs to follow-up with PCP.  M ----- Message ----- From: Buel Ream, Lab In [place] Sent: 11/22/2018  10:31 AM EDT To: Lequita Asal, MD

## 2018-12-06 ENCOUNTER — Other Ambulatory Visit: Payer: PPO

## 2018-12-19 ENCOUNTER — Other Ambulatory Visit: Payer: Self-pay | Admitting: Hematology and Oncology

## 2018-12-19 DIAGNOSIS — D649 Anemia, unspecified: Secondary | ICD-10-CM

## 2018-12-19 DIAGNOSIS — Z170 Estrogen receptor positive status [ER+]: Secondary | ICD-10-CM

## 2018-12-19 DIAGNOSIS — C50412 Malignant neoplasm of upper-outer quadrant of left female breast: Secondary | ICD-10-CM

## 2018-12-20 ENCOUNTER — Other Ambulatory Visit: Payer: Self-pay

## 2018-12-20 ENCOUNTER — Inpatient Hospital Stay: Payer: PPO | Attending: Hematology and Oncology

## 2018-12-20 DIAGNOSIS — M8588 Other specified disorders of bone density and structure, other site: Secondary | ICD-10-CM | POA: Insufficient documentation

## 2018-12-20 DIAGNOSIS — E538 Deficiency of other specified B group vitamins: Secondary | ICD-10-CM | POA: Diagnosis not present

## 2018-12-20 DIAGNOSIS — D631 Anemia in chronic kidney disease: Secondary | ICD-10-CM | POA: Insufficient documentation

## 2018-12-20 DIAGNOSIS — N189 Chronic kidney disease, unspecified: Secondary | ICD-10-CM | POA: Diagnosis not present

## 2018-12-20 DIAGNOSIS — C50412 Malignant neoplasm of upper-outer quadrant of left female breast: Secondary | ICD-10-CM | POA: Diagnosis not present

## 2018-12-20 LAB — HEMOGLOBIN: Hemoglobin: 10.3 g/dL — ABNORMAL LOW (ref 12.0–15.0)

## 2019-01-17 ENCOUNTER — Other Ambulatory Visit: Payer: Self-pay

## 2019-01-17 ENCOUNTER — Telehealth: Payer: Self-pay

## 2019-01-17 ENCOUNTER — Inpatient Hospital Stay: Payer: PPO | Attending: Hematology and Oncology | Admitting: Hematology and Oncology

## 2019-01-17 DIAGNOSIS — C50412 Malignant neoplasm of upper-outer quadrant of left female breast: Secondary | ICD-10-CM | POA: Diagnosis not present

## 2019-01-17 DIAGNOSIS — E538 Deficiency of other specified B group vitamins: Secondary | ICD-10-CM | POA: Insufficient documentation

## 2019-01-17 DIAGNOSIS — M8588 Other specified disorders of bone density and structure, other site: Secondary | ICD-10-CM | POA: Insufficient documentation

## 2019-01-17 DIAGNOSIS — N189 Chronic kidney disease, unspecified: Secondary | ICD-10-CM | POA: Insufficient documentation

## 2019-01-17 DIAGNOSIS — Z8711 Personal history of peptic ulcer disease: Secondary | ICD-10-CM

## 2019-01-17 DIAGNOSIS — Z8719 Personal history of other diseases of the digestive system: Secondary | ICD-10-CM

## 2019-01-17 DIAGNOSIS — D631 Anemia in chronic kidney disease: Secondary | ICD-10-CM | POA: Insufficient documentation

## 2019-01-17 LAB — COMPREHENSIVE METABOLIC PANEL
ALT: 11 U/L (ref 0–44)
AST: 19 U/L (ref 15–41)
Albumin: 3.9 g/dL (ref 3.5–5.0)
Alkaline Phosphatase: 57 U/L (ref 38–126)
Anion gap: 6 (ref 5–15)
BUN: 23 mg/dL (ref 8–23)
CO2: 23 mmol/L (ref 22–32)
Calcium: 8.8 mg/dL — ABNORMAL LOW (ref 8.9–10.3)
Chloride: 99 mmol/L (ref 98–111)
Creatinine, Ser: 1.27 mg/dL — ABNORMAL HIGH (ref 0.44–1.00)
GFR calc Af Amer: 48 mL/min — ABNORMAL LOW (ref >60)
GFR calc non Af Amer: 42 mL/min — ABNORMAL LOW (ref >60)
Glucose, Bld: 106 mg/dL — ABNORMAL HIGH (ref 70–99)
Potassium: 4.8 mmol/L (ref 3.5–5.1)
Sodium: 128 mmol/L — ABNORMAL LOW (ref 135–145)
Total Bilirubin: 0.3 mg/dL (ref 0.3–1.2)
Total Protein: 8 g/dL (ref 6.5–8.1)

## 2019-01-17 LAB — CBC WITH DIFFERENTIAL/PLATELET
Abs Immature Granulocytes: 0.01 10*3/uL (ref 0.00–0.07)
Basophils Absolute: 0.1 10*3/uL (ref 0.0–0.1)
Basophils Relative: 1 %
Eosinophils Absolute: 0.2 10*3/uL (ref 0.0–0.5)
Eosinophils Relative: 3 %
HCT: 28.4 % — ABNORMAL LOW (ref 36.0–46.0)
Hemoglobin: 9.6 g/dL — ABNORMAL LOW (ref 12.0–15.0)
Immature Granulocytes: 0 %
Lymphocytes Relative: 24 %
Lymphs Abs: 1.2 10*3/uL (ref 0.7–4.0)
MCH: 32.9 pg (ref 26.0–34.0)
MCHC: 33.8 g/dL (ref 30.0–36.0)
MCV: 97.3 fL (ref 80.0–100.0)
Monocytes Absolute: 0.8 10*3/uL (ref 0.1–1.0)
Monocytes Relative: 16 %
Neutro Abs: 2.8 10*3/uL (ref 1.7–7.7)
Neutrophils Relative %: 56 %
Platelets: 223 10*3/uL (ref 150–400)
RBC: 2.92 MIL/uL — ABNORMAL LOW (ref 3.87–5.11)
RDW: 14.3 % (ref 11.5–15.5)
WBC: 4.9 10*3/uL (ref 4.0–10.5)
nRBC: 0 % (ref 0.0–0.2)

## 2019-01-17 NOTE — Telephone Encounter (Signed)
Left a message to inform the patient that her sodium is a little low and Dr Mike Gip would like for you to increase the electrolytes intake/ sodium ( sodium today 128). I have also inform her any question please feel free to contact the office [phone].

## 2019-01-17 NOTE — Telephone Encounter (Signed)
-----   Message from Lequita Asal, MD sent at 01/17/2019 11:47 AM EDT ----- Regarding: Please call patient  Notify re: electrolytes  M ----- Message ----- From: Buel Ream, Lab In [place] Sent: 01/17/2019  10:18 AM EDT To: Lequita Asal, MD

## 2019-01-18 LAB — CANCER ANTIGEN 27.29: CA 27.29: 20.7 U/mL (ref 0.0–38.6)

## 2019-01-24 NOTE — Progress Notes (Signed)
This encounter was created in error - please disregard.  This encounter was created in error - please disregard.

## 2019-02-14 ENCOUNTER — Other Ambulatory Visit: Payer: Self-pay

## 2019-02-14 ENCOUNTER — Inpatient Hospital Stay: Payer: PPO | Attending: Hematology and Oncology

## 2019-02-14 DIAGNOSIS — D631 Anemia in chronic kidney disease: Secondary | ICD-10-CM | POA: Insufficient documentation

## 2019-02-14 DIAGNOSIS — C50412 Malignant neoplasm of upper-outer quadrant of left female breast: Secondary | ICD-10-CM

## 2019-02-14 DIAGNOSIS — E538 Deficiency of other specified B group vitamins: Secondary | ICD-10-CM | POA: Insufficient documentation

## 2019-02-14 DIAGNOSIS — M8588 Other specified disorders of bone density and structure, other site: Secondary | ICD-10-CM | POA: Insufficient documentation

## 2019-02-14 DIAGNOSIS — N189 Chronic kidney disease, unspecified: Secondary | ICD-10-CM | POA: Diagnosis not present

## 2019-02-14 LAB — CBC WITH DIFFERENTIAL/PLATELET
Abs Immature Granulocytes: 0.02 10*3/uL (ref 0.00–0.07)
Basophils Absolute: 0.1 10*3/uL (ref 0.0–0.1)
Basophils Relative: 1 %
Eosinophils Absolute: 0.2 10*3/uL (ref 0.0–0.5)
Eosinophils Relative: 4 %
HCT: 28.1 % — ABNORMAL LOW (ref 36.0–46.0)
Hemoglobin: 9.5 g/dL — ABNORMAL LOW (ref 12.0–15.0)
Immature Granulocytes: 0 %
Lymphocytes Relative: 22 %
Lymphs Abs: 1.1 10*3/uL (ref 0.7–4.0)
MCH: 34.2 pg — ABNORMAL HIGH (ref 26.0–34.0)
MCHC: 33.8 g/dL (ref 30.0–36.0)
MCV: 101.1 fL — ABNORMAL HIGH (ref 80.0–100.0)
Monocytes Absolute: 0.7 10*3/uL (ref 0.1–1.0)
Monocytes Relative: 14 %
Neutro Abs: 2.8 10*3/uL (ref 1.7–7.7)
Neutrophils Relative %: 59 %
Platelets: 242 10*3/uL (ref 150–400)
RBC: 2.78 MIL/uL — ABNORMAL LOW (ref 3.87–5.11)
RDW: 13.6 % (ref 11.5–15.5)
WBC: 4.8 10*3/uL (ref 4.0–10.5)
nRBC: 0 % (ref 0.0–0.2)

## 2019-02-14 LAB — FERRITIN: Ferritin: 55 ng/mL (ref 11–307)

## 2019-02-16 DIAGNOSIS — R809 Proteinuria, unspecified: Secondary | ICD-10-CM | POA: Diagnosis not present

## 2019-02-16 DIAGNOSIS — D631 Anemia in chronic kidney disease: Secondary | ICD-10-CM | POA: Diagnosis not present

## 2019-02-16 DIAGNOSIS — N1832 Chronic kidney disease, stage 3b: Secondary | ICD-10-CM | POA: Diagnosis not present

## 2019-02-16 DIAGNOSIS — E871 Hypo-osmolality and hyponatremia: Secondary | ICD-10-CM | POA: Diagnosis not present

## 2019-03-08 ENCOUNTER — Other Ambulatory Visit: Payer: Self-pay

## 2019-03-08 DIAGNOSIS — D649 Anemia, unspecified: Secondary | ICD-10-CM

## 2019-03-08 DIAGNOSIS — C50412 Malignant neoplasm of upper-outer quadrant of left female breast: Secondary | ICD-10-CM

## 2019-03-13 ENCOUNTER — Other Ambulatory Visit: Payer: Self-pay

## 2019-03-14 ENCOUNTER — Inpatient Hospital Stay: Payer: PPO

## 2019-03-22 IMAGING — CR DG BONE SURVEY MET
8 of 10 series · 8 of 10 positions shown · non-contrast
Comparison: Chest CT 06/04/2017. CT abdomen and pelvis 06/11/2017.
Chest x-ray 05/24/2017.

CLINICAL DATA: Hypercalcemia, weakness

EXAM:
METASTATIC BONE SURVEY

[chest pa]
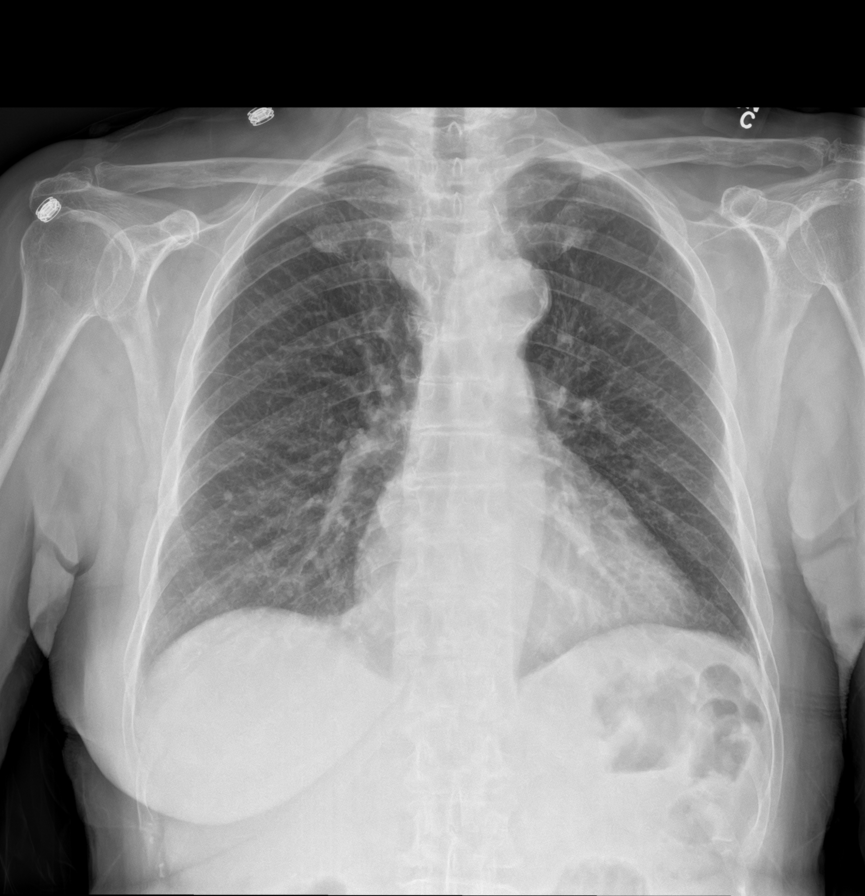

[c-spine lat]
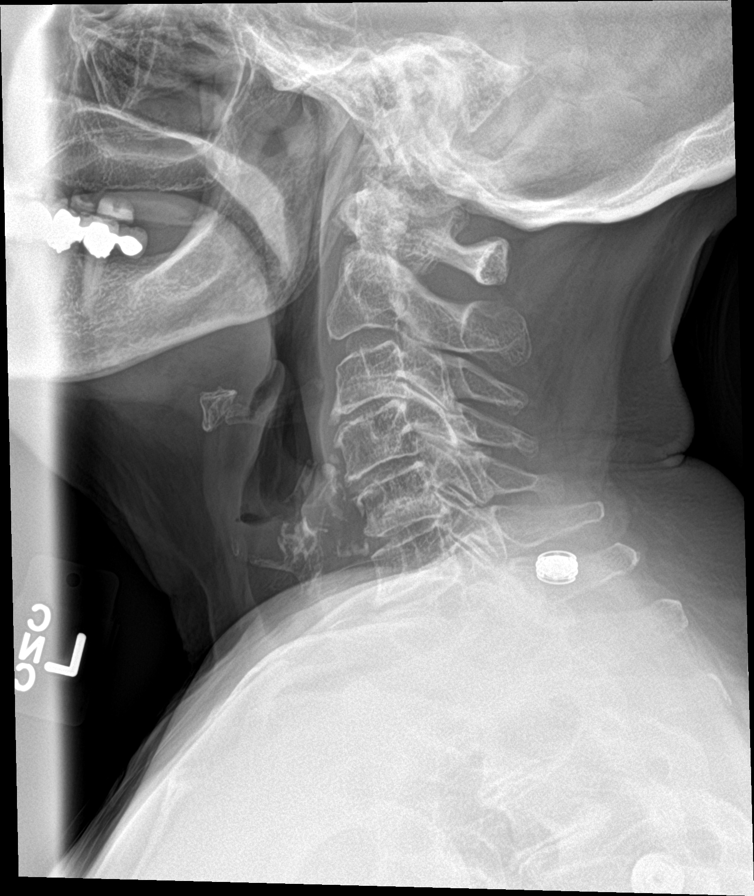

[c-spine swimmers]
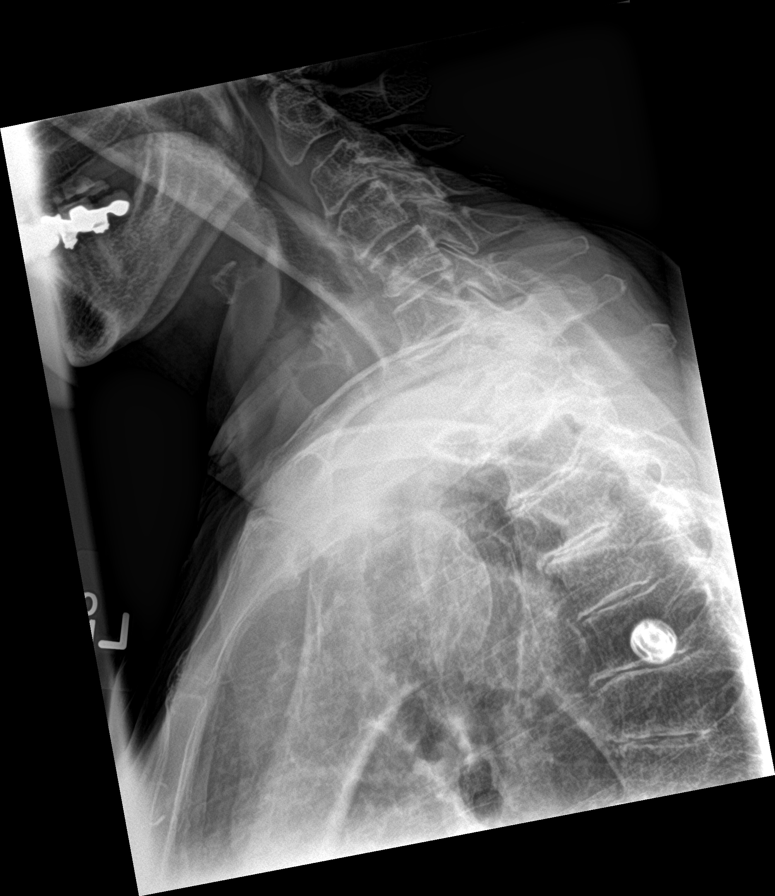

[skull lat]
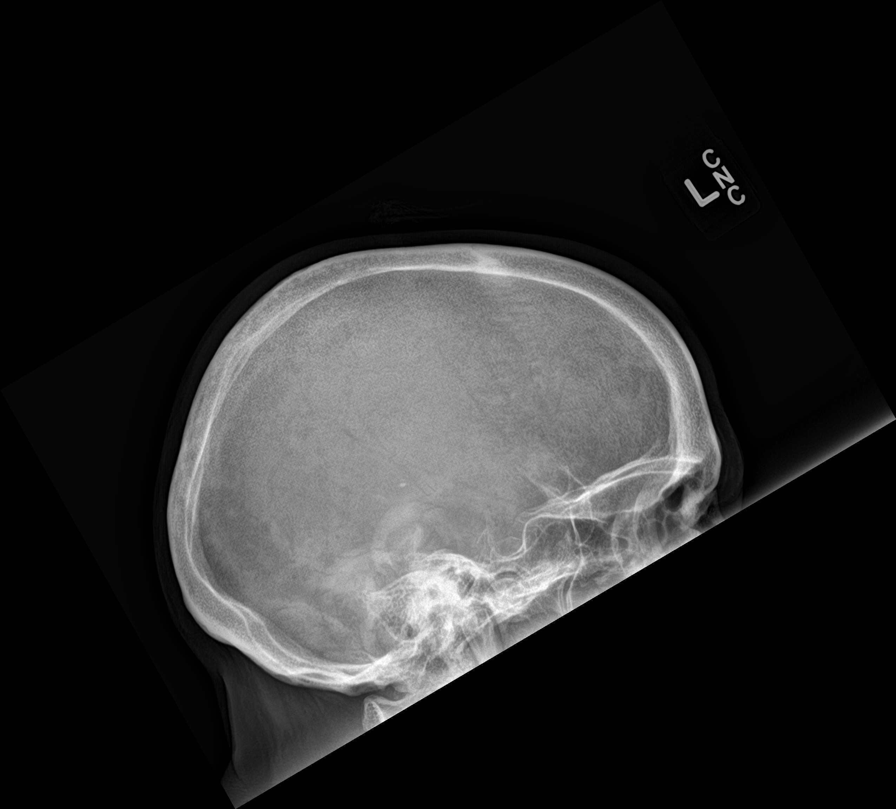

[c-spine ap]
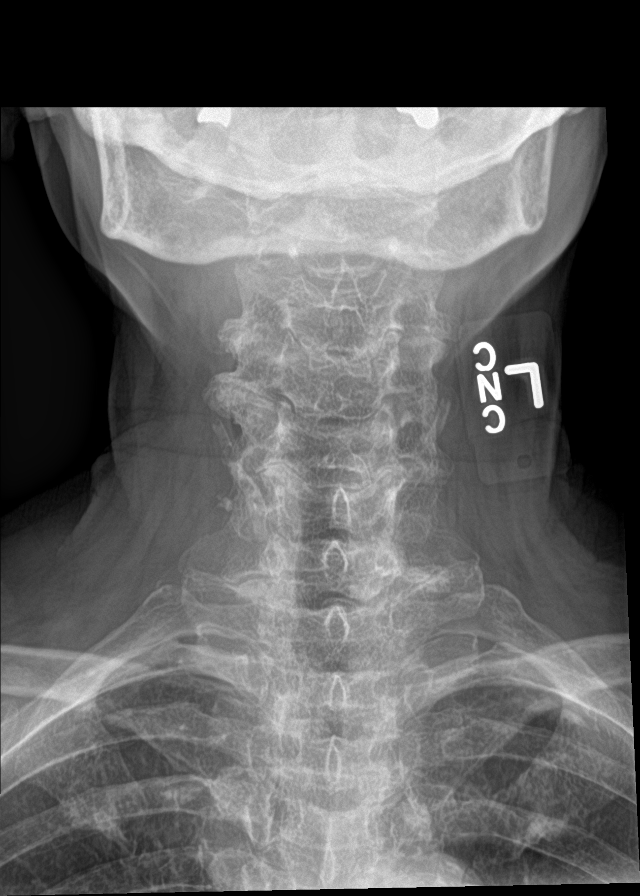

[shoulder ap (1 of 2)]
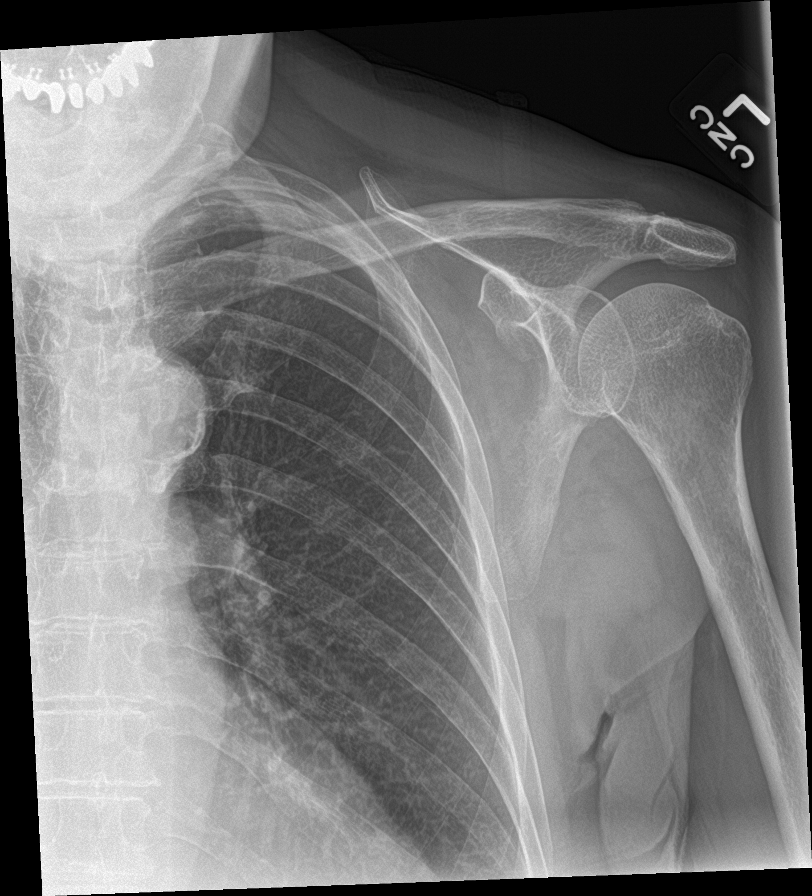

[shoulder ap (2 of 2)]
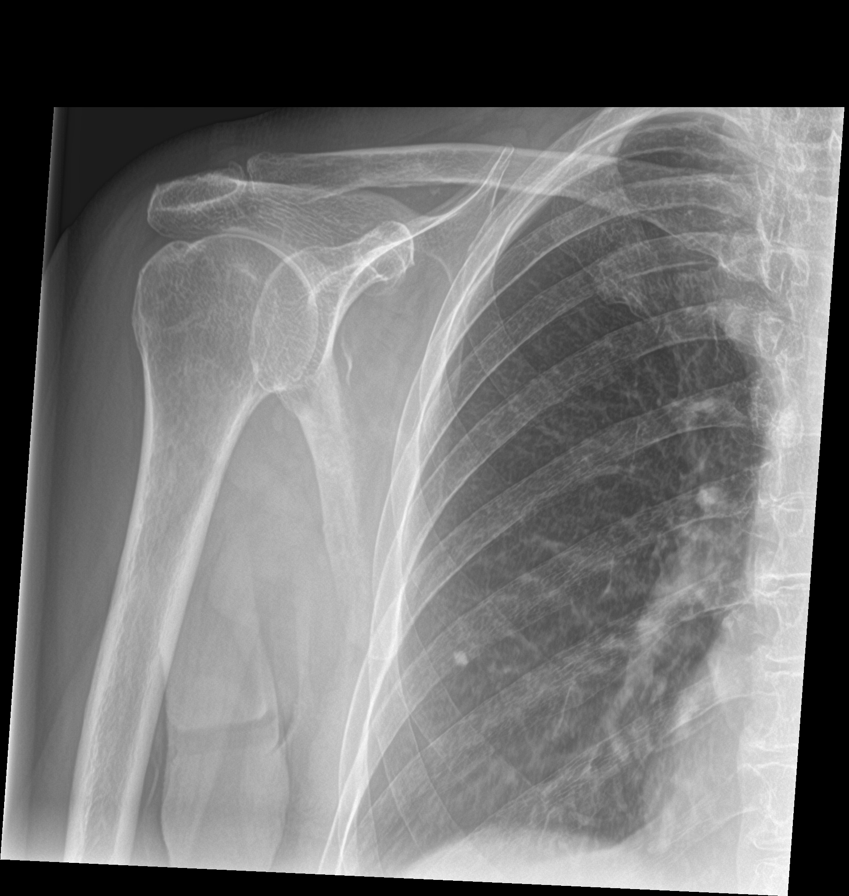

[humerus ap]
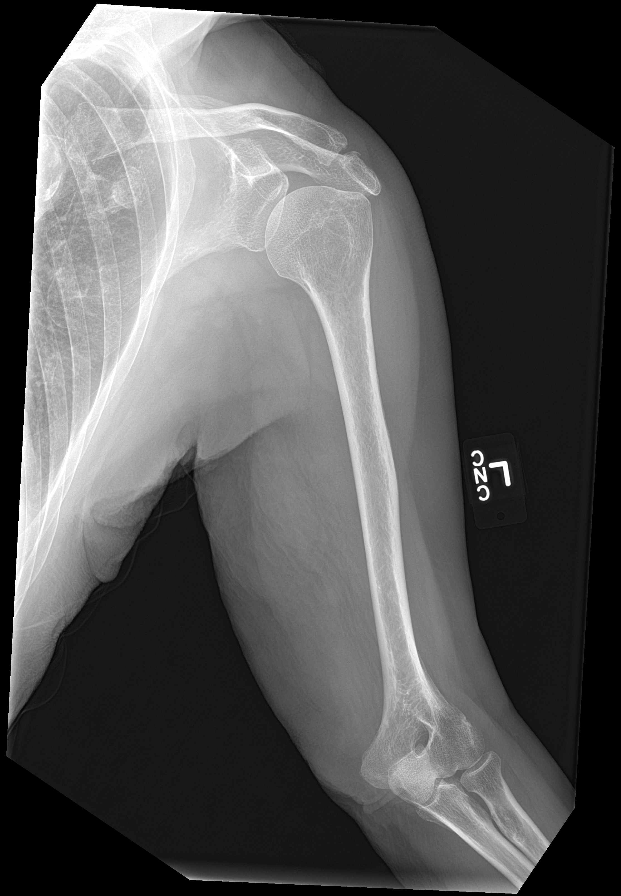

[8 of 10 positions shown; findings below may reference images not displayed]

FINDINGS: Calcified granuloma in the right mid lung. Lungs otherwise clear.
Heart is normal size. No effusions.

Degenerative changes in the cervical, thoracic and lumbar spine with
disc space narrowing and spurring. No focal bone lesion or acute
bony abnormality.

No focal lytic or sclerotic bone lesion within the visualized
skeleton. No acute bony abnormality. Aortoiliac atherosclerosis. No
visible aneurysm.
IMPRESSION: No focal suspicious lytic or sclerotic bone lesion. No acute bony
abnormality.

## 2019-04-13 ENCOUNTER — Encounter: Payer: Self-pay | Admitting: Nurse Practitioner

## 2019-04-13 ENCOUNTER — Other Ambulatory Visit: Payer: Self-pay

## 2019-04-13 ENCOUNTER — Inpatient Hospital Stay: Payer: PPO

## 2019-04-13 ENCOUNTER — Inpatient Hospital Stay: Payer: PPO | Attending: Nurse Practitioner | Admitting: Nurse Practitioner

## 2019-04-13 VITALS — BP 155/60 | HR 65 | Temp 97.2°F | Resp 18 | Ht 61.0 in | Wt 148.8 lb

## 2019-04-13 DIAGNOSIS — Z170 Estrogen receptor positive status [ER+]: Secondary | ICD-10-CM | POA: Diagnosis not present

## 2019-04-13 DIAGNOSIS — C50412 Malignant neoplasm of upper-outer quadrant of left female breast: Secondary | ICD-10-CM

## 2019-04-13 DIAGNOSIS — N189 Chronic kidney disease, unspecified: Secondary | ICD-10-CM

## 2019-04-13 DIAGNOSIS — F17210 Nicotine dependence, cigarettes, uncomplicated: Secondary | ICD-10-CM | POA: Insufficient documentation

## 2019-04-13 DIAGNOSIS — D631 Anemia in chronic kidney disease: Secondary | ICD-10-CM | POA: Diagnosis not present

## 2019-04-13 DIAGNOSIS — M8580 Other specified disorders of bone density and structure, unspecified site: Secondary | ICD-10-CM | POA: Insufficient documentation

## 2019-04-13 DIAGNOSIS — Z79810 Long term (current) use of selective estrogen receptor modulators (SERMs): Secondary | ICD-10-CM | POA: Diagnosis not present

## 2019-04-13 DIAGNOSIS — E538 Deficiency of other specified B group vitamins: Secondary | ICD-10-CM

## 2019-04-13 DIAGNOSIS — Z79899 Other long term (current) drug therapy: Secondary | ICD-10-CM | POA: Diagnosis not present

## 2019-04-13 DIAGNOSIS — M8588 Other specified disorders of bone density and structure, other site: Secondary | ICD-10-CM

## 2019-04-13 DIAGNOSIS — I129 Hypertensive chronic kidney disease with stage 1 through stage 4 chronic kidney disease, or unspecified chronic kidney disease: Secondary | ICD-10-CM | POA: Insufficient documentation

## 2019-04-13 DIAGNOSIS — E222 Syndrome of inappropriate secretion of antidiuretic hormone: Secondary | ICD-10-CM | POA: Insufficient documentation

## 2019-04-13 DIAGNOSIS — N184 Chronic kidney disease, stage 4 (severe): Secondary | ICD-10-CM | POA: Insufficient documentation

## 2019-04-13 LAB — CBC WITH DIFFERENTIAL/PLATELET
Abs Immature Granulocytes: 0.18 10*3/uL — ABNORMAL HIGH (ref 0.00–0.07)
Basophils Absolute: 0.1 10*3/uL (ref 0.0–0.1)
Basophils Relative: 1 %
Eosinophils Absolute: 0.2 10*3/uL (ref 0.0–0.5)
Eosinophils Relative: 4 %
HCT: 30.5 % — ABNORMAL LOW (ref 36.0–46.0)
Hemoglobin: 10.2 g/dL — ABNORMAL LOW (ref 12.0–15.0)
Immature Granulocytes: 3 %
Lymphocytes Relative: 21 %
Lymphs Abs: 1.1 10*3/uL (ref 0.7–4.0)
MCH: 33.9 pg (ref 26.0–34.0)
MCHC: 33.4 g/dL (ref 30.0–36.0)
MCV: 101.3 fL — ABNORMAL HIGH (ref 80.0–100.0)
Monocytes Absolute: 0.8 10*3/uL (ref 0.1–1.0)
Monocytes Relative: 14 %
Neutro Abs: 3.1 10*3/uL (ref 1.7–7.7)
Neutrophils Relative %: 57 %
Platelets: 244 10*3/uL (ref 150–400)
RBC: 3.01 MIL/uL — ABNORMAL LOW (ref 3.87–5.11)
RDW: 12.5 % (ref 11.5–15.5)
WBC: 5.5 10*3/uL (ref 4.0–10.5)
nRBC: 0 % (ref 0.0–0.2)

## 2019-04-13 LAB — COMPREHENSIVE METABOLIC PANEL
ALT: 12 U/L (ref 0–44)
AST: 19 U/L (ref 15–41)
Albumin: 3.8 g/dL (ref 3.5–5.0)
Alkaline Phosphatase: 54 U/L (ref 38–126)
Anion gap: 6 (ref 5–15)
BUN: 20 mg/dL (ref 8–23)
CO2: 25 mmol/L (ref 22–32)
Calcium: 8.8 mg/dL — ABNORMAL LOW (ref 8.9–10.3)
Chloride: 101 mmol/L (ref 98–111)
Creatinine, Ser: 1.44 mg/dL — ABNORMAL HIGH (ref 0.44–1.00)
GFR calc Af Amer: 41 mL/min — ABNORMAL LOW (ref >60)
GFR calc non Af Amer: 36 mL/min — ABNORMAL LOW (ref >60)
Glucose, Bld: 101 mg/dL — ABNORMAL HIGH (ref 70–99)
Potassium: 4.1 mmol/L (ref 3.5–5.1)
Sodium: 132 mmol/L — ABNORMAL LOW (ref 135–145)
Total Bilirubin: 0.2 mg/dL — ABNORMAL LOW (ref 0.3–1.2)
Total Protein: 8 g/dL (ref 6.5–8.1)

## 2019-04-13 NOTE — Progress Notes (Signed)
No new changes noted today 

## 2019-04-13 NOTE — Progress Notes (Signed)
[facility]  [address], [address] [place], [place] [address] Phone: [phone]  Fax: [phone]   Clinic Day:  04/13/2019  Referring physician: Sofie Hartigan, MD  Chief Complaint: Bianca Shaw is a 73 y.o. female with stage IIA left breast cancer, currently on tamoxifen, anemia of chronic renal disease, and electrolytes abnormalities (hyponatremia and hypercalcemia) who is seen for 6 month assessment.  HPI: Patient was last seen by Dr. Mike Gip on 10/12/2018.  Dr. Kem Parkinson previous note reviewed in preparation for today's appointment.  At that time CA 27-29 was normal.  In the interim, she has had a follow-up mammogram.  She continues tamoxifen.  Hemoglobin was stable at 9.9.  Creatinine had improved and Procrit was held.  She continues Fosamax.  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  Calcium had improved to 8.8/corrected 8.97.  She continues oral B12.  Today, she reports feeling well and denies specific complaints.  No new lumps or bumps.  Fatigue is stable and unchanged.  She continues to increase dietary salt intake.  No easy bleeding or bruising.  No fevers or night sweats.  Denies abdominal pain.  She denies black or bloody stools.    Past Medical History:  Diagnosis Date  . Anemia   . Anxiety disorder   . Breast cancer ([place]) 12/03/2015   lt breast  . Breast cancer of upper-outer quadrant of left female breast ([place]) 11/2015   pT2 pN0(i+).;ER+; PR +, her 2 neu not overexpressed.  Mastectomy, SLN, Mammoprint: Low risk.   . Cancer ([place]) 12/03/2015   left breast/ INVASIVE LOBULAR CARCINOMA.   . Chronic kidney disease    STAGE 3  . COPD (chronic obstructive pulmonary disease) (HCC)    MILD  . Cough    lingering, mild, finished Prednisone and anitbiotic 11/03/15  . Dyspnea    DOE  . Family history of adverse reaction to anesthesia    sister - PONV  . GERD (gastroesophageal reflux disease)   . Hypertension   . Myocardial  infarction ([place])   . Osteopenia   . Osteoporosis   . Pericarditis    diagnonsed June, 2010, unclear etiology as of yer  . Personal history of tobacco use, presenting hazards to health 10/31/2015  . Scleroderma (HCC)    ONLY ON SKIN-MILD  . UTI (lower urinary tract infection)   . Wears dentures    full upper    Past Surgical History:  Procedure Laterality Date  . BREAST BIOPSY Right 2012   core - neg  . BREAST BIOPSY Left 12/03/2015   INVASIVE LOBULAR CARCINOMA.   Marland Kitchen CARDIAC CATHETERIZATION    . CATARACT EXTRACTION W/ INTRAOCULAR LENS IMPLANT Right   . CATARACT EXTRACTION W/PHACO Left 11/17/2018   Procedure: CATARACT EXTRACTION PHACO AND INTRAOCULAR LENS PLACEMENT;  Surgeon: Marchia Meiers, MD;  Location: [facility];  Service: Ophthalmology;  Laterality: Left;  [place] 00:55 CDE 11.04 Fluid Pack Lot # [id number]  . COLONOSCOPY WITH PROPOFOL N/A 11/08/2015   Procedure: COLONOSCOPY WITH PROPOFOL;  Surgeon: Lucilla Lame, MD;  Location: [place];  Service: Endoscopy;  Laterality: N/A;  . CORONARY ANGIOPLASTY    . ESOPHAGOGASTRODUODENOSCOPY (EGD) WITH PROPOFOL N/A 11/18/2017   Procedure: ESOPHAGOGASTRODUODENOSCOPY (EGD) WITH PROPOFOL;  Surgeon: Virgel Manifold, MD;  Location: [facility];  Service: Endoscopy;  Laterality: N/A;  . ESOPHAGOGASTRODUODENOSCOPY (EGD) WITH PROPOFOL N/A 02/15/2018   Procedure: ESOPHAGOGASTRODUODENOSCOPY (EGD) WITH PROPOFOL;  Surgeon: Jonathon Bellows, MD;  Location: [facility] ENDOSCOPY;  Service: Gastroenterology;  Laterality: N/A;  . ESOPHAGOGASTRODUODENOSCOPY (EGD) WITH PROPOFOL N/A 05/10/2018   Procedure: ESOPHAGOGASTRODUODENOSCOPY (EGD) WITH BIOPSIES;  Surgeon: Virgel Manifold, MD;  Location: [place];  Service: Endoscopy;  Laterality: N/A;  . EVACUATION BREAST HEMATOMA Left 01/14/2016   Procedure: EVACUATION HEMATOMA BREAST;  Surgeon: Robert Bellow, MD;  Location: [facility];  Service: General;  Laterality: Left;  . EYE SURGERY    . MASTECTOMY  Left 2017   complete mastectomy  . MASTECTOMY W/ SENTINEL NODE BIOPSY Left 12/26/2015   Procedure: MASTECTOMY WITH SENTINEL LYMPH NODE BIOPSY;  Surgeon: Robert Bellow, MD;  Location: [facility];  Service: General;  Laterality: Left;  . RIGHT/LEFT HEART CATH AND CORONARY ANGIOGRAPHY N/A 07/22/2016   Procedure: Right/Left Heart Cath and Coronary Angiography;  Surgeon: Minna Merritts, MD;  Location: [place] CV LAB;  Service: Cardiovascular;  Laterality: N/A;  . TUBAL LIGATION    . VESICOVAGINAL FISTULA CLOSURE W/ TAH      Family History  Problem Relation Age of Onset  . Heart failure Mother   . Epilepsy Mother   . COPD Father   . Heart disease Father   . Anxiety disorder Sister   . Arthritis Brother   . Heart disease Brother   . Vaginal cancer Paternal Grandmother   . Heart attack Paternal Grandfather   . COPD Brother   . Kidney failure Brother   . COPD Brother   . Arthritis Sister   . Uterine cancer Other   . Diabetes Other   . Colon cancer Neg Hx   . Stomach cancer Neg Hx   . Breast cancer Neg Hx     Social History:  reports that she quit smoking about 15 years ago. Her smoking use included cigarettes. She has a 30.00 pack-year smoking history. She has never used smokeless tobacco. She reports current alcohol use of about 1.0 - 3.0 standard drinks of alcohol per week. She reports that she does not use drugs. She has 5 brothers and 2 sisters.  She has 2 children who are alive and well.  She lives in [place] with her husband, Pat Patrick.    Allergies:  Allergies  Allergen Reactions  . Pimenta Nausea And Vomiting  . Tomato Rash    Current Medications: Current Outpatient Medications  Medication Sig Dispense Refill  . amLODipine (NORVASC) 2.5 MG tablet Take 1 tablet (2.5 mg total) by mouth daily. 30 tablet 0  . Cyanocobalamin (B-12) 5000 MCG CAPS Take 5,000 mcg by mouth daily.    . furosemide (LASIX) 20 MG tablet Take 20 mg by mouth daily.     Marland Kitchen GARLIC PO Take 1 tablet by  mouth daily.    . hydrALAZINE (APRESOLINE) 25 MG tablet Take 1 tablet (25 mg total) by mouth 2 (two) times daily. 60 tablet 11  . KRILL OIL PO Take 1 capsule by mouth daily.    Marland Kitchen lisinopril (PRINIVIL,ZESTRIL) 20 MG tablet Take 1 tablet (20 mg total) by mouth daily. 30 tablet 2  . metoprolol succinate (TOPROL-XL) 25 MG 24 hr tablet Take 1 tablet (25 mg total) by mouth daily. 90 tablet 3  . potassium chloride SA (K-DUR,KLOR-CON) 20 MEQ tablet Take 1 tablet (20 mEq total) by mouth daily. (Patient taking differently: Take 20 mEq by mouth 2 (two) times daily. ) 90 tablet 2  . simvastatin (ZOCOR) 40 MG tablet Take 40 mg by mouth daily.    . sodium chloride 1 g tablet Take 1 g by mouth daily.    Marland Kitchen  tamoxifen (NOLVADEX) 20 MG tablet Take 1 tablet (20 mg total) by mouth daily. 90 tablet 0  . albuterol (PROVENTIL) (2.5 MG/3ML) 0.083% nebulizer solution Take 3 mLs (2.5 mg total) by nebulization every 6 (six) hours as needed for wheezing or shortness of breath. (Patient not taking: Reported on 11/17/2018) 75 mL 12  . fluticasone (FLONASE) 50 MCG/ACT nasal spray Place 2 sprays into both nostrils daily. (Patient not taking: Reported on 11/17/2018) 16 g 0  . nitroGLYCERIN (NITROSTAT) 0.4 MG SL tablet Place 1 tablet (0.4 mg total) under the tongue every 5 (five) minutes as needed for chest pain. (Patient not taking: Reported on 04/13/2019) 30 tablet 0  . ondansetron (ZOFRAN ODT) 4 MG disintegrating tablet Take 1 tablet (4 mg total) by mouth every 8 (eight) hours as needed. (Patient not taking: Reported on 10/12/2018) 20 tablet 1   No current facility-administered medications for this visit.    Review of Systems  Constitutional: Negative.  Negative for chills, diaphoresis, fever, malaise/fatigue and weight loss (up 10 pounds).       Unaccompanied.  Wearing mask.  HENT: Negative.  Negative for congestion, ear pain, nosebleeds, sinus pain, sore throat and tinnitus.   Eyes: Positive for blurred vision (cataract in left  eye). Negative for photophobia and pain.  Respiratory: Negative.  Negative for hemoptysis, sputum production and shortness of breath.   Cardiovascular: Negative.  Negative for chest pain, palpitations, orthopnea, leg swelling and PND.  Gastrointestinal: Negative for abdominal pain, blood in stool, constipation, diarrhea, heartburn, melena, nausea and vomiting.       History of duodenal ulcer. Last colonoscopy 2017, negative.  Genitourinary: Negative.  Negative for dysuria, frequency, hematuria and urgency.  Musculoskeletal: Negative.  Negative for back pain, falls, joint pain, myalgias and neck pain.       Osteoporosis.  Skin: Negative.  Negative for itching and rash.       Scleroderma.  Neurological: Negative.  Negative for dizziness, tremors, sensory change, speech change, focal weakness, weakness and headaches.  Endo/Heme/Allergies: Negative.  Does not bruise/bleed easily.  Psychiatric/Behavioral: Positive for memory loss (Unchanged and mild per patient). Negative for depression. The patient is not nervous/anxious and does not have insomnia.   All other systems reviewed and are negative.  Performance status (ECOG): 0  Vitals: Today's Vitals   04/13/19 1111 04/13/19 1132 04/13/19 1133  BP:  (!) 155/60   Pulse:  65   Resp:  18   Temp:  (!) 97.2 F (36.2 C)   TempSrc:  Tympanic   SpO2:  100%   Weight:  148 lb 13 oz (67.5 kg)   Height:  '5\' 1"'  (1.549 m)   PainSc: 0-No pain 0-No pain 0-No pain   Body mass index is 28.12 kg/m.   Physical Exam  Constitutional: She is oriented to person, place, and time. She appears well-developed and well-nourished. No distress.  Unaccompanied.  Wearing mask.  HENT:  Head: Normocephalic and atraumatic.  Mouth/Throat: Oropharynx is clear and moist. No oropharyngeal exudate.  Short brown graying hair. Mask.  Eyes: Pupils are equal, round, and reactive to light. Conjunctivae and EOM are normal. No scleral icterus.  Blue eyes.  Cardiovascular:  Normal rate, regular rhythm and normal heart sounds.  No murmur heard. Pulmonary/Chest: Effort normal and breath sounds normal. No respiratory distress. She has no wheezes. Right breast exhibits skin change. Breast asymmetry: left mastectomy.  Patient declined breast exam  Abdominal: Soft. Bowel sounds are normal. She exhibits no distension. There is no abdominal tenderness.  Musculoskeletal:        General: Edema (trace lower extremity) present. Normal range of motion.     Cervical back: Normal range of motion and neck supple.  Lymphadenopathy:    She has no cervical adenopathy.    She has no axillary adenopathy.       Right: No supraclavicular adenopathy present.       Left: No supraclavicular adenopathy present.  Neurological: She is alert and oriented to person, place, and time.  Skin: Skin is warm and dry. She is not diaphoretic.  Psychiatric: She has a normal mood and affect. Her behavior is normal. Judgment and thought content normal.  Nursing note and vitals reviewed.  Imaging studies: 11/21/2015:  Left mammogram and ultrasound revealed a 4.2 x 2.4 x 2.6 cm irregular hypoechoic mass in the 1-2 o'clock position in the left breast 3 cm from the nipple.  Left axillary lymph nodes appeared normal.   12/25/2015:  Breast MRI revealed a 6.2 cm area of abnormal enhancement in the upper outer quadrant of the left breast.  The right breast revealed no mass or abnormal enhancement. 12/08/2016:  Right mammogram revealed no evidence of malignancy. 06/04/2017:  Chest CT revealed no pneumonia, edema, collapse or effusion. There was a right middle lobe calcified granuloma.There was a stable 8 mm scar posterior aspect of the right upper lobe. 06/11/2017:  Abdomen and pelvic CT revealed no acute process in the abdomen or pelvis. There was possible bladder wall thickening (? under distention). There was nonspecific presacral and posterior pelvic edema. There was central uterine hypoattenuation  (subtle). 06/16/2017:  Head MRI without contrast revealed no acute intracranial abnormality. There was wide spread changes c/w advanced small vessel disease.  06/22/2017:  Bone survey revealed no focal lytic or sclerotic lesion. 08/23/2017: RIGHT diagnostic mammogram and breast ultrasound on 08/23/2017 revealed no mammographic or sonographic evidence of malignancy in the RIGHT breast. Patient has heterogeneously dense breast tissue (ACR  breast density category C).  10/14/2018-mammogram screening breast right revealed no mammographic evidence of malignancy.  Reported as BI-RADS Category 1: Negative.  Plan to repeat in 1 year   Appointment on 04/13/2019  Component Date Value Ref Range Status  . WBC 04/13/2019 5.5  4.0 - 10.5 K/uL Final  . RBC 04/13/2019 3.01* 3.87 - 5.11 MIL/uL Final  . Hemoglobin 04/13/2019 10.2* 12.0 - 15.0 g/dL Final  . HCT 04/13/2019 30.5* 36.0 - 46.0 % Final  . MCV 04/13/2019 101.3* 80.0 - 100.0 fL Final  . MCH 04/13/2019 33.9  26.0 - 34.0 pg Final  . MCHC 04/13/2019 33.4  30.0 - 36.0 g/dL Final  . RDW 04/13/2019 12.5  11.5 - 15.5 % Final  . Platelets 04/13/2019 244  150 - 400 K/uL Final  . nRBC 04/13/2019 0.0  0.0 - 0.2 % Final  . Neutrophils Relative % 04/13/2019 57  % Final  . Neutro Abs 04/13/2019 3.1  1.7 - 7.7 K/uL Final  . Lymphocytes Relative 04/13/2019 21  % Final  . Lymphs Abs 04/13/2019 1.1  0.7 - 4.0 K/uL Final  . Monocytes Relative 04/13/2019 14  % Final  . Monocytes Absolute 04/13/2019 0.8  0.1 - 1.0 K/uL Final  . Eosinophils Relative 04/13/2019 4  % Final  . Eosinophils Absolute 04/13/2019 0.2  0.0 - 0.5 K/uL Final  . Basophils Relative 04/13/2019 1  % Final  . Basophils Absolute 04/13/2019 0.1  0.0 - 0.1 K/uL Final  . Immature Granulocytes 04/13/2019 3  % Final  . Abs Immature  Granulocytes 04/13/2019 0.18* 0.00 - 0.07 K/uL Final   Performed at [facility], [address] [place], [place] [address]    Assessment:  Bianca Shaw  is a 73 y.o. female with stage IIA (T2N0) left breast cancer s/p mastectomy with sentinel lymph node biopsy on 12/26/2015.   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  Margins were negative.  Two sentinel lymph nodes were negative for macrometastasis, but with isolated tumor cells on IHC stains.  Three additional lymph nodes were positive for isolated tumor cells on IHC.  Tumor was ER positive (> 90%), PR positive (> 90%), and Her2/neu 2+ (eqivocal).  Her2/neu by FISH was negative.  Pathologic stage was pT2 pN0(i+).  MammaPrint testing revealed low risk luminal type A. There was a 97.8% probability of being disease free at 10 years with hormonal therapy.   Right mammogram on 12/08/2016 revealed no evidence of malignancy. RIGHT diagnostic mammogram and breast ultrasound on 08/23/2017 revealed no mammographic or sonographic evidence of malignancy in the RIGHT breast. Patient has heterogeneously dense breast tissue (ACR  breast density category C).   Exam in 11/2016 revealed a 4 mm nodule in the left axillae above the mastectomy incision.  Excision biopsy on 12/15/2016 revealed fat necrosis and no malignancy.  Imaging studies (chest, abdomen, and pelvic CT, head MRI, and bone survey) in 05/2017 revealed no evidence of metastatic disease.  She began tamoxifen on 02/04/2016.    Historically, has tolerated it well.  CA27.29 has been followed: 27.3 on 12/10/2015, 19.4 on 05/30/2016, 16.4 on 09/07/2016, 17.2 on 12/08/2016, 21.3 on 04/09/2017, 17.6 on 08/12/2017, 18.8 on 12/14/2017, 17.2 on 06/16/2018, and 17.1 on 10/12/2018.   Bone density on 11/05/2015 revealed osteoporosis with a T score of -2.9 in the AP spine L1-L2 and -1.9 in the left femoral neck.  Bone density on 11/08/2017 revealed osteopenia with a T-score of -2.0  in the AP spine and -1.8 in the LEFT femoral neck. She started Fosamax in 10/2015.  She is on calcium and vitamin D.  She  had a colonoscopy in 09/2015.  She denies any melena, hematochezia, hematuria or vaginal bleeding.  She began oral B12 in early 03/2017.  B12 was 295 on 06/09/2016, 299 on 12/08/2016, 1585 on 05/24/2017, 212 on 03/08/2018, and 354 on 06/16/2018.  Folate was 11.5 on 04/09/2017 and 9.0 on 03/08/2018.  EGD on 11/18/2017 revealed 3 to 4 non-bleeding ulcers, with the largest measuring 8 mm. There was no centralized bleeding, however there was mild oozing noted at the ulcer edges. Pathology revealed active gastritis with no evidence of high grade dysplasia or malignancy. IHC testing (+) for H. pylori.   EGD on 02/15/2018 that demonstrated a single non-bleeding duodenal ulcer measuring 6 mm in largest dimension.  There were mucosal changes noted in the gastric antrum.  Pathology revealed chronic active gastritis.  There was villous atrophy with marked active inflammation noted in the specimen taken from the duodenal bulb.  Nodular mucosa of the gastric body revealed one tiny fragment with intestinal metaplasia.  EGD on 05/10/2018 by revealed a Z-line irregular. There was erythematous mucosa in the antrum. There was a normal duodenal bulb, second portion of the duodenum and examined duodenum. Additional biopsies were obtained on the greater curvature of the gastric body, on the lesser curvature of the gastric body, at the incisura, on the greater curvature of the gastric antrum and on the lesser curvature of the gastric antrum.  Gastric mapping  biopsies did not show any dysplasia.  EGD with biopsies was recommended in 2 years to follow these changes over time.   She has a normocytic anemia. She has anemia of chronic renal disease, stage IV.  Work-up on 12/08/2016 and 04/09/2017 revealed a low normal B12 with normal MMA thus r/o B12 deficiency.  SPEP revealed no monoclonal protein. Free light chain ratio was 2.43 (0.26-1.65), unclear significance.  Ferritin, iron saturation,andfolate were normal.TSH is  normal. Retic was 1.2% (inappropriately low) on 09/07/2016 and 1.1% on 06/23/2017. Coombs was negative on 10/19/2016 and 06/24/2017.  Creatinine was 1.41 (CrCl 36 ml/min) on 06/29/2017 and 1.94 (CrCl 19.5 ml/min) on 02/23/2018. She receives Procrit (last 09/22/2018).  24 hour urine (IFE) on 12/28/2016 revealed no monoclonal protein. 24 hour urine on 06/23/2017 revealed kappa free light chains 157 (1.35-24.19), lambda free light chains 6.99 (0.24-6.66), and free light chain ratio 22.46 (2.04-10.37).  Kappa free light chains were 159.9 (ratio 2.79) on 06/23/2017.  SPEP was normal on 06/23/2017.  Bone marrow aspirate and biopsy on 07/07/2017 revealed a variably cellular bone marrow with trilineage hematopoiesis.  There was slight plasmacytosis (4%).  Plasma cells were polyclonal for kappa and lambda light chains.  There were several lymphoid aggregates.  Congo stain for amyloid was negative.  Flow cytometry revealed no monoclonal B-cell population or abnormal T-cell population.  Cytogenetics were normal (68, XX).  She has a history of chronic hyponatremia(dating back to 07/2012). She is followed by nephrology.  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  She is on fluid restriction. Cortisol and uric acid were normal on 06/24/2017.   She has scleroderma.  She has skin thickening at sites of abrasion.  She has hypercalcemia felt related to thiazide diuretic and excess calcium intake.  She was seen by endocrinology.  PTH was 14 (low) and not c/w primary hyperparathyroidism.  TSH was normal. PTH-rp on 06/23/2017 was < 2.0.  ACE level was 47 (normal) on 06/23/2017.  Calcitriol (vitamin D 1, 25 dihydroxy) was 75.3 (19.9-79.3).  Vitamin D, 25 hydroxy was 68.6 (30-100) on 06/22/2017.  She denies excess vitamin A and vitamin D intake.   Symptomatically, she is stable and doing well.  Plan: 1.  Labs today were reviewed 2.  Stage IIA left breast cancer Clinically, no evidence of recurrence Last  mammogram on 10/13/2018 was negative for malignancy in right breast Plan to repeat at right breast mammogram in June 2021 Continue tamoxifen CA 27-29 pending 3.  Anemia of chronic renal disease Hemoglobin 10.2 hematocrit 30.5 - Improved. Ferritin was 55 in October 2020- nomal Creatinine 1.44.  Worse. Continue to hold Procrit.  If hemoglobin worsens consider restarting. Continue to monitor 4.  Osteopenia Last bone density screening on 11/08/2017 reported T score -2 consistent with osteopenia Off fosamax per Dr. Jefm Bryant for below.  Continue calcium and vitamin D supplementation along with weightbearing exercise as tolerated Plan to repeat bone density screening in 10/2019 5.  SIADH Sodium is 132 today Intolerant to sodium tablets Continue follow-up with Dr. Holley Raring & Dr. Gabriel Carina 6.  HYPERcalcemia  Calcium is 8.8 today Resolved.  Followed by Dr. Gabriel Carina & Dr. Holley Raring Continue to monitor. 7.   B12 deficiency             B12 was 2132 in June 2020  Currently on oral B12 supplementation.  Continue.  Plan to repeat B12 and folate levels in 6 months Disposition Return to clinic in 6 months for labs (Cbc, cmp, b12, folate, vit d, ca 27.29, ferritin &  iron studies) and MD.  Repeat mammogram June 2021. Repeat bone density screening 10/2019.   I discussed the assessment and treatment plan with the patient.  The patient was provided an opportunity to ask questions and all were answered.  The patient agreed with the plan and demonstrated an understanding of the instructions.  The patient was advised to call back if the symptoms worsen or if the condition fails to improve as anticipated.  I provided 15 minutes of face-to-face time during this this encounter and > 50% was spent counseling as documented under my assessment and plan.   Thank you for allowing me to participate in the care of your very pleasant patient  Beckey Rutter, DNP, [facility] [place] at [facility] [phone]  (clinic)  CC: Dr. Mike Gip

## 2019-04-14 LAB — CANCER ANTIGEN 27.29: CA 27.29: 16.6 U/mL (ref 0.0–38.6)

## 2019-05-15 ENCOUNTER — Other Ambulatory Visit: Payer: Self-pay | Admitting: Cardiovascular Disease

## 2019-06-03 NOTE — Progress Notes (Signed)
Cardiology Office Note  Date:  06/05/2019   ID:  Bianca Shaw, DOB 01-21-1946, MRN [id number]  PCP:  Sofie Hartigan, MD   Chief Complaint  Patient presents with  . office visit    R leg pain. swelling in the ankles. Meds verbally reviewed w/ pt.    HPI:  Bianca Shaw is a 74 year old woman with history of  smoking,1 ppd,  40 years, COPD breast cancer on the left with mastectomy, no XRT hyponatremia, low potassium hypertension  in hospital  troponin 1.47 in the setting of left-sided chest pain on 07/15/2016.  outpt cardiac cath , no CAD, demand ischemia secondary to N/V, calcified LAD CT scan chest  Hx of low sodium significant coronary calcifications particularly in the LAD, and aorta She presents for follow-up of her chest pain  Doing well, Being careful with virus Not going out very much  BP at home: 125 at home Blood pressure elevated here today, anxious to be in the doctor's office and walk a long way to get into the office  Active in garden, in the summer Not much in the winter No exercise program in particular  Denies any significant chest pain symptoms, shortness of breath on exertion  Lab work reviewed with her Sodium 132, stable number CR 1.44  EKG personally reviewed by myself on todays visit NSR rate 54 bpm, no st or t wave changes  Other past medical hx reviewed  echocardiogram and cardiac catheterization results, CT scan   Previous hospitalization March 2018 Stressors included dry heaves, nausea for 3-4 days prior to admission  left side chest pain, SOB significant diarrhea, potassium 2.9, sodium 123  CT scan chest in the hospital significant coronary calcifications particularly in the LAD, and aorta  07/16/16 Left ventricle: ejection fraction was in the range of 60% to 65%. Wall motion was normal Mitral valve: There was mild to moderate regurgitation. Left atrium: The atrium was mildly dilated. Pulmonary arteries: PA peak pressure: 53  mm Hg (S).  Cath 07/22/16 No hemodynamically significant stenoses Normal right heart pressures Diffuse calcified plaque, predominantly in the LAD Normal LV function ejection fraction greater than 55%    PMH:   has a past medical history of Anemia, Anxiety disorder, Breast cancer ([place]) (12/03/2015), Breast cancer of upper-outer quadrant of left female breast ([place]) (11/2015), Cancer ([place]) (12/03/2015), Chronic kidney disease, COPD (chronic obstructive pulmonary disease) ([place]), Cough, Dyspnea, Family history of adverse reaction to anesthesia, GERD (gastroesophageal reflux disease), Hypertension, Myocardial infarction ([place]), Osteopenia, Osteoporosis, Pericarditis, Personal history of tobacco use, presenting hazards to health (10/31/2015), Scleroderma ([place]), UTI (lower urinary tract infection), and Wears dentures.  PSH:    Past Surgical History:  Procedure Laterality Date  . BREAST BIOPSY Right 2012   core - neg  . BREAST BIOPSY Left 12/03/2015   INVASIVE LOBULAR CARCINOMA.   Marland Kitchen CARDIAC CATHETERIZATION    . CATARACT EXTRACTION W/ INTRAOCULAR LENS IMPLANT Right   . CATARACT EXTRACTION W/PHACO Left 11/17/2018   Procedure: CATARACT EXTRACTION PHACO AND INTRAOCULAR LENS PLACEMENT;  Surgeon: Marchia Meiers, MD;  Location: [facility];  Service: Ophthalmology;  Laterality: Left;  [place] 00:55 CDE 11.04 Fluid Pack Lot # [id number]  . COLONOSCOPY WITH PROPOFOL N/A 11/08/2015   Procedure: COLONOSCOPY WITH PROPOFOL;  Surgeon: Lucilla Lame, MD;  Location: [facility];  Service: Endoscopy;  Laterality: N/A;  . CORONARY ANGIOPLASTY    . ESOPHAGOGASTRODUODENOSCOPY (EGD) WITH PROPOFOL N/A 11/18/2017   Procedure: ESOPHAGOGASTRODUODENOSCOPY (EGD) WITH PROPOFOL;  Surgeon: Virgel Manifold, MD;  Location: [facility];  Service: Endoscopy;  Laterality: N/A;  . ESOPHAGOGASTRODUODENOSCOPY (EGD) WITH PROPOFOL N/A 02/15/2018   Procedure: ESOPHAGOGASTRODUODENOSCOPY (EGD) WITH PROPOFOL;  Surgeon: Jonathon Bellows, MD;   Location: [facility];  Service: Gastroenterology;  Laterality: N/A;  . ESOPHAGOGASTRODUODENOSCOPY (EGD) WITH PROPOFOL N/A 05/10/2018   Procedure: ESOPHAGOGASTRODUODENOSCOPY (EGD) WITH BIOPSIES;  Surgeon: Virgel Manifold, MD;  Location: [facility];  Service: Endoscopy;  Laterality: N/A;  . EVACUATION BREAST HEMATOMA Left 01/14/2016   Procedure: EVACUATION HEMATOMA BREAST;  Surgeon: Robert Bellow, MD;  Location: [facility];  Service: General;  Laterality: Left;  . EYE SURGERY    . MASTECTOMY Left 2017   complete mastectomy  . MASTECTOMY W/ SENTINEL NODE BIOPSY Left 12/26/2015   Procedure: MASTECTOMY WITH SENTINEL LYMPH NODE BIOPSY;  Surgeon: Robert Bellow, MD;  Location: [facility];  Service: General;  Laterality: Left;  . RIGHT/LEFT HEART CATH AND CORONARY ANGIOGRAPHY N/A 07/22/2016   Procedure: Right/Left Heart Cath and Coronary Angiography;  Surgeon: Minna Merritts, MD;  Location: [facility];  Service: Cardiovascular;  Laterality: N/A;  . TUBAL LIGATION    . VESICOVAGINAL FISTULA CLOSURE W/ TAH      Current Outpatient Medications  Medication Sig Dispense Refill  . albuterol (PROVENTIL) (2.5 MG/3ML) 0.083% nebulizer solution Take 3 mLs (2.5 mg total) by nebulization every 6 (six) hours as needed for wheezing or shortness of breath. 75 mL 12  . amLODipine (NORVASC) 2.5 MG tablet Take 1 tablet (2.5 mg total) by mouth daily. 30 tablet 0  . Cyanocobalamin (B-12) 5000 MCG CAPS Take 5,000 mcg by mouth daily.    . Ferrous Sulfate (IRON PO) Take 65 mg of iron by mouth. Taking 1 tablet daily    . fluticasone (FLONASE) 50 MCG/ACT nasal spray Place 2 sprays into both nostrils daily. 16 g 0  . furosemide (LASIX) 20 MG tablet Take 20 mg by mouth daily.     Marland Kitchen GARLIC PO Take 1 tablet by mouth daily.    . hydrALAZINE (APRESOLINE) 25 MG tablet Take 1 tablet (25 mg total) by mouth 2 (two) times daily. 60 tablet 11  . KRILL OIL PO Take 1 capsule by mouth daily.    Marland Kitchen lisinopril  (PRINIVIL,ZESTRIL) 20 MG tablet Take 1 tablet (20 mg total) by mouth daily. 30 tablet 2  . metoprolol succinate (TOPROL-XL) 25 MG 24 hr tablet Take 1 tablet (25 mg total) by mouth daily. 90 tablet 3  . nitroGLYCERIN (NITROSTAT) 0.4 MG SL tablet Place 1 tablet (0.4 mg total) under the tongue every 5 (five) minutes as needed for chest pain. 30 tablet 0  . ondansetron (ZOFRAN ODT) 4 MG disintegrating tablet Take 1 tablet (4 mg total) by mouth every 8 (eight) hours as needed. 20 tablet 1  . potassium chloride SA (K-DUR,KLOR-CON) 20 MEQ tablet Take 1 tablet (20 mEq total) by mouth daily. (Patient taking differently: Take 20 mEq by mouth 2 (two) times daily. ) 90 tablet 2  . simvastatin (ZOCOR) 40 MG tablet Take 1 tablet (40 mg total) by mouth daily. 30 tablet 1  . sodium chloride 1 g tablet Take 1 g by mouth daily.    . tamoxifen (NOLVADEX) 20 MG tablet Take 1 tablet (20 mg total) by mouth daily. 90 tablet 0   No current facility-administered medications for this visit.     Allergies:   Pimenta and Tomato   Social History:  The patient  reports that she quit smoking about 15 years ago. Her  smoking use included cigarettes. She has a 30.00 pack-year smoking history. She has never used smokeless tobacco. She reports current alcohol use of about 1.0 - 3.0 standard drinks of alcohol per week. She reports that she does not use drugs.   Family History:   family history includes Anxiety disorder in her sister; Arthritis in her brother and sister; COPD in her brother, brother, and father; Diabetes in an other family member; Epilepsy in her mother; Heart attack in her paternal grandfather; Heart disease in her brother and father; Heart failure in her mother; Kidney failure in her brother; Uterine cancer in an other family member; Vaginal cancer in her paternal grandmother.    Review of Systems: Review of Systems  Constitutional: Negative.   Respiratory: Negative.   Cardiovascular: Negative.    Gastrointestinal: Negative.   Musculoskeletal: Negative.   Neurological: Negative.   Psychiatric/Behavioral: Negative.   All other systems reviewed and are negative.   PHYSICAL EXAM: VS:  BP (!) 150/72 (BP Location: Right Arm, Patient Position: Sitting, Cuff Size: Normal)   Pulse (!) 54   Ht 5\' 1"  (1.549 m)   Wt 149 lb 8 oz (67.8 kg)   SpO2 98%   BMI 28.25 kg/m  , BMI Body mass index is 28.25 kg/m. Constitutional:  oriented to person, place, and time. No distress.  HENT:  Head: Grossly normal Eyes:  no discharge. No scleral icterus.  Neck: No JVD, no carotid bruits  Cardiovascular: Regular rate and rhythm, no murmurs appreciated Pulmonary/Chest: Clear to auscultation bilaterally, no wheezes or rails Abdominal: Soft.  no distension.  no tenderness.  Musculoskeletal: Normal range of motion Neurological:  normal muscle tone. Coordination normal. No atrophy Skin: Skin warm and dry Psychiatric: normal affect, pleasant   Recent Labs: 10/12/2018: TSH 1.155 04/13/2019: ALT 12; BUN 20; Creatinine, Ser 1.44; Hemoglobin 10.2; Platelets 244; Potassium 4.1; Sodium 132    Lipid Panel Lab Results  Component Value Date   CHOL 204 (H) 03/29/2017   HDL 55 03/29/2017   LDLCALC 122 (H) 03/29/2017   TRIG 155 (H) 03/29/2017      Wt Readings from Last 3 Encounters:  06/05/19 149 lb 8 oz (67.8 kg)  04/13/19 148 lb 13 oz (67.5 kg)  11/17/18 138 lb 14.2 oz (63 kg)    ASSESSMENT AND PLAN:  Essential (primary) hypertension BP elevated today, Weight up Recommend she monitor blood pressure at home She does report better numbers at home in general using her own blood pressure cuff  Non-ST elevation (NSTEMI) myocardial infarction [facility])  cardiac catheterization, no obstructive lesions, significant coronary calcification in the LAD particularly on Zocor, compliant No chest pain, discussed anginal symptoms to watch for  Hypercholesteremia  heavy calcification LAD Labs order today ,  lipids, LFTS  Anxiety recommended she start regular walking program during the winter Doing well  Hyponatremia Several hospitalizations February and March 2019  stable sodium 132 Avoiding free water, on sodium tabs, followed by nephrology  Smoker Reports that she is not a smoker currently  Shortness of breath Shortness of breath improved Moderately elevated right heart pressures on echocardiogram on in the hospital 2018 Continues on Lasix with potassium   Total encounter time more than 25 minutes  Greater than 50% was spent in counseling and coordination of care with the patient   Disposition:   F/U  12 months   Orders Placed This Encounter  Procedures  . EKG 12-Lead     Signed, Esmond Plants, M.D., Ph.D. 06/05/2019  [place]  [place], [place]

## 2019-06-05 ENCOUNTER — Encounter: Payer: Self-pay | Admitting: Cardiovascular Disease

## 2019-06-05 ENCOUNTER — Other Ambulatory Visit: Payer: Self-pay

## 2019-06-05 ENCOUNTER — Ambulatory Visit (INDEPENDENT_AMBULATORY_CARE_PROVIDER_SITE_OTHER): Payer: PPO | Admitting: Cardiovascular Disease

## 2019-06-05 VITALS — BP 150/72 | HR 54 | Ht 61.0 in | Wt 149.5 lb

## 2019-06-05 DIAGNOSIS — N189 Chronic kidney disease, unspecified: Secondary | ICD-10-CM

## 2019-06-05 DIAGNOSIS — I25110 Atherosclerotic heart disease of native coronary artery with unstable angina pectoris: Secondary | ICD-10-CM | POA: Diagnosis not present

## 2019-06-05 DIAGNOSIS — I10 Essential (primary) hypertension: Secondary | ICD-10-CM

## 2019-06-05 DIAGNOSIS — D631 Anemia in chronic kidney disease: Secondary | ICD-10-CM | POA: Diagnosis not present

## 2019-06-05 DIAGNOSIS — J449 Chronic obstructive pulmonary disease, unspecified: Secondary | ICD-10-CM | POA: Diagnosis not present

## 2019-06-05 DIAGNOSIS — R0602 Shortness of breath: Secondary | ICD-10-CM | POA: Diagnosis not present

## 2019-06-05 NOTE — Patient Instructions (Addendum)
Medication Instructions:  No changes  If you need a refill on your cardiac medications before your next appointment, please call your pharmacy.    Lab work: Labs: liver and LFTs today  If you have labs (blood work) drawn today and your tests are completely normal, you will receive your results only by: Marland Kitchen MyChart Message (if you have MyChart) OR . A paper copy in the mail If you have any lab test that is abnormal or we need to change your treatment, we will call you to review the results.   Testing/Procedures: No new testing needed   Follow-Up: At [facility], you and your health needs are our priority.  As part of our continuing mission to provide you with exceptional heart care, we have created designated Provider Care Teams.  These Care Teams include your primary Cardiologist (physician) and Advanced Practice Providers (APPs -  Physician Assistants and Nurse Practitioners) who all work together to provide you with the care you need, when you need it.  . You will need a follow up appointment in 12 months .  Marland Kitchen Providers on your designated Care Team:   . Murray Hodgkins, NP . Christell Faith, PA-C . Marrianne Mood, PA-C  Any Other Special Instructions Will Be Listed Below (If Applicable).  COVID-19 Vaccine Information can be found at: [email or web address].co.uk For questions related to vaccine distribution or appointments, please email vaccine@[email or web address] .[email or web address] or call [phone].

## 2019-06-06 LAB — HEPATIC FUNCTION PANEL
ALT: 10 IU/L (ref 0–32)
AST: 17 IU/L (ref 0–40)
Albumin: 4 g/dL (ref 3.7–4.7)
Alkaline Phosphatase: 55 IU/L (ref 39–117)
Bilirubin Total: 0.3 mg/dL (ref 0.0–1.2)
Bilirubin, Direct: 0.08 mg/dL (ref 0.00–0.40)
Total Protein: 7.5 g/dL (ref 6.0–8.5)

## 2019-06-06 LAB — LIPID PANEL
Chol/HDL Ratio: 2.8 ratio (ref 0.0–4.4)
Cholesterol, Total: 147 mg/dL (ref 100–199)
HDL: 53 mg/dL (ref >39)
LDL Chol Calc (NIH): 75 mg/dL (ref 0–99)
Triglycerides: 101 mg/dL (ref 0–149)
VLDL Cholesterol Cal: 19 mg/dL (ref 5–40)

## 2019-06-19 ENCOUNTER — Telehealth: Payer: Self-pay | Admitting: *Deleted

## 2019-06-19 MED ORDER — EZETIMIBE 10 MG PO TABS: 10.0000 mg | ORAL_TABLET | Freq: Every day | ORAL | 3 refills | Status: DC

## 2019-06-19 NOTE — Addendum Note (Signed)
Addended by: Vanessa Ralphs on: 06/19/2019 02:05 PM   Modules accepted: Orders

## 2019-06-19 NOTE — Telephone Encounter (Signed)
-----   Message from Minna Merritts, MD sent at 06/18/2019  7:46 PM EST ----- LDL slightly above goal Would add zetia 10 mg daily

## 2019-06-19 NOTE — Telephone Encounter (Signed)
Patient calling back in for results  Please call back when able

## 2019-06-19 NOTE — Telephone Encounter (Signed)
Left voicemail message to call back for results.  

## 2019-06-19 NOTE — Telephone Encounter (Signed)
Results called to patient and husband. They verbalized understanding of results and is agreeable to start zetia. Rx sent to pharmacy.

## 2019-06-21 NOTE — Progress Notes (Signed)
Results were called to patient.

## 2019-07-07 DIAGNOSIS — Z961 Presence of intraocular lens: Secondary | ICD-10-CM | POA: Diagnosis not present

## 2019-07-17 DIAGNOSIS — N1832 Chronic kidney disease, stage 3b: Secondary | ICD-10-CM | POA: Diagnosis not present

## 2019-07-17 DIAGNOSIS — R809 Proteinuria, unspecified: Secondary | ICD-10-CM | POA: Diagnosis not present

## 2019-07-17 DIAGNOSIS — E871 Hypo-osmolality and hyponatremia: Secondary | ICD-10-CM | POA: Diagnosis not present

## 2019-07-17 DIAGNOSIS — I10 Essential (primary) hypertension: Secondary | ICD-10-CM | POA: Diagnosis not present

## 2019-07-17 DIAGNOSIS — D631 Anemia in chronic kidney disease: Secondary | ICD-10-CM | POA: Diagnosis not present

## 2019-07-31 ENCOUNTER — Other Ambulatory Visit: Payer: Self-pay | Admitting: Hematology and Oncology

## 2019-07-31 ENCOUNTER — Other Ambulatory Visit: Payer: Self-pay | Admitting: Cardiovascular Disease

## 2019-07-31 DIAGNOSIS — Z170 Estrogen receptor positive status [ER+]: Secondary | ICD-10-CM

## 2019-07-31 DIAGNOSIS — C50412 Malignant neoplasm of upper-outer quadrant of left female breast: Secondary | ICD-10-CM

## 2019-07-31 DIAGNOSIS — D649 Anemia, unspecified: Secondary | ICD-10-CM

## 2019-10-17 ENCOUNTER — Ambulatory Visit
Admission: RE | Admit: 2019-10-17 | Discharge: 2019-10-17 | Disposition: A | Discharge status: Home / Self Care | Payer: PPO | Source: Ambulatory Visit | Attending: Nurse Practitioner | Admitting: Nurse Practitioner

## 2019-10-17 ENCOUNTER — Other Ambulatory Visit: Payer: Self-pay

## 2019-10-17 DIAGNOSIS — Z1231 Encounter for screening mammogram for malignant neoplasm of breast: Secondary | ICD-10-CM | POA: Diagnosis not present

## 2019-10-17 DIAGNOSIS — C50412 Malignant neoplasm of upper-outer quadrant of left female breast: Secondary | ICD-10-CM

## 2019-10-17 DIAGNOSIS — Z853 Personal history of malignant neoplasm of breast: Secondary | ICD-10-CM | POA: Insufficient documentation

## 2019-10-18 ENCOUNTER — Telehealth: Payer: Self-pay | Admitting: *Deleted

## 2019-10-18 NOTE — Telephone Encounter (Signed)
Patient is > 15 years since quitting smoking and is no longer eligible for lung cancer screening. Message sent to lung nodule department to evaluate for need of ongoing observation of any known nodules.

## 2019-11-07 ENCOUNTER — Inpatient Hospital Stay: Admission: RE | Admit: 2019-11-07 | Payer: PPO | Source: Ambulatory Visit

## 2019-11-09 ENCOUNTER — Ambulatory Visit: Payer: PPO | Admitting: Nurse Practitioner

## 2019-11-09 ENCOUNTER — Other Ambulatory Visit: Payer: PPO

## 2019-11-16 DIAGNOSIS — I10 Essential (primary) hypertension: Secondary | ICD-10-CM | POA: Diagnosis not present

## 2019-11-16 DIAGNOSIS — N1832 Chronic kidney disease, stage 3b: Secondary | ICD-10-CM | POA: Diagnosis not present

## 2019-11-16 DIAGNOSIS — D631 Anemia in chronic kidney disease: Secondary | ICD-10-CM | POA: Diagnosis not present

## 2019-11-16 DIAGNOSIS — R809 Proteinuria, unspecified: Secondary | ICD-10-CM | POA: Diagnosis not present

## 2019-11-16 DIAGNOSIS — E871 Hypo-osmolality and hyponatremia: Secondary | ICD-10-CM | POA: Diagnosis not present

## 2019-11-20 ENCOUNTER — Other Ambulatory Visit: Payer: Self-pay

## 2019-11-20 DIAGNOSIS — E538 Deficiency of other specified B group vitamins: Secondary | ICD-10-CM

## 2019-11-20 NOTE — Progress Notes (Signed)
[facility]  [address], [address] [place], [place] [address] Phone: [phone]  Fax: [phone]   Clinic Day:  11/21/2019  Referring physician: Sofie Hartigan, MD  Chief Complaint: [place] is a 74 y.o. female with stage IIA left breast cancer, anemia of chronic renal disease, and electrolytes abnormalities (hyponatremia and hypercalcemia) who is seen for 7 month assessment.  HPI: The patient was last seen in the medical oncology clinic on 04/13/2019 by Beckey Rutter, NP. At that time, she was stable and doing well. Hematocrit was 30.5, hemoglobin 10.2, MCV 101.3, platelets 244,000, WBC 5,500. Sodium was 132. Creatinine was 1.44 (CrCl 36 ml/min). Calcium was 8.8. CA27.29 was 16.6. She continued tamoxifen.  Right screening mammogram on 10/17/2019 revealed no evidence of malignancy.  The patient was a no show for her bone density on 11/07/2019.  The patient saw Dr. Holley Raring on 11/16/2019.  Labs from 07/17/2019 revealed a hematocrit 30.7, hemoglobin 10.2, MCV 101.3, platelets 288,000, WBC 6,200. Creatinine 1.36. CrCl 38 ml/min. Sodium 132.   During the interim, she has been "ok."  She has been feeling tired. She reports that her blood sugar was 410 last week. The patient is on Lasix and has been drinking a lot of water recently. She has a couple of tamoxifen pills left and needs a refill. The patient did not know about her bone density appointment. She had cataract surgery and no longer has blurred vision. She denies any eye problems. She performs regular breast self exams. She denies any concerns.  The patient received her COVID-19 vaccine and has no complications.   Past Medical History:  Diagnosis Date  . Anemia   . Anxiety disorder   . Breast cancer ([place]) 12/03/2015   lt breast  . Breast cancer of upper-outer quadrant of left female breast ([place]) 11/2015   pT2 pN0(i+).;ER+; PR +, her 2 neu not overexpressed.  Mastectomy, SLN, Mammoprint: Low  risk.   . Cancer ([place]) 12/03/2015   left breast/ INVASIVE LOBULAR CARCINOMA.   . Chronic kidney disease    STAGE 3  . COPD (chronic obstructive pulmonary disease) (HCC)    MILD  . Cough    lingering, mild, finished Prednisone and anitbiotic 11/03/15  . Dyspnea    DOE  . Family history of adverse reaction to anesthesia    sister - PONV  . GERD (gastroesophageal reflux disease)   . Hypertension   . Myocardial infarction ([place])   . Osteopenia   . Osteoporosis   . Pericarditis    diagnonsed June, 2010, unclear etiology as of yer  . Personal history of tobacco use, presenting hazards to health 10/31/2015  . Scleroderma (HCC)    ONLY ON SKIN-MILD  . UTI (lower urinary tract infection)   . Wears dentures    full upper    Past Surgical History:  Procedure Laterality Date  . BREAST BIOPSY Right 2012   core - neg  . BREAST BIOPSY Left 12/03/2015   INVASIVE LOBULAR CARCINOMA.   Marland Kitchen CARDIAC CATHETERIZATION    . CATARACT EXTRACTION W/ INTRAOCULAR LENS IMPLANT Right   . CATARACT EXTRACTION W/PHACO Left 11/17/2018   Procedure: CATARACT EXTRACTION PHACO AND INTRAOCULAR LENS PLACEMENT;  Surgeon: Marchia Meiers, MD;  Location: [facility];  Service: Ophthalmology;  Laterality: Left;  [place] 00:55 CDE 11.04 Fluid Pack Lot # [id number]  . COLONOSCOPY WITH PROPOFOL N/A 11/08/2015   Procedure: COLONOSCOPY WITH PROPOFOL;  Surgeon: Lucilla Lame, MD;  Location: [place] [facility];  Service: Endoscopy;  Laterality: N/A;  . CORONARY ANGIOPLASTY    . ESOPHAGOGASTRODUODENOSCOPY (EGD) WITH PROPOFOL N/A 11/18/2017   Procedure: ESOPHAGOGASTRODUODENOSCOPY (EGD) WITH PROPOFOL;  Surgeon: Virgel Manifold, MD;  Location: [facility];  Service: Endoscopy;  Laterality: N/A;  . ESOPHAGOGASTRODUODENOSCOPY (EGD) WITH PROPOFOL N/A 02/15/2018   Procedure: ESOPHAGOGASTRODUODENOSCOPY (EGD) WITH PROPOFOL;  Surgeon: Jonathon Bellows, MD;  Location: [facility] ENDOSCOPY;  Service: Gastroenterology;  Laterality: N/A;  .  ESOPHAGOGASTRODUODENOSCOPY (EGD) WITH PROPOFOL N/A 05/10/2018   Procedure: ESOPHAGOGASTRODUODENOSCOPY (EGD) WITH BIOPSIES;  Surgeon: Virgel Manifold, MD;  Location: [place];  Service: Endoscopy;  Laterality: N/A;  . EVACUATION BREAST HEMATOMA Left 01/14/2016   Procedure: EVACUATION HEMATOMA BREAST;  Surgeon: Robert Bellow, MD;  Location: [facility];  Service: General;  Laterality: Left;  . EYE SURGERY    . MASTECTOMY Left 2017   complete mastectomy  . MASTECTOMY W/ SENTINEL NODE BIOPSY Left 12/26/2015   Procedure: MASTECTOMY WITH SENTINEL LYMPH NODE BIOPSY;  Surgeon: Robert Bellow, MD;  Location: [facility];  Service: General;  Laterality: Left;  . RIGHT/LEFT HEART CATH AND CORONARY ANGIOGRAPHY N/A 07/22/2016   Procedure: Right/Left Heart Cath and Coronary Angiography;  Surgeon: Minna Merritts, MD;  Location: [facility];  Service: Cardiovascular;  Laterality: N/A;  . TUBAL LIGATION    . VESICOVAGINAL FISTULA CLOSURE W/ TAH      Family History  Problem Relation Age of Onset  . Heart failure Mother   . Epilepsy Mother   . COPD Father   . Heart disease Father   . Anxiety disorder Sister   . Arthritis Brother   . Heart disease Brother   . Vaginal cancer Paternal Grandmother   . Heart attack Paternal Grandfather   . COPD Brother   . Kidney failure Brother   . COPD Brother   . Arthritis Sister   . Uterine cancer Other   . Diabetes Other   . Colon cancer Neg Hx   . Stomach cancer Neg Hx   . Breast cancer Neg Hx     Social History:  reports that she quit smoking about 16 years ago. Her smoking use included cigarettes. She has a 30.00 pack-year smoking history. She has never used smokeless tobacco. She reports current alcohol use of about 1.0 - 3.0 standard drink of alcohol per week. She reports that she does not use drugs. She has 5 brothers and 2 sisters.  She has 2 children who are alive and well.  She lives in [place] with her husband, [place]. She is  accompanied by [place] on the iPad today.  Allergies:  Allergies  Allergen Reactions  . Pimenta Nausea And Vomiting  . Tomato Rash    Current Medications: Current Outpatient Medications  Medication Sig Dispense Refill  . albuterol (PROVENTIL) (2.5 MG/3ML) 0.083% nebulizer solution Take 3 mLs (2.5 mg total) by nebulization every 6 (six) hours as needed for wheezing or shortness of breath. 75 mL 12  . amLODipine (NORVASC) 2.5 MG tablet Take 1 tablet (2.5 mg total) by mouth daily. 30 tablet 0  . Cyanocobalamin (B-12) 5000 MCG CAPS Take 1,000 mcg by mouth daily.     Marland Kitchen ezetimibe (ZETIA) 10 MG tablet Take 1 tablet (10 mg total) by mouth daily. 90 tablet 3  . Ferrous Sulfate (IRON PO) Take 65 mg of iron by mouth. Taking 1 tablet daily    . fluticasone (FLONASE) 50 MCG/ACT nasal spray Place 2 sprays into both nostrils daily. 16 g 0  . furosemide (LASIX) 20  MG tablet Take 20 mg by mouth daily.     Marland Kitchen GARLIC PO Take 1 tablet by mouth daily.    . hydrALAZINE (APRESOLINE) 25 MG tablet Take 1 tablet (25 mg total) by mouth 2 (two) times daily. 60 tablet 11  . KRILL OIL PO Take 1 capsule by mouth daily.    Marland Kitchen lisinopril (PRINIVIL,ZESTRIL) 20 MG tablet Take 1 tablet (20 mg total) by mouth daily. 30 tablet 2  . metoprolol succinate (TOPROL-XL) 25 MG 24 hr tablet Take 1 tablet (25 mg total) by mouth daily. 90 tablet 3  . potassium chloride SA (K-DUR,KLOR-CON) 20 MEQ tablet Take 1 tablet (20 mEq total) by mouth daily. (Patient taking differently: Take 20 mEq by mouth 2 (two) times daily. ) 90 tablet 2  . simvastatin (ZOCOR) 40 MG tablet Take 1 tablet (40 mg total) by mouth daily. 30 tablet 3  . sodium chloride 1 g tablet Take 1 g by mouth daily.    . tamoxifen (NOLVADEX) 20 MG tablet Take 1 tablet (20 mg total) by mouth daily. 90 tablet 0  . nitroGLYCERIN (NITROSTAT) 0.4 MG SL tablet Place 1 tablet (0.4 mg total) under the tongue every 5 (five) minutes as needed for chest pain. (Patient not taking: Reported on  11/21/2019) 30 tablet 0  . ondansetron (ZOFRAN ODT) 4 MG disintegrating tablet Take 1 tablet (4 mg total) by mouth every 8 (eight) hours as needed. (Patient not taking: Reported on 11/21/2019) 20 tablet 1   No current facility-administered medications for this visit.    Review of Systems  Constitutional: Positive for malaise/fatigue. Negative for chills, diaphoresis, fever and weight loss (stable).       Feels "ok".  HENT: Negative.  Negative for congestion, ear discharge, ear pain, hearing loss, nosebleeds, sinus pain, sore throat and tinnitus.   Eyes: Negative for blurred vision, photophobia and pain.       S/p cataract surgery  Respiratory: Negative.  Negative for hemoptysis, sputum production and shortness of breath.   Cardiovascular: Negative.  Negative for chest pain, palpitations, orthopnea, leg swelling and PND.  Gastrointestinal: Negative for abdominal pain, blood in stool, constipation, diarrhea, heartburn, melena, nausea and vomiting.       History of duodenal ulcer. Last colonoscopy 2017, negative.  Genitourinary: Negative.  Negative for dysuria, frequency, hematuria and urgency.  Musculoskeletal: Negative.  Negative for back pain, falls, joint pain, myalgias and neck pain.       Osteoporosis.  Skin: Negative.  Negative for itching and rash.       Scleroderma.  Neurological: Negative.  Negative for dizziness, tremors, sensory change, speech change, focal weakness, weakness and headaches.  Endo/Heme/Allergies: Negative.  Does not bruise/bleed easily.  Psychiatric/Behavioral: Positive for memory loss. Negative for depression. The patient is not nervous/anxious and does not have insomnia.   All other systems reviewed and are negative.  Performance status (ECOG):  1  Vitals: Blood pressure (!) 181/76, pulse 76, temperature (!) 97.2 F (36.2 C), temperature source Tympanic, resp. rate 18, height '5\' 1"'  (1.549 m), weight 148 lb 12.3 oz (67.5 kg), SpO2 100 %.  Physical Exam Vitals  and nursing note reviewed.  Constitutional:      General: She is not in acute distress.    Appearance: She is well-developed. She is not diaphoretic.  HENT:     Head: Normocephalic and atraumatic.     Comments: Short brown hair.    Mouth/Throat:     Mouth: Mucous membranes are moist.  Pharynx: Oropharynx is clear.  Eyes:     General: No scleral icterus.    Extraocular Movements: Extraocular movements intact.     Conjunctiva/sclera: Conjunctivae normal.     Pupils: Pupils are equal, round, and reactive to light.     Comments: Blue eyes s/p cataract surgery.  Cardiovascular:     Rate and Rhythm: Normal rate and regular rhythm.     Heart sounds: Normal heart sounds. No murmur heard.   Pulmonary:     Effort: Pulmonary effort is normal. No respiratory distress.     Breath sounds: Normal breath sounds. No wheezing or rales.  Chest:     Chest wall: No tenderness.     Breasts: Breast asymmetry: left mastectomy.        Right: Skin change (fibrocystic changes, superior quadrant) present. No inverted nipple, mass, nipple discharge or tenderness.        Left: Absent.  Abdominal:     General: Bowel sounds are normal. There is no distension.     Palpations: Abdomen is soft. There is no hepatomegaly, splenomegaly or mass.     Tenderness: There is no abdominal tenderness. There is no guarding or rebound.  Musculoskeletal:        General: No swelling or tenderness. Normal range of motion.     Cervical back: Normal range of motion and neck supple.  Lymphadenopathy:     Head:     Right side of head: No preauricular, posterior auricular or occipital adenopathy.     Left side of head: No preauricular, posterior auricular or occipital adenopathy.     Cervical: No cervical adenopathy.     Upper Body:     Right upper body: No supraclavicular or axillary adenopathy.     Left upper body: No supraclavicular or axillary adenopathy.     Lower Body: No right inguinal adenopathy. No left inguinal  adenopathy.  Skin:    General: Skin is warm and dry.  Neurological:     Mental Status: She is alert and oriented to person, place, and time.  Psychiatric:        Mood and Affect: Mood normal.        Behavior: Behavior normal.        Thought Content: Thought content normal.        Judgment: Judgment normal.    Imaging studies: 11/21/2015:  Left mammogram and ultrasound revealed a 4.2 x 2.4 x 2.6 cm irregular hypoechoic mass in the 1-2 o'clock position in the left breast 3 cm from the nipple.  Left axillary lymph nodes appeared normal.   12/25/2015:  Breast MRI revealed a 6.2 cm area of abnormal enhancement in the upper outer quadrant of the left breast.  The right breast revealed no mass or abnormal enhancement. 12/08/2016:  Right mammogram revealed no evidence of malignancy. 06/04/2017:  Chest CT revealed no pneumonia, edema, collapse or effusion. There was a right middle lobe calcified granuloma.There was a stable 8 mm scar posterior aspect of the right upper lobe. 06/11/2017:  Abdomen and pelvic CT revealed no acute process in the abdomen or pelvis. There was possible bladder wall thickening (? under distention). There was nonspecific presacral and posterior pelvic edema. There was central uterine hypoattenuation (subtle). 06/16/2017:  Head MRI without contrast revealed no acute intracranial abnormality. There was wide spread changes c/w advanced small vessel disease.  06/22/2017:  Bone survey revealed no focal lytic or sclerotic lesion. 08/23/2017: RIGHT diagnostic mammogram and breast ultrasound on 08/23/2017 revealed no mammographic or  sonographic evidence of malignancy in the RIGHT breast. Patient has heterogeneously dense breast tissue (ACR  breast density category C).  10/13/2018:  Right screening mammogram revealed no mammographic evidence of malignancy. 10/17/2019:  Right screening mammogram revealed no evidence of malignancy.   Appointment on 11/21/2019  Component Date  Value Ref Range Status  . Sodium 11/21/2019 126* 135 - 145 mmol/L Final  . Potassium 11/21/2019 4.6  3.5 - 5.1 mmol/L Final  . Chloride 11/21/2019 96* 98 - 111 mmol/L Final  . CO2 11/21/2019 22  22 - 32 mmol/L Final  . Glucose, Bld 11/21/2019 91  70 - 99 mg/dL Final   Glucose reference range applies only to samples taken after fasting for at least 8 hours.  . BUN 11/21/2019 19  8 - 23 mg/dL Final  . Creatinine, Ser 11/21/2019 1.43* 0.44 - 1.00 mg/dL Final  . Calcium 11/21/2019 8.8* 8.9 - 10.3 mg/dL Final  . Total Protein 11/21/2019 8.1  6.5 - 8.1 g/dL Final  . Albumin 11/21/2019 3.9  3.5 - 5.0 g/dL Final  . AST 11/21/2019 23  15 - 41 U/L Final  . ALT 11/21/2019 14  0 - 44 U/L Final  . Alkaline Phosphatase 11/21/2019 46  38 - 126 U/L Final  . Total Bilirubin 11/21/2019 0.7  0.3 - 1.2 mg/dL Final  . GFR calc non Af Amer 11/21/2019 36* >60 mL/min Final  . GFR calc Af Amer 11/21/2019 42* >60 mL/min Final  . Anion gap 11/21/2019 8  5 - 15 Final   Performed at [facility], [address] [place], [place] [address]  . WBC 11/21/2019 5.1  4.0 - 10.5 K/uL Final  . RBC 11/21/2019 3.10* 3.87 - 5.11 MIL/uL Final  . Hemoglobin 11/21/2019 10.6* 12.0 - 15.0 g/dL Final  . HCT 11/21/2019 30.7* 36 - 46 % Final  . MCV 11/21/2019 99.0  80.0 - 100.0 fL Final  . MCH 11/21/2019 34.2* 26.0 - 34.0 pg Final  . MCHC 11/21/2019 34.5  30.0 - 36.0 g/dL Final  . RDW 11/21/2019 12.8  11.5 - 15.5 % Final  . Platelets 11/21/2019 223  150 - 400 K/uL Final  . nRBC 11/21/2019 0.0  0.0 - 0.2 % Final  . Neutrophils Relative % 11/21/2019 60  % Final  . Neutro Abs 11/21/2019 3.1  1.7 - 7.7 K/uL Final  . Lymphocytes Relative 11/21/2019 23  % Final  . Lymphs Abs 11/21/2019 1.2  0.7 - 4.0 K/uL Final  . Monocytes Relative 11/21/2019 13  % Final  . Monocytes Absolute 11/21/2019 0.7  0 - 1 K/uL Final  . Eosinophils Relative 11/21/2019 3  % Final  . Eosinophils Absolute 11/21/2019 0.2  0 - 0 K/uL Final  .  Basophils Relative 11/21/2019 1  % Final  . Basophils Absolute 11/21/2019 0.0  0 - 0 K/uL Final  . Immature Granulocytes 11/21/2019 0  % Final  . Abs Immature Granulocytes 11/21/2019 0.01  0.00 - 0.07 K/uL Final   Performed at [facility], [address], [place], [place] [address]    Assessment:  [place] is a 73 y.o. female with stage IIA (T2N0) left breast cancer s/p mastectomy with sentinel lymph node biopsy on 12/26/2015.   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  Margins were negative.  Two sentinel lymph nodes were negative for macrometastasis, but with isolated tumor cells on IHC stains.  Three additional lymph nodes were positive for isolated  tumor cells on IHC.  Tumor was ER positive (> 90%), PR positive (> 90%), and Her2/neu 2+ (eqivocal).  Her2/neu by FISH was negative.  Pathologic stage was pT2 pN0(i+).  MammaPrint testing revealed low risk luminal type A. There was a 97.8% probability of being disease free at 10 years with hormonal therapy.   Exam in 11/2016 revealed a 4 mm nodule in the left axillae above the mastectomy incision.  Excision biopsy on 12/15/2016 revealed fat necrosis and no malignancy.  Right screening mammogram on 10/17/2019 revealed no evidence of malignancy.  Imaging studies (chest, abdomen, and pelvic CT, head MRI, and bone survey) in 05/2017 revealed no evidence of metastatic disease.  She began tamoxifen on 02/04/2016.  She is tolerating it well.  CA27.29 has been followed: 27.3 on 12/10/2015, 19.4 on 05/30/2016, 16.4 on 09/07/2016, 17.2 on 12/08/2016, 21.3 on 04/09/2017, 17.6 on 08/12/2017, 18.8 on 12/14/2017, 17.2 on 06/16/2018, 17.1 on 10/12/2018, and 18.3 on 11/21/2019.  Bone density on 11/05/2015 revealed osteoporosis with a T score of -2.9 in the AP spine L1-L2 and -1.9 in the left femoral neck.  Bone density on 11/08/2017 revealed osteopenia with a T-score of -2.0   in the AP spine and -1.8 in the LEFT femoral neck.  She started Fosamax in 10/2015.  She is on calcium and vitamin D.  She had a colonoscopy in 09/2015.  She denies any melena, hematochezia, hematuria or vaginal bleeding.  She began oral B12 in early 03/2017.  B12 was 295 on 06/09/2016, 299 on 12/08/2016, 1585 on 05/24/2017, 212 on 03/08/2018, and 354 on 06/16/2018.  Folate was 11.5 on 04/09/2017 and 9.0 on 03/08/2018.  EGD on 11/18/2017 revealed 3 to 4 non-bleeding ulcers, with the largest measuring 8 mm. There was no centralized bleeding, however there was mild oozing noted at the ulcer edges. Pathology revealed active gastritis with no evidence of high grade dysplasia or malignancy. IHC testing (+) for H. pylori.   EGD on 02/15/2018 that demonstrated a single non-bleeding duodenal ulcer measuring 6 mm in largest dimension.  There were mucosal changes noted in the gastric antrum.  Pathology revealed chronic active gastritis.  There was villous atrophy with marked active inflammation noted in the specimen taken from the duodenal bulb.  Nodular mucosa of the gastric body revealed one tiny fragment with intestinal metaplasia.  EGD on 05/10/2018 by revealed a Z-line irregular. There was erythematous mucosa in the antrum. There was a normal duodenal bulb, second portion of the duodenum and examined duodenum. Additional biopsies were obtained on the greater curvature of the gastric body, on the lesser curvature of the gastric body, at the incisura, on the greater curvature of the gastric antrum and on the lesser curvature of the gastric antrum.  Gastric mapping biopsies did not show any dysplasia.  EGD with biopsies was recommended in 2 years to follow these changes over time.   She has a normocytic anemia. She has anemia of chronic renal disease, stage IV.  Work-up on 12/08/2016 and 04/09/2017 revealed a low normal B12 with normal MMA thus r/o B12 deficiency.  SPEP revealed no monoclonal protein. Free  light chain ratio was 2.43 (0.26-1.65), unclear significance.  Ferritin, iron saturation,andfolate were normal.TSH is normal. Retic was 1.2% (inappropriately low) on 09/07/2016 and 1.1% on 06/23/2017. Coombs was negative on 10/19/2016 and 06/24/2017.  Creatinine was 1.41 (CrCl 36 ml/min) on 06/29/2017 and 1.94 (CrCl 19.5 ml/min) on 02/23/2018. She receives Procrit (last 09/22/2018).  24 hour urine (IFE) on 12/28/2016 revealed no monoclonal protein.  24 hour urine on 06/23/2017 revealed kappa free light chains 157 (1.35-24.19), lambda free light chains 6.99 (0.24-6.66), and free light chain ratio 22.46 (2.04-10.37).  Kappa free light chains were 159.9 (ratio 2.79) on 06/23/2017.  SPEP was normal on 06/23/2017.  Bone marrow aspirate and biopsy on 07/07/2017 revealed a variably cellular bone marrow with trilineage hematopoiesis.  There was slight plasmacytosis (4%).  Plasma cells were polyclonal for kappa and lambda light chains.  There were several lymphoid aggregates.  Congo stain for amyloid was negative.  Flow cytometry revealed no monoclonal B-cell population or abnormal T-cell population.  Cytogenetics were normal (56, XX).  She has a history of chronic hyponatremia(dating back to 07/2012). She is followed by nephrology.  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  She is on fluid restriction. Cortisol and uric acid were normal on 06/24/2017.   She has scleroderma.  She has skin thickening at sites of abrasion.  She has hypercalcemia felt related to thiazide diuretic and excess calcium intake.  She was seen by endocrinology.  PTH was 14 (low) and not c/w primary hyperparathyroidism.  TSH was normal. PTH-rp on 06/23/2017 was < 2.0.  ACE level was 47 (normal) on 06/23/2017.  Calcitriol (vitamin D 1, 25 dihydroxy) was 75.3 (19.9-79.3).  Vitamin D, 25 hydroxy was 68.6 (30-100) on 06/22/2017.  She denies excess vitamin A and vitamin D intake.   The patient received both doses of the  COVID-19 vaccine earlier this year.  Symptomatically, she feels "ok".  Exam reveals no evidence of recurrent disease.  Sodium is 126.  Calcium is 8.8.  Plan: 1.  Labs today: CBC with diff, CMP, B12, folate, vit d, CA27.29, ferritin, and iron studies 2.  Stage IIA left breast cancer Clinically, she is doing well.   Exam reveals no evidence of recurrent disease. Mammogram on 10/17/2019 reveals no evidence of recurrent disease. CA 27.29 is 17.1 (normal).  Encourage monthly breast self-exam Continue tamoxifen. 3.  Anemia of chronic renal disease Hematocrit 28.7.  Hemoglobin   9.9.  MCV 101.1 on 02/14/2019. Hematocrit 30.7.  Hemoglobin 10.6.  MCV   99.0 on 11/21/2019. Creatinine is 1.43. Continue to monitor. 4.  Osteopenia Discuss plan for follow-up bone density. Continue Fosamax. 5.  SIADH Sodium is 126. Etiology likely secondary to diuretic and water intake. Follow-up with Dr. Holley Raring. 6.  HYPERcalcemia  Calcium is 8.8 (corrected 8.885). Continue to monitor. 7.   B12 deficiency             B12 is 662 and folate 8.2 today.             Patient on oral B12.             Monitor annually. 8.   RN to contact Dr Elwyn Lade office re: patient's sodium (126). 9.   Reschedule bone density. 10.   RTC in 6 months for MD assessment, labs (CBC with diff, CMP, CA27.29) and review of bone density study.  I discussed the assessment and treatment plan with the patient.  The patient was provided an opportunity to ask questions and all were answered.  The patient agreed with the plan and demonstrated an understanding of the instructions.  The patient was advised to call back if the symptoms worsen or if the condition fails to improve as anticipated.  I provided 15 minutes of face-to-face time during this this encounter and > 50% was spent counseling as documented under my assessment and plan. An additional 6-7 minutes were spent reviewing her chart (Epic  and Care Everywhere) including notes, labs, and  imaging studies.    Lequita Asal, MD, PhD    11/21/2019, 12:06 PM  I, Mirian Mo Tufford, am acting as Education administrator for [employer]. Mike Gip, MD, PhD.  I, Nicolena Schurman C. Mike Gip, MD, have reviewed the above documentation for accuracy and completeness, and I agree with the above.

## 2019-11-21 ENCOUNTER — Encounter: Payer: Self-pay | Admitting: Hematology and Oncology

## 2019-11-21 ENCOUNTER — Inpatient Hospital Stay (HOSPITAL_BASED_OUTPATIENT_CLINIC_OR_DEPARTMENT_OTHER): Payer: PPO | Admitting: Hematology and Oncology

## 2019-11-21 ENCOUNTER — Telehealth: Payer: Self-pay

## 2019-11-21 ENCOUNTER — Inpatient Hospital Stay: Payer: PPO | Attending: Hematology and Oncology

## 2019-11-21 ENCOUNTER — Other Ambulatory Visit: Payer: Self-pay

## 2019-11-21 VITALS — BP 181/76 | HR 76 | Temp 97.2°F | Resp 18 | Ht 61.0 in | Wt 148.8 lb

## 2019-11-21 DIAGNOSIS — Z8249 Family history of ischemic heart disease and other diseases of the circulatory system: Secondary | ICD-10-CM | POA: Diagnosis not present

## 2019-11-21 DIAGNOSIS — R413 Other amnesia: Secondary | ICD-10-CM | POA: Insufficient documentation

## 2019-11-21 DIAGNOSIS — N184 Chronic kidney disease, stage 4 (severe): Secondary | ICD-10-CM | POA: Insufficient documentation

## 2019-11-21 DIAGNOSIS — Z8049 Family history of malignant neoplasm of other genital organs: Secondary | ICD-10-CM | POA: Insufficient documentation

## 2019-11-21 DIAGNOSIS — M8589 Other specified disorders of bone density and structure, multiple sites: Secondary | ICD-10-CM | POA: Diagnosis not present

## 2019-11-21 DIAGNOSIS — D631 Anemia in chronic kidney disease: Secondary | ICD-10-CM | POA: Insufficient documentation

## 2019-11-21 DIAGNOSIS — Z820 Family history of epilepsy and other diseases of the nervous system: Secondary | ICD-10-CM | POA: Diagnosis not present

## 2019-11-21 DIAGNOSIS — C50412 Malignant neoplasm of upper-outer quadrant of left female breast: Secondary | ICD-10-CM | POA: Insufficient documentation

## 2019-11-21 DIAGNOSIS — Z87891 Personal history of nicotine dependence: Secondary | ICD-10-CM | POA: Insufficient documentation

## 2019-11-21 DIAGNOSIS — M8588 Other specified disorders of bone density and structure, other site: Secondary | ICD-10-CM

## 2019-11-21 DIAGNOSIS — E538 Deficiency of other specified B group vitamins: Secondary | ICD-10-CM

## 2019-11-21 DIAGNOSIS — Z170 Estrogen receptor positive status [ER+]: Secondary | ICD-10-CM | POA: Insufficient documentation

## 2019-11-21 DIAGNOSIS — Z841 Family history of disorders of kidney and ureter: Secondary | ICD-10-CM | POA: Diagnosis not present

## 2019-11-21 DIAGNOSIS — E871 Hypo-osmolality and hyponatremia: Secondary | ICD-10-CM

## 2019-11-21 DIAGNOSIS — I129 Hypertensive chronic kidney disease with stage 1 through stage 4 chronic kidney disease, or unspecified chronic kidney disease: Secondary | ICD-10-CM | POA: Insufficient documentation

## 2019-11-21 DIAGNOSIS — Z79899 Other long term (current) drug therapy: Secondary | ICD-10-CM | POA: Insufficient documentation

## 2019-11-21 DIAGNOSIS — Z818 Family history of other mental and behavioral disorders: Secondary | ICD-10-CM | POA: Insufficient documentation

## 2019-11-21 DIAGNOSIS — N189 Chronic kidney disease, unspecified: Secondary | ICD-10-CM

## 2019-11-21 DIAGNOSIS — Z836 Family history of other diseases of the respiratory system: Secondary | ICD-10-CM | POA: Insufficient documentation

## 2019-11-21 DIAGNOSIS — R222 Localized swelling, mass and lump, trunk: Secondary | ICD-10-CM | POA: Diagnosis not present

## 2019-11-21 DIAGNOSIS — Z833 Family history of diabetes mellitus: Secondary | ICD-10-CM | POA: Diagnosis not present

## 2019-11-21 DIAGNOSIS — D649 Anemia, unspecified: Secondary | ICD-10-CM

## 2019-11-21 DIAGNOSIS — Z8261 Family history of arthritis: Secondary | ICD-10-CM | POA: Insufficient documentation

## 2019-11-21 DIAGNOSIS — K2950 Unspecified chronic gastritis without bleeding: Secondary | ICD-10-CM | POA: Diagnosis not present

## 2019-11-21 DIAGNOSIS — Z87440 Personal history of urinary (tract) infections: Secondary | ICD-10-CM | POA: Insufficient documentation

## 2019-11-21 DIAGNOSIS — I252 Old myocardial infarction: Secondary | ICD-10-CM | POA: Insufficient documentation

## 2019-11-21 LAB — CBC WITH DIFFERENTIAL/PLATELET
Abs Immature Granulocytes: 0.01 10*3/uL (ref 0.00–0.07)
Basophils Absolute: 0 10*3/uL (ref 0.0–0.1)
Basophils Relative: 1 %
Eosinophils Absolute: 0.2 10*3/uL (ref 0.0–0.5)
Eosinophils Relative: 3 %
HCT: 30.7 % — ABNORMAL LOW (ref 36.0–46.0)
Hemoglobin: 10.6 g/dL — ABNORMAL LOW (ref 12.0–15.0)
Immature Granulocytes: 0 %
Lymphocytes Relative: 23 %
Lymphs Abs: 1.2 10*3/uL (ref 0.7–4.0)
MCH: 34.2 pg — ABNORMAL HIGH (ref 26.0–34.0)
MCHC: 34.5 g/dL (ref 30.0–36.0)
MCV: 99 fL (ref 80.0–100.0)
Monocytes Absolute: 0.7 10*3/uL (ref 0.1–1.0)
Monocytes Relative: 13 %
Neutro Abs: 3.1 10*3/uL (ref 1.7–7.7)
Neutrophils Relative %: 60 %
Platelets: 223 10*3/uL (ref 150–400)
RBC: 3.1 MIL/uL — ABNORMAL LOW (ref 3.87–5.11)
RDW: 12.8 % (ref 11.5–15.5)
WBC: 5.1 10*3/uL (ref 4.0–10.5)
nRBC: 0 % (ref 0.0–0.2)

## 2019-11-21 LAB — FERRITIN: Ferritin: 63 ng/mL (ref 11–307)

## 2019-11-21 LAB — COMPREHENSIVE METABOLIC PANEL
ALT: 14 U/L (ref 0–44)
AST: 23 U/L (ref 15–41)
Albumin: 3.9 g/dL (ref 3.5–5.0)
Alkaline Phosphatase: 46 U/L (ref 38–126)
Anion gap: 8 (ref 5–15)
BUN: 19 mg/dL (ref 8–23)
CO2: 22 mmol/L (ref 22–32)
Calcium: 8.8 mg/dL — ABNORMAL LOW (ref 8.9–10.3)
Chloride: 96 mmol/L — ABNORMAL LOW (ref 98–111)
Creatinine, Ser: 1.43 mg/dL — ABNORMAL HIGH (ref 0.44–1.00)
GFR calc Af Amer: 42 mL/min — ABNORMAL LOW (ref >60)
GFR calc non Af Amer: 36 mL/min — ABNORMAL LOW (ref >60)
Glucose, Bld: 91 mg/dL (ref 70–99)
Potassium: 4.6 mmol/L (ref 3.5–5.1)
Sodium: 126 mmol/L — ABNORMAL LOW (ref 135–145)
Total Bilirubin: 0.7 mg/dL (ref 0.3–1.2)
Total Protein: 8.1 g/dL (ref 6.5–8.1)

## 2019-11-21 LAB — VITAMIN B12: Vitamin B-12: 662 pg/mL (ref 180–914)

## 2019-11-21 LAB — VITAMIN D 25 HYDROXY (VIT D DEFICIENCY, FRACTURES): Vit D, 25-Hydroxy: 47.49 ng/mL (ref 30–100)

## 2019-11-21 LAB — FOLATE: Folate: 8.2 ng/mL (ref >5.9)

## 2019-11-21 MED ORDER — TAMOXIFEN CITRATE 20 MG PO TABS: 20.0000 mg | ORAL_TABLET | Freq: Every day | ORAL | 3 refills | Status: DC

## 2019-11-21 NOTE — Telephone Encounter (Signed)
I was able to send a message to Dr Holley Raring office to inform them that the patint sodium was 126 today in clinic, Per Dr Mike Gip she would like for Dr Holley Raring to f/u with this patient.

## 2019-11-21 NOTE — Telephone Encounter (Signed)
The patient labs has been  routed to Dr Holley Raring office.

## 2019-11-21 NOTE — Progress Notes (Signed)
No new changes noted today 

## 2019-11-21 NOTE — Telephone Encounter (Signed)
sent 

## 2019-11-21 NOTE — Telephone Encounter (Signed)
-----   Message from Lequita Asal, MD sent at 11/21/2019  1:55 PM EDT ----- Regarding: Please send to patient's PCP and nephrologist  Sodium 126. ----- Message ----- From: Interface, Lab In [place] Sent: 11/21/2019  11:09 AM EDT To: Lequita Asal, MD

## 2019-11-22 LAB — CANCER ANTIGEN 27.29: CA 27.29: 18.3 U/mL (ref 0.0–38.6)

## 2019-11-29 DIAGNOSIS — R7309 Other abnormal glucose: Secondary | ICD-10-CM | POA: Diagnosis not present

## 2019-12-04 ENCOUNTER — Ambulatory Visit
Admission: RE | Admit: 2019-12-04 | Discharge: 2019-12-04 | Disposition: A | Discharge status: Home / Self Care | Payer: PPO | Source: Ambulatory Visit | Attending: Nurse Practitioner | Admitting: Nurse Practitioner

## 2019-12-04 ENCOUNTER — Other Ambulatory Visit: Payer: Self-pay

## 2019-12-04 DIAGNOSIS — C50412 Malignant neoplasm of upper-outer quadrant of left female breast: Secondary | ICD-10-CM

## 2019-12-04 DIAGNOSIS — Z853 Personal history of malignant neoplasm of breast: Secondary | ICD-10-CM | POA: Insufficient documentation

## 2019-12-04 DIAGNOSIS — Z780 Asymptomatic menopausal state: Secondary | ICD-10-CM | POA: Insufficient documentation

## 2019-12-04 DIAGNOSIS — M8580 Other specified disorders of bone density and structure, unspecified site: Secondary | ICD-10-CM | POA: Diagnosis not present

## 2019-12-04 DIAGNOSIS — M8589 Other specified disorders of bone density and structure, multiple sites: Secondary | ICD-10-CM | POA: Diagnosis not present

## 2019-12-04 DIAGNOSIS — Z13820 Encounter for screening for osteoporosis: Secondary | ICD-10-CM | POA: Diagnosis not present

## 2019-12-04 DIAGNOSIS — J449 Chronic obstructive pulmonary disease, unspecified: Secondary | ICD-10-CM | POA: Diagnosis not present

## 2019-12-04 DIAGNOSIS — Z8262 Family history of osteoporosis: Secondary | ICD-10-CM | POA: Diagnosis not present

## 2020-02-05 ENCOUNTER — Other Ambulatory Visit: Payer: Self-pay | Admitting: Cardiovascular Disease

## 2020-02-05 MED ORDER — SIMVASTATIN 40 MG PO TABS: ORAL_TABLET | ORAL | 0 refills | Status: DC

## 2020-02-05 NOTE — Telephone Encounter (Signed)
*  STAT* If patient is at the pharmacy, call can be transferred to refill team.   1. Which medications need to be refilled? (please list name of each medication and dose if known) simvastatin 40 MG 1 tablet daily   2. Which pharmacy/location (including street and city if local pharmacy) is medication to be sent to? [facility]  3. Do they need a 30 day or 90 day supply? 90 day

## 2020-02-05 NOTE — Telephone Encounter (Signed)
Requested Prescriptions   Signed Prescriptions Disp Refills   simvastatin (ZOCOR) 40 MG tablet 90 tablet 0    Sig: Take 1 tablet (40 mg total) by mouth daily.    Authorizing Provider: Minna Merritts    Ordering User: Raelene Bott, Rankin Coolman L

## 2020-03-28 DIAGNOSIS — N1832 Chronic kidney disease, stage 3b: Secondary | ICD-10-CM | POA: Diagnosis not present

## 2020-03-28 DIAGNOSIS — D631 Anemia in chronic kidney disease: Secondary | ICD-10-CM | POA: Diagnosis not present

## 2020-03-28 DIAGNOSIS — E871 Hypo-osmolality and hyponatremia: Secondary | ICD-10-CM | POA: Diagnosis not present

## 2020-03-28 DIAGNOSIS — R809 Proteinuria, unspecified: Secondary | ICD-10-CM | POA: Diagnosis not present

## 2020-05-21 NOTE — Progress Notes (Signed)
[facility]  [address], [address] [place], [place] [address] Phone: [phone]  Fax: [phone]   Clinic Day:  05/22/2020  Referring physician: Sofie Hartigan, MD  Chief Complaint: Bianca Shaw is a 75 y.o. female with stage IIA left breast cancer, anemia of chronic renal disease, and electrolytes abnormalities (hyponatremia and hypercalcemia) who is seen for 6 month assessment.  HPI: The patient was last seen in the medical oncology clinic on 11/21/2019. At that time, she feels "ok".  Exam reveals no evidence of recurrent disease. Hematocrit was 30.7, hemoglobin 10.6, MCV 99.0, platelets 223,000, WBC 5,100. Sodium was 126.  Calcium was 8.8. Creatinine was 1.43 (CrCl 36 ml/min). Ferritin was 63. Vitamin D 25-hydroxy was 47.49. Vitamin B12 was 662 and  folate was 8.2. CA27.29 was 18.3. She continued Fosamax, tamoxifen, and vitamin B12.  Bone density on 12/04/2019 revealed osteopenia with a T score of -1.9 at the right femur neck.  The patient saw Dr. Holley Raring on 03/28/2020. Her chronic kidney disease appeared to be stable. Follow-up was planned for 4 months.   During the interim, she has been fine. One of her eyes is giving her trouble. Her scleroderma is stable. She denies shortness of breath, chest pain, cough, nausea, vomiting, diarrhea, and bone and joint symptoms.  She performs monthly breast self-exams. She takes Fosamax, calcium, vitamin B12, and vitamin D. She takes tamoxifen.   Past Medical History:  Diagnosis Date  . Anemia   . Anxiety disorder   . Breast cancer ([place]) 12/03/2015   lt breast  . Breast cancer of upper-outer quadrant of left female breast ([place]) 11/2015   pT2 pN0(i+).;ER+; PR +, her 2 neu not overexpressed.  Mastectomy, SLN, Mammoprint: Low risk.   . Cancer ([place]) 12/03/2015   left breast/ INVASIVE LOBULAR CARCINOMA.   . Chronic kidney disease    STAGE 3  . COPD (chronic obstructive pulmonary disease) (HCC)    MILD  .  Cough    lingering, mild, finished Prednisone and anitbiotic 11/03/15  . Dyspnea    DOE  . Family history of adverse reaction to anesthesia    sister - PONV  . GERD (gastroesophageal reflux disease)   . Hypertension   . Myocardial infarction ([place])   . Osteopenia   . Osteoporosis   . Pericarditis    diagnonsed June, 2010, unclear etiology as of yer  . Personal history of tobacco use, presenting hazards to health 10/31/2015  . Scleroderma (HCC)    ONLY ON SKIN-MILD  . UTI (lower urinary tract infection)   . Wears dentures    full upper    Past Surgical History:  Procedure Laterality Date  . BREAST BIOPSY Right 2012   core - neg  . BREAST BIOPSY Left 12/03/2015   INVASIVE LOBULAR CARCINOMA.   Marland Kitchen CARDIAC CATHETERIZATION    . CATARACT EXTRACTION W/ INTRAOCULAR LENS IMPLANT Right   . CATARACT EXTRACTION W/PHACO Left 11/17/2018   Procedure: CATARACT EXTRACTION PHACO AND INTRAOCULAR LENS PLACEMENT;  Surgeon: Marchia Meiers, MD;  Location: [facility];  Service: Ophthalmology;  Laterality: Left;  [place] 00:55 CDE 11.04 Fluid Pack Lot # [id number]  . COLONOSCOPY WITH PROPOFOL N/A 11/08/2015   Procedure: COLONOSCOPY WITH PROPOFOL;  Surgeon: Lucilla Lame, MD;  Location: [place];  Service: Endoscopy;  Laterality: N/A;  . CORONARY ANGIOPLASTY    . ESOPHAGOGASTRODUODENOSCOPY (EGD) WITH PROPOFOL N/A 11/18/2017   Procedure: ESOPHAGOGASTRODUODENOSCOPY (EGD) WITH PROPOFOL;  Surgeon: Virgel Manifold, MD;  Location: [facility];  Service: Endoscopy;  Laterality: N/A;  . ESOPHAGOGASTRODUODENOSCOPY (EGD) WITH PROPOFOL N/A 02/15/2018   Procedure: ESOPHAGOGASTRODUODENOSCOPY (EGD) WITH PROPOFOL;  Surgeon: Jonathon Bellows, MD;  Location: [facility];  Service: Gastroenterology;  Laterality: N/A;  . ESOPHAGOGASTRODUODENOSCOPY (EGD) WITH PROPOFOL N/A 05/10/2018   Procedure: ESOPHAGOGASTRODUODENOSCOPY (EGD) WITH BIOPSIES;  Surgeon: Virgel Manifold, MD;  Location: [place];  Service:  Endoscopy;  Laterality: N/A;  . EVACUATION BREAST HEMATOMA Left 01/14/2016   Procedure: EVACUATION HEMATOMA BREAST;  Surgeon: Robert Bellow, MD;  Location: [facility];  Service: General;  Laterality: Left;  . EYE SURGERY    . MASTECTOMY Left 2017   complete mastectomy  . MASTECTOMY W/ SENTINEL NODE BIOPSY Left 12/26/2015   Procedure: MASTECTOMY WITH SENTINEL LYMPH NODE BIOPSY;  Surgeon: Robert Bellow, MD;  Location: [facility];  Service: General;  Laterality: Left;  . RIGHT/LEFT HEART CATH AND CORONARY ANGIOGRAPHY N/A 07/22/2016   Procedure: Right/Left Heart Cath and Coronary Angiography;  Surgeon: Minna Merritts, MD;  Location: [place] [facility];  Service: Cardiovascular;  Laterality: N/A;  . TUBAL LIGATION    . VESICOVAGINAL FISTULA CLOSURE W/ TAH      Family History  Problem Relation Age of Onset  . Heart failure Mother   . Epilepsy Mother   . COPD Father   . Heart disease Father   . Anxiety disorder Sister   . Arthritis Brother   . Heart disease Brother   . Vaginal cancer Paternal Grandmother   . Heart attack Paternal Grandfather   . COPD Brother   . Kidney failure Brother   . COPD Brother   . Arthritis Sister   . Uterine cancer Other   . Diabetes Other   . Colon cancer Neg Hx   . Stomach cancer Neg Hx   . Breast cancer Neg Hx     Social History:  reports that she quit smoking about 16 years ago. Her smoking use included cigarettes. She has a 30.00 pack-year smoking history. She has never used smokeless tobacco. She reports current alcohol use of about 1.0 - 3.0 standard drink of alcohol per week. She reports that she does not use drugs. She has 5 brothers and 2 sisters.  She has 2 children who are alive and well.  She lives in [place] with her husband, Pat Patrick. She is alone today.  Allergies:  Allergies  Allergen Reactions  . Pimenta Nausea And Vomiting  . Tomato Rash    Current Medications: Current Outpatient Medications  Medication Sig Dispense Refill  .  albuterol (PROVENTIL) (2.5 MG/3ML) 0.083% nebulizer solution Take 3 mLs (2.5 mg total) by nebulization every 6 (six) hours as needed for wheezing or shortness of breath. 75 mL 12  . amLODipine (NORVASC) 2.5 MG tablet Take 1 tablet (2.5 mg total) by mouth daily. 30 tablet 0  . Cyanocobalamin (B-12) 5000 MCG CAPS Take 1,000 mcg by mouth daily.     . Ferrous Sulfate (IRON PO) Take 65 mg of iron by mouth. Taking 1 tablet daily    . fluticasone (FLONASE) 50 MCG/ACT nasal spray Place 2 sprays into both nostrils daily. 16 g 0  . furosemide (LASIX) 20 MG tablet Take 20 mg by mouth daily.     Marland Kitchen GARLIC PO Take 1 tablet by mouth daily.    . hydrALAZINE (APRESOLINE) 25 MG tablet Take 1 tablet (25 mg total) by mouth 2 (two) times daily. 60 tablet 11  . KRILL OIL PO Take 1 capsule by mouth daily.    Marland Kitchen  lisinopril (PRINIVIL,ZESTRIL) 20 MG tablet Take 1 tablet (20 mg total) by mouth daily. 30 tablet 2  . metoprolol succinate (TOPROL-XL) 25 MG 24 hr tablet Take 1 tablet (25 mg total) by mouth daily. 90 tablet 3  . potassium chloride SA (K-DUR,KLOR-CON) 20 MEQ tablet Take 1 tablet (20 mEq total) by mouth daily. (Patient taking differently: Take 20 mEq by mouth 2 (two) times daily.) 90 tablet 2  . simvastatin (ZOCOR) 40 MG tablet Take 1 tablet (40 mg total) by mouth daily. 90 tablet 0  . sodium chloride 1 g tablet Take 1 g by mouth daily.    . tamoxifen (NOLVADEX) 20 MG tablet Take 1 tablet (20 mg total) by mouth daily. 90 tablet 3  . ezetimibe (ZETIA) 10 MG tablet Take 1 tablet (10 mg total) by mouth daily. 90 tablet 3  . nitroGLYCERIN (NITROSTAT) 0.4 MG SL tablet Place 1 tablet (0.4 mg total) under the tongue every 5 (five) minutes as needed for chest pain. (Patient not taking: No sig reported) 30 tablet 0  . ondansetron (ZOFRAN ODT) 4 MG disintegrating tablet Take 1 tablet (4 mg total) by mouth every 8 (eight) hours as needed. (Patient not taking: No sig reported) 20 tablet 1  . tamoxifen (NOLVADEX) 20 MG tablet  Take 1 tablet (20 mg total) by mouth daily. (Patient not taking: Reported on 05/22/2020) 90 tablet 0   No current facility-administered medications for this visit.    Review of Systems  Constitutional: Negative for chills, diaphoresis, fever, malaise/fatigue and weight loss (up 2 lbs).  HENT: Negative.  Negative for congestion, ear discharge, ear pain, hearing loss, nosebleeds, sinus pain, sore throat and tinnitus.   Eyes: Negative for photophobia.       S/p cataract surgery  Respiratory: Negative.  Negative for cough, hemoptysis, sputum production and shortness of breath.   Cardiovascular: Negative.  Negative for chest pain, palpitations, orthopnea, leg swelling and PND.  Gastrointestinal: Negative for abdominal pain, blood in stool, constipation, diarrhea, heartburn, melena, nausea and vomiting.  Genitourinary: Negative.  Negative for dysuria, frequency, hematuria and urgency.  Musculoskeletal: Negative for back pain, falls, joint pain, myalgias and neck pain.       Osteoporosis.  Skin: Negative.  Negative for itching and rash.       Scleroderma.  Neurological: Negative.  Negative for dizziness, tremors, sensory change, speech change, focal weakness, weakness and headaches.  Endo/Heme/Allergies: Negative.  Does not bruise/bleed easily.  Psychiatric/Behavioral: Positive for memory loss. Negative for depression. The patient is not nervous/anxious and does not have insomnia.   All other systems reviewed and are negative.  Performance status (ECOG):  1  Vitals: Blood pressure (!) 162/67, pulse (!) 52, temperature 98 F (36.7 C), temperature source Oral, resp. rate 18, weight 150 lb 5.7 oz (68.2 kg), SpO2 100 %.  Physical Exam Vitals and nursing note reviewed.  Constitutional:      General: She is not in acute distress.    Appearance: She is well-developed. She is not diaphoretic.  HENT:     Head: Normocephalic and atraumatic.     Comments: Short brown hair.    Mouth/Throat:      Mouth: Mucous membranes are moist.     Pharynx: Oropharynx is clear.  Eyes:     General: No scleral icterus.    Extraocular Movements: Extraocular movements intact.     Conjunctiva/sclera: Conjunctivae normal.     Pupils: Pupils are equal, round, and reactive to light.     Comments: Blue eyes  s/p cataract surgery.  Cardiovascular:     Rate and Rhythm: Normal rate and regular rhythm.     Heart sounds: Normal heart sounds. No murmur heard.   Pulmonary:     Effort: Pulmonary effort is normal. No respiratory distress.     Breath sounds: Normal breath sounds. No wheezing or rales.  Chest:     Chest wall: No tenderness.  Breasts:     Right: No swelling, bleeding, mass, skin change, tenderness, axillary adenopathy or supraclavicular adenopathy.     Left: Absent. No axillary adenopathy or supraclavicular adenopathy.      Comments: Left chest wall without erythema or nodularity.  Fibrocystic changes superiorly in the right breast. Abdominal:     General: Bowel sounds are normal. There is no distension.     Palpations: Abdomen is soft. There is no hepatomegaly, splenomegaly or mass.     Tenderness: There is no abdominal tenderness. There is no guarding or rebound.  Musculoskeletal:        General: No swelling or tenderness. Normal range of motion.     Cervical back: Normal range of motion and neck supple.  Lymphadenopathy:     Head:     Right side of head: No preauricular, posterior auricular or occipital adenopathy.     Left side of head: No preauricular, posterior auricular or occipital adenopathy.     Cervical: No cervical adenopathy.     Upper Body:     Right upper body: No supraclavicular or axillary adenopathy.     Left upper body: No supraclavicular or axillary adenopathy.     Lower Body: No right inguinal adenopathy. No left inguinal adenopathy.  Skin:    General: Skin is warm and dry.  Neurological:     Mental Status: She is alert and oriented to person, place, and time.   Psychiatric:        Mood and Affect: Mood normal.        Behavior: Behavior normal.        Thought Content: Thought content normal.        Judgment: Judgment normal.    Imaging studies: 11/21/2015:  Left mammogram and ultrasound revealed a 4.2 x 2.4 x 2.6 cm irregular hypoechoic mass in the 1-2 o'clock position in the left breast 3 cm from the nipple.  Left axillary lymph nodes appeared normal.   12/25/2015:  Breast MRI revealed a 6.2 cm area of abnormal enhancement in the upper outer quadrant of the left breast.  The right breast revealed no mass or abnormal enhancement. 12/08/2016:  Right mammogram revealed no evidence of malignancy. 06/04/2017:  Chest CT revealed no pneumonia, edema, collapse or effusion. There was a right middle lobe calcified granuloma.There was a stable 8 mm scar posterior aspect of the right upper lobe. 06/11/2017:  Abdomen and pelvic CT revealed no acute process in the abdomen or pelvis. There was possible bladder wall thickening (? under distention). There was nonspecific presacral and posterior pelvic edema. There was central uterine hypoattenuation (subtle). 06/16/2017:  Head MRI without contrast revealed no acute intracranial abnormality. There was wide spread changes c/w advanced small vessel disease.  06/22/2017:  Bone survey revealed no focal lytic or sclerotic lesion. 08/23/2017: RIGHT diagnostic mammogram and breast ultrasound on 08/23/2017 revealed no mammographic or sonographic evidence of malignancy in the RIGHT breast. Patient has heterogeneously dense breast tissue (ACR  breast density category C).  10/13/2018:  Right screening mammogram revealed no mammographic evidence of malignancy. 10/17/2019:  Right screening mammogram revealed no  evidence of malignancy.   Appointment on 05/22/2020  Component Date Value Ref Range Status  . Sodium 05/22/2020 133* 135 - 145 mmol/L Final  . Potassium 05/22/2020 4.3  3.5 - 5.1 mmol/L Final  . Chloride 05/22/2020  101  98 - 111 mmol/L Final  . CO2 05/22/2020 24  22 - 32 mmol/L Final  . Glucose, Bld 05/22/2020 100* 70 - 99 mg/dL Final   Glucose reference range applies only to samples taken after fasting for at least 8 hours.  . BUN 05/22/2020 18  8 - 23 mg/dL Final  . Creatinine, Ser 05/22/2020 1.29* 0.44 - 1.00 mg/dL Final  . Calcium 05/22/2020 8.8* 8.9 - 10.3 mg/dL Final  . Total Protein 05/22/2020 8.1  6.5 - 8.1 g/dL Final  . Albumin 05/22/2020 4.0  3.5 - 5.0 g/dL Final  . AST 05/22/2020 20  15 - 41 U/L Final  . ALT 05/22/2020 10  0 - 44 U/L Final  . Alkaline Phosphatase 05/22/2020 42  38 - 126 U/L Final  . Total Bilirubin 05/22/2020 0.3  0.3 - 1.2 mg/dL Final  . GFR, Estimated 05/22/2020 43* >60 mL/min Final   Comment: (NOTE) Calculated using the CKD-EPI Creatinine Equation (2021)   . Anion gap 05/22/2020 8  5 - 15 Final   Performed at [facility], [address], [place], [place] [address]  . WBC 05/22/2020 5.5  4.0 - 10.5 K/uL Final  . RBC 05/22/2020 3.17* 3.87 - 5.11 MIL/uL Final  . Hemoglobin 05/22/2020 10.6* 12.0 - 15.0 g/dL Final  . HCT 05/22/2020 32.1* 36.0 - 46.0 % Final  . MCV 05/22/2020 101.3* 80.0 - 100.0 fL Final  . MCH 05/22/2020 33.4  26.0 - 34.0 pg Final  . MCHC 05/22/2020 33.0  30.0 - 36.0 g/dL Final  . RDW 05/22/2020 13.3  11.5 - 15.5 % Final  . Platelets 05/22/2020 213  150 - 400 K/uL Final  . nRBC 05/22/2020 0.0  0.0 - 0.2 % Final  . Neutrophils Relative % 05/22/2020 59  % Final  . Neutro Abs 05/22/2020 3.2  1.7 - 7.7 K/uL Final  . Lymphocytes Relative 05/22/2020 22  % Final  . Lymphs Abs 05/22/2020 1.2  0.7 - 4.0 K/uL Final  . Monocytes Relative 05/22/2020 15  % Final  . Monocytes Absolute 05/22/2020 0.8  0.1 - 1.0 K/uL Final  . Eosinophils Relative 05/22/2020 3  % Final  . Eosinophils Absolute 05/22/2020 0.2  0.0 - 0.5 K/uL Final  . Basophils Relative 05/22/2020 1  % Final  . Basophils Absolute 05/22/2020 0.1  0.0 - 0.1 K/uL Final  . Immature  Granulocytes 05/22/2020 0  % Final  . Abs Immature Granulocytes 05/22/2020 0.02  0.00 - 0.07 K/uL Final   Performed at [facility], [address], [place], [place] [address]    Assessment:  CATALEA Shaw is a 75 y.o. female with stage IIA (T2N0) left breast cancer s/p mastectomy with sentinel lymph node biopsy on 12/26/2015.   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  Margins were negative.  Two sentinel lymph nodes were negative for macrometastasis, but with isolated tumor cells on IHC stains.  Three additional lymph nodes were positive for isolated tumor cells on IHC.  Tumor was ER positive (> 90%), PR positive (> 90%), and Her2/neu 2+ (eqivocal).  Her2/neu by FISH was negative.  Pathologic stage was pT2 pN0(i+).  MammaPrint testing revealed low risk luminal type A. There  was a 97.8% probability of being disease free at 10 years with hormonal therapy.   Exam in 11/2016 revealed a 4 mm nodule in the left axillae above the mastectomy incision.  Excision biopsy on 12/15/2016 revealed fat necrosis and no malignancy.  Right screening mammogram on 10/17/2019 revealed no evidence of malignancy.  Imaging studies (chest, abdomen, and pelvic CT, head MRI, and bone survey) in 05/2017 revealed no evidence of metastatic disease.  She began tamoxifen on 02/04/2016.  She is tolerating it well.  CA27.29 has been followed: 27.3 on 12/10/2015, 19.4 on 05/30/2016, 16.4 on 09/07/2016, 17.2 on 12/08/2016, 21.3 on 04/09/2017, 17.6 on 08/12/2017, 18.8 on 12/14/2017, 17.2 on 06/16/2018, 17.1 on 10/12/2018, 18.3 on 11/21/2019, and 17.2 on 05/22/2020.  Bone density on 11/05/2015 revealed osteoporosis with a T score of -2.9 in the AP spine L1-L2 and -1.9 in the left femoral neck.  Bone density on 11/08/2017 revealed osteopenia with a T-score of -2.0  in the AP spine and -1.8 in the LEFT femoral neck.  Bone density on 12/04/2019 revealed  osteopenia with a T score of -1.9 at the right femur neck.  She started Fosamax in 10/2015.  She is on calcium and vitamin D.  She had a colonoscopy in 09/2015.  She denies any melena, hematochezia, hematuria or vaginal bleeding.  She began oral B12 in early 03/2017.  B12 was 295 on 06/09/2016, 299 on 12/08/2016, 1585 on 05/24/2017, 212 on 03/08/2018, and 354 on 06/16/2018.  Folate was 11.5 on 04/09/2017 and 9.0 on 03/08/2018.  EGD on 11/18/2017 revealed 3 to 4 non-bleeding ulcers, with the largest measuring 8 mm. There was no centralized bleeding, however there was mild oozing noted at the ulcer edges. Pathology revealed active gastritis with no evidence of high grade dysplasia or malignancy. IHC testing (+) for H. pylori.   EGD on 02/15/2018 that demonstrated a single non-bleeding duodenal ulcer measuring 6 mm in largest dimension.  There were mucosal changes noted in the gastric antrum.  Pathology revealed chronic active gastritis.  There was villous atrophy with marked active inflammation noted in the specimen taken from the duodenal bulb.  Nodular mucosa of the gastric body revealed one tiny fragment with intestinal metaplasia.  EGD on 05/10/2018 by revealed a Z-line irregular. There was erythematous mucosa in the antrum. There was a normal duodenal bulb, second portion of the duodenum and examined duodenum. Additional biopsies were obtained on the greater curvature of the gastric body, on the lesser curvature of the gastric body, at the incisura, on the greater curvature of the gastric antrum and on the lesser curvature of the gastric antrum.  Gastric mapping biopsies did not show any dysplasia.  EGD with biopsies was recommended in 2 years to follow these changes over time.   She has a normocytic anemia. She has anemia of chronic renal disease, stage IV.  Work-up on 12/08/2016 and 04/09/2017 revealed a low normal B12 with normal MMA thus r/o B12 deficiency.  SPEP revealed no monoclonal  protein. Free light chain ratio was 2.43 (0.26-1.65), unclear significance.  Ferritin, iron saturation,andfolate were normal.TSH is normal. Retic was 1.2% (inappropriately low) on 09/07/2016 and 1.1% on 06/23/2017. Coombs was negative on 10/19/2016 and 06/24/2017.  Creatinine was 1.41 (CrCl 36 ml/min) on 06/29/2017 and 1.94 (CrCl 19.5 ml/min) on 02/23/2018. She receives Procrit (last 09/22/2018).  24 hour urine (IFE) on 12/28/2016 revealed no monoclonal protein. 24 hour urine on 06/23/2017 revealed kappa free light chains 157 (1.35-24.19), lambda free light chains 6.99 (0.24-6.66), and free  light chain ratio 22.46 (2.04-10.37).  Kappa free light chains were 159.9 (ratio 2.79) on 06/23/2017.  SPEP was normal on 06/23/2017.  Bone marrow aspirate and biopsy on 07/07/2017 revealed a variably cellular bone marrow with trilineage hematopoiesis.  There was slight plasmacytosis (4%).  Plasma cells were polyclonal for kappa and lambda light chains.  There were several lymphoid aggregates.  Congo stain for amyloid was negative.  Flow cytometry revealed no monoclonal B-cell population or abnormal T-cell population.  Cytogenetics were normal (26, XX).  She has a history of chronic hyponatremia(dating back to 07/2012). She is followed by nephrology.  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  She is on fluid restriction. Cortisol and uric acid were normal on 06/24/2017.   She has scleroderma.  She has skin thickening at sites of abrasion.  She has hypercalcemia felt related to thiazide diuretic and excess calcium intake.  She was seen by endocrinology.  PTH was 14 (low) and not c/w primary hyperparathyroidism.  TSH was normal. PTH-rp on 06/23/2017 was < 2.0.  ACE level was 47 (normal) on 06/23/2017.  Calcitriol (vitamin D 1, 25 dihydroxy) was 75.3 (19.9-79.3).  Vitamin D, 25 hydroxy was 68.6 (30-100) on 06/22/2017.  She denies excess vitamin A and vitamin D intake.   The patient received both  doses of the COVID-19 vaccine earlier this year.  Symptomatically, he feels "fine".  She denies any breast concerns.  Exam is stable.  Plan: 1.  Labs today: CBC with diff, CMP, CA27.29 2.  Stage IIA left breast cancer Symptomatically, she is doing well Exam reveals no evidence of recurrent disease. Mammogram on 10/17/2019 reveals no evidence of recurrent disease. CA27.29 is 17.2 (normal). Continue tamoxifen (began 02/04/2016). Continue yearly mammogram.   Discuss consideration of extended adjuvant endocrine therapy.  Information on BCI testing provided. Continue surveillance. 3.  Anemia of chronic renal disease Hematocrit 32.1.  Hemoglobin 10.6.  MCV 101.3 on 05/22/2020. Creatinine is 1.29 (improved). Continue to monitor. 4.  Osteopenia Bone density on 12/04/2019 revealed osteopenia with a T score of -1.9 at the right femur neck.   She began Fosamax in 10/2015. Continue calcium and vitamin D. 5.  SIADH Sodium is 133 (improved). She is followed by Dr. Holley Raring. 6.  HYPERcalcemia, resolved  Calcium is 8.8. Continue to monitor. 7.   B12 deficiency             B12 was 662 and folate 8.2 on 11/21/2019.             Continue oral B12.             Check labs annually. 8.   Right mammogram 10/17/2020. 9.   RN:  Information on BCI testing. 10.   RTC in 6 months for MD assessment, labs (CBC with diff, CMP, CA27.29, folate), and review of mammogram.  I discussed the assessment and treatment plan with the patient.  The patient was provided an opportunity to ask questions and all were answered.  The patient agreed with the plan and demonstrated an understanding of the instructions.  The patient was advised to call back if the symptoms worsen or if the condition fails to improve as anticipated.  I provided 20 minutes of face-to-face time during this this encounter and > 50% was spent counseling as documented under my assessment and plan.  An additional 10 minutes were spent reviewing her chart  (Epic and Care Everywhere) including notes, labs, and imaging studies.    Lequita Asal, MD, PhD  05/22/2020, 11:59 AM  I, Mirian Mo Tufford, am acting as Education administrator for [employer]. Mike Gip, MD, PhD.  I, Roxanne Orner C. Mike Gip, MD, have reviewed the above documentation for accuracy and completeness, and I agree with the above.

## 2020-05-22 ENCOUNTER — Other Ambulatory Visit: Payer: Self-pay

## 2020-05-22 ENCOUNTER — Inpatient Hospital Stay: Payer: PPO | Attending: Hematology and Oncology

## 2020-05-22 ENCOUNTER — Inpatient Hospital Stay: Payer: PPO | Attending: Hematology and Oncology | Admitting: Hematology and Oncology

## 2020-05-22 ENCOUNTER — Encounter: Payer: Self-pay | Admitting: Hematology and Oncology

## 2020-05-22 VITALS — BP 162/67 | HR 52 | Temp 98.0°F | Resp 18 | Wt 150.4 lb

## 2020-05-22 DIAGNOSIS — K219 Gastro-esophageal reflux disease without esophagitis: Secondary | ICD-10-CM | POA: Diagnosis not present

## 2020-05-22 DIAGNOSIS — Z79899 Other long term (current) drug therapy: Secondary | ICD-10-CM | POA: Insufficient documentation

## 2020-05-22 DIAGNOSIS — E538 Deficiency of other specified B group vitamins: Secondary | ICD-10-CM | POA: Diagnosis not present

## 2020-05-22 DIAGNOSIS — C50411 Malignant neoplasm of upper-outer quadrant of right female breast: Secondary | ICD-10-CM | POA: Diagnosis not present

## 2020-05-22 DIAGNOSIS — J449 Chronic obstructive pulmonary disease, unspecified: Secondary | ICD-10-CM | POA: Insufficient documentation

## 2020-05-22 DIAGNOSIS — C50412 Malignant neoplasm of upper-outer quadrant of left female breast: Secondary | ICD-10-CM

## 2020-05-22 DIAGNOSIS — M349 Systemic sclerosis, unspecified: Secondary | ICD-10-CM | POA: Diagnosis not present

## 2020-05-22 DIAGNOSIS — M8588 Other specified disorders of bone density and structure, other site: Secondary | ICD-10-CM | POA: Diagnosis not present

## 2020-05-22 DIAGNOSIS — D631 Anemia in chronic kidney disease: Secondary | ICD-10-CM | POA: Insufficient documentation

## 2020-05-22 DIAGNOSIS — C50912 Malignant neoplasm of unspecified site of left female breast: Secondary | ICD-10-CM | POA: Insufficient documentation

## 2020-05-22 DIAGNOSIS — N189 Chronic kidney disease, unspecified: Secondary | ICD-10-CM

## 2020-05-22 DIAGNOSIS — M8589 Other specified disorders of bone density and structure, multiple sites: Secondary | ICD-10-CM | POA: Diagnosis not present

## 2020-05-22 DIAGNOSIS — I129 Hypertensive chronic kidney disease with stage 1 through stage 4 chronic kidney disease, or unspecified chronic kidney disease: Secondary | ICD-10-CM | POA: Diagnosis not present

## 2020-05-22 DIAGNOSIS — I252 Old myocardial infarction: Secondary | ICD-10-CM | POA: Insufficient documentation

## 2020-05-22 LAB — COMPREHENSIVE METABOLIC PANEL
ALT: 10 U/L (ref 0–44)
AST: 20 U/L (ref 15–41)
Albumin: 4 g/dL (ref 3.5–5.0)
Alkaline Phosphatase: 42 U/L (ref 38–126)
Anion gap: 8 (ref 5–15)
BUN: 18 mg/dL (ref 8–23)
CO2: 24 mmol/L (ref 22–32)
Calcium: 8.8 mg/dL — ABNORMAL LOW (ref 8.9–10.3)
Chloride: 101 mmol/L (ref 98–111)
Creatinine, Ser: 1.29 mg/dL — ABNORMAL HIGH (ref 0.44–1.00)
GFR, Estimated: 43 mL/min — ABNORMAL LOW (ref >60)
Glucose, Bld: 100 mg/dL — ABNORMAL HIGH (ref 70–99)
Potassium: 4.3 mmol/L (ref 3.5–5.1)
Sodium: 133 mmol/L — ABNORMAL LOW (ref 135–145)
Total Bilirubin: 0.3 mg/dL (ref 0.3–1.2)
Total Protein: 8.1 g/dL (ref 6.5–8.1)

## 2020-05-22 LAB — CBC WITH DIFFERENTIAL/PLATELET
Abs Immature Granulocytes: 0.02 10*3/uL (ref 0.00–0.07)
Basophils Absolute: 0.1 10*3/uL (ref 0.0–0.1)
Basophils Relative: 1 %
Eosinophils Absolute: 0.2 10*3/uL (ref 0.0–0.5)
Eosinophils Relative: 3 %
HCT: 32.1 % — ABNORMAL LOW (ref 36.0–46.0)
Hemoglobin: 10.6 g/dL — ABNORMAL LOW (ref 12.0–15.0)
Immature Granulocytes: 0 %
Lymphocytes Relative: 22 %
Lymphs Abs: 1.2 10*3/uL (ref 0.7–4.0)
MCH: 33.4 pg (ref 26.0–34.0)
MCHC: 33 g/dL (ref 30.0–36.0)
MCV: 101.3 fL — ABNORMAL HIGH (ref 80.0–100.0)
Monocytes Absolute: 0.8 10*3/uL (ref 0.1–1.0)
Monocytes Relative: 15 %
Neutro Abs: 3.2 10*3/uL (ref 1.7–7.7)
Neutrophils Relative %: 59 %
Platelets: 213 10*3/uL (ref 150–400)
RBC: 3.17 MIL/uL — ABNORMAL LOW (ref 3.87–5.11)
RDW: 13.3 % (ref 11.5–15.5)
WBC: 5.5 10*3/uL (ref 4.0–10.5)
nRBC: 0 % (ref 0.0–0.2)

## 2020-05-23 ENCOUNTER — Telehealth: Payer: Self-pay | Admitting: Family Medicine

## 2020-05-23 LAB — CANCER ANTIGEN 27.29: CA 27.29: 17.2 U/mL (ref 0.0–38.6)

## 2020-05-23 NOTE — Telephone Encounter (Signed)
Pt got both vaccines and the booster of the [facility] Dept.

## 2020-06-18 ENCOUNTER — Telehealth: Payer: Self-pay

## 2020-06-18 NOTE — Telephone Encounter (Signed)
Called and left a message for call back to scheduled EGD. Sent mychart message

## 2020-06-18 NOTE — Telephone Encounter (Signed)
-----   Message from Virgel Manifold, MD sent at 06/06/2020 11:36 AM EST ----- See patient's chart, and her pathology results from 2020.  Plan was to repeat upper endoscopy for gastric mapping biopsies in 2 years after her last upper endoscopy which was in January 2020.  If patient is agreeable, please schedule her

## 2020-07-04 NOTE — Progress Notes (Signed)
Cardiology Office Note  Date:  07/05/2020   ID:  Bianca Shaw, DOB 08/29/1945, MRN [id number]  PCP:  Bianca Hartigan, MD   Chief Complaint  Patient presents with  . 12 month follow up     "doing well." Medications reviewed by the patient verbally.     HPI:  Bianca Shaw is a 75 year old woman with history of  smoking,1 ppd,  40 years, COPD breast cancer on the left with mastectomy, no XRT hyponatremia, low potassium hypertension  in hospital  troponin 1.47 in the setting of left-sided chest pain on 07/15/2016.  outpt cardiac cath , no CAD, demand ischemia secondary to N/V, calcified LAD CT scan chest  Hx of low sodium significant coronary calcifications particularly in the LAD, and aorta She presents for follow-up of her chest pain  LOV 05/2019 Sedentary, legs weak,  Active in garden, in the summer No exercise program in particular  Denies any significant chest pain symptoms, shortness of breath on exertion Blood pressure running high 140 up to 150 at home Little bit of medication confusion, Amlodipine previously on her list, does not appear to be taking this  Labs reviewed: HGb 10.4, on iron, one a day CR 1.3 HGBA1C 5.7  EKG personally reviewed by myself on todays visit NSR rate 51 bpm, no st or t wave changes  Other past medical hx reviewed  echocardiogram and cardiac catheterization results, CT scan   Previous hospitalization March 2018 Stressors included dry heaves, nausea for 3-4 days prior to admission  left side chest pain, SOB significant diarrhea, potassium 2.9, sodium 123  CT scan chest in the hospital significant coronary calcifications particularly in the LAD, and aorta  07/16/16 Left ventricle: ejection fraction was in the range of 60% to 65%. Wall motion was normal Mitral valve: There was mild to moderate regurgitation. Left atrium: The atrium was mildly dilated. Pulmonary arteries: PA peak pressure: 53 mm Hg (S).  Cath 07/22/16 No  hemodynamically significant stenoses Normal right heart pressures Diffuse calcified plaque, predominantly in the LAD Normal LV function ejection fraction greater than 55%    PMH:   has a past medical history of Anemia, Anxiety disorder, Breast cancer (Bianca Shaw) (12/03/2015), Breast cancer of upper-outer quadrant of left female breast (Bianca Shaw) (11/2015), Cancer (Bianca Shaw) (12/03/2015), Chronic kidney disease, COPD (chronic obstructive pulmonary disease) (Bianca Shaw), Cough, Dyspnea, Family history of adverse reaction to anesthesia, GERD (gastroesophageal reflux disease), Hypertension, Myocardial infarction (Bianca Shaw), Osteopenia, Osteoporosis, Pericarditis, Personal history of tobacco use, presenting hazards to health (10/31/2015), Scleroderma (Bianca Shaw), UTI (lower urinary tract infection), and Wears dentures.  PSH:    Past Surgical History:  Procedure Laterality Date  . BREAST BIOPSY Right 2012   core - neg  . BREAST BIOPSY Left 12/03/2015   INVASIVE LOBULAR CARCINOMA.   Marland Kitchen CARDIAC CATHETERIZATION    . CATARACT EXTRACTION W/ INTRAOCULAR LENS IMPLANT Right   . CATARACT EXTRACTION W/PHACO Left 11/17/2018   Procedure: CATARACT EXTRACTION PHACO AND INTRAOCULAR LENS PLACEMENT;  Surgeon: Marchia Meiers, MD;  Location: [facility];  Service: Ophthalmology;  Laterality: Left;  [place] 00:55 CDE 11.04 Fluid Pack Lot # [id number]  . COLONOSCOPY WITH PROPOFOL N/A 11/08/2015   Procedure: COLONOSCOPY WITH PROPOFOL;  Surgeon: Lucilla Lame, MD;  Location: [place];  Service: Endoscopy;  Laterality: N/A;  . CORONARY ANGIOPLASTY    . ESOPHAGOGASTRODUODENOSCOPY (EGD) WITH PROPOFOL N/A 11/18/2017   Procedure: ESOPHAGOGASTRODUODENOSCOPY (EGD) WITH PROPOFOL;  Surgeon: Virgel Manifold, MD;  Location: [facility];  Service: Endoscopy;  Laterality: N/A;  .  ESOPHAGOGASTRODUODENOSCOPY (EGD) WITH PROPOFOL N/A 02/15/2018   Procedure: ESOPHAGOGASTRODUODENOSCOPY (EGD) WITH PROPOFOL;  Surgeon: Jonathon Bellows, MD;  Location: [facility];  Service:  Gastroenterology;  Laterality: N/A;  . ESOPHAGOGASTRODUODENOSCOPY (EGD) WITH PROPOFOL N/A 05/10/2018   Procedure: ESOPHAGOGASTRODUODENOSCOPY (EGD) WITH BIOPSIES;  Surgeon: Virgel Manifold, MD;  Location: [place];  Service: Endoscopy;  Laterality: N/A;  . EVACUATION BREAST HEMATOMA Left 01/14/2016   Procedure: EVACUATION HEMATOMA BREAST;  Surgeon: Robert Bellow, MD;  Location: [facility];  Service: General;  Laterality: Left;  . EYE SURGERY    . MASTECTOMY Left 2017   complete mastectomy  . MASTECTOMY W/ SENTINEL NODE BIOPSY Left 12/26/2015   Procedure: MASTECTOMY WITH SENTINEL LYMPH NODE BIOPSY;  Surgeon: Robert Bellow, MD;  Location: [facility];  Service: General;  Laterality: Left;  . RIGHT/LEFT HEART CATH AND CORONARY ANGIOGRAPHY N/A 07/22/2016   Procedure: Right/Left Heart Cath and Coronary Angiography;  Surgeon: Minna Merritts, MD;  Location: [place] [facility];  Service: Cardiovascular;  Laterality: N/A;  . TUBAL LIGATION    . VESICOVAGINAL FISTULA CLOSURE W/ TAH      Current Outpatient Medications  Medication Sig Dispense Refill  . albuterol (PROVENTIL) (2.5 MG/3ML) 0.083% nebulizer solution Take 3 mLs (2.5 mg total) by nebulization every 6 (six) hours as needed for wheezing or shortness of breath. 75 mL 12  . amLODipine (NORVASC) 2.5 MG tablet Was not on her list but has been restarted today Take 1 tablet (2.5 mg total) by mouth daily. 30 tablet 0  . Cyanocobalamin (B-12) 5000 MCG CAPS Take 1,000 mcg by mouth daily.     Marland Kitchen ezetimibe (ZETIA) 10 MG tablet Take 1 tablet (10 mg total) by mouth daily. 90 tablet 3  . Ferrous Sulfate (IRON PO) Take 65 mg of iron by mouth. Taking 1 tablet daily    . fluticasone (FLONASE) 50 MCG/ACT nasal spray Place 2 sprays into both nostrils daily. 16 g 0  . furosemide (LASIX) 20 MG tablet Take 20 mg by mouth daily.     Marland Kitchen GARLIC PO Take 1 tablet by mouth daily.    . hydrALAZINE (APRESOLINE) 25 MG tablet Take 1 tablet (25 mg total) by  mouth 2 (two) times daily. 60 tablet 11  . KRILL OIL PO Take 1 capsule by mouth daily.    Marland Kitchen lisinopril (PRINIVIL,ZESTRIL) 20 MG tablet Take 1 tablet (20 mg total) by mouth daily. 30 tablet 2  . metoprolol succinate (TOPROL-XL) 25 MG 24 hr tablet Take 1 tablet (25 mg total) by mouth daily. 90 tablet 3  . nitroGLYCERIN (NITROSTAT) 0.4 MG SL tablet Place 1 tablet (0.4 mg total) under the tongue every 5 (five) minutes as needed for chest pain. 30 tablet 0  . ondansetron (ZOFRAN ODT) 4 MG disintegrating tablet Take 1 tablet (4 mg total) by mouth every 8 (eight) hours as needed. 20 tablet 1  . potassium chloride SA (K-DUR,KLOR-CON) 20 MEQ tablet Take 1 tablet (20 mEq total) by mouth daily. 90 tablet 2  . sodium chloride 1 g tablet Take 1 g by mouth daily.    . tamoxifen (NOLVADEX) 20 MG tablet Take 1 tablet (20 mg total) by mouth daily. 90 tablet 0  . tamoxifen (NOLVADEX) 20 MG tablet Take 1 tablet (20 mg total) by mouth daily. 90 tablet 3  . simvastatin (ZOCOR) 40 MG tablet Take 1 tablet (40 mg total) by mouth daily. 90 tablet 3   No current facility-administered medications for this visit.  Allergies:   Pimenta and Tomato   Social History:  The patient  reports that she quit smoking about 16 years ago. Her smoking use included cigarettes. She has a 30.00 pack-year smoking history. She has never used smokeless tobacco. She reports current alcohol use of about 1.0 - 3.0 standard drink of alcohol per week. She reports that she does not use drugs.   Family History:   family history includes Anxiety disorder in her sister; Arthritis in her brother and sister; COPD in her brother, brother, and father; Diabetes in an other family member; Epilepsy in her mother; Heart attack in her paternal grandfather; Heart disease in her brother and father; Heart failure in her mother; Kidney failure in her brother; Uterine cancer in an other family member; Vaginal cancer in her paternal grandmother.    Review of  Systems: Review of Systems  Constitutional: Negative.   Respiratory: Negative.   Cardiovascular: Negative.   Gastrointestinal: Negative.   Musculoskeletal: Negative.   Neurological: Negative.   Psychiatric/Behavioral: Negative.   All other systems reviewed and are negative.   PHYSICAL EXAM: VS:  BP (!) 160/82 (BP Location: Left Arm, Patient Position: Sitting, Cuff Size: Normal)   Pulse (!) 51   Ht 5\' 1"  (1.549 m)   Wt 149 lb 8 oz (67.8 kg)   SpO2 98%   BMI 28.25 kg/m  , BMI Body mass index is 28.25 kg/m. Constitutional:  oriented to person, place, and time. No distress.  HENT:  Head: Grossly normal Eyes:  no discharge. No scleral icterus.  Neck: No JVD, no carotid bruits  Cardiovascular: Regular rate and rhythm, no murmurs appreciated Pulmonary/Chest: Clear to auscultation bilaterally, no wheezes or rails Abdominal: Soft.  no distension.  no tenderness.  Musculoskeletal: Normal range of motion Neurological:  normal muscle tone. Coordination normal. No atrophy Skin: Skin warm and dry Psychiatric: normal affect, pleasant   Recent Labs: 05/22/2020: ALT 10; BUN 18; Creatinine, Ser 1.29; Hemoglobin 10.6; Platelets 213; Potassium 4.3; Sodium 133    Lipid Panel Lab Results  Component Value Date   CHOL 147 06/05/2019   HDL 53 06/05/2019   LDLCALC 75 06/05/2019   TRIG 101 06/05/2019      Wt Readings from Last 3 Encounters:  07/05/20 149 lb 8 oz (67.8 kg)  05/22/20 150 lb 5.7 oz (68.2 kg)  11/21/19 148 lb 12.3 oz (67.5 kg)    ASSESSMENT AND PLAN:  Essential (primary) hypertension BP elevated today, As on several past clinic visits, running 563 up to 875 systolic at home per her records Recommended she increase lisinopril up to 40 daily, Restart amlodipine 2.5 daily Recommend she call with blood pressure numbers  Non-ST elevation (NSTEMI) myocardial infarction [facility])  cardiac catheterization, no obstructive lesions, significant coronary calcification in the LAD  particularly Recommend she stay on her simvastatin, cholesterol close to goal  Hypercholesteremia heavy calcification LAD Continue statin  Anxiety Recommend walking/exercise program  Hyponatremia Several hospitalizations February and March 2019 Avoiding free water, on sodium tabs, followed by nephrology  Smoker Reports that she is not a smoker currently Cessation recommended  Shortness of breath Moderately elevated right heart pressures on echocardiogram on in the hospital 2018 Continue Lasix potassium   Total encounter time more than 25 minutes  Greater than 50% was spent in counseling and coordination of care with the patient    Orders Placed This Encounter  Procedures  . EKG 12-Lead     Signed, Esmond Plants, M.D., Ph.D. 07/05/2020  [place]  [place], [place]

## 2020-07-05 ENCOUNTER — Encounter: Payer: Self-pay | Admitting: Cardiovascular Disease

## 2020-07-05 ENCOUNTER — Telehealth: Payer: Self-pay | Admitting: Cardiovascular Disease

## 2020-07-05 ENCOUNTER — Ambulatory Visit (INDEPENDENT_AMBULATORY_CARE_PROVIDER_SITE_OTHER): Payer: PPO | Admitting: Cardiovascular Disease

## 2020-07-05 ENCOUNTER — Other Ambulatory Visit: Payer: Self-pay

## 2020-07-05 VITALS — BP 160/82 | HR 51 | Ht 61.0 in | Wt 149.5 lb

## 2020-07-05 DIAGNOSIS — R0602 Shortness of breath: Secondary | ICD-10-CM | POA: Diagnosis not present

## 2020-07-05 DIAGNOSIS — I10 Essential (primary) hypertension: Secondary | ICD-10-CM

## 2020-07-05 DIAGNOSIS — N189 Chronic kidney disease, unspecified: Secondary | ICD-10-CM | POA: Diagnosis not present

## 2020-07-05 DIAGNOSIS — D631 Anemia in chronic kidney disease: Secondary | ICD-10-CM | POA: Diagnosis not present

## 2020-07-05 DIAGNOSIS — J449 Chronic obstructive pulmonary disease, unspecified: Secondary | ICD-10-CM

## 2020-07-05 DIAGNOSIS — I25110 Atherosclerotic heart disease of native coronary artery with unstable angina pectoris: Secondary | ICD-10-CM

## 2020-07-05 MED ORDER — SIMVASTATIN 40 MG PO TABS: ORAL_TABLET | ORAL | 3 refills | Status: DC

## 2020-07-05 MED ORDER — AMLODIPINE BESYLATE 2.5 MG PO TABS: 2.5000 mg | ORAL_TABLET | Freq: Every day | ORAL | 3 refills | Status: AC

## 2020-07-05 MED ORDER — METOPROLOL SUCCINATE ER 25 MG PO TB24: 25.0000 mg | ORAL_TABLET | Freq: Every day | ORAL | 3 refills | Status: AC

## 2020-07-05 MED ORDER — LISINOPRIL 20 MG PO TABS: 20.0000 mg | ORAL_TABLET | Freq: Every day | ORAL | 3 refills | Status: DC

## 2020-07-05 MED ORDER — HYDRALAZINE HCL 25 MG PO TABS: 25.0000 mg | ORAL_TABLET | Freq: Two times a day (BID) | ORAL | 3 refills | Status: DC

## 2020-07-05 MED ORDER — NITROGLYCERIN 0.4 MG SL SUBL
0.4000 mg | SUBLINGUAL_TABLET | SUBLINGUAL | 3 refills | Status: AC | PRN
Start: 2020-07-05 — End: ?

## 2020-07-05 MED ORDER — POTASSIUM CHLORIDE CRYS ER 20 MEQ PO TBCR: 20.0000 meq | EXTENDED_RELEASE_TABLET | Freq: Every day | ORAL | 3 refills | Status: DC

## 2020-07-05 MED ORDER — LISINOPRIL 20 MG PO TABS: 40.0000 mg | ORAL_TABLET | Freq: Every day | ORAL | 3 refills | Status: AC

## 2020-07-05 MED ORDER — FUROSEMIDE 20 MG PO TABS: 20.0000 mg | ORAL_TABLET | Freq: Every day | ORAL | 3 refills | Status: AC

## 2020-07-05 NOTE — Telephone Encounter (Signed)
Called Bianca Shaw, she was grocery shopping so spoke with her husband, advised that her scrip for Lisinopril was for 40 mg once a day. Her pharmacy had called with concerning scripts and wanted to clarify with pt that she is to have the 40 mg filled and this is increasing her dose from the 20 mg as discuss in clinic today at her visit. Mr. Harrigan verbalized understanding, they will be picking up medication in just a few.   [facility] and spoke to pharmacist to clarify the correct dosing to fill. Lisinopril 40 mg Daily, verbal read back given for confirmation.

## 2020-07-05 NOTE — Patient Instructions (Addendum)
Medication Instructions:  For blood pressure:  Please restart amlodipine 2.5 daily  Increase the lisinopril up to 40 mg daily  Lab work: No new labs needed  Testing/Procedures: No new testing needed  Follow-Up:   . You will need a follow up appointment in 12 months  . Providers on your designated Care Team:   . Murray Hodgkins, NP . Christell Faith, PA-C . Marrianne Mood, PA-C  Please monitor blood pressures and keep a log of your readings.  Upload to your MyChart 2-3 weeks with BP readings.   How to use a home blood pressure monitor. . Be still. . Measure at the same time every day. It's important to take the readings at the same time each day, such as morning and evening. Take reading approximately 1 1/2 to 2 hours after BP medications.   AVOID these things for 30 minutes before checking your blood pressure:  Drinking caffeine.  Drinking alcohol.  Eating.  Smoking.  Exercising.   Five minutes before checking your blood pressure:  Pee.  Sit in a dining chair. Avoid sitting in a soft couch or armchair.  Be quiet. Do not talk.       Sit correctly. Sit with your back straight and supported (on a dining chair, rather than a sofa). Your feet should be flat on the floor and your legs should not be crossed. Your arm should be supported on a flat surface (such as a table) with the upper arm at heart level. Make sure the bottom of the cuff is placed directly above the bend of the elbow.

## 2020-07-05 NOTE — Telephone Encounter (Signed)
Paige with [place] calling They received two prescriptions today for lisinopril 20 MG - need to clarify which is correct Please call to discuss

## 2020-07-30 DIAGNOSIS — N1832 Chronic kidney disease, stage 3b: Secondary | ICD-10-CM | POA: Diagnosis not present

## 2020-07-30 DIAGNOSIS — D631 Anemia in chronic kidney disease: Secondary | ICD-10-CM | POA: Diagnosis not present

## 2020-07-30 DIAGNOSIS — E871 Hypo-osmolality and hyponatremia: Secondary | ICD-10-CM | POA: Diagnosis not present

## 2020-07-30 DIAGNOSIS — R809 Proteinuria, unspecified: Secondary | ICD-10-CM | POA: Diagnosis not present

## 2020-07-30 DIAGNOSIS — I10 Essential (primary) hypertension: Secondary | ICD-10-CM | POA: Diagnosis not present

## 2020-07-31 NOTE — Telephone Encounter (Signed)
fyi   Patient spouse calling to touch base about improved bp since med change. Patient spouse reports no issues with med change.

## 2020-11-19 ENCOUNTER — Other Ambulatory Visit: Payer: Self-pay | Admitting: *Deleted

## 2020-11-19 ENCOUNTER — Other Ambulatory Visit: Payer: Self-pay

## 2020-11-19 ENCOUNTER — Encounter: Payer: Self-pay | Admitting: *Deleted

## 2020-11-19 ENCOUNTER — Encounter: Payer: Self-pay | Admitting: Internal Medicine

## 2020-11-19 ENCOUNTER — Inpatient Hospital Stay: Payer: PPO | Attending: Hematology and Oncology | Admitting: Internal Medicine

## 2020-11-19 ENCOUNTER — Inpatient Hospital Stay: Payer: PPO

## 2020-11-19 VITALS — BP 164/68 | HR 55 | Temp 98.0°F | Resp 20 | Ht 61.0 in | Wt 150.4 lb

## 2020-11-19 DIAGNOSIS — C50412 Malignant neoplasm of upper-outer quadrant of left female breast: Secondary | ICD-10-CM

## 2020-11-19 DIAGNOSIS — I10 Essential (primary) hypertension: Secondary | ICD-10-CM | POA: Diagnosis not present

## 2020-11-19 DIAGNOSIS — Z170 Estrogen receptor positive status [ER+]: Secondary | ICD-10-CM

## 2020-11-19 DIAGNOSIS — I129 Hypertensive chronic kidney disease with stage 1 through stage 4 chronic kidney disease, or unspecified chronic kidney disease: Secondary | ICD-10-CM | POA: Insufficient documentation

## 2020-11-19 DIAGNOSIS — M8589 Other specified disorders of bone density and structure, multiple sites: Secondary | ICD-10-CM | POA: Diagnosis not present

## 2020-11-19 DIAGNOSIS — J449 Chronic obstructive pulmonary disease, unspecified: Secondary | ICD-10-CM | POA: Diagnosis not present

## 2020-11-19 DIAGNOSIS — R7302 Impaired glucose tolerance (oral): Secondary | ICD-10-CM | POA: Diagnosis not present

## 2020-11-19 DIAGNOSIS — M349 Systemic sclerosis, unspecified: Secondary | ICD-10-CM | POA: Diagnosis not present

## 2020-11-19 DIAGNOSIS — Z79899 Other long term (current) drug therapy: Secondary | ICD-10-CM | POA: Insufficient documentation

## 2020-11-19 DIAGNOSIS — D631 Anemia in chronic kidney disease: Secondary | ICD-10-CM | POA: Diagnosis not present

## 2020-11-19 DIAGNOSIS — C50912 Malignant neoplasm of unspecified site of left female breast: Secondary | ICD-10-CM | POA: Diagnosis not present

## 2020-11-19 DIAGNOSIS — Z0000 Encounter for general adult medical examination without abnormal findings: Secondary | ICD-10-CM | POA: Diagnosis not present

## 2020-11-19 DIAGNOSIS — E871 Hypo-osmolality and hyponatremia: Secondary | ICD-10-CM | POA: Diagnosis not present

## 2020-11-19 DIAGNOSIS — I25119 Atherosclerotic heart disease of native coronary artery with unspecified angina pectoris: Secondary | ICD-10-CM | POA: Diagnosis not present

## 2020-11-19 DIAGNOSIS — E7800 Pure hypercholesterolemia, unspecified: Secondary | ICD-10-CM | POA: Diagnosis not present

## 2020-11-19 DIAGNOSIS — N1832 Chronic kidney disease, stage 3b: Secondary | ICD-10-CM | POA: Diagnosis not present

## 2020-11-19 LAB — CBC WITH DIFFERENTIAL/PLATELET
Abs Immature Granulocytes: 0.01 10*3/uL (ref 0.00–0.07)
Basophils Absolute: 0.1 10*3/uL (ref 0.0–0.1)
Basophils Relative: 1 %
Eosinophils Absolute: 0.2 10*3/uL (ref 0.0–0.5)
Eosinophils Relative: 4 %
HCT: 30.7 % — ABNORMAL LOW (ref 36.0–46.0)
Hemoglobin: 10.5 g/dL — ABNORMAL LOW (ref 12.0–15.0)
Immature Granulocytes: 0 %
Lymphocytes Relative: 24 %
Lymphs Abs: 1.4 10*3/uL (ref 0.7–4.0)
MCH: 34.4 pg — ABNORMAL HIGH (ref 26.0–34.0)
MCHC: 34.2 g/dL (ref 30.0–36.0)
MCV: 100.7 fL — ABNORMAL HIGH (ref 80.0–100.0)
Monocytes Absolute: 0.9 10*3/uL (ref 0.1–1.0)
Monocytes Relative: 16 %
Neutro Abs: 3.3 10*3/uL (ref 1.7–7.7)
Neutrophils Relative %: 55 %
Platelets: 244 10*3/uL (ref 150–400)
RBC: 3.05 MIL/uL — ABNORMAL LOW (ref 3.87–5.11)
RDW: 12.5 % (ref 11.5–15.5)
WBC: 6 10*3/uL (ref 4.0–10.5)
nRBC: 0 % (ref 0.0–0.2)

## 2020-11-19 LAB — COMPREHENSIVE METABOLIC PANEL
ALT: 11 U/L (ref 0–44)
AST: 21 U/L (ref 15–41)
Albumin: 3.9 g/dL (ref 3.5–5.0)
Alkaline Phosphatase: 38 U/L (ref 38–126)
Anion gap: 6 (ref 5–15)
BUN: 17 mg/dL (ref 8–23)
CO2: 25 mmol/L (ref 22–32)
Calcium: 9.2 mg/dL (ref 8.9–10.3)
Chloride: 99 mmol/L (ref 98–111)
Creatinine, Ser: 1.22 mg/dL — ABNORMAL HIGH (ref 0.44–1.00)
GFR, Estimated: 46 mL/min — ABNORMAL LOW (ref >60)
Glucose, Bld: 89 mg/dL (ref 70–99)
Potassium: 4.6 mmol/L (ref 3.5–5.1)
Sodium: 130 mmol/L — ABNORMAL LOW (ref 135–145)
Total Bilirubin: 0.4 mg/dL (ref 0.3–1.2)
Total Protein: 8 g/dL (ref 6.5–8.1)

## 2020-11-19 LAB — FOLATE: Folate: 9 ng/mL (ref >5.9)

## 2020-11-19 NOTE — Assessment & Plan Note (Addendum)
#   Stage IIA left breast cancer- Invasive lobular carcinoma; 4.2 cm lesion; T2, N1 (iso).; ER/ PR positive; started tamoxifen October 2017[? Ext Tam]; clinically no evidence of recurrence.  Continue tamoxifen for now.  #Anemia secondary CKD hemoglobin ~10; continue close monitoring.  #Osteopenia August 2021-continue Fosamax calcium plus vitamin D  #SIADH/chronic kidney disease [Dr.Lateef]. STABLE>   #DISPOSITION: # follow up in 6 months- MD; labs- cbc/cmp;ca-27-29-Dr.B

## 2020-11-19 NOTE — Progress Notes (Signed)
[place] NOTE  Patient Care Team: Sofie Hartigan, MD as PCP - General (Family Medicine) Bary Castilla, Forest Gleason, MD (General Surgery) Anthonette Legato, MD as Consulting Physician (Internal Medicine) Jannet Mantis, MD as Consulting Physician (Dermatology) Lequita Asal, MD (Inactive) as Referring Physician (Hematology and Oncology) Anell Barr, OD as Consulting Physician (Optometry) Rockey Situ, Kathlene November, MD as Consulting Physician (Cardiology) Gabriel Carina Betsey Holiday, MD as Physician Assistant (Endocrinology) Fredirick Maudlin, MD as Consulting Physician (General Surgery)  CHIEF COMPLAINTS/PURPOSE OF CONSULTATION: Breast cancer   Oncology History   No history exists.     HISTORY OF PRESENTING ILLNESS:  Bianca Shaw 75 y.o.  female invasive lobular breast cancer T2N1 ER/PR positive HER2 negative on tamoxifen is here for follow-up.  Patient denies any unusual hot flashes or joint pains.  Any nausea vomiting.  No blood in stools black or stools.  Denies any vaginal bleeding.  Review of Systems  Constitutional:  Negative for chills, diaphoresis, fever, malaise/fatigue and weight loss.  HENT:  Negative for nosebleeds and sore throat.   Eyes:  Negative for double vision.  Respiratory:  Negative for cough, hemoptysis, sputum production, shortness of breath and wheezing.   Cardiovascular:  Negative for chest pain, palpitations, orthopnea and leg swelling.  Gastrointestinal:  Negative for abdominal pain, blood in stool, constipation, diarrhea, heartburn, melena, nausea and vomiting.  Genitourinary:  Negative for dysuria, frequency and urgency.  Musculoskeletal:  Positive for joint pain. Negative for back pain.  Skin: Negative.  Negative for itching and rash.  Neurological:  Negative for dizziness, tingling, focal weakness, weakness and headaches.  Endo/Heme/Allergies:  Does not bruise/bleed easily.  Psychiatric/Behavioral:  Negative for depression. The  patient is not nervous/anxious and does not have insomnia.     MEDICAL HISTORY:  Past Medical History:  Diagnosis Date   Anemia    Anxiety disorder    Breast cancer ([place]) 12/03/2015   lt breast   Breast cancer of upper-outer quadrant of left female breast ([place]) 11/2015   pT2 pN0(i+).;ER+; PR +, her 2 neu not overexpressed.  Mastectomy, SLN, Mammoprint: Low risk.    Cancer ([place]) 12/03/2015   left breast/ INVASIVE LOBULAR CARCINOMA.    Chronic kidney disease    STAGE 3   COPD (chronic obstructive pulmonary disease) (H[facility])    MILD   Cough    lingering, mild, finished Prednisone and anitbiotic 11/03/15   Dyspnea    DOE   Family history of adverse reaction to anesthesia    sister - PONV   GERD (gastroesophageal reflux disease)    Hypertension    Myocardial infarction ([place])    Osteopenia    Osteoporosis    Pericarditis    diagnonsed June, 2010, unclear etiology as of yer   Personal history of tobacco use, presenting hazards to health 10/31/2015   Scleroderma ([place])    ONLY ON SKIN-MILD   UTI (lower urinary tract infection)    Wears dentures    full upper    SURGICAL HISTORY: Past Surgical History:  Procedure Laterality Date   BREAST BIOPSY Right 2012   core - neg   BREAST BIOPSY Left 12/03/2015   INVASIVE LOBULAR CARCINOMA.    CARDIAC CATHETERIZATION     CATARACT EXTRACTION W/ INTRAOCULAR LENS IMPLANT Right    CATARACT EXTRACTION W/PHACO Left 11/17/2018   Procedure: CATARACT EXTRACTION PHACO AND INTRAOCULAR LENS PLACEMENT;  Surgeon: Marchia Meiers, MD;  Location: [facility];  Service: Ophthalmology;  Laterality: Left;  [place] 00:55 CDE 11.04  Fluid Pack Lot # [id number]   COLONOSCOPY WITH PROPOFOL N/A 11/08/2015   Procedure: COLONOSCOPY WITH PROPOFOL;  Surgeon: Lucilla Lame, MD;  Location: [facility];  Service: Endoscopy;  Laterality: N/A;   CORONARY ANGIOPLASTY     ESOPHAGOGASTRODUODENOSCOPY (EGD) WITH PROPOFOL N/A 11/18/2017   Procedure: ESOPHAGOGASTRODUODENOSCOPY (EGD) WITH  PROPOFOL;  Surgeon: Virgel Manifold, MD;  Location: [facility];  Service: Endoscopy;  Laterality: N/A;   ESOPHAGOGASTRODUODENOSCOPY (EGD) WITH PROPOFOL N/A 02/15/2018   Procedure: ESOPHAGOGASTRODUODENOSCOPY (EGD) WITH PROPOFOL;  Surgeon: Jonathon Bellows, MD;  Location: [facility];  Service: Gastroenterology;  Laterality: N/A;   ESOPHAGOGASTRODUODENOSCOPY (EGD) WITH PROPOFOL N/A 05/10/2018   Procedure: ESOPHAGOGASTRODUODENOSCOPY (EGD) WITH BIOPSIES;  Surgeon: Virgel Manifold, MD;  Location: [facility];  Service: Endoscopy;  Laterality: N/A;   EVACUATION BREAST HEMATOMA Left 01/14/2016   Procedure: EVACUATION HEMATOMA BREAST;  Surgeon: Robert Bellow, MD;  Location: [facility];  Service: General;  Laterality: Left;   EYE SURGERY     MASTECTOMY Left 2017   complete mastectomy   MASTECTOMY W/ SENTINEL NODE BIOPSY Left 12/26/2015   Procedure: MASTECTOMY WITH SENTINEL LYMPH NODE BIOPSY;  Surgeon: Robert Bellow, MD;  Location: [facility];  Service: General;  Laterality: Left;   RIGHT/LEFT HEART CATH AND CORONARY ANGIOGRAPHY N/A 07/22/2016   Procedure: Right/Left Heart Cath and Coronary Angiography;  Surgeon: Minna Merritts, MD;  Location: [facility];  Service: Cardiovascular;  Laterality: N/A;   TUBAL LIGATION     VESICOVAGINAL FISTULA CLOSURE W/ TAH      SOCIAL HISTORY: Social History   Socioeconomic History   Marital status: Married    Spouse name: Not on file   Number of children: 2   Years of education: Not on file   Highest education level: Not on file  Occupational History   Not on file  Tobacco Use   Smoking status: Former    Packs/day: 0.75    Years: 40.00    Pack years: 30.00    Types: Cigarettes    Quit date: 11/07/2003    Years since quitting: 17.1   Smokeless tobacco: Never  Vaping Use   Vaping Use: Never used  Substance and Sexual Activity   Alcohol use: Yes    Alcohol/week: 1.0 - 3.0 standard drink    Types: 1 - 3 Glasses of wine per  week    Comment: Occasionally.     Drug use: No   Sexual activity: Not on file  Other Topics Concern   Not on file  Social History Narrative   She drinks 1 to 2 caffeinated beverages a day   Social Determinants of Health   Financial Resource Strain: Not on file  Food Insecurity: Not on file  Transportation Needs: Not on file  Physical Activity: Not on file  Stress: Not on file  Social Connections: Not on file  Intimate Partner Violence: Not on file    FAMILY HISTORY: Family History  Problem Relation Age of Onset   Heart failure Mother    Epilepsy Mother    COPD Father    Heart disease Father    Anxiety disorder Sister    Arthritis Brother    Heart disease Brother    Vaginal cancer Paternal Grandmother    Heart attack Paternal Grandfather    COPD Brother    Kidney failure Brother    COPD Brother    Arthritis Sister    Uterine cancer Other    Diabetes Other    Colon  cancer Neg Hx    Stomach cancer Neg Hx    Breast cancer Neg Hx     ALLERGIES:  is allergic to pimenta and tomato.  MEDICATIONS:  Current Outpatient Medications  Medication Sig Dispense Refill   albuterol (PROVENTIL) (2.5 MG/3ML) 0.083% nebulizer solution Take 3 mLs (2.5 mg total) by nebulization every 6 (six) hours as needed for wheezing or shortness of breath. 75 mL 12   amLODipine (NORVASC) 2.5 MG tablet Take 1 tablet (2.5 mg total) by mouth daily. 90 tablet 3   Cyanocobalamin (B-12) 5000 MCG CAPS Take 1,000 mcg by mouth daily.      ezetimibe (ZETIA) 10 MG tablet Take 1 tablet (10 mg total) by mouth daily. 90 tablet 3   Ferrous Sulfate (IRON PO) Take 65 mg of iron by mouth. Taking 1 tablet daily     fluticasone (FLONASE) 50 MCG/ACT nasal spray Place 2 sprays into both nostrils daily. 16 g 0   furosemide (LASIX) 20 MG tablet Take 1 tablet (20 mg total) by mouth daily. 90 tablet 3   GARLIC PO Take 1 tablet by mouth daily.     hydrALAZINE (APRESOLINE) 25 MG tablet Take 1 tablet (25 mg total) by mouth 2  (two) times daily. 180 tablet 3   KRILL OIL PO Take 1 capsule by mouth daily.     lisinopril (ZESTRIL) 20 MG tablet Take 2 tablets (40 mg total) by mouth daily. 180 tablet 3   metoprolol succinate (TOPROL-XL) 25 MG 24 hr tablet Take 1 tablet (25 mg total) by mouth daily. 90 tablet 3   nitroGLYCERIN (NITROSTAT) 0.4 MG SL tablet Place 1 tablet (0.4 mg total) under the tongue every 5 (five) minutes as needed for chest pain. 30 tablet 3   ondansetron (ZOFRAN ODT) 4 MG disintegrating tablet Take 1 tablet (4 mg total) by mouth every 8 (eight) hours as needed. 20 tablet 1   potassium chloride SA (KLOR-CON) 20 MEQ tablet Take 1 tablet (20 mEq total) by mouth daily. 90 tablet 3   simvastatin (ZOCOR) 40 MG tablet Take 1 tablet (40 mg total) by mouth daily. 90 tablet 3   sodium chloride 1 g tablet Take 1 g by mouth daily.     tamoxifen (NOLVADEX) 20 MG tablet Take 1 tablet (20 mg total) by mouth daily. 90 tablet 0   tamoxifen (NOLVADEX) 20 MG tablet Take 1 tablet (20 mg total) by mouth daily. 90 tablet 3   No current facility-administered medications for this visit.      Marland Kitchen  PHYSICAL EXAMINATION: ECOG PERFORMANCE STATUS: 1 - Symptomatic but completely ambulatory  Vitals:   11/19/20 1004  BP: (!) 164/68  Pulse: (!) 55  Resp: 20  Temp: 98 F (36.7 C)   Filed Weights   11/19/20 1004  Weight: 150 lb 5.7 oz (68.2 kg)    Physical Exam Vitals and nursing note reviewed.  Constitutional:      Comments:      HENT:     Head: Normocephalic and atraumatic.     Mouth/Throat:     Pharynx: Oropharynx is clear.  Eyes:     Extraocular Movements: Extraocular movements intact.     Pupils: Pupils are equal, round, and reactive to light.  Cardiovascular:     Rate and Rhythm: Normal rate and regular rhythm.  Pulmonary:     Comments: Decreased breath sounds bilaterally.  Abdominal:     Palpations: Abdomen is soft.  Musculoskeletal:        General: Normal  range of motion.     Cervical back: Normal  range of motion.  Skin:    General: Skin is warm.  Neurological:     General: No focal deficit present.     Mental Status: She is alert and oriented to person, place, and time.  Psychiatric:        Behavior: Behavior normal.        Judgment: Judgment normal.     LABORATORY DATA:  I have reviewed the data as listed Lab Results  Component Value Date   WBC 6.0 11/19/2020   HGB 10.5 (L) 11/19/2020   HCT 30.7 (L) 11/19/2020   MCV 100.7 (H) 11/19/2020   PLT 244 11/19/2020   Recent Labs    05/22/20 1049 11/19/20 0948  NA 133* 130*  K 4.3 4.6  CL 101 99  CO2 24 25  GLUCOSE 100* 89  BUN 18 17  CREATININE 1.29* 1.22*  CALCIUM 8.8* 9.2  GFRNONAA 43* 46*  PROT 8.1 8.0  ALBUMIN 4.0 3.9  AST 20 21  ALT 10 11  ALKPHOS 42 38  BILITOT 0.3 0.4    RADIOGRAPHIC STUDIES: I have personally reviewed the radiological images as listed and agreed with the findings in the report. MM 3D SCREEN BREAST UNI RIGHT  Result Date: 12/02/2020 CLINICAL DATA:  Screening. EXAM: DIGITAL SCREENING UNILATERAL RIGHT MAMMOGRAM WITH CAD AND TOMOSYNTHESIS TECHNIQUE: Right screening digital craniocaudal and mediolateral oblique mammograms were obtained. Right screening digital breast tomosynthesis was performed. The images were evaluated with computer-aided detection. COMPARISON:  Previous exam(s). ACR Breast Density Category c: The breast tissue is heterogeneously dense, which may obscure small masses. FINDINGS: There are no findings suspicious for malignancy. IMPRESSION: No mammographic evidence of malignancy. A result letter of this screening mammogram will be mailed directly to the patient. RECOMMENDATION: Screening mammogram in one year. (Code:SM-B-01Y) BI-RADS CATEGORY  1: Negative. Electronically Signed   By: Lovey Newcomer M.D.   On: 12/02/2020 08:12   ASSESSMENT & PLAN:   Carcinoma of upper-outer quadrant of left breast in female, estrogen receptor positive ([place]) # Stage IIA left breast cancer- Invasive  lobular carcinoma; 4.2 cm lesion; T2, N1 (iso).; ER/ PR positive; started tamoxifen October 2017[? Ext Tam]; clinically no evidence of recurrence.  Continue tamoxifen for now.  #Anemia secondary CKD hemoglobin ~10; continue close monitoring.  #Osteopenia August 2021-continue Fosamax calcium plus vitamin D  #SIADH/chronic kidney disease [Dr.Lateef]. STABLE>   #DISPOSITION: # follow up in 6 months- MD; labs- cbc/cmp;ca-27-29-Dr.B  All questions were answered. The patient knows to call the clinic with any problems, questions or concerns.    Cammie Sickle, MD 12/15/2020 11:02 PM

## 2020-11-20 LAB — CANCER ANTIGEN 27.29: CA 27.29: 8.7 U/mL (ref 0.0–38.6)

## 2020-11-22 DIAGNOSIS — C50412 Malignant neoplasm of upper-outer quadrant of left female breast: Secondary | ICD-10-CM | POA: Diagnosis not present

## 2020-11-22 DIAGNOSIS — R7302 Impaired glucose tolerance (oral): Secondary | ICD-10-CM | POA: Diagnosis not present

## 2020-11-22 DIAGNOSIS — E7800 Pure hypercholesterolemia, unspecified: Secondary | ICD-10-CM | POA: Diagnosis not present

## 2020-11-22 DIAGNOSIS — Z4432 Encounter for fitting and adjustment of external left breast prosthesis: Secondary | ICD-10-CM | POA: Diagnosis not present

## 2020-11-28 ENCOUNTER — Other Ambulatory Visit: Payer: Self-pay

## 2020-11-28 ENCOUNTER — Ambulatory Visit
Admission: RE | Admit: 2020-11-28 | Discharge: 2020-11-28 | Disposition: A | Discharge status: Home / Self Care | Payer: PPO | Source: Ambulatory Visit | Attending: Internal Medicine | Admitting: Internal Medicine

## 2020-11-28 DIAGNOSIS — Z1231 Encounter for screening mammogram for malignant neoplasm of breast: Secondary | ICD-10-CM | POA: Insufficient documentation

## 2020-11-28 DIAGNOSIS — C50412 Malignant neoplasm of upper-outer quadrant of left female breast: Secondary | ICD-10-CM | POA: Insufficient documentation

## 2020-12-15 ENCOUNTER — Encounter: Payer: Self-pay | Admitting: Hematology and Oncology

## 2020-12-19 DIAGNOSIS — L718 Other rosacea: Secondary | ICD-10-CM | POA: Diagnosis not present

## 2020-12-19 DIAGNOSIS — L570 Actinic keratosis: Secondary | ICD-10-CM | POA: Diagnosis not present

## 2021-01-13 DIAGNOSIS — I10 Essential (primary) hypertension: Secondary | ICD-10-CM | POA: Diagnosis not present

## 2021-01-13 DIAGNOSIS — R809 Proteinuria, unspecified: Secondary | ICD-10-CM | POA: Diagnosis not present

## 2021-01-13 DIAGNOSIS — N1832 Chronic kidney disease, stage 3b: Secondary | ICD-10-CM | POA: Diagnosis not present

## 2021-01-13 DIAGNOSIS — N2581 Secondary hyperparathyroidism of renal origin: Secondary | ICD-10-CM | POA: Diagnosis not present

## 2021-01-13 DIAGNOSIS — D631 Anemia in chronic kidney disease: Secondary | ICD-10-CM | POA: Diagnosis not present

## 2021-01-20 DIAGNOSIS — L718 Other rosacea: Secondary | ICD-10-CM | POA: Diagnosis not present

## 2021-01-22 ENCOUNTER — Other Ambulatory Visit: Payer: Self-pay | Admitting: Cardiovascular Disease

## 2021-02-13 ENCOUNTER — Encounter: Payer: Self-pay | Admitting: General Surgery

## 2021-02-20 ENCOUNTER — Other Ambulatory Visit: Payer: Self-pay | Admitting: *Deleted

## 2021-02-20 DIAGNOSIS — C50412 Malignant neoplasm of upper-outer quadrant of left female breast: Secondary | ICD-10-CM

## 2021-02-20 MED ORDER — TAMOXIFEN CITRATE 20 MG PO TABS: 20.0000 mg | ORAL_TABLET | Freq: Every day | ORAL | 0 refills | Status: DC

## 2021-04-25 DIAGNOSIS — I10 Essential (primary) hypertension: Secondary | ICD-10-CM | POA: Diagnosis not present

## 2021-04-25 DIAGNOSIS — M349 Systemic sclerosis, unspecified: Secondary | ICD-10-CM | POA: Diagnosis not present

## 2021-05-12 DIAGNOSIS — R809 Proteinuria, unspecified: Secondary | ICD-10-CM | POA: Diagnosis not present

## 2021-05-12 DIAGNOSIS — N1832 Chronic kidney disease, stage 3b: Secondary | ICD-10-CM | POA: Diagnosis not present

## 2021-05-12 DIAGNOSIS — I10 Essential (primary) hypertension: Secondary | ICD-10-CM | POA: Diagnosis not present

## 2021-05-12 DIAGNOSIS — D631 Anemia in chronic kidney disease: Secondary | ICD-10-CM | POA: Diagnosis not present

## 2021-05-12 DIAGNOSIS — E871 Hypo-osmolality and hyponatremia: Secondary | ICD-10-CM | POA: Diagnosis not present

## 2021-05-12 DIAGNOSIS — N2581 Secondary hyperparathyroidism of renal origin: Secondary | ICD-10-CM | POA: Diagnosis not present

## 2021-05-20 ENCOUNTER — Encounter: Payer: Self-pay | Admitting: Internal Medicine

## 2021-05-20 ENCOUNTER — Inpatient Hospital Stay: Payer: PPO | Attending: Internal Medicine

## 2021-05-20 ENCOUNTER — Inpatient Hospital Stay: Payer: PPO | Admitting: Internal Medicine

## 2021-05-20 ENCOUNTER — Other Ambulatory Visit: Payer: Self-pay

## 2021-05-20 DIAGNOSIS — E222 Syndrome of inappropriate secretion of antidiuretic hormone: Secondary | ICD-10-CM | POA: Diagnosis not present

## 2021-05-20 DIAGNOSIS — C50412 Malignant neoplasm of upper-outer quadrant of left female breast: Secondary | ICD-10-CM | POA: Diagnosis not present

## 2021-05-20 DIAGNOSIS — Z170 Estrogen receptor positive status [ER+]: Secondary | ICD-10-CM | POA: Diagnosis not present

## 2021-05-20 DIAGNOSIS — D631 Anemia in chronic kidney disease: Secondary | ICD-10-CM | POA: Insufficient documentation

## 2021-05-20 DIAGNOSIS — M8580 Other specified disorders of bone density and structure, unspecified site: Secondary | ICD-10-CM | POA: Diagnosis not present

## 2021-05-20 DIAGNOSIS — N183 Chronic kidney disease, stage 3 unspecified: Secondary | ICD-10-CM | POA: Insufficient documentation

## 2021-05-20 LAB — CBC WITH DIFFERENTIAL/PLATELET
Abs Immature Granulocytes: 0.02 10*3/uL (ref 0.00–0.07)
Basophils Absolute: 0.1 10*3/uL (ref 0.0–0.1)
Basophils Relative: 1 %
Eosinophils Absolute: 0.2 10*3/uL (ref 0.0–0.5)
Eosinophils Relative: 4 %
HCT: 33.5 % — ABNORMAL LOW (ref 36.0–46.0)
Hemoglobin: 11.3 g/dL — ABNORMAL LOW (ref 12.0–15.0)
Immature Granulocytes: 0 %
Lymphocytes Relative: 19 %
Lymphs Abs: 1.1 10*3/uL (ref 0.7–4.0)
MCH: 34.5 pg — ABNORMAL HIGH (ref 26.0–34.0)
MCHC: 33.7 g/dL (ref 30.0–36.0)
MCV: 102.1 fL — ABNORMAL HIGH (ref 80.0–100.0)
Monocytes Absolute: 0.7 10*3/uL (ref 0.1–1.0)
Monocytes Relative: 12 %
Neutro Abs: 3.6 10*3/uL (ref 1.7–7.7)
Neutrophils Relative %: 64 %
Platelets: 234 10*3/uL (ref 150–400)
RBC: 3.28 MIL/uL — ABNORMAL LOW (ref 3.87–5.11)
RDW: 12.9 % (ref 11.5–15.5)
WBC: 5.8 10*3/uL (ref 4.0–10.5)
nRBC: 0 % (ref 0.0–0.2)

## 2021-05-20 LAB — COMPREHENSIVE METABOLIC PANEL
ALT: 11 U/L (ref 0–44)
AST: 21 U/L (ref 15–41)
Albumin: 4.2 g/dL (ref 3.5–5.0)
Alkaline Phosphatase: 41 U/L (ref 38–126)
Anion gap: 8 (ref 5–15)
BUN: 21 mg/dL (ref 8–23)
CO2: 25 mmol/L (ref 22–32)
Calcium: 9.2 mg/dL (ref 8.9–10.3)
Chloride: 99 mmol/L (ref 98–111)
Creatinine, Ser: 1.18 mg/dL — ABNORMAL HIGH (ref 0.44–1.00)
GFR, Estimated: 48 mL/min — ABNORMAL LOW (ref >60)
Glucose, Bld: 86 mg/dL (ref 70–99)
Potassium: 4 mmol/L (ref 3.5–5.1)
Sodium: 132 mmol/L — ABNORMAL LOW (ref 135–145)
Total Bilirubin: 0.2 mg/dL — ABNORMAL LOW (ref 0.3–1.2)
Total Protein: 8.3 g/dL — ABNORMAL HIGH (ref 6.5–8.1)

## 2021-05-20 MED ORDER — TAMOXIFEN CITRATE 20 MG PO TABS: 20.0000 mg | ORAL_TABLET | Freq: Every day | ORAL | 1 refills | Status: DC

## 2021-05-20 NOTE — Progress Notes (Signed)
[place] NOTE  Patient Care Team: Sofie Hartigan, MD as PCP - General (Family Medicine) Bary Castilla, Forest Gleason, MD (General Surgery) Anthonette Legato, MD as Consulting Physician (Internal Medicine) Ree Edman, MD as Consulting Physician (Dermatology) Lequita Asal, MD (Inactive) as Referring Physician (Hematology and Oncology) Anell Barr, OD as Consulting Physician (Optometry) Rockey Situ, Kathlene November, MD as Consulting Physician (Cardiology) Gabriel Carina Betsey Holiday, MD as Physician Assistant (Endocrinology) Fredirick Maudlin, MD (Inactive) as Consulting Physician (General Surgery)  CHIEF COMPLAINTS/PURPOSE OF CONSULTATION: Breast cancer   Oncology History Overview Note  Bianca Shaw is a 76 y.o. female with stage IIA (T2N0) left breast cancer s/p mastectomy with sentinel lymph node biopsy on 12/26/2015.   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  Margins were negative.  Two sentinel lymph nodes were negative for macrometastasis, but with isolated tumor cells on IHC stains.  Three additional lymph nodes were positive for isolated tumor cells on IHC.  Tumor was ER positive (> 90%), PR positive (> 90%), and Her2/neu 2+ (eqivocal).  Her2/neu by FISH was negative.  Pathologic stage was pT2 pN0(i+).   MammaPrint testing revealed low risk luminal type A. There was a 97.8% probability of being disease free at 10 years with hormonal therapy.    Exam in 11/2016 revealed a 4 mm nodule in the left axillae above the mastectomy incision.  Excision biopsy on 12/15/2016 revealed fat necrosis and no malignancy.   Carcinoma of upper-outer quadrant of left breast in female, estrogen receptor positive ([place])  12/06/2015 Initial Diagnosis   Carcinoma of upper-outer quadrant of left breast in female, estrogen receptor positive ([place])      HISTORY OF PRESENTING ILLNESS: Walking independently.  Alone. Bianca Shaw 76  y.o.  female invasive lobular breast cancer T2N1 ER/PR positive HER2 negative on tamoxifen is here for follow-up.  Patient denies any vaginal bleeding.  She has chronic vaginal discharge not any worse.  No blood in stools or black-colored stools.  Patient denies any unusual hot flashes or joint pains.  No unusual bone pain.  Review of Systems  Constitutional:  Negative for chills, diaphoresis, fever, malaise/fatigue and weight loss.  HENT:  Negative for nosebleeds and sore throat.   Eyes:  Negative for double vision.  Respiratory:  Negative for cough, hemoptysis, sputum production, shortness of breath and wheezing.   Cardiovascular:  Negative for chest pain, palpitations, orthopnea and leg swelling.  Gastrointestinal:  Negative for abdominal pain, blood in stool, constipation, diarrhea, heartburn, melena, nausea and vomiting.  Genitourinary:  Negative for dysuria, frequency and urgency.  Musculoskeletal:  Positive for joint pain. Negative for back pain.  Skin: Negative.  Negative for itching and rash.  Neurological:  Negative for dizziness, tingling, focal weakness, weakness and headaches.  Endo/Heme/Allergies:  Does not bruise/bleed easily.  Psychiatric/Behavioral:  Negative for depression. The patient is not nervous/anxious and does not have insomnia.     MEDICAL HISTORY:  Past Medical History:  Diagnosis Date   Anemia    Anxiety disorder    Breast cancer ([place]) 12/03/2015   lt breast   Breast cancer of upper-outer quadrant of left female breast ([place]) 11/2015   pT2 pN0(i+).;ER+; PR +, her 2 neu not overexpressed.  Mastectomy, SLN, Mammoprint: Low risk.    Cancer ([place]) 12/03/2015   left breast/ INVASIVE LOBULAR CARCINOMA.    Chronic kidney disease    STAGE 3   COPD (chronic obstructive pulmonary disease) (HCC)  MILD   Cough    lingering, mild, finished Prednisone and anitbiotic 11/03/15   Dyspnea    DOE   Family history of adverse reaction to anesthesia    sister - PONV    GERD (gastroesophageal reflux disease)    Hypertension    Myocardial infarction ([place])    Osteopenia    Osteoporosis    Pericarditis    diagnonsed June, 2010, unclear etiology as of yer   Personal history of tobacco use, presenting hazards to health 10/31/2015   Scleroderma ([place])    ONLY ON SKIN-MILD   UTI (lower urinary tract infection)    Wears dentures    full upper    SURGICAL HISTORY: Past Surgical History:  Procedure Laterality Date   BREAST BIOPSY Right 2012   core - neg   BREAST BIOPSY Left 12/03/2015   INVASIVE LOBULAR CARCINOMA.    CARDIAC CATHETERIZATION     CATARACT EXTRACTION W/ INTRAOCULAR LENS IMPLANT Right    CATARACT EXTRACTION W/PHACO Left 11/17/2018   Procedure: CATARACT EXTRACTION PHACO AND INTRAOCULAR LENS PLACEMENT;  Surgeon: Marchia Meiers, MD;  Location: [facility];  Service: Ophthalmology;  Laterality: Left;  [place] 00:55 CDE 11.04 Fluid Pack Lot # [id number]   COLONOSCOPY WITH PROPOFOL N/A 11/08/2015   Procedure: COLONOSCOPY WITH PROPOFOL;  Surgeon: Lucilla Lame, MD;  Location: [facility];  Service: Endoscopy;  Laterality: N/A;   CORONARY ANGIOPLASTY     ESOPHAGOGASTRODUODENOSCOPY (EGD) WITH PROPOFOL N/A 11/18/2017   Procedure: ESOPHAGOGASTRODUODENOSCOPY (EGD) WITH PROPOFOL;  Surgeon: Virgel Manifold, MD;  Location: [facility];  Service: Endoscopy;  Laterality: N/A;   ESOPHAGOGASTRODUODENOSCOPY (EGD) WITH PROPOFOL N/A 02/15/2018   Procedure: ESOPHAGOGASTRODUODENOSCOPY (EGD) WITH PROPOFOL;  Surgeon: Jonathon Bellows, MD;  Location: [facility];  Service: Gastroenterology;  Laterality: N/A;   ESOPHAGOGASTRODUODENOSCOPY (EGD) WITH PROPOFOL N/A 05/10/2018   Procedure: ESOPHAGOGASTRODUODENOSCOPY (EGD) WITH BIOPSIES;  Surgeon: Virgel Manifold, MD;  Location: [place];  Service: Endoscopy;  Laterality: N/A;   EVACUATION BREAST HEMATOMA Left 01/14/2016   Procedure: EVACUATION HEMATOMA BREAST;  Surgeon: Robert Bellow, MD;  Location: [facility];   Service: General;  Laterality: Left;   EYE SURGERY     MASTECTOMY Left 2017   complete mastectomy   MASTECTOMY W/ SENTINEL NODE BIOPSY Left 12/26/2015   Procedure: MASTECTOMY WITH SENTINEL LYMPH NODE BIOPSY;  Surgeon: Robert Bellow, MD;  Location: [facility];  Service: General;  Laterality: Left;   RIGHT/LEFT HEART CATH AND CORONARY ANGIOGRAPHY N/A 07/22/2016   Procedure: Right/Left Heart Cath and Coronary Angiography;  Surgeon: Minna Merritts, MD;  Location: [facility];  Service: Cardiovascular;  Laterality: N/A;   TUBAL LIGATION     VESICOVAGINAL FISTULA CLOSURE W/ TAH      SOCIAL HISTORY: Social History   Socioeconomic History   Marital status: Married    Spouse name: Not on file   Number of children: 2   Years of education: Not on file   Highest education level: Not on file  Occupational History   Not on file  Tobacco Use   Smoking status: Former    Packs/day: 0.75    Years: 40.00    Pack years: 30.00    Types: Cigarettes    Quit date: 11/07/2003    Years since quitting: 17.5   Smokeless tobacco: Never  Vaping Use   Vaping Use: Never used  Substance and Sexual Activity   Alcohol use: Yes    Alcohol/week: 1.0 - 3.0 standard drink    Types:  1 - 3 Glasses of wine per week    Comment: Occasionally.     Drug use: No   Sexual activity: Not on file  Other Topics Concern   Not on file  Social History Narrative   She drinks 1 to 2 caffeinated beverages a day   Social Determinants of Health   Financial Resource Strain: Not on file  Food Insecurity: Not on file  Transportation Needs: Not on file  Physical Activity: Not on file  Stress: Not on file  Social Connections: Not on file  Intimate Partner Violence: Not on file    FAMILY HISTORY: Family History  Problem Relation Age of Onset   Heart failure Mother    Epilepsy Mother    COPD Father    Heart disease Father    Anxiety disorder Sister    Arthritis Brother    Heart disease Brother    Vaginal  cancer Paternal Grandmother    Heart attack Paternal Grandfather    COPD Brother    Kidney failure Brother    COPD Brother    Arthritis Sister    Uterine cancer Other    Diabetes Other    Colon cancer Neg Hx    Stomach cancer Neg Hx    Breast cancer Neg Hx     ALLERGIES:  is allergic to pimenta and tomato.  MEDICATIONS:  Current Outpatient Medications  Medication Sig Dispense Refill   albuterol (PROVENTIL) (2.5 MG/3ML) 0.083% nebulizer solution Take 3 mLs (2.5 mg total) by nebulization every 6 (six) hours as needed for wheezing or shortness of breath. 75 mL 12   amLODipine (NORVASC) 2.5 MG tablet Take 1 tablet (2.5 mg total) by mouth daily. 90 tablet 3   Cyanocobalamin (B-12) 5000 MCG CAPS Take 1,000 mcg by mouth daily.      ezetimibe (ZETIA) 10 MG tablet Take 1 tablet (10 mg total) by mouth daily. 90 tablet 2   Ferrous Sulfate (IRON PO) Take 65 mg of iron by mouth. Taking 1 tablet daily     fluticasone (FLONASE) 50 MCG/ACT nasal spray Place 2 sprays into both nostrils daily. 16 g 0   furosemide (LASIX) 20 MG tablet Take 1 tablet (20 mg total) by mouth daily. 90 tablet 3   GARLIC PO Take 1 tablet by mouth daily.     hydrALAZINE (APRESOLINE) 25 MG tablet Take 1 tablet (25 mg total) by mouth 2 (two) times daily. 180 tablet 3   KRILL OIL PO Take 1 capsule by mouth daily.     lisinopril (ZESTRIL) 20 MG tablet Take 2 tablets (40 mg total) by mouth daily. 180 tablet 3   metoprolol succinate (TOPROL-XL) 25 MG 24 hr tablet Take 1 tablet (25 mg total) by mouth daily. 90 tablet 3   nitroGLYCERIN (NITROSTAT) 0.4 MG SL tablet Place 1 tablet (0.4 mg total) under the tongue every 5 (five) minutes as needed for chest pain. 30 tablet 3   ondansetron (ZOFRAN ODT) 4 MG disintegrating tablet Take 1 tablet (4 mg total) by mouth every 8 (eight) hours as needed. 20 tablet 1   potassium chloride SA (KLOR-CON) 20 MEQ tablet Take 1 tablet (20 mEq total) by mouth daily. 90 tablet 3   simvastatin (ZOCOR) 40 MG  tablet Take 1 tablet (40 mg total) by mouth daily. 90 tablet 3   sodium chloride 1 g tablet Take 1 g by mouth daily.     tamoxifen (NOLVADEX) 20 MG tablet Take 1 tablet (20 mg total) by mouth daily.  90 tablet 1   No current facility-administered medications for this visit.      Marland Kitchen  PHYSICAL EXAMINATION: ECOG PERFORMANCE STATUS: 1 - Symptomatic but completely ambulatory  Vitals:   05/20/21 1001  BP: (!) 144/65  Pulse: (!) 54  Resp: 18  Temp: 98.2 F (36.8 C)   Filed Weights   05/20/21 1001  Weight: 148 lb 12.8 oz (67.5 kg)    Physical Exam Vitals and nursing note reviewed.  Constitutional:      Comments:      HENT:     Head: Normocephalic and atraumatic.     Mouth/Throat:     Pharynx: Oropharynx is clear.  Eyes:     Extraocular Movements: Extraocular movements intact.     Pupils: Pupils are equal, round, and reactive to light.  Cardiovascular:     Rate and Rhythm: Normal rate and regular rhythm.  Pulmonary:     Comments: Decreased breath sounds bilaterally.  Abdominal:     Palpations: Abdomen is soft.  Musculoskeletal:        General: Normal range of motion.     Cervical back: Normal range of motion.  Skin:    General: Skin is warm.  Neurological:     General: No focal deficit present.     Mental Status: She is alert and oriented to person, place, and time.  Psychiatric:        Behavior: Behavior normal.        Judgment: Judgment normal.     LABORATORY DATA:  I have reviewed the data as listed Lab Results  Component Value Date   WBC 5.8 05/20/2021   HGB 11.3 (L) 05/20/2021   HCT 33.5 (L) 05/20/2021   MCV 102.1 (H) 05/20/2021   PLT 234 05/20/2021   Recent Labs    05/22/20 1049 11/19/20 0948 05/20/21 0946  NA 133* 130* 132*  K 4.3 4.6 4.0  CL 101 99 99  CO2 _0 GLUCOSE 100* 89 86  BUN _1 CREATININE 1.29* 1.22* 1.18*  CALCIUM 8.8* 9.2 9.2  GFRNONAA 43* 46* 48*  PROT 8.1 8.0 8.3*  ALBUMIN 4.0 3.9 4.2  AST _2 ALT _3 ALKPHOS 42 38 41  BILITOT 0.3 0.4 0.2*    RADIOGRAPHIC STUDIES: I have personally reviewed the radiological images as listed and agreed with the findings in the report. No results found.  ASSESSMENT & PLAN:   Carcinoma of upper-outer quadrant of left breast in female, estrogen receptor positive ([place]) # Stage IIA left breast cancer- Invasive lobular carcinoma s/p mastec;[Uterus-intact] 4.2 cm lesion; T2, N1 (iso).; ER/ PR positive; started tamoxifen October 2017[? Ext Tam]; clinically no evidence of recurrence.  Continue tamoxifen for now. AUG 2022- Right Mammo.   #Anemia secondary CKD hemoglobin ~11.3 [chronic 10-11];continue PO iron/d; Continue close monitoring.check MM panel; K/L light chains.   #Osteopenia August 2021-continue Fosamax calcium plus vitamin D; check BMD   #SIADH/chronic kidney disease-III [Dr.Lateef]. STABLE>   #DISPOSITION: # right mammo/BMD in aug 2023 # follow up in 7 months [after above tests]- MD; labs- cbc/cmp;ca-27-29; MM panel; K/l light chain ratio--Dr.B  All questions were answered. The patient knows to call the clinic with any problems, questions or concerns.    Cammie Sickle, MD 05/20/2021 10:54 AM

## 2021-05-20 NOTE — Assessment & Plan Note (Addendum)
#   Stage IIA left breast cancer- Invasive lobular carcinoma s/p mastec;[Uterus-intact] 4.2 cm lesion; T2, N1 (iso).; ER/ PR positive; started tamoxifen October 2017[? Ext Tam]; clinically no evidence of recurrence.  Continue tamoxifen for now. AUG 2022- Right Mammo.   #Anemia secondary CKD hemoglobin ~11.3 [chronic 10-11];continue PO iron/d; Continue close monitoring.check MM panel; K/L light chains.   #Osteopenia August 2021-continue Fosamax calcium plus vitamin D; check BMD   #SIADH/chronic kidney disease-III [Dr.Lateef]. STABLE>   #DISPOSITION: # right mammo/BMD in aug 2023 # follow up in 7 months [after above tests]- MD; labs- cbc/cmp;ca-27-29; MM panel; K/l light chain ratio--Dr.B

## 2021-05-20 NOTE — Progress Notes (Signed)
Patient denies new problems/concerns today.   °

## 2021-05-21 LAB — CANCER ANTIGEN 27.29: CA 27.29: 16.5 U/mL (ref 0.0–38.6)

## 2021-05-27 DIAGNOSIS — N1832 Chronic kidney disease, stage 3b: Secondary | ICD-10-CM | POA: Diagnosis not present

## 2021-05-27 DIAGNOSIS — J449 Chronic obstructive pulmonary disease, unspecified: Secondary | ICD-10-CM | POA: Diagnosis not present

## 2021-05-27 DIAGNOSIS — N2581 Secondary hyperparathyroidism of renal origin: Secondary | ICD-10-CM | POA: Diagnosis not present

## 2021-05-27 DIAGNOSIS — E871 Hypo-osmolality and hyponatremia: Secondary | ICD-10-CM | POA: Diagnosis not present

## 2021-05-27 DIAGNOSIS — I25119 Atherosclerotic heart disease of native coronary artery with unspecified angina pectoris: Secondary | ICD-10-CM | POA: Diagnosis not present

## 2021-05-27 DIAGNOSIS — E7800 Pure hypercholesterolemia, unspecified: Secondary | ICD-10-CM | POA: Diagnosis not present

## 2021-05-27 DIAGNOSIS — M349 Systemic sclerosis, unspecified: Secondary | ICD-10-CM | POA: Diagnosis not present

## 2021-05-27 DIAGNOSIS — I10 Essential (primary) hypertension: Secondary | ICD-10-CM | POA: Diagnosis not present

## 2021-05-27 DIAGNOSIS — C50412 Malignant neoplasm of upper-outer quadrant of left female breast: Secondary | ICD-10-CM | POA: Diagnosis not present

## 2021-05-27 DIAGNOSIS — R7302 Impaired glucose tolerance (oral): Secondary | ICD-10-CM | POA: Diagnosis not present

## 2021-05-28 DIAGNOSIS — E7800 Pure hypercholesterolemia, unspecified: Secondary | ICD-10-CM | POA: Diagnosis not present

## 2021-05-28 DIAGNOSIS — R7302 Impaired glucose tolerance (oral): Secondary | ICD-10-CM | POA: Diagnosis not present

## 2021-07-01 DIAGNOSIS — R7989 Other specified abnormal findings of blood chemistry: Secondary | ICD-10-CM | POA: Diagnosis not present

## 2021-07-01 DIAGNOSIS — E871 Hypo-osmolality and hyponatremia: Secondary | ICD-10-CM | POA: Diagnosis not present

## 2021-08-19 ENCOUNTER — Ambulatory Visit: Payer: PPO | Admitting: Cardiovascular Disease

## 2021-08-19 ENCOUNTER — Other Ambulatory Visit: Payer: Self-pay | Admitting: Cardiovascular Disease

## 2021-08-19 DIAGNOSIS — I10 Essential (primary) hypertension: Secondary | ICD-10-CM

## 2021-08-19 NOTE — Telephone Encounter (Signed)
Patient cancelled 4-25 appt  ? ?Declines recalls will not fu . Deleting recall.  ?

## 2021-08-19 NOTE — Telephone Encounter (Signed)
Please schedule overdue 12 month F/U appointment for 90 day refills. Thank you! 

## 2021-08-19 NOTE — Telephone Encounter (Signed)
Attempted to schedule.  LMOV to call office.  ° °

## 2021-09-16 DIAGNOSIS — I10 Essential (primary) hypertension: Secondary | ICD-10-CM | POA: Diagnosis not present

## 2021-09-16 DIAGNOSIS — R809 Proteinuria, unspecified: Secondary | ICD-10-CM | POA: Diagnosis not present

## 2021-09-16 DIAGNOSIS — N2581 Secondary hyperparathyroidism of renal origin: Secondary | ICD-10-CM | POA: Diagnosis not present

## 2021-09-16 DIAGNOSIS — N1832 Chronic kidney disease, stage 3b: Secondary | ICD-10-CM | POA: Diagnosis not present

## 2021-09-16 DIAGNOSIS — E871 Hypo-osmolality and hyponatremia: Secondary | ICD-10-CM | POA: Diagnosis not present

## 2021-09-16 DIAGNOSIS — D631 Anemia in chronic kidney disease: Secondary | ICD-10-CM | POA: Diagnosis not present

## 2021-09-17 ENCOUNTER — Other Ambulatory Visit: Payer: Self-pay | Admitting: Cardiovascular Disease

## 2021-12-01 ENCOUNTER — Ambulatory Visit
Admission: RE | Admit: 2021-12-01 | Discharge: 2021-12-01 | Disposition: A | Discharge status: Home / Self Care | Payer: PPO | Source: Ambulatory Visit | Attending: Internal Medicine | Admitting: Internal Medicine

## 2021-12-01 DIAGNOSIS — Z170 Estrogen receptor positive status [ER+]: Secondary | ICD-10-CM | POA: Insufficient documentation

## 2021-12-01 DIAGNOSIS — Z1231 Encounter for screening mammogram for malignant neoplasm of breast: Secondary | ICD-10-CM | POA: Insufficient documentation

## 2021-12-01 DIAGNOSIS — C50412 Malignant neoplasm of upper-outer quadrant of left female breast: Secondary | ICD-10-CM | POA: Insufficient documentation

## 2021-12-02 DIAGNOSIS — I129 Hypertensive chronic kidney disease with stage 1 through stage 4 chronic kidney disease, or unspecified chronic kidney disease: Secondary | ICD-10-CM | POA: Diagnosis not present

## 2021-12-02 DIAGNOSIS — E785 Hyperlipidemia, unspecified: Secondary | ICD-10-CM | POA: Diagnosis not present

## 2021-12-02 DIAGNOSIS — N1832 Chronic kidney disease, stage 3b: Secondary | ICD-10-CM | POA: Diagnosis not present

## 2022-01-04 NOTE — Progress Notes (Deleted)
Cardiology Office Note  Date:  01/04/2022   ID:  Bianca Shaw, DOB Jun 27, 1945, MRN [id number]  PCP:  Sofie Hartigan, MD   No chief complaint on file.   HPI:  Bianca Shaw is a 76 year old woman with history of  smoking,1 ppd,  40 years, COPD breast cancer on the left with mastectomy, no XRT hyponatremia, low potassium hypertension  in hospital  troponin 1.47 in the setting of left-sided chest pain on 07/15/2016.  outpt cardiac cath , no CAD, demand ischemia secondary to N/V, calcified LAD CT scan chest  Hx of low sodium significant coronary calcifications particularly in the LAD, and aorta She presents for follow-up of her chest pain  Last seen in clinic March 2022  Sedentary, legs weak,  Active in garden, in the summer No exercise program in particular  Denies any significant chest pain symptoms, shortness of breath on exertion Blood pressure running high 140 up to 150 at home Little bit of medication confusion, Amlodipine previously on her list, does not appear to be taking this  Labs reviewed: HGb 10.4, on iron, one a day CR 1.3 HGBA1C 5.7  EKG personally reviewed by myself on todays visit NSR rate 51 bpm, no st or t wave changes  Other past medical hx reviewed  echocardiogram and cardiac catheterization results, CT scan   Previous hospitalization March 2018 Stressors included dry heaves, nausea for 3-4 days prior to admission  left side chest pain, SOB significant diarrhea, potassium 2.9, sodium 123   CT scan chest in the hospital significant coronary calcifications particularly in the LAD, and aorta  07/16/16 Left ventricle: ejection fraction was in the range of 60% to 65%. Wall motion was normal Mitral valve: There was mild to moderate regurgitation. Left atrium: The atrium was mildly dilated. Pulmonary arteries: PA peak pressure: 53 mm Hg (S).  Cath 07/22/16 No hemodynamically significant stenoses Normal right heart pressures Diffuse  calcified plaque, predominantly in the LAD Normal LV function ejection fraction greater than 55%    PMH:   has a past medical history of Anemia, Anxiety disorder, Breast cancer ([place]) (12/03/2015), Breast cancer of upper-outer quadrant of left female breast ([place]) (11/2015), Cancer ([place]) (12/03/2015), Chronic kidney disease, COPD (chronic obstructive pulmonary disease) ([place]), Cough, Dyspnea, Family history of adverse reaction to anesthesia, GERD (gastroesophageal reflux disease), Hypertension, Myocardial infarction ([place]), Osteopenia, Osteoporosis, Pericarditis, Personal history of tobacco use, presenting hazards to health (10/31/2015), Scleroderma ([place]), UTI (lower urinary tract infection), and Wears dentures.  PSH:    Past Surgical History:  Procedure Laterality Date   BREAST BIOPSY Right 2012   core - neg   BREAST BIOPSY Left 12/03/2015   INVASIVE LOBULAR CARCINOMA.    CARDIAC CATHETERIZATION     CATARACT EXTRACTION W/ INTRAOCULAR LENS IMPLANT Right    CATARACT EXTRACTION W/PHACO Left 11/17/2018   Procedure: CATARACT EXTRACTION PHACO AND INTRAOCULAR LENS PLACEMENT;  Surgeon: Marchia Meiers, MD;  Location: [facility];  Service: Ophthalmology;  Laterality: Left;  [place] 00:55 CDE 11.04 Fluid Pack Lot # [id number]   COLONOSCOPY WITH PROPOFOL N/A 11/08/2015   Procedure: COLONOSCOPY WITH PROPOFOL;  Surgeon: Lucilla Lame, MD;  Location: [place];  Service: Endoscopy;  Laterality: N/A;   CORONARY ANGIOPLASTY     ESOPHAGOGASTRODUODENOSCOPY (EGD) WITH PROPOFOL N/A 11/18/2017   Procedure: ESOPHAGOGASTRODUODENOSCOPY (EGD) WITH PROPOFOL;  Surgeon: Virgel Manifold, MD;  Location: [facility];  Service: Endoscopy;  Laterality: N/A;   ESOPHAGOGASTRODUODENOSCOPY (EGD) WITH PROPOFOL N/A 02/15/2018   Procedure: ESOPHAGOGASTRODUODENOSCOPY (EGD) WITH PROPOFOL;  Surgeon: Jonathon Bellows, MD;  [facility];  Service: Gastroenterology;  Laterality: N/A;   ESOPHAGOGASTRODUODENOSCOPY (EGD) WITH  PROPOFOL N/A 05/10/2018   Procedure: ESOPHAGOGASTRODUODENOSCOPY (EGD) WITH BIOPSIES;  Surgeon: Virgel Manifold, MD;  Location: [place];  Service: Endoscopy;  Laterality: N/A;   EVACUATION BREAST HEMATOMA Left 01/14/2016   Procedure: EVACUATION HEMATOMA BREAST;  Surgeon: Robert Bellow, MD;  Location: [facility];  Service: General;  Laterality: Left;   EYE SURGERY     MASTECTOMY Left 2017   complete mastectomy   MASTECTOMY W/ SENTINEL NODE BIOPSY Left 12/26/2015   Procedure: MASTECTOMY WITH SENTINEL LYMPH NODE BIOPSY;  Surgeon: Robert Bellow, MD;  Location: [facility];  Service: General;  Laterality: Left;   RIGHT/LEFT HEART CATH AND CORONARY ANGIOGRAPHY N/A 07/22/2016   Procedure: Right/Left Heart Cath and Coronary Angiography;  Surgeon: Minna Merritts, MD;  Location: [place] [facility];  Service: Cardiovascular;  Laterality: N/A;   TUBAL LIGATION     VESICOVAGINAL FISTULA CLOSURE W/ TAH      Current Outpatient Medications  Medication Sig Dispense Refill   albuterol (PROVENTIL) (2.5 MG/3ML) 0.083% nebulizer solution Take 3 mLs (2.5 mg total) by nebulization every 6 (six) hours as needed for wheezing or shortness of breath. 75 mL 12   amLODipine (NORVASC) 2.5 MG tablet Was not on her list but has been restarted today Take 1 tablet (2.5 mg total) by mouth daily. 30 tablet 0   Cyanocobalamin (B-12) 5000 MCG CAPS Take 1,000 mcg by mouth daily.      ezetimibe (ZETIA) 10 MG tablet Take 1 tablet (10 mg total) by mouth daily. 90 tablet 3   Ferrous Sulfate (IRON PO) Take 65 mg of iron by mouth. Taking 1 tablet daily     fluticasone (FLONASE) 50 MCG/ACT nasal spray Place 2 sprays into both nostrils daily. 16 g 0   furosemide (LASIX) 20 MG tablet Take 20 mg by mouth daily.      GARLIC PO Take 1 tablet by mouth daily.     hydrALAZINE (APRESOLINE) 25 MG tablet Take 1 tablet (25 mg total) by mouth 2 (two) times daily. 60 tablet 11   KRILL OIL PO Take 1 capsule by mouth daily.      lisinopril (PRINIVIL,ZESTRIL) 20 MG tablet Take 1 tablet (20 mg total) by mouth daily. 30 tablet 2   metoprolol succinate (TOPROL-XL) 25 MG 24 hr tablet Take 1 tablet (25 mg total) by mouth daily. 90 tablet 3   nitroGLYCERIN (NITROSTAT) 0.4 MG SL tablet Place 1 tablet (0.4 mg total) under the tongue every 5 (five) minutes as needed for chest pain. 30 tablet 0   ondansetron (ZOFRAN ODT) 4 MG disintegrating tablet Take 1 tablet (4 mg total) by mouth every 8 (eight) hours as needed. 20 tablet 1   potassium chloride SA (K-DUR,KLOR-CON) 20 MEQ tablet Take 1 tablet (20 mEq total) by mouth daily. 90 tablet 2   sodium chloride 1 g tablet Take 1 g by mouth daily.     tamoxifen (NOLVADEX) 20 MG tablet Take 1 tablet (20 mg total) by mouth daily. 90 tablet 0   tamoxifen (NOLVADEX) 20 MG tablet Take 1 tablet (20 mg total) by mouth daily. 90 tablet 3   simvastatin (ZOCOR) 40 MG tablet Take 1 tablet (40 mg total) by mouth daily. 90 tablet 3   No current facility-administered medications for this visit.     Allergies:   Pimenta and Tomato   Social History:  The patient  reports that she quit smoking about 18 years ago. Her smoking use included cigarettes. She has a 30.00 pack-year smoking history. She has never used smokeless tobacco. She reports current alcohol use of about 1.0 - 3.0 standard drink of alcohol per week. She reports that she does not use drugs.   Family History:   family history includes Anxiety disorder in her sister; Arthritis in her brother and sister; COPD in her brother, brother, and father; Diabetes in an other family member; Epilepsy in her mother; Heart attack in her paternal grandfather; Heart disease in her brother and father; Heart failure in her mother; Kidney failure in her brother; Uterine cancer in an other family member; Vaginal cancer in her paternal grandmother.    Review of Systems: Review of Systems  Constitutional: Negative.   Respiratory: Negative.    Cardiovascular:  Negative.   Gastrointestinal: Negative.   Musculoskeletal: Negative.   Neurological: Negative.   Psychiatric/Behavioral: Negative.    All other systems reviewed and are negative.   PHYSICAL EXAM: VS:  There were no vitals taken for this visit. , BMI There is no height or weight on file to calculate BMI. Constitutional:  oriented to person, place, and time. No distress.  HENT:  Head: Grossly normal Eyes:  no discharge. No scleral icterus.  Neck: No JVD, no carotid bruits  Cardiovascular: Regular rate and rhythm, no murmurs appreciated Pulmonary/Chest: Clear to auscultation bilaterally, no wheezes or rails Abdominal: Soft.  no distension.  no tenderness.  Musculoskeletal: Normal range of motion Neurological:  normal muscle tone. Coordination normal. No atrophy Skin: Skin warm and dry Psychiatric: normal affect, pleasant   Recent Labs: 05/20/2021: ALT 11; BUN 21; Creatinine, Ser 1.18; Hemoglobin 11.3; Platelets 234; Potassium 4.0; Sodium 132    Lipid Panel Lab Results  Component Value Date   CHOL 147 06/05/2019   HDL 53 06/05/2019   LDLCALC 75 06/05/2019   TRIG 101 06/05/2019      Wt Readings from Last 3 Encounters:  05/20/21 148 lb 12.8 oz (67.5 kg)  11/19/20 150 lb 5.7 oz (68.2 kg)  07/05/20 149 lb 8 oz (67.8 kg)    ASSESSMENT AND PLAN:  Essential (primary) hypertension BP elevated today, As on several past clinic visits, running 034 up to 917 systolic at home per her records Recommended she increase lisinopril up to 40 daily, Restart amlodipine 2.5 daily Recommend she call with blood pressure numbers  Non-ST elevation (NSTEMI) myocardial infarction [facility])  cardiac catheterization, no obstructive lesions, significant coronary calcification in the LAD particularly Recommend she stay on her simvastatin, cholesterol close to goal  Hypercholesteremia heavy calcification LAD Continue statin  Anxiety Recommend walking/exercise program  Hyponatremia Several  hospitalizations February and March 2019 Avoiding free water, on sodium tabs, followed by nephrology  Smoker Reports that she is not a smoker currently Cessation recommended  Shortness of breath Moderately elevated right heart pressures on echocardiogram on in the hospital 2018 Continue Lasix potassium   Total encounter time more than 25 minutes  Greater than 50% was spent in counseling and coordination of care with the patient    No orders of the defined types were placed in this encounter.    Signed, Esmond Plants, M.D., Ph.D. 01/04/2022  [place], [place]

## 2022-01-05 ENCOUNTER — Ambulatory Visit: Payer: PPO | Admitting: Cardiovascular Disease

## 2022-01-05 ENCOUNTER — Telehealth: Payer: Self-pay | Admitting: Cardiovascular Disease

## 2022-01-05 MED ORDER — EZETIMIBE 10 MG PO TABS: 10.0000 mg | ORAL_TABLET | Freq: Every day | ORAL | 0 refills | Status: DC

## 2022-01-05 NOTE — Telephone Encounter (Signed)
*  STAT* If patient is at the pharmacy, call can be transferred to refill team.   1. Which medications need to be refilled? (please list name of each medication and dose if known) Ezetimibe '10mg'$   2. Which pharmacy/location (including street and city if local pharmacy) is medication to be sent to? [facility], [place]  3. Do they need a 30 day or 90 day supply? 90day

## 2022-01-05 NOTE — Telephone Encounter (Signed)
Requested Prescriptions   Signed Prescriptions Disp Refills   ezetimibe (ZETIA) 10 MG tablet 30 tablet 0    Sig: Take 1 tablet (10 mg total) by mouth daily.    Authorizing Provider: Minna Merritts    Ordering User: Raelene Bott, Keegen Heffern L   Unable to give 90 day refills until seen in office. Last seen 06/2020.

## 2022-01-06 DIAGNOSIS — I10 Essential (primary) hypertension: Secondary | ICD-10-CM | POA: Diagnosis not present

## 2022-01-06 DIAGNOSIS — C50412 Malignant neoplasm of upper-outer quadrant of left female breast: Secondary | ICD-10-CM | POA: Diagnosis not present

## 2022-01-06 DIAGNOSIS — I25119 Atherosclerotic heart disease of native coronary artery with unspecified angina pectoris: Secondary | ICD-10-CM | POA: Diagnosis not present

## 2022-01-06 DIAGNOSIS — R7302 Impaired glucose tolerance (oral): Secondary | ICD-10-CM | POA: Diagnosis not present

## 2022-01-06 DIAGNOSIS — N1832 Chronic kidney disease, stage 3b: Secondary | ICD-10-CM | POA: Diagnosis not present

## 2022-01-06 DIAGNOSIS — Z0000 Encounter for general adult medical examination without abnormal findings: Secondary | ICD-10-CM | POA: Diagnosis not present

## 2022-01-06 DIAGNOSIS — M349 Systemic sclerosis, unspecified: Secondary | ICD-10-CM | POA: Diagnosis not present

## 2022-01-06 DIAGNOSIS — E871 Hypo-osmolality and hyponatremia: Secondary | ICD-10-CM | POA: Diagnosis not present

## 2022-01-06 DIAGNOSIS — E7800 Pure hypercholesterolemia, unspecified: Secondary | ICD-10-CM | POA: Diagnosis not present

## 2022-01-06 DIAGNOSIS — N2581 Secondary hyperparathyroidism of renal origin: Secondary | ICD-10-CM | POA: Diagnosis not present

## 2022-01-06 DIAGNOSIS — J449 Chronic obstructive pulmonary disease, unspecified: Secondary | ICD-10-CM | POA: Diagnosis not present

## 2022-01-08 ENCOUNTER — Inpatient Hospital Stay: Payer: PPO | Attending: Internal Medicine

## 2022-01-08 ENCOUNTER — Encounter: Payer: Self-pay | Admitting: Internal Medicine

## 2022-01-08 ENCOUNTER — Inpatient Hospital Stay (HOSPITAL_BASED_OUTPATIENT_CLINIC_OR_DEPARTMENT_OTHER): Payer: PPO | Admitting: Internal Medicine

## 2022-01-08 DIAGNOSIS — Z170 Estrogen receptor positive status [ER+]: Secondary | ICD-10-CM | POA: Insufficient documentation

## 2022-01-08 DIAGNOSIS — M8580 Other specified disorders of bone density and structure, unspecified site: Secondary | ICD-10-CM | POA: Diagnosis not present

## 2022-01-08 DIAGNOSIS — Z87891 Personal history of nicotine dependence: Secondary | ICD-10-CM | POA: Diagnosis not present

## 2022-01-08 DIAGNOSIS — Z79810 Long term (current) use of selective estrogen receptor modulators (SERMs): Secondary | ICD-10-CM | POA: Diagnosis not present

## 2022-01-08 DIAGNOSIS — D631 Anemia in chronic kidney disease: Secondary | ICD-10-CM | POA: Diagnosis not present

## 2022-01-08 DIAGNOSIS — E222 Syndrome of inappropriate secretion of antidiuretic hormone: Secondary | ICD-10-CM | POA: Diagnosis not present

## 2022-01-08 DIAGNOSIS — N183 Chronic kidney disease, stage 3 unspecified: Secondary | ICD-10-CM | POA: Diagnosis not present

## 2022-01-08 DIAGNOSIS — C50412 Malignant neoplasm of upper-outer quadrant of left female breast: Secondary | ICD-10-CM | POA: Insufficient documentation

## 2022-01-08 LAB — COMPREHENSIVE METABOLIC PANEL
ALT: 13 U/L (ref 0–44)
AST: 22 U/L (ref 15–41)
Albumin: 4 g/dL (ref 3.5–5.0)
Alkaline Phosphatase: 41 U/L (ref 38–126)
Anion gap: 7 (ref 5–15)
BUN: 20 mg/dL (ref 8–23)
CO2: 24 mmol/L (ref 22–32)
Calcium: 9.1 mg/dL (ref 8.9–10.3)
Chloride: 100 mmol/L (ref 98–111)
Creatinine, Ser: 1.31 mg/dL — ABNORMAL HIGH (ref 0.44–1.00)
GFR, Estimated: 42 mL/min — ABNORMAL LOW (ref >60)
Glucose, Bld: 100 mg/dL — ABNORMAL HIGH (ref 70–99)
Potassium: 4.2 mmol/L (ref 3.5–5.1)
Sodium: 131 mmol/L — ABNORMAL LOW (ref 135–145)
Total Bilirubin: 0.4 mg/dL (ref 0.3–1.2)
Total Protein: 8.1 g/dL (ref 6.5–8.1)

## 2022-01-08 LAB — CBC WITH DIFFERENTIAL/PLATELET
Abs Immature Granulocytes: 0.01 10*3/uL (ref 0.00–0.07)
Basophils Absolute: 0.1 10*3/uL (ref 0.0–0.1)
Basophils Relative: 1 %
Eosinophils Absolute: 0.2 10*3/uL (ref 0.0–0.5)
Eosinophils Relative: 4 %
HCT: 32.8 % — ABNORMAL LOW (ref 36.0–46.0)
Hemoglobin: 10.9 g/dL — ABNORMAL LOW (ref 12.0–15.0)
Immature Granulocytes: 0 %
Lymphocytes Relative: 22 %
Lymphs Abs: 1.2 10*3/uL (ref 0.7–4.0)
MCH: 34 pg (ref 26.0–34.0)
MCHC: 33.2 g/dL (ref 30.0–36.0)
MCV: 102.2 fL — ABNORMAL HIGH (ref 80.0–100.0)
Monocytes Absolute: 0.7 10*3/uL (ref 0.1–1.0)
Monocytes Relative: 14 %
Neutro Abs: 3.1 10*3/uL (ref 1.7–7.7)
Neutrophils Relative %: 59 %
Platelets: 228 10*3/uL (ref 150–400)
RBC: 3.21 MIL/uL — ABNORMAL LOW (ref 3.87–5.11)
RDW: 12.5 % (ref 11.5–15.5)
WBC: 5.2 10*3/uL (ref 4.0–10.5)
nRBC: 0 % (ref 0.0–0.2)

## 2022-01-08 MED ORDER — TAMOXIFEN CITRATE 20 MG PO TABS: 20.0000 mg | ORAL_TABLET | Freq: Every day | ORAL | 3 refills | Status: DC

## 2022-01-08 NOTE — Progress Notes (Unsigned)
[place] CONSULT NOTE  Patient Care Team: Sofie Hartigan, MD as PCP - General (Family Medicine) Bary Castilla, Forest Gleason, MD (General Surgery) Anthonette Legato, MD as Consulting Physician (Internal Medicine) Ree Edman, MD as Consulting Physician (Dermatology) Lequita Asal, MD (Inactive) as Referring Physician (Hematology and Oncology) Anell Barr, OD as Consulting Physician (Optometry) Rockey Situ, Kathlene November, MD as Consulting Physician (Cardiology) Gabriel Carina Betsey Holiday, MD as Physician Assistant (Endocrinology) Fredirick Maudlin, MD as Consulting Physician (General Surgery)  CHIEF COMPLAINTS/PURPOSE OF CONSULTATION: Breast cancer   Oncology History Overview Note  MAY MANRIQUE is a 76 y.o. female with stage IIA (T2N0) left breast cancer s/p mastectomy with sentinel lymph node biopsy on 12/26/2015.   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  Margins were negative.  Two sentinel lymph nodes were negative for macrometastasis, but with isolated tumor cells on IHC stains.  Three additional lymph nodes were positive for isolated tumor cells on IHC.  Tumor was ER positive (> 90%), PR positive (> 90%), and Her2/neu 2+ (eqivocal).  Her2/neu by FISH was negative.  Pathologic stage was pT2 pN0(i+).   MammaPrint testing revealed low risk luminal type A. There was a 97.8% probability of being disease free at 10 years with hormonal therapy.    Exam in 11/2016 revealed a 4 mm nodule in the left axillae above the mastectomy incision.  Excision biopsy on 12/15/2016 revealed fat necrosis and no malignancy.   Carcinoma of upper-outer quadrant of left breast in female, estrogen receptor positive ([place])  12/06/2015 Initial Diagnosis   Carcinoma of upper-outer quadrant of left breast in female, estrogen receptor positive ([place])      HISTORY OF PRESENTING ILLNESS: Walking independently.  Alone. [name] 76 y.o.  female  invasive lobular breast cancer T2N1 ER/PR positive HER2 negative on tamoxifen is here for follow-up.  Patient denies any vaginal bleeding.  She has chronic vaginal discharge not any worse.  No blood in stools or black-colored stools.  Patient denies any unusual hot flashes or joint pains.  No unusual bone pain.  Review of Systems  Constitutional:  Negative for chills, diaphoresis, fever, malaise/fatigue and weight loss.  HENT:  Negative for nosebleeds and sore throat.   Eyes:  Negative for double vision.  Respiratory:  Negative for cough, hemoptysis, sputum production, shortness of breath and wheezing.   Cardiovascular:  Negative for chest pain, palpitations, orthopnea and leg swelling.  Gastrointestinal:  Negative for abdominal pain, blood in stool, constipation, diarrhea, heartburn, melena, nausea and vomiting.  Genitourinary:  Negative for dysuria, frequency and urgency.  Musculoskeletal:  Positive for joint pain. Negative for back pain.  Skin: Negative.  Negative for itching and rash.  Neurological:  Negative for dizziness, tingling, focal weakness, weakness and headaches.  Endo/Heme/Allergies:  Does not bruise/bleed easily.  Psychiatric/Behavioral:  Negative for depression. The patient is not nervous/anxious and does not have insomnia.      MEDICAL HISTORY:  Past Medical History:  Diagnosis Date   Anemia    Anxiety disorder    Breast cancer ([place]) 12/03/2015   lt breast   Breast cancer of upper-outer quadrant of left female breast ([place]) 11/2015   pT2 pN0(i+).;ER+; PR +, her 2 neu not overexpressed.  Mastectomy, SLN, Mammoprint: Low risk.    Cancer ([place]) 12/03/2015   left breast/ INVASIVE LOBULAR CARCINOMA.    Chronic kidney disease    STAGE 3   COPD (chronic obstructive pulmonary disease) ([facility])  MILD   Cough    lingering, mild, finished Prednisone and anitbiotic 11/03/15   Dyspnea    DOE   Family history of adverse reaction to anesthesia    sister - PONV   GERD  (gastroesophageal reflux disease)    Hypertension    Myocardial infarction ([place])    Osteopenia    Osteoporosis    Pericarditis    diagnonsed June, 2010, unclear etiology as of yer   Personal history of tobacco use, presenting hazards to health 10/31/2015   Scleroderma ([place])    ONLY ON SKIN-MILD   UTI (lower urinary tract infection)    Wears dentures    full upper    SURGICAL HISTORY: Past Surgical History:  Procedure Laterality Date   BREAST BIOPSY Right 2012   core - neg   BREAST BIOPSY Left 12/03/2015   INVASIVE LOBULAR CARCINOMA.    CARDIAC CATHETERIZATION     CATARACT EXTRACTION W/ INTRAOCULAR LENS IMPLANT Right    CATARACT EXTRACTION W/PHACO Left 11/17/2018   Procedure: CATARACT EXTRACTION PHACO AND INTRAOCULAR LENS PLACEMENT;  Surgeon: Marchia Meiers, MD;  Location: [facility];  Service: Ophthalmology;  Laterality: Left;  [place] 00:55 CDE 11.04 Fluid Pack Lot # [id number]   COLONOSCOPY WITH PROPOFOL N/A 11/08/2015   Procedure: COLONOSCOPY WITH PROPOFOL;  Surgeon: Lucilla Lame, MD;  Location: [place];  Service: Endoscopy;  Laterality: N/A;   CORONARY ANGIOPLASTY     ESOPHAGOGASTRODUODENOSCOPY (EGD) WITH PROPOFOL N/A 11/18/2017   Procedure: ESOPHAGOGASTRODUODENOSCOPY (EGD) WITH PROPOFOL;  Surgeon: Virgel Manifold, MD;  Location: [facility];  Service: Endoscopy;  Laterality: N/A;   ESOPHAGOGASTRODUODENOSCOPY (EGD) WITH PROPOFOL N/A 02/15/2018   Procedure: ESOPHAGOGASTRODUODENOSCOPY (EGD) WITH PROPOFOL;  Surgeon: Jonathon Bellows, MD;  [facility];  Service: Gastroenterology;  Laterality: N/A;   ESOPHAGOGASTRODUODENOSCOPY (EGD) WITH PROPOFOL N/A 05/10/2018   Procedure: ESOPHAGOGASTRODUODENOSCOPY (EGD) WITH BIOPSIES;  Surgeon: Virgel Manifold, MD;  Location: [place];  Service: Endoscopy;  Laterality: N/A;   EVACUATION BREAST HEMATOMA Left 01/14/2016   Procedure: EVACUATION HEMATOMA BREAST;  Surgeon: Robert Bellow, MD;  Location: [facility];   Service: General;  Laterality: Left;   EYE SURGERY     MASTECTOMY Left 2017   complete mastectomy   MASTECTOMY W/ SENTINEL NODE BIOPSY Left 12/26/2015   Procedure: MASTECTOMY WITH SENTINEL LYMPH NODE BIOPSY;  Surgeon: Robert Bellow, MD;  Location: [facility];  Service: General;  Laterality: Left;   RIGHT/LEFT HEART CATH AND CORONARY ANGIOGRAPHY N/A 07/22/2016   Procedure: Right/Left Heart Cath and Coronary Angiography;  Surgeon: Minna Merritts, MD;  Location: [place] [facility];  Service: Cardiovascular;  Laterality: N/A;   TUBAL LIGATION     VESICOVAGINAL FISTULA CLOSURE W/ TAH      SOCIAL HISTORY: Social History   Socioeconomic History   Marital status: Married    Spouse name: Not on file   Number of children: 2   Years of education: Not on file   Highest education level: Not on file  Occupational History   Not on file  Tobacco Use   Smoking status: Former    Packs/day: 0.75    Years: 40.00    Total pack years: 30.00    Types: Cigarettes    Quit date: 11/07/2003    Years since quitting: 18.1   Smokeless tobacco: Never  Vaping Use   Vaping Use: Never used  Substance and Sexual Activity   Alcohol use: Yes    Alcohol/week: 1.0 - 3.0 standard drink of alcohol  Types: 1 - 3 Glasses of wine per week    Comment: Occasionally.     Drug use: No   Sexual activity: Not on file  Other Topics Concern   Not on file  Social History Narrative   She drinks 1 to 2 caffeinated beverages a day   Social Determinants of Health   Financial Resource Strain: Not on file  Food Insecurity: Not on file  Transportation Needs: Not on file  Physical Activity: Not on file  Stress: Not on file  Social Connections: Not on file  Intimate Partner Violence: Not on file    FAMILY HISTORY: Family History  Problem Relation Age of Onset   Heart failure Mother    Epilepsy Mother    COPD Father    Heart disease Father    Anxiety disorder Sister    Arthritis Brother    Heart disease  Brother    Vaginal cancer Paternal Grandmother    Heart attack Paternal Grandfather    COPD Brother    Kidney failure Brother    COPD Brother    Arthritis Sister    Uterine cancer Other    Diabetes Other    Colon cancer Neg Hx    Stomach cancer Neg Hx    Breast cancer Neg Hx     ALLERGIES:  is allergic to pimenta and tomato.  MEDICATIONS:  Current Outpatient Medications  Medication Sig Dispense Refill   albuterol (PROVENTIL) (2.5 MG/3ML) 0.083% nebulizer solution Take 3 mLs (2.5 mg total) by nebulization every 6 (six) hours as needed for wheezing or shortness of breath. 75 mL 12   amLODipine (NORVASC) 2.5 MG tablet Take 1 tablet (2.5 mg total) by mouth daily. 90 tablet 3   Cyanocobalamin (B-12) 5000 MCG CAPS Take 1,000 mcg by mouth daily.      ezetimibe (ZETIA) 10 MG tablet Take 1 tablet (10 mg total) by mouth daily. 30 tablet 0   Ferrous Sulfate (IRON PO) Take 65 mg of iron by mouth. Taking 1 tablet daily     fluticasone (FLONASE) 50 MCG/ACT nasal spray Place 2 sprays into both nostrils daily. 16 g 0   furosemide (LASIX) 20 MG tablet Take 1 tablet (20 mg total) by mouth daily. 90 tablet 3   GARLIC PO Take 1 tablet by mouth daily.     hydrALAZINE (APRESOLINE) 25 MG tablet Take 1 tablet (25 mg total) by mouth 2 (two) times daily. 180 tablet 3   KRILL OIL PO Take 1 capsule by mouth daily.     lisinopril (ZESTRIL) 20 MG tablet Take 2 tablets (40 mg total) by mouth daily. 180 tablet 3   metoprolol succinate (TOPROL-XL) 25 MG 24 hr tablet Take 1 tablet (25 mg total) by mouth daily. 90 tablet 3   nitroGLYCERIN (NITROSTAT) 0.4 MG SL tablet Place 1 tablet (0.4 mg total) under the tongue every 5 (five) minutes as needed for chest pain. 30 tablet 3   ondansetron (ZOFRAN ODT) 4 MG disintegrating tablet Take 1 tablet (4 mg total) by mouth every 8 (eight) hours as needed. 20 tablet 1   pantoprazole (PROTONIX) 40 MG tablet Take by mouth.     potassium chloride SA (KLOR-CON) 20 MEQ tablet Take 1  tablet (20 mEq total) by mouth daily. 90 tablet 3   simvastatin (ZOCOR) 40 MG tablet Take 1 tablet (40 mg total) by mouth daily. 90 tablet 3   tamoxifen (NOLVADEX) 20 MG tablet Take 1 tablet (20 mg total) by mouth daily. 90 tablet  1   sodium chloride 1 g tablet Take 1 g by mouth daily. (Patient not taking: Reported on 01/08/2022)     No current facility-administered medications for this visit.      Marland Kitchen  PHYSICAL EXAMINATION: ECOG PERFORMANCE STATUS: 1 - Symptomatic but completely ambulatory  Vitals:   01/08/22 1100  BP: (!) 159/68  Pulse: (!) 51  Resp: 16  Temp: (!) 96.8 F (36 C)   Filed Weights   01/08/22 1100  Weight: 147 lb 9.6 oz (67 kg)    Physical Exam Vitals and nursing note reviewed.  Constitutional:      Comments:      HENT:     Head: Normocephalic and atraumatic.     Mouth/Throat:     Pharynx: Oropharynx is clear.  Eyes:     Extraocular Movements: Extraocular movements intact.     Pupils: Pupils are equal, round, and reactive to light.  Cardiovascular:     Rate and Rhythm: Normal rate and regular rhythm.  Pulmonary:     Comments: Decreased breath sounds bilaterally.  Abdominal:     Palpations: Abdomen is soft.  Musculoskeletal:        General: Normal range of motion.     Cervical back: Normal range of motion.  Skin:    General: Skin is warm.  Neurological:     General: No focal deficit present.     Mental Status: She is alert and oriented to person, place, and time.  Psychiatric:        Behavior: Behavior normal.        Judgment: Judgment normal.      LABORATORY DATA:  I have reviewed the data as listed Lab Results  Component Value Date   WBC 5.2 01/08/2022   HGB 10.9 (L) 01/08/2022   HCT 32.8 (L) 01/08/2022   MCV 102.2 (H) 01/08/2022   PLT 228 01/08/2022   Recent Labs    05/20/21 0946 01/08/22 1111  NA 132* 131*  K 4.0 4.2  CL 99 100  CO2 25 24  GLUCOSE 86 100*  BUN 21 20  CREATININE 1.18* 1.31*  CALCIUM 9.2 9.1  GFRNONAA  48* 42*  PROT 8.3* 8.1  ALBUMIN 4.2 4.0  AST 21 22  ALT 11 13  ALKPHOS 41 41  BILITOT 0.2* 0.4    RADIOGRAPHIC STUDIES: I have personally reviewed the radiological images as listed and agreed with the findings in the report. No results found.  ASSESSMENT & PLAN:   Carcinoma of upper-outer quadrant of left breast in female, estrogen receptor positive ([place]) # Stage IIA left breast cancer- Invasive lobular carcinoma s/p mastec;[Uterus-intact] 4.2 cm lesion; T2, N1 (iso).; ER/ PR positive; started tamoxifen October 2017[? Ext Tam]; clinically no evidence of recurrence.  Continue tamoxifen for now. AUG 2023- Right Mammo- WNL. Marland Kitchen   #Anemia secondary CKD hemoglobin ~11.3 [chronic 10-11];continue PO iron/d; Continue close monitoring.check MM panel; K/L light chains.   #Osteopenia August 2021-continue Fosamax calcium plus vitamin D; check BMD in Aug 2024  #SIADH/chronic kidney disease-III [Dr.Lateef]. STABLE>   #DISPOSITION: # right mammo/BMD in aug 2023 # follow up in 7 months [after above tests]- MD; labs- cbc/cmp;ca-27-29; MM panel; K/l light chain ratio--Dr.B  All questions were answered. The patient knows to call the clinic with any problems, questions or concerns.    Cammie Sickle, MD 01/08/2022 11:58 AM

## 2022-01-08 NOTE — Assessment & Plan Note (Addendum)
#   Stage IIA left breast cancer- Invasive lobular carcinoma s/p mastec;[Uterus-intact] 4.2 cm lesion; T2, N1 (iso).; ER/ PR positive; started tamoxifen October 2017[? Ext Tam]; clinically no evidence of recurrence.  Continue tamoxifen for now. AUG 2023- Right Mammo- WNL. Marland Kitchen   #Anemia secondary CKD hemoglobin ~11.3 [chronic 10-11];continue PO iron/d; Continue close monitoring.check MM panel; K/L light chains.   #Osteopenia August 2021-continue Fosamax calcium plus vitamin D; check BMD in Aug 2024  #SIADH/chronic kidney disease-III [Dr.Lateef]. STABLE>   #DISPOSITION: # right mammo/BMD in aug 2024 # follow up in 12 months - MD; labs- cbc/cmp;ca-27-29; iron studies; feritin;; U07; folic aicd- LDH--Dr.B

## 2022-01-08 NOTE — Progress Notes (Unsigned)
Patient denies new problems/concerns today.    Rx for Tamoxifen pended for MD review.

## 2022-01-09 ENCOUNTER — Encounter: Payer: Self-pay | Admitting: Hematology and Oncology

## 2022-01-09 LAB — KAPPA/LAMBDA LIGHT CHAINS
Kappa free light chain: 91.5 mg/L — ABNORMAL HIGH (ref 3.3–19.4)
Kappa, lambda light chain ratio: 3.56 — ABNORMAL HIGH (ref 0.26–1.65)
Lambda free light chains: 25.7 mg/L (ref 5.7–26.3)

## 2022-01-09 LAB — CANCER ANTIGEN 27.29: CA 27.29: 12 U/mL (ref 0.0–38.6)

## 2022-01-12 DIAGNOSIS — N1832 Chronic kidney disease, stage 3b: Secondary | ICD-10-CM | POA: Diagnosis not present

## 2022-01-12 NOTE — Progress Notes (Signed)
Cardiology Clinic Note   Patient Name: Bianca Shaw Date of Encounter: 01/16/2022  Primary Care Provider:  Sofie Hartigan, MD Primary Cardiologist:  Ida Rogue, MD  Patient Profile    76 year old female with a past medical history of pericarditis, coronary artery calcification, hypertension, hyponatremia, gastroesophageal reflux disease, COPD, breast cancer, anxiety, anemia, history of smoking 1 pack/day for 40 years, who is here today for annual follow-up of her hypertension and coronary artery calcification.  Past Medical History    Past Medical History:  Diagnosis Date   Anemia    Anxiety disorder    Breast cancer ([place]) 12/03/2015   lt breast   Breast cancer of upper-outer quadrant of left female breast ([place]) 11/2015   pT2 pN0(i+).;ER+; PR +, her 2 neu not overexpressed.  Mastectomy, SLN, Mammoprint: Low risk.    Cancer ([place]) 12/03/2015   left breast/ INVASIVE LOBULAR CARCINOMA.    Chronic kidney disease    STAGE 3   COPD (chronic obstructive pulmonary disease) (HCC)    MILD   Cough    lingering, mild, finished Prednisone and anitbiotic 11/03/15   Dyspnea    DOE   Family history of adverse reaction to anesthesia    sister - PONV   GERD (gastroesophageal reflux disease)    Hypertension    Myocardial infarction ([place])    Osteopenia    Osteoporosis    Pericarditis    diagnonsed June, 2010, unclear etiology as of yer   Personal history of tobacco use, presenting hazards to health 10/31/2015   Scleroderma ([place])    ONLY ON SKIN-MILD   UTI (lower urinary tract infection)    Wears dentures    full upper   Past Surgical History:  Procedure Laterality Date   BREAST BIOPSY Right 2012   core - neg   BREAST BIOPSY Left 12/03/2015   INVASIVE LOBULAR CARCINOMA.    CARDIAC CATHETERIZATION     CATARACT EXTRACTION W/ INTRAOCULAR LENS IMPLANT Right    CATARACT EXTRACTION W/PHACO Left 11/17/2018   Procedure: CATARACT EXTRACTION PHACO AND INTRAOCULAR LENS  PLACEMENT;  Surgeon: Marchia Meiers, MD;  Location: [facility];  Service: Ophthalmology;  Laterality: Left;  [place] 00:55 CDE 11.04 Fluid Pack Lot # [id number]   COLONOSCOPY WITH PROPOFOL N/A 11/08/2015   Procedure: COLONOSCOPY WITH PROPOFOL;  Surgeon: Lucilla Lame, MD;  Location: [facility];  Service: Endoscopy;  Laterality: N/A;   CORONARY ANGIOPLASTY     ESOPHAGOGASTRODUODENOSCOPY (EGD) WITH PROPOFOL N/A 11/18/2017   Procedure: ESOPHAGOGASTRODUODENOSCOPY (EGD) WITH PROPOFOL;  Surgeon: Virgel Manifold, MD;  Location: [facility];  Service: Endoscopy;  Laterality: N/A;   ESOPHAGOGASTRODUODENOSCOPY (EGD) WITH PROPOFOL N/A 02/15/2018   Procedure: ESOPHAGOGASTRODUODENOSCOPY (EGD) WITH PROPOFOL;  Surgeon: Jonathon Bellows, MD;  Location: [facility];  Service: Gastroenterology;  Laterality: N/A;   ESOPHAGOGASTRODUODENOSCOPY (EGD) WITH PROPOFOL N/A 05/10/2018   Procedure: ESOPHAGOGASTRODUODENOSCOPY (EGD) WITH BIOPSIES;  Surgeon: Virgel Manifold, MD;  Location: [facility];  Service: Endoscopy;  Laterality: N/A;   EVACUATION BREAST HEMATOMA Left 01/14/2016   Procedure: EVACUATION HEMATOMA BREAST;  Surgeon: Robert Bellow, MD;  Location: [facility];  Service: General;  Laterality: Left;   EYE SURGERY     MASTECTOMY Left 2017   complete mastectomy   MASTECTOMY W/ SENTINEL NODE BIOPSY Left 12/26/2015   Procedure: MASTECTOMY WITH SENTINEL LYMPH NODE BIOPSY;  Surgeon: Robert Bellow, MD;  Location: [facility];  Service: General;  Laterality: Left;   RIGHT/LEFT HEART CATH AND CORONARY ANGIOGRAPHY N/A 07/22/2016  Procedure: Right/Left Heart Cath and Coronary Angiography;  Surgeon: Minna Merritts, MD;  Location: [facility];  Service: Cardiovascular;  Laterality: N/A;   TUBAL LIGATION     VESICOVAGINAL FISTULA CLOSURE W/ TAH      Allergies  Allergies  Allergen Reactions   Pimenta Nausea And Vomiting   Tomato Rash    History of Present Illness    76 year old female with  a past medical history of pericarditis, coronary artery calcification, hypertension, hyponatremia, gastroesophageal reflux disease, COPD, breast cancer, anxiety, anemia, history of smoking 1 pack/day for 40 years who quit in 2005,  She was originally seen by Dr. Rockey Situ in 2018 where previously been hospitalized and had elevated troponin in the setting of left-sided chest pain.  During her hospitalization she remained chest pain free for over 24 hours, but was still recovering from a GI bug.  It was decided that she would return for outpatient heart catheterization.  Cath was done 07/22/2016 which revealed no hemodynamically significant stenosis, normal right heart pressures, diffuse calcified plaque, predominantly in the LAD, normal LV function with ejection fraction greater than 55%.  Echocardiogram revealed LVEF 60-65%, normal wall motion, mild to moderate mitral regurgitation, the left atrium was mildly dilated.   Last seen in clinic 07/05/2020 without any complaints of chest discomfort or shortness of breath but had had elevated blood pressures on several past clinic visits.  She had medication changes that were made during her visit.  She returns to clinic today stating that she has been doing fairly well.  She continues to walk 1 mile a day.  She has also continued at the cancer center and was just advised that versus of 57-monthappointment she will be having yearly appointments.  She stated that she has taken all of her medications as prescribed but unfortunately she did not have any of her medications this morning as she was unable to eat breakfast prior to coming to her appointment with the began earlier in the morning.  She denies any chest pain, shortness of breath, palpitations, peripheral edema.  She does endorse anxiety and nervousness during appointments. She denies any hospitalizations or visits to the emergency department.  Home Medications    Current Outpatient Medications  Medication Sig  Dispense Refill   albuterol (PROVENTIL) (2.5 MG/3ML) 0.083% nebulizer solution Take 3 mLs (2.5 mg total) by nebulization every 6 (six) hours as needed for wheezing or shortness of breath. 75 mL 12   amLODipine (NORVASC) 2.5 MG tablet Take 1 tablet (2.5 mg total) by mouth daily. 90 tablet 3   Cyanocobalamin (B-12) 5000 MCG CAPS Take 1,000 mcg by mouth daily.      ezetimibe (ZETIA) 10 MG tablet Take 1 tablet (10 mg total) by mouth daily. 30 tablet 0   Ferrous Sulfate (IRON PO) Take 65 mg of iron by mouth. Taking 1 tablet daily     fluticasone (FLONASE) 50 MCG/ACT nasal spray Place 2 sprays into both nostrils daily. 16 g 0   furosemide (LASIX) 20 MG tablet Take 1 tablet (20 mg total) by mouth daily. 90 tablet 3   GARLIC PO Take 1 tablet by mouth daily.     hydrALAZINE (APRESOLINE) 25 MG tablet Take 1 tablet (25 mg total) by mouth 2 (two) times daily. 180 tablet 3   KRILL OIL PO Take 1 capsule by mouth daily.     lisinopril (ZESTRIL) 20 MG tablet Take 2 tablets (40 mg total) by mouth daily. 180 tablet 3   metoprolol  succinate (TOPROL-XL) 25 MG 24 hr tablet Take 1 tablet (25 mg total) by mouth daily. 90 tablet 3   nitroGLYCERIN (NITROSTAT) 0.4 MG SL tablet Place 1 tablet (0.4 mg total) under the tongue every 5 (five) minutes as needed for chest pain. 30 tablet 3   ondansetron (ZOFRAN ODT) 4 MG disintegrating tablet Take 1 tablet (4 mg total) by mouth every 8 (eight) hours as needed. 20 tablet 1   pantoprazole (PROTONIX) 40 MG tablet Take by mouth.     potassium chloride SA (KLOR-CON) 20 MEQ tablet Take 1 tablet (20 mEq total) by mouth daily. 90 tablet 3   simvastatin (ZOCOR) 40 MG tablet Take 1 tablet (40 mg total) by mouth daily. 90 tablet 3   tamoxifen (NOLVADEX) 20 MG tablet Take 1 tablet (20 mg total) by mouth daily. 90 tablet 3   sodium chloride 1 g tablet Take 1 g by mouth daily. (Patient not taking: Reported on 01/08/2022)     No current facility-administered medications for this visit.      Family History    Family History  Problem Relation Age of Onset   Heart failure Mother    Epilepsy Mother    COPD Father    Heart disease Father    Anxiety disorder Sister    Arthritis Brother    Heart disease Brother    Vaginal cancer Paternal Grandmother    Heart attack Paternal Grandfather    COPD Brother    Kidney failure Brother    COPD Brother    Arthritis Sister    Uterine cancer Other    Diabetes Other    Colon cancer Neg Hx    Stomach cancer Neg Hx    Breast cancer Neg Hx    She indicated that her mother is deceased. She indicated that her father is deceased. She indicated that both of her sisters are alive. She indicated that four of her five brothers are alive. She indicated that her maternal grandmother is deceased. She indicated that her maternal grandfather is deceased. She indicated that her paternal grandmother is deceased. She indicated that her paternal grandfather is deceased. She indicated that the status of her neg hx is unknown. She indicated that the status of her other is unknown.  Social History    Social History   Socioeconomic History   Marital status: Married    Spouse name: Not on file   Number of children: 2   Years of education: Not on file   Highest education level: Not on file  Occupational History   Not on file  Tobacco Use   Smoking status: Former    Packs/day: 0.75    Years: 40.00    Total pack years: 30.00    Types: Cigarettes    Quit date: 11/07/2003    Years since quitting: 18.2   Smokeless tobacco: Never  Vaping Use   Vaping Use: Never used  Substance and Sexual Activity   Alcohol use: Yes    Alcohol/week: 1.0 - 3.0 standard drink of alcohol    Types: 1 - 3 Glasses of wine per week    Comment: Occasionally.     Drug use: No   Sexual activity: Not on file  Other Topics Concern   Not on file  Social History Narrative   She drinks 1 to 2 caffeinated beverages a day   Social Determinants of Health   Financial  Resource Strain: Not on file  Food Insecurity: Not on file  Transportation Needs: Not  on file  Physical Activity: Not on file  Stress: Not on file  Social Connections: Not on file  Intimate Partner Violence: Not on file     Review of Systems    General:  No chills, fever, night sweats or weight changes.  Cardiovascular:  No chest pain, dyspnea on exertion, edema, orthopnea, palpitations, paroxysmal nocturnal dyspnea. Dermatological: No rash, lesions/masses Respiratory: No cough, dyspnea Urologic: No hematuria, dysuria Abdominal:   No nausea, vomiting, diarrhea, bright red blood per rectum, melena, or hematemesis Neurologic:  No visual changes, wkns, changes in mental status. Endorses anxiety with appointments to any office. All other systems reviewed and are otherwise negative except as noted above.   Physical Exam    VS:  BP (!) 150/80 (BP Location: Right Arm, Patient Position: Sitting, Cuff Size: Normal)   Pulse (!) 53   Ht '5\' 1"'$  (1.549 m)   Wt 149 lb 2 oz (67.6 kg)   SpO2 98%   BMI 28.18 kg/m  , BMI Body mass index is 28.18 kg/m.     Vitals:   01/16/22 0910 01/16/22 0918  BP: (!) 160/80 (!) 150/80   GEN: Well nourished, well developed, in no acute distress. HEENT: normal. Neck: Supple, no JVD, carotid bruits, or masses. Cardiac: RRR, no murmurs, rubs, or gallops. No clubbing, cyanosis, trace edema.  Radials/DP/PT 2+ and equal bilaterally.  Respiratory:  Respirations regular and unlabored, clear to auscultation bilaterally. GI: Soft, nontender, nondistended, BS + x 4. MS: no deformity or atrophy. Skin: warm and dry, no rash. Neuro:  Strength and sensation are intact. Psych: Normal affect.  Accessory Clinical Findings    ECG personally reviewed by me today-sinus bradycardia with a first-degree AV block, LVH with early repolarization- No acute changes  Lab Results  Component Value Date   WBC 5.2 01/08/2022   HGB 10.9 (L) 01/08/2022   HCT 32.8 (L) 01/08/2022    MCV 102.2 (H) 01/08/2022   PLT 228 01/08/2022   Lab Results  Component Value Date   CREATININE 1.31 (H) 01/08/2022   BUN 20 01/08/2022   NA 131 (L) 01/08/2022   K 4.2 01/08/2022   CL 100 01/08/2022   CO2 24 01/08/2022   Lab Results  Component Value Date   ALT 13 01/08/2022   AST 22 01/08/2022   ALKPHOS 41 01/08/2022   BILITOT 0.4 01/08/2022   Lab Results  Component Value Date   CHOL 147 06/05/2019   HDL 53 06/05/2019   LDLCALC 75 06/05/2019   TRIG 101 06/05/2019   CHOLHDL 2.8 06/05/2019    No results found for: "HGBA1C"  Assessment & Plan   1.  Essential hypertension with blood pressure of 160/80 recheck 150/80.  Patient has not had any of her medications thus far today.  She states that her blood pressures run higher when she comes to appointments. She has been encouraged to continue with monitoring her blood pressure at home and upload her blood pressures to her MyChart after taking her blood pressure 1 to 2 hours after taking her medication.  She states she has taking her home blood pressure cuff to her appointment with her PCP to validate manual versus automatic blood pressures and to determine whether she needs to purchase a new cuff.  Without knowing what her actual blood pressure is when she is on any of her medications, at this point she has been continued on amlodipine 2.5 mg daily, hydralazine 25 mg, twice daily, lisinopril 40 mg daily, and  Toprol XL 25  mg daily.  She requires no refills at this time  2.  History of non-ST elevated myocardial infarction with no obstructive lesions found on cardiac catheterization 06/2016.  She did have a has significant coronary calcification in the LAD.  She has been continued on simvastatin and Zetia.   3.  Hypercholesterolemia with a last LDL of 38 on 05/28/2021.  Total cholesterol was 103, triglycerides were 99, and HDL was 44.9.  She is continued on simvastatin 40 mg daily, Krill oil 1 capsule daily, and Zetia 10 mg daily.  This  continues to be followed by her PCP.  4.  Anxiety which she continues with her walking/exercise programming.  She continues to walk greater than 1 mile daily.  5.  Hyponatremia with a last sodium level of 131 on 01/08/2022.  She states she is no longer taking sodium tablets but continues to avoid free water.  This continues to be followed by nephrology  6.  Disposition patient return to clinic to see MD/APP in 11 to 12 months with EKG on return or sooner if needed.   Samra Pesch, NP 01/16/2022, 10:35 AM

## 2022-01-14 LAB — MULTIPLE MYELOMA PANEL, SERUM
Albumin SerPl Elph-Mcnc: 3.7 g/dL (ref 2.9–4.4)
Albumin/Glob SerPl: 1.1 (ref 0.7–1.7)
Alpha 1: 0.2 g/dL (ref 0.0–0.4)
Alpha2 Glob SerPl Elph-Mcnc: 0.7 g/dL (ref 0.4–1.0)
B-Globulin SerPl Elph-Mcnc: 1 g/dL (ref 0.7–1.3)
Gamma Glob SerPl Elph-Mcnc: 1.7 g/dL (ref 0.4–1.8)
Globulin, Total: 3.7 g/dL (ref 2.2–3.9)
IgA: 242 mg/dL (ref 64–422)
IgG (Immunoglobin G), Serum: 1871 mg/dL — ABNORMAL HIGH (ref 586–1602)
IgM (Immunoglobulin M), Srm: 18 mg/dL — ABNORMAL LOW (ref 26–217)
Total Protein ELP: 7.4 g/dL (ref 6.0–8.5)

## 2022-01-16 ENCOUNTER — Ambulatory Visit: Payer: PPO | Attending: Cardiology | Admitting: Cardiology

## 2022-01-16 ENCOUNTER — Encounter: Payer: Self-pay | Admitting: Cardiology

## 2022-01-16 VITALS — BP 150/80 | HR 53 | Ht 61.0 in | Wt 149.1 lb

## 2022-01-16 DIAGNOSIS — I10 Essential (primary) hypertension: Secondary | ICD-10-CM | POA: Diagnosis not present

## 2022-01-16 DIAGNOSIS — I25110 Atherosclerotic heart disease of native coronary artery with unstable angina pectoris: Secondary | ICD-10-CM

## 2022-01-16 DIAGNOSIS — E871 Hypo-osmolality and hyponatremia: Secondary | ICD-10-CM | POA: Diagnosis not present

## 2022-01-16 DIAGNOSIS — F419 Anxiety disorder, unspecified: Secondary | ICD-10-CM | POA: Diagnosis not present

## 2022-01-16 DIAGNOSIS — E7800 Pure hypercholesterolemia, unspecified: Secondary | ICD-10-CM | POA: Diagnosis not present

## 2022-01-16 NOTE — Patient Instructions (Signed)
Medication Instructions:  Your physician recommends that you continue on your current medications as directed. Please refer to the Current Medication list given to you today.  *If you need a refill on your cardiac medications before your next appointment, please call your pharmacy*  Please take your blood pressure daily for 2 weeks and send in a MyChart message. Please include heart rates.   HOW TO TAKE YOUR BLOOD PRESSURE: Rest 5 minutes before taking your blood pressure. Don't smoke or drink caffeinated beverages for at least 30 minutes before. Take your blood pressure before (not after) you eat. Sit comfortably with your back supported and both feet on the floor (don't cross your legs). Elevate your arm to heart level on a table or a desk. Use the proper sized cuff. It should fit smoothly and snugly around your bare upper arm. There should be enough room to slip a fingertip under the cuff. The bottom edge of the cuff should be 1 inch above the crease of the elbow. Ideally, take 3 measurements at one sitting and record the average.    Lab Work: NONE ordered at this time of appointment   If you have labs (blood work) drawn today and your tests are completely normal, you will receive your results only by: [place] (if you have MyChart) OR A paper copy in the mail If you have any lab test that is abnormal or we need to change your treatment, we will call you to review the results.   Testing/Procedures: NONE ordered at this time of appointment   Follow-Up: At [facility], you and your health needs are our priority.  As part of our continuing mission to provide you with exceptional heart care, we have created designated Provider Care Teams.  These Care Teams include your primary Cardiologist (physician) and Advanced Practice Providers (APPs -  Physician Assistants and Nurse Practitioners) who all work together to provide you with the care you need, when you need it.  We  recommend signing up for the patient portal called "MyChart".  Sign up information is provided on this After Visit Summary.  MyChart is used to connect with patients for Virtual Visits (Telemedicine).  Patients are able to view lab/test results, encounter notes, upcoming appointments, etc.  Non-urgent messages can be sent to your provider as well.   To learn more about what you can do with MyChart, go to [email or web address].    Your next appointment:   1 year(s)  The format for your next appointment:   In Person  Provider:   You may see Ida Rogue, MD or one of the following Advanced Practice Providers on your designated Care Team:   Murray Hodgkins, NP Christell Faith, PA-C Cadence Kathlen Mody, PA-C Gerrie Nordmann, NP

## 2022-01-18 ENCOUNTER — Other Ambulatory Visit: Payer: Self-pay | Admitting: Internal Medicine

## 2022-01-18 DIAGNOSIS — D472 Monoclonal gammopathy: Secondary | ICD-10-CM

## 2022-01-19 DIAGNOSIS — H35033 Hypertensive retinopathy, bilateral: Secondary | ICD-10-CM | POA: Diagnosis not present

## 2022-01-21 DIAGNOSIS — D631 Anemia in chronic kidney disease: Secondary | ICD-10-CM | POA: Diagnosis not present

## 2022-01-21 DIAGNOSIS — N1832 Chronic kidney disease, stage 3b: Secondary | ICD-10-CM | POA: Diagnosis not present

## 2022-01-21 DIAGNOSIS — R809 Proteinuria, unspecified: Secondary | ICD-10-CM | POA: Diagnosis not present

## 2022-01-21 DIAGNOSIS — E871 Hypo-osmolality and hyponatremia: Secondary | ICD-10-CM | POA: Diagnosis not present

## 2022-01-21 DIAGNOSIS — I10 Essential (primary) hypertension: Secondary | ICD-10-CM | POA: Diagnosis not present

## 2022-02-02 ENCOUNTER — Other Ambulatory Visit: Payer: Self-pay | Admitting: Cardiovascular Disease

## 2022-04-15 ENCOUNTER — Other Ambulatory Visit: Payer: Self-pay | Admitting: Cardiovascular Disease

## 2022-04-15 DIAGNOSIS — I10 Essential (primary) hypertension: Secondary | ICD-10-CM

## 2022-05-12 DIAGNOSIS — N1832 Chronic kidney disease, stage 3b: Secondary | ICD-10-CM | POA: Diagnosis not present

## 2022-05-26 DIAGNOSIS — N1832 Chronic kidney disease, stage 3b: Secondary | ICD-10-CM | POA: Diagnosis not present

## 2022-05-26 DIAGNOSIS — N2581 Secondary hyperparathyroidism of renal origin: Secondary | ICD-10-CM | POA: Diagnosis not present

## 2022-05-26 DIAGNOSIS — I10 Essential (primary) hypertension: Secondary | ICD-10-CM | POA: Diagnosis not present

## 2022-05-26 DIAGNOSIS — D631 Anemia in chronic kidney disease: Secondary | ICD-10-CM | POA: Diagnosis not present

## 2022-06-16 DIAGNOSIS — N1832 Chronic kidney disease, stage 3b: Secondary | ICD-10-CM | POA: Diagnosis not present

## 2022-06-16 DIAGNOSIS — E871 Hypo-osmolality and hyponatremia: Secondary | ICD-10-CM | POA: Diagnosis not present

## 2022-06-16 DIAGNOSIS — I25119 Atherosclerotic heart disease of native coronary artery with unspecified angina pectoris: Secondary | ICD-10-CM | POA: Diagnosis not present

## 2022-06-16 DIAGNOSIS — I10 Essential (primary) hypertension: Secondary | ICD-10-CM | POA: Diagnosis not present

## 2022-06-16 DIAGNOSIS — E7800 Pure hypercholesterolemia, unspecified: Secondary | ICD-10-CM | POA: Diagnosis not present

## 2022-06-16 DIAGNOSIS — R7302 Impaired glucose tolerance (oral): Secondary | ICD-10-CM | POA: Diagnosis not present

## 2022-06-16 DIAGNOSIS — J449 Chronic obstructive pulmonary disease, unspecified: Secondary | ICD-10-CM | POA: Diagnosis not present

## 2022-07-15 DIAGNOSIS — N1832 Chronic kidney disease, stage 3b: Secondary | ICD-10-CM | POA: Diagnosis not present

## 2022-07-15 DIAGNOSIS — J449 Chronic obstructive pulmonary disease, unspecified: Secondary | ICD-10-CM | POA: Diagnosis not present

## 2022-07-15 DIAGNOSIS — C50412 Malignant neoplasm of upper-outer quadrant of left female breast: Secondary | ICD-10-CM | POA: Diagnosis not present

## 2022-07-15 DIAGNOSIS — I25119 Atherosclerotic heart disease of native coronary artery with unspecified angina pectoris: Secondary | ICD-10-CM | POA: Diagnosis not present

## 2022-07-15 DIAGNOSIS — I10 Essential (primary) hypertension: Secondary | ICD-10-CM | POA: Diagnosis not present

## 2022-07-15 DIAGNOSIS — E871 Hypo-osmolality and hyponatremia: Secondary | ICD-10-CM | POA: Diagnosis not present

## 2022-07-15 DIAGNOSIS — M7062 Trochanteric bursitis, left hip: Secondary | ICD-10-CM | POA: Diagnosis not present

## 2022-07-15 DIAGNOSIS — M349 Systemic sclerosis, unspecified: Secondary | ICD-10-CM | POA: Diagnosis not present

## 2022-07-15 DIAGNOSIS — R7302 Impaired glucose tolerance (oral): Secondary | ICD-10-CM | POA: Diagnosis not present

## 2022-07-15 DIAGNOSIS — N2581 Secondary hyperparathyroidism of renal origin: Secondary | ICD-10-CM | POA: Diagnosis not present

## 2022-07-15 DIAGNOSIS — E7800 Pure hypercholesterolemia, unspecified: Secondary | ICD-10-CM | POA: Diagnosis not present

## 2022-08-03 ENCOUNTER — Other Ambulatory Visit: Payer: Self-pay | Admitting: *Deleted

## 2022-08-03 MED ORDER — POTASSIUM CHLORIDE CRYS ER 20 MEQ PO TBCR: 20.0000 meq | EXTENDED_RELEASE_TABLET | Freq: Every day | ORAL | 1 refills | Status: DC

## 2022-08-03 MED ORDER — SIMVASTATIN 40 MG PO TABS: ORAL_TABLET | ORAL | 1 refills | Status: DC

## 2022-08-03 NOTE — Telephone Encounter (Signed)
01/16/22 no change in med with f/u plan of 11-12  months, not scheduled at t his time

## 2022-08-11 DIAGNOSIS — N2581 Secondary hyperparathyroidism of renal origin: Secondary | ICD-10-CM | POA: Diagnosis not present

## 2022-08-11 DIAGNOSIS — D631 Anemia in chronic kidney disease: Secondary | ICD-10-CM | POA: Diagnosis not present

## 2022-08-11 DIAGNOSIS — R809 Proteinuria, unspecified: Secondary | ICD-10-CM | POA: Diagnosis not present

## 2022-08-11 DIAGNOSIS — E871 Hypo-osmolality and hyponatremia: Secondary | ICD-10-CM | POA: Diagnosis not present

## 2022-08-11 DIAGNOSIS — I10 Essential (primary) hypertension: Secondary | ICD-10-CM | POA: Diagnosis not present

## 2022-08-11 DIAGNOSIS — N1832 Chronic kidney disease, stage 3b: Secondary | ICD-10-CM | POA: Diagnosis not present

## 2022-08-26 ENCOUNTER — Telehealth: Payer: Self-pay | Admitting: *Deleted

## 2022-08-26 DIAGNOSIS — N1832 Chronic kidney disease, stage 3b: Secondary | ICD-10-CM | POA: Diagnosis not present

## 2022-08-26 DIAGNOSIS — I10 Essential (primary) hypertension: Secondary | ICD-10-CM | POA: Diagnosis not present

## 2022-08-26 DIAGNOSIS — N2581 Secondary hyperparathyroidism of renal origin: Secondary | ICD-10-CM | POA: Diagnosis not present

## 2022-08-26 DIAGNOSIS — D631 Anemia in chronic kidney disease: Secondary | ICD-10-CM | POA: Diagnosis not present

## 2022-08-26 DIAGNOSIS — E871 Hypo-osmolality and hyponatremia: Secondary | ICD-10-CM | POA: Diagnosis not present

## 2022-08-26 NOTE — Telephone Encounter (Signed)
Call from Dr Garnett Farm office reporting that per Dr Cherylann Ratel, patient anemia is worsening and he would like for her to be seen in May and not wait to be seen in August. If you have questions regarding this you can call Victorino Dike back [phone] ext [phone].   Recent Data from [facility] Related to CBC and Differential Component 08/11/22 05/12/22 01/12/22 09/16/21 05/12/21 01/13/21  WBC 4.1 5.9 5.5 6.7 5.6 6.1  RBC 2.85 Low  3.12 Low  3.00 Low  3.18 Low  3.39 Low  3.36 Low   Hemoglobin 9.8 Low  10.5 Low  10.3 Low  11.1 Low  11.4 Low  11.3 Low   Hematocrit 29.2 Low  31.6 Low  30.6 Low  32.6 Low  34.5 Low  35.1  MCV 102.5 High  101.3 High  102 High  102.5 High  101.8 High  104.5 High   MCH 34.4 High  33.7 High  34.3 High  34.9 High  33.6 High  33.6 High   MCHC 33.6 33.2 33.7 34.0 33.0 32.2  RDW 12.1 13.0 12.7 12.2 12.3 12.0  Platelets 276 234 224 234 257 233  MPV 9.9 10.2 -- 10.2 10.1 11.1  Neutrophils Absolute 2,259 3,381 3.2 4,348 3,438 3,849  Band Neutrophils Absolute, Manual Count CANCELED  CANCELED  -- CANCELED  CANCELED  CANCELED   Metamyelocytes Absolute CANCELED  CANCELED  -- CANCELED  CANCELED  CANCELED   Absolute Myelocytes CANCELED  CANCELED  -- CANCELED  CANCELED  CANCELED   Absolute Promyelocytes CANCELED  CANCELED  -- CANCELED  CANCELED  CANCELED   Lymphocytes Absolute 976 1,434 1.2 1,434 1,120 1,391  Monocytes Absolute 595 802 0.8 549 806 439  Eosinophils Absolute 221 201 0.2 308 168 311  Basophils Absolute 49 83 0.1 60 67 49  Blasts Absolute CANCELED  CANCELED  -- CANCELED  CANCELED  CANCELED   NRBC Absolute CANCELED  CANCELED  -- CANCELED  CANCELED  CANCELED   Neutrophils Relative 55.1 57.3 59 64.9 61.4 63.1  Bands Absolute CANCELED  CANCELED  -- CANCELED  CANCELED  CANCELED   Metamyelocytes Percent CANCELED  CANCELED  -- CANCELED  CANCELED  CANCELED   Myelocytes Relative CANCELED  CANCELED  -- CANCELED  CANCELED  CANCELED   Promyelocytes Relative CANCELED  CANCELED  --  CANCELED  CANCELED  CANCELED   Lymphocytes 23.8 24.3 -- 21.4 20.0 22.8  Variant lymphocytes/100 WBC (Bld) CANCELED  CANCELED  -- CANCELED  CANCELED  CANCELED   Monocytes 14.5 13.6 15 8.2 14.4 7.2  Eosinophils 5.4 3.4 -- 4.6 3.0 5.1  Basophils Relative 1.2 1.4 1 0.9 1.2 0.8  Blasts CANCELED  CANCELED  -- CANCELED  CANCELED  CANCELED   nRBC CANCELED  CANCELED  -- CANCELED  CANCELED  CANCELED   Comment(s) CANCELED  CANCELED  -- CANCELED  CANCELED  --   View all related data

## 2022-08-27 ENCOUNTER — Encounter: Payer: Self-pay | Admitting: Hematology and Oncology

## 2022-09-01 ENCOUNTER — Inpatient Hospital Stay: Payer: PPO | Admitting: Internal Medicine

## 2022-09-01 ENCOUNTER — Inpatient Hospital Stay: Payer: PPO

## 2022-09-01 ENCOUNTER — Encounter: Payer: Self-pay | Admitting: Internal Medicine

## 2022-09-01 ENCOUNTER — Inpatient Hospital Stay: Payer: PPO | Attending: Internal Medicine

## 2022-09-01 VITALS — BP 146/62 | HR 65 | Temp 98.1°F | Resp 18 | Ht 61.0 in | Wt 146.0 lb

## 2022-09-01 DIAGNOSIS — Z87891 Personal history of nicotine dependence: Secondary | ICD-10-CM | POA: Insufficient documentation

## 2022-09-01 DIAGNOSIS — Z170 Estrogen receptor positive status [ER+]: Secondary | ICD-10-CM

## 2022-09-01 DIAGNOSIS — D631 Anemia in chronic kidney disease: Secondary | ICD-10-CM | POA: Insufficient documentation

## 2022-09-01 DIAGNOSIS — C50412 Malignant neoplasm of upper-outer quadrant of left female breast: Secondary | ICD-10-CM

## 2022-09-01 DIAGNOSIS — Z90710 Acquired absence of both cervix and uterus: Secondary | ICD-10-CM | POA: Insufficient documentation

## 2022-09-01 DIAGNOSIS — N189 Chronic kidney disease, unspecified: Secondary | ICD-10-CM

## 2022-09-01 DIAGNOSIS — N183 Chronic kidney disease, stage 3 unspecified: Secondary | ICD-10-CM | POA: Diagnosis not present

## 2022-09-01 DIAGNOSIS — Z79810 Long term (current) use of selective estrogen receptor modulators (SERMs): Secondary | ICD-10-CM | POA: Diagnosis not present

## 2022-09-01 DIAGNOSIS — D472 Monoclonal gammopathy: Secondary | ICD-10-CM

## 2022-09-01 LAB — COMPREHENSIVE METABOLIC PANEL
ALT: 12 U/L (ref 0–44)
AST: 24 U/L (ref 15–41)
Albumin: 3.7 g/dL (ref 3.5–5.0)
Alkaline Phosphatase: 34 U/L — ABNORMAL LOW (ref 38–126)
Anion gap: 12 (ref 5–15)
BUN: 12 mg/dL (ref 8–23)
CO2: 21 mmol/L — ABNORMAL LOW (ref 22–32)
Calcium: 8.6 mg/dL — ABNORMAL LOW (ref 8.9–10.3)
Chloride: 102 mmol/L (ref 98–111)
Creatinine, Ser: 1.33 mg/dL — ABNORMAL HIGH (ref 0.44–1.00)
GFR, Estimated: 41 mL/min — ABNORMAL LOW (ref >60)
Glucose, Bld: 86 mg/dL (ref 70–99)
Potassium: 3.2 mmol/L — ABNORMAL LOW (ref 3.5–5.1)
Sodium: 135 mmol/L (ref 135–145)
Total Bilirubin: 0.3 mg/dL (ref 0.3–1.2)
Total Protein: 7.1 g/dL (ref 6.5–8.1)

## 2022-09-01 LAB — CBC WITH DIFFERENTIAL/PLATELET
Abs Immature Granulocytes: 0.02 10*3/uL (ref 0.00–0.07)
Basophils Absolute: 0.1 10*3/uL (ref 0.0–0.1)
Basophils Relative: 1 %
Eosinophils Absolute: 0.4 10*3/uL (ref 0.0–0.5)
Eosinophils Relative: 7 %
HCT: 28.5 % — ABNORMAL LOW (ref 36.0–46.0)
Hemoglobin: 9.5 g/dL — ABNORMAL LOW (ref 12.0–15.0)
Immature Granulocytes: 0 %
Lymphocytes Relative: 17 %
Lymphs Abs: 0.9 10*3/uL (ref 0.7–4.0)
MCH: 34.2 pg — ABNORMAL HIGH (ref 26.0–34.0)
MCHC: 33.3 g/dL (ref 30.0–36.0)
MCV: 102.5 fL — ABNORMAL HIGH (ref 80.0–100.0)
Monocytes Absolute: 0.8 10*3/uL (ref 0.1–1.0)
Monocytes Relative: 15 %
Neutro Abs: 3.2 10*3/uL (ref 1.7–7.7)
Neutrophils Relative %: 60 %
Platelets: 216 10*3/uL (ref 150–400)
RBC: 2.78 MIL/uL — ABNORMAL LOW (ref 3.87–5.11)
RDW: 12.6 % (ref 11.5–15.5)
WBC: 5.4 10*3/uL (ref 4.0–10.5)
nRBC: 0 % (ref 0.0–0.2)

## 2022-09-01 LAB — IRON AND TIBC
Iron: 36 ug/dL (ref 28–170)
Saturation Ratios: 13 % (ref 10.4–31.8)
TIBC: 283 ug/dL (ref 250–450)
UIBC: 247 ug/dL

## 2022-09-01 LAB — VITAMIN B12: Vitamin B-12: 1200 pg/mL — ABNORMAL HIGH (ref 180–914)

## 2022-09-01 LAB — FERRITIN: Ferritin: 80 ng/mL (ref 11–307)

## 2022-09-01 LAB — FOLATE: Folate: 9.4 ng/mL (ref >5.9)

## 2022-09-01 LAB — LACTATE DEHYDROGENASE: LDH: 172 U/L (ref 98–192)

## 2022-09-01 MED ORDER — EPOETIN ALFA-EPBX 10000 UNIT/ML IJ SOLN
20000.0000 [IU] | Freq: Once | INTRAMUSCULAR | Status: AC
  Administered 2022-09-01: 20000 [IU] via SUBCUTANEOUS

## 2022-09-01 NOTE — Assessment & Plan Note (Addendum)
#   Stage IIA left breast cancer- Invasive lobular carcinoma s/p mastec;[Uterus-intact] 4.2 cm lesion; T2, N1 (iso).; ER/ PR positive; started tamoxifen October 2017[? Ext Tam]; clinically no evidence of recurrence.  Continue tamoxifen for now [until 2027]. AUG 2023- Right Mammo- WNL.  Stable.   # Moderate macrocytic anemia secondary ?CKD [s/p bone marrow biopsy-March 2019; dr.C-nonspecific]hemoglobin: 9.5 progressively getting worse patient symptomatic ; continue PO iron/d; prior EPO.  Restart Retacrit 20,000 units monthly.  Discussed that she might need IV iron infusion based upon her iron levels pending today.  No prior infusions. Continue gentle iron [iron biglycinate; 28 mg ] 1 pill a day.   # Osteopenia August 2021-continue Fosamax calcium plus vitamin D; check BMD in Aug 2024  # Electrolyte abnormalities SIADH/chronic kidney disease-III [Dr.Lateef]; mild hypocalcemia; mild hypokalemia-.  Stable.  Continue close follow-up with nephrology.  #DISPOSITION: # Retacrit today. # Follow-up 4 weeks-MD labs CBC BMP; possible Retacrit or Venofer- Dr.B

## 2022-09-01 NOTE — Progress Notes (Signed)
[facility] CONSULT NOTE  Patient Care Team: Marina Goodell, MD as PCP - General (Family Medicine) Mariah Milling Tollie Pizza, MD as PCP - Cardiology (Cardiology) Lemar Livings, Merrily Pew, MD (General Surgery) Mady Haagensen, MD as Consulting Physician (Internal Medicine) Jesusita Oka, MD as Consulting Physician (Dermatology) Isla Pence, OD as Consulting Physician (Optometry) Mariah Milling, Tollie Pizza, MD as Consulting Physician (Cardiology) Tedd Sias Marlana Salvage, MD as Physician Assistant (Endocrinology) Duanne Guess, MD as Consulting Physician (General Surgery) Earna Coder, MD as Consulting Physician (Internal Medicine)  CHIEF COMPLAINTS/PURPOSE OF CONSULTATION: Breast cancer   Oncology History Overview Note  Bianca Shaw is a 77 y.o. female with stage IIA (T2N0) left breast cancer s/p mastectomy with sentinel lymph node biopsy on 12/26/2015.   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  Margins were negative.  Two sentinel lymph nodes were negative for macrometastasis, but with isolated tumor cells on IHC stains.  Three additional lymph nodes were positive for isolated tumor cells on IHC.  Tumor was ER positive (> 90%), PR positive (> 90%), and Her2/neu 2+ (eqivocal).  Her2/neu by FISH was negative.  Pathologic stage was pT2 pN0(i+).   MammaPrint testing revealed low risk luminal type A. There was a 97.8% probability of being disease free at 10 years with hormonal therapy.    Exam in 11/2016 revealed a 4 mm nodule in the left axillae above the mastectomy incision.  Excision biopsy on 12/15/2016 revealed fat necrosis and no malignancy.   Carcinoma of upper-outer quadrant of left breast in female, estrogen receptor positive (HCC)  12/06/2015 Initial Diagnosis   Carcinoma of upper-outer quadrant of left breast in female, estrogen receptor positive (HCC)     HISTORY OF PRESENTING ILLNESS: Walking independently.   With her husband.   Bianca Shaw 77 y.o.  female invasive lobular breast cancer T2N1 ER/PR positive HER2 negative on tamoxifen; macrocytic anemia ?  Chronic kidney disease is here for follow-up.  Visit was moved up as requested by kidney doctor. Her hgb is dropping. She does feel tired, dyspnea with exertion. Denies dizziness. No visible blood in stool. Denies pain.   Had a GI bug last week. Much better this week. Feeling back to baseline.    Patient denies any vaginal bleeding.  She has chronic vaginal discharge not any worse.  No blood in stools or black-colored stools.  Patient denies any unusual hot flashes or joint pains.  No unusual bone pain.  Review of Systems  Constitutional:  Negative for chills, diaphoresis, fever, malaise/fatigue and weight loss.  HENT:  Negative for nosebleeds and sore throat.   Eyes:  Negative for double vision.  Respiratory:  Negative for cough, hemoptysis, sputum production, shortness of breath and wheezing.   Cardiovascular:  Negative for chest pain, palpitations, orthopnea and leg swelling.  Gastrointestinal:  Negative for abdominal pain, blood in stool, constipation, diarrhea, heartburn, melena, nausea and vomiting.  Genitourinary:  Negative for dysuria, frequency and urgency.  Musculoskeletal:  Positive for joint pain. Negative for back pain.  Skin: Negative.  Negative for itching and rash.  Neurological:  Negative for dizziness, tingling, focal weakness, weakness and headaches.  Endo/Heme/Allergies:  Does not bruise/bleed easily.  Psychiatric/Behavioral:  Negative for depression. The patient is not nervous/anxious and does not have insomnia.      MEDICAL HISTORY:  Past Medical History:  Diagnosis Date   Anemia    Anxiety disorder    Breast cancer (HCC) 12/03/2015   lt breast  Breast cancer of upper-outer quadrant of left female breast (HCC) 11/2015   pT2 pN0(i+).;ER+; PR +, her 2 neu not overexpressed.  Mastectomy, SLN, Mammoprint: Low  risk.    Cancer (HCC) 12/03/2015   left breast/ INVASIVE LOBULAR CARCINOMA.    Chronic kidney disease    STAGE 3   COPD (chronic obstructive pulmonary disease) (HCC)    MILD   Cough    lingering, mild, finished Prednisone and anitbiotic 11/03/15   Dyspnea    DOE   Family history of adverse reaction to anesthesia    sister - PONV   GERD (gastroesophageal reflux disease)    Hypertension    Myocardial infarction (HCC)    Osteopenia    Osteoporosis    Pericarditis    diagnonsed June, 2010, unclear etiology as of yer   Personal history of tobacco use, presenting hazards to health 10/31/2015   Scleroderma (HCC)    ONLY ON SKIN-MILD   UTI (lower urinary tract infection)    Wears dentures    full upper    SURGICAL HISTORY: Past Surgical History:  Procedure Laterality Date   BREAST BIOPSY Right 2012   core - neg   BREAST BIOPSY Left 12/03/2015   INVASIVE LOBULAR CARCINOMA.    CARDIAC CATHETERIZATION     CATARACT EXTRACTION W/ INTRAOCULAR LENS IMPLANT Right    CATARACT EXTRACTION W/PHACO Left 11/17/2018   Procedure: CATARACT EXTRACTION PHACO AND INTRAOCULAR LENS PLACEMENT;  Surgeon: Elliot Cousin, MD;  Location: [facility];  Service: Ophthalmology;  Laterality: Left;  [place] 00:55 CDE 11.04 Fluid Pack Lot # [id number]   COLONOSCOPY WITH PROPOFOL N/A 11/08/2015   Procedure: COLONOSCOPY WITH PROPOFOL;  Surgeon: Midge Minium, MD;  Location: [facility];  Service: Endoscopy;  Laterality: N/A;   CORONARY ANGIOPLASTY     ESOPHAGOGASTRODUODENOSCOPY (EGD) WITH PROPOFOL N/A 11/18/2017   Procedure: ESOPHAGOGASTRODUODENOSCOPY (EGD) WITH PROPOFOL;  Surgeon: Pasty Spillers, MD;  Location: [facility];  Service: Endoscopy;  Laterality: N/A;   ESOPHAGOGASTRODUODENOSCOPY (EGD) WITH PROPOFOL N/A 02/15/2018   Procedure: ESOPHAGOGASTRODUODENOSCOPY (EGD) WITH PROPOFOL;  Surgeon: Wyline Mood, MD;  Location: [facility];  Service: Gastroenterology;  Laterality: N/A;   ESOPHAGOGASTRODUODENOSCOPY  (EGD) WITH PROPOFOL N/A 05/10/2018   Procedure: ESOPHAGOGASTRODUODENOSCOPY (EGD) WITH BIOPSIES;  Surgeon: Pasty Spillers, MD;  Location: [facility];  Service: Endoscopy;  Laterality: N/A;   EVACUATION BREAST HEMATOMA Left 01/14/2016   Procedure: EVACUATION HEMATOMA BREAST;  Surgeon: Earline Mayotte, MD;  Location: [facility];  Service: General;  Laterality: Left;   EYE SURGERY     MASTECTOMY Left 2017   complete mastectomy   MASTECTOMY W/ SENTINEL NODE BIOPSY Left 12/26/2015   Procedure: MASTECTOMY WITH SENTINEL LYMPH NODE BIOPSY;  Surgeon: Earline Mayotte, MD;  Location: [facility];  Service: General;  Laterality: Left;   RIGHT/LEFT HEART CATH AND CORONARY ANGIOGRAPHY N/A 07/22/2016   Procedure: Right/Left Heart Cath and Coronary Angiography;  Surgeon: Antonieta Iba, MD;  Location: [facility] IN[facility];  Service: Cardiovascular;  Laterality: N/A;   TUBAL LIGATION     VESICOVAGINAL FISTULA CLOSURE W/ TAH      SOCIAL HISTORY: Social History   Socioeconomic History   Marital status: Married    Spouse name: Not on file   Number of children: 2   Years of education: Not on file   Highest education level: Not on file  Occupational History   Not on file  Tobacco Use   Smoking status: Former    Packs/day: 0.75  Years: 40.00    Additional pack years: 0.00    Total pack years: 30.00    Types: Cigarettes    Quit date: 11/07/2003    Years since quitting: 18.8   Smokeless tobacco: Never  Vaping Use   Vaping Use: Never used  Substance and Sexual Activity   Alcohol use: Yes    Alcohol/week: 1.0 - 3.0 standard drink of alcohol    Types: 1 - 3 Glasses of wine per week    Comment: Occasionally.     Drug use: No   Sexual activity: Not on file  Other Topics Concern   Not on file  Social History Narrative   She drinks 1 to 2 caffeinated beverages a day   Social Determinants of Health   Financial Resource Strain: Not on file  Food Insecurity: Not on file  Transportation  Needs: Not on file  Physical Activity: Not on file  Stress: Not on file  Social Connections: Not on file  Intimate Partner Violence: Not on file    FAMILY HISTORY: Family History  Problem Relation Age of Onset   Heart failure Mother    Epilepsy Mother    COPD Father    Heart disease Father    Anxiety disorder Sister    Arthritis Brother    Heart disease Brother    Vaginal cancer Paternal Grandmother    Heart attack Paternal Grandfather    COPD Brother    Kidney failure Brother    COPD Brother    Arthritis Sister    Uterine cancer Other    Diabetes Other    Colon cancer Neg Hx    Stomach cancer Neg Hx    Breast cancer Neg Hx     ALLERGIES:  is allergic to pimenta and tomato.  MEDICATIONS:  Current Outpatient Medications  Medication Sig Dispense Refill   amLODipine (NORVASC) 2.5 MG tablet Take 1 tablet (2.5 mg total) by mouth daily. 90 tablet 3   Cyanocobalamin (B-12) 5000 MCG CAPS Take 1,000 mcg by mouth daily.      ezetimibe (ZETIA) 10 MG tablet Take 1 tablet (10 mg total) by mouth daily. 30 tablet 8   Ferrous Sulfate (IRON PO) Take 65 mg of iron by mouth. Taking 1 tablet daily     fluticasone (FLONASE) 50 MCG/ACT nasal spray Place 2 sprays into both nostrils daily. 16 g 0   furosemide (LASIX) 20 MG tablet Take 1 tablet (20 mg total) by mouth daily. 90 tablet 3   GARLIC PO Take 1 tablet by mouth daily.     hydrALAZINE (APRESOLINE) 25 MG tablet Take 1 tablet (25 mg total) by mouth 2 (two) times daily. 180 tablet 3   KRILL OIL PO Take 1 capsule by mouth daily.     lisinopril (ZESTRIL) 20 MG tablet Take 2 tablets (40 mg total) by mouth daily. 180 tablet 3   metoprolol succinate (TOPROL-XL) 25 MG 24 hr tablet Take 1 tablet (25 mg total) by mouth daily. 90 tablet 3   ondansetron (ZOFRAN ODT) 4 MG disintegrating tablet Take 1 tablet (4 mg total) by mouth every 8 (eight) hours as needed. 20 tablet 1   pantoprazole (PROTONIX) 40 MG tablet Take by mouth.     potassium chloride  SA (KLOR-CON M) 20 MEQ tablet Take 1 tablet (20 mEq total) by mouth daily. 90 tablet 1   simvastatin (ZOCOR) 40 MG tablet Take 1 tablet (40 mg total) by mouth daily. 90 tablet 1   sodium chloride 1 g  tablet Take 1 g by mouth daily.     tamoxifen (NOLVADEX) 20 MG tablet Take 1 tablet (20 mg total) by mouth daily. 90 tablet 3   albuterol (PROVENTIL) (2.5 MG/3ML) 0.083% nebulizer solution Take 3 mLs (2.5 mg total) by nebulization every 6 (six) hours as needed for wheezing or shortness of breath. (Patient not taking: Reported on 09/01/2022) 75 mL 12   nitroGLYCERIN (NITROSTAT) 0.4 MG SL tablet Place 1 tablet (0.4 mg total) under the tongue every 5 (five) minutes as needed for chest pain. (Patient not taking: Reported on 09/01/2022) 30 tablet 3   No current facility-administered medications for this visit.      Marland Kitchen  PHYSICAL EXAMINATION: ECOG PERFORMANCE STATUS: 1 - Symptomatic but completely ambulatory  Vitals:   09/01/22 0840  BP: (!) 146/62  Pulse: 65  Resp: 18  Temp: 98.1 F (36.7 C)  SpO2: 100%   Filed Weights   09/01/22 0840  Weight: 146 lb (66.2 kg)    Physical Exam Vitals and nursing note reviewed.  Constitutional:      Comments:      HENT:     Head: Normocephalic and atraumatic.     Mouth/Throat:     Pharynx: Oropharynx is clear.  Eyes:     Extraocular Movements: Extraocular movements intact.     Pupils: Pupils are equal, round, and reactive to light.  Cardiovascular:     Rate and Rhythm: Normal rate and regular rhythm.  Pulmonary:     Comments: Decreased breath sounds bilaterally.  Abdominal:     Palpations: Abdomen is soft.  Musculoskeletal:        General: Normal range of motion.     Cervical back: Normal range of motion.  Skin:    General: Skin is warm.  Neurological:     General: No focal deficit present.     Mental Status: She is alert and oriented to person, place, and time.  Psychiatric:        Behavior: Behavior normal.        Judgment: Judgment  normal.      LABORATORY DATA:  I have reviewed the data as listed Lab Results  Component Value Date   WBC 5.4 09/01/2022   HGB 9.5 (L) 09/01/2022   HCT 28.5 (L) 09/01/2022   MCV 102.5 (H) 09/01/2022   PLT 216 09/01/2022   Recent Labs    01/08/22 1111 09/01/22 0825  NA 131* 135  K 4.2 3.2*  CL 100 102  CO2 24 21*  GLUCOSE 100* 86  BUN 20 12  CREATININE 1.31* 1.33*  CALCIUM 9.1 8.6*  GFRNONAA 42* 41*  PROT 8.1 7.1  ALBUMIN 4.0 3.7  AST 22 24  ALT 13 12  ALKPHOS 41 34*  BILITOT 0.4 0.3    RADIOGRAPHIC STUDIES: I have personally reviewed the radiological images as listed and agreed with the findings in the report. No results found.  ASSESSMENT & PLAN:   Carcinoma of upper-outer quadrant of left breast in female, estrogen receptor positive (HCC) # Stage IIA left breast cancer- Invasive lobular carcinoma s/p mastec;[Uterus-intact] 4.2 cm lesion; T2, N1 (iso).; ER/ PR positive; started tamoxifen October 2017[? Ext Tam]; clinically no evidence of recurrence.  Continue tamoxifen for now [until 2027]. AUG 2023- Right Mammo- WNL.  Stable.   # Moderate macrocytic anemia secondary ?CKD [s/p bone marrow biopsy-March 2019; dr.C-nonspecific]hemoglobin: 9.5 progressively getting worse patient symptomatic ; continue PO iron/d; prior EPO.  Restart Retacrit 20,000 units monthly.  Discussed that she might  need IV iron infusion based upon her iron levels pending today.  No prior infusions. Continue gentle iron [iron biglycinate; 28 mg ] 1 pill a day.   # Osteopenia August 2021-continue Fosamax calcium plus vitamin D; check BMD in Aug 2024  # Electrolyte abnormalities SIADH/chronic kidney disease-III [Dr.Lateef]; mild hypocalcemia; mild hypokalemia-.  Stable.  Continue close follow-up with nephrology.  #DISPOSITION: # Retacrit today. # Follow-up 4 weeks-MD labs CBC BMP; possible Retacrit or Venofer- Dr.B   All questions were answered. The patient knows to call the clinic with any  problems, questions or concerns.    Earna Coder, MD 09/01/2022 9:54 AM

## 2022-09-01 NOTE — Progress Notes (Signed)
Visit was moved up as requested by kidney doctor. Her hgb is dropping. She does feel tired, dyspnea with exertion. Denies dizziness. No visible blood in stool. Denies pain. Had a GI bug last week. Much better this week. Feeling back to baseline.

## 2022-09-01 NOTE — Addendum Note (Signed)
Addended by: Darrold Span A on: 09/01/2022 09:57 AM   Modules accepted: Orders

## 2022-09-02 LAB — KAPPA/LAMBDA LIGHT CHAINS
Kappa free light chain: 99 mg/L — ABNORMAL HIGH (ref 3.3–19.4)
Kappa, lambda light chain ratio: 3.51 — ABNORMAL HIGH (ref 0.26–1.65)
Lambda free light chains: 28.2 mg/L — ABNORMAL HIGH (ref 5.7–26.3)

## 2022-09-02 LAB — CANCER ANTIGEN 27.29: CA 27.29: 15.7 U/mL (ref 0.0–38.6)

## 2022-09-06 LAB — MULTIPLE MYELOMA PANEL, SERUM
Albumin SerPl Elph-Mcnc: 3.6 g/dL (ref 2.9–4.4)
Albumin/Glob SerPl: 1.2 (ref 0.7–1.7)
Alpha 1: 0.3 g/dL (ref 0.0–0.4)
Alpha2 Glob SerPl Elph-Mcnc: 0.8 g/dL (ref 0.4–1.0)
B-Globulin SerPl Elph-Mcnc: 0.9 g/dL (ref 0.7–1.3)
Gamma Glob SerPl Elph-Mcnc: 1.1 g/dL (ref 0.4–1.8)
Globulin, Total: 3.1 g/dL (ref 2.2–3.9)
IgA: 175 mg/dL (ref 64–422)
IgG (Immunoglobin G), Serum: 1370 mg/dL (ref 586–1602)
IgM (Immunoglobulin M), Srm: 17 mg/dL — ABNORMAL LOW (ref 26–217)
Total Protein ELP: 6.7 g/dL (ref 6.0–8.5)

## 2022-10-05 MED FILL — Iron Sucrose Inj 20 MG/ML (Fe Equiv): INTRAVENOUS | Qty: 10 | Status: AC

## 2022-10-06 ENCOUNTER — Inpatient Hospital Stay: Payer: PPO

## 2022-10-06 ENCOUNTER — Encounter: Payer: Self-pay | Admitting: Internal Medicine

## 2022-10-06 ENCOUNTER — Encounter: Payer: Self-pay | Admitting: Hematology and Oncology

## 2022-10-06 ENCOUNTER — Inpatient Hospital Stay (HOSPITAL_BASED_OUTPATIENT_CLINIC_OR_DEPARTMENT_OTHER): Payer: PPO | Admitting: Internal Medicine

## 2022-10-06 ENCOUNTER — Inpatient Hospital Stay: Payer: PPO | Attending: Internal Medicine

## 2022-10-06 VITALS — BP 132/54 | HR 54

## 2022-10-06 VITALS — BP 134/56 | HR 66 | Temp 98.5°F | Ht 61.0 in | Wt 142.4 lb

## 2022-10-06 DIAGNOSIS — C50412 Malignant neoplasm of upper-outer quadrant of left female breast: Secondary | ICD-10-CM | POA: Insufficient documentation

## 2022-10-06 DIAGNOSIS — Z79899 Other long term (current) drug therapy: Secondary | ICD-10-CM | POA: Diagnosis not present

## 2022-10-06 DIAGNOSIS — Z170 Estrogen receptor positive status [ER+]: Secondary | ICD-10-CM | POA: Diagnosis not present

## 2022-10-06 DIAGNOSIS — N189 Chronic kidney disease, unspecified: Secondary | ICD-10-CM

## 2022-10-06 DIAGNOSIS — I129 Hypertensive chronic kidney disease with stage 1 through stage 4 chronic kidney disease, or unspecified chronic kidney disease: Secondary | ICD-10-CM | POA: Diagnosis not present

## 2022-10-06 DIAGNOSIS — E876 Hypokalemia: Secondary | ICD-10-CM | POA: Diagnosis not present

## 2022-10-06 DIAGNOSIS — N183 Chronic kidney disease, stage 3 unspecified: Secondary | ICD-10-CM | POA: Diagnosis present

## 2022-10-06 DIAGNOSIS — D631 Anemia in chronic kidney disease: Secondary | ICD-10-CM | POA: Insufficient documentation

## 2022-10-06 LAB — CBC WITH DIFFERENTIAL (CANCER CENTER ONLY)
Abs Immature Granulocytes: 0.01 10*3/uL (ref 0.00–0.07)
Basophils Absolute: 0.1 10*3/uL (ref 0.0–0.1)
Basophils Relative: 1 %
Eosinophils Absolute: 0.5 10*3/uL (ref 0.0–0.5)
Eosinophils Relative: 7 %
HCT: 30.7 % — ABNORMAL LOW (ref 36.0–46.0)
Hemoglobin: 10.2 g/dL — ABNORMAL LOW (ref 12.0–15.0)
Immature Granulocytes: 0 %
Lymphocytes Relative: 19 %
Lymphs Abs: 1.4 10*3/uL (ref 0.7–4.0)
MCH: 33.4 pg (ref 26.0–34.0)
MCHC: 33.2 g/dL (ref 30.0–36.0)
MCV: 100.7 fL — ABNORMAL HIGH (ref 80.0–100.0)
Monocytes Absolute: 1.1 10*3/uL — ABNORMAL HIGH (ref 0.1–1.0)
Monocytes Relative: 15 %
Neutro Abs: 4.2 10*3/uL (ref 1.7–7.7)
Neutrophils Relative %: 58 %
Platelet Count: 247 10*3/uL (ref 150–400)
RBC: 3.05 MIL/uL — ABNORMAL LOW (ref 3.87–5.11)
RDW: 12.4 % (ref 11.5–15.5)
WBC Count: 7.3 10*3/uL (ref 4.0–10.5)
nRBC: 0 % (ref 0.0–0.2)

## 2022-10-06 LAB — CMP (CANCER CENTER ONLY)
ALT: 13 U/L (ref 0–44)
AST: 19 U/L (ref 15–41)
Albumin: 3.7 g/dL (ref 3.5–5.0)
Alkaline Phosphatase: 36 U/L — ABNORMAL LOW (ref 38–126)
Anion gap: 10 (ref 5–15)
BUN: 30 mg/dL — ABNORMAL HIGH (ref 8–23)
CO2: 23 mmol/L (ref 22–32)
Calcium: 9 mg/dL (ref 8.9–10.3)
Chloride: 102 mmol/L (ref 98–111)
Creatinine: 2.07 mg/dL — ABNORMAL HIGH (ref 0.44–1.00)
GFR, Estimated: 24 mL/min — ABNORMAL LOW (ref >60)
Glucose, Bld: 104 mg/dL — ABNORMAL HIGH (ref 70–99)
Potassium: 4.2 mmol/L (ref 3.5–5.1)
Sodium: 135 mmol/L (ref 135–145)
Total Bilirubin: 0.2 mg/dL — ABNORMAL LOW (ref 0.3–1.2)
Total Protein: 7.8 g/dL (ref 6.5–8.1)

## 2022-10-06 MED ORDER — SODIUM CHLORIDE 0.9 % IV SOLN
200.0000 mg | Freq: Once | INTRAVENOUS | Status: AC
  Administered 2022-10-06: 200 mg via INTRAVENOUS
  Filled 2022-10-06: qty 10

## 2022-10-06 MED ORDER — SODIUM CHLORIDE 0.9 % IV SOLN
Freq: Once | INTRAVENOUS | Status: AC
  Filled 2022-10-06: qty 250

## 2022-10-06 NOTE — Assessment & Plan Note (Addendum)
#   Stage IIA left breast cancer- Invasive lobular carcinoma s/p mastec;[Uterus-intact] 4.2 cm lesion; T2, N1 (iso).; ER/ PR positive; started tamoxifen October 2017[? Ext Tam]; clinically no evidence of recurrence.  Continue tamoxifen for now [until 2027]. AUG 2023- Right Mammo- WNL.  Stable.   # Moderate macrocytic anemia secondary ?CKD [s/p bone marrow biopsy-March 2019; dr.C-nonspecific]; MAY 2024- b12- elevated.   # Today hemoglobin: 10; on Retacrit/ Venofer. HOLD retacrit today. Proceed with venofer; Discussed the potential acute infusion reactions with IV iron; which are quite rare.  Patient understands the risk; will proceed with infusions.  Continue gentle iron [iron biglycinate; 28 mg ] 1 pill a day.   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  Stable.  Continue close follow-up with nephrology.  #DISPOSITION: # HOLD Retacrit today; Venofer today.  # Follow-up 4 weeks-APP-  labs CBC BMP; vit D 25-OH; possible Retacrit or Venofer # Follow-up 8 weeks-MD  labs CBC BMP; iron studies; ferritin-possible Retacrit or Venofer- - Dr.B

## 2022-10-06 NOTE — Progress Notes (Signed)
[facility] CONSULT NOTE  Patient Care Team: Marina Goodell, MD as PCP - General (Family Medicine) Mariah Milling Tollie Pizza, MD as PCP - Cardiology (Cardiology) Lemar Livings, Merrily Pew, MD (General Surgery) Mady Haagensen, MD as Consulting Physician (Internal Medicine) Jesusita Oka, MD as Consulting Physician (Dermatology) Isla Pence, OD as Consulting Physician (Optometry) Mariah Milling, Tollie Pizza, MD as Consulting Physician (Cardiology) Tedd Sias Marlana Salvage, MD as Physician Assistant (Endocrinology) Duanne Guess, MD as Consulting Physician (General Surgery) Earna Coder, MD as Consulting Physician (Internal Medicine)  CHIEF COMPLAINTS/PURPOSE OF CONSULTATION: Breast cancer   Oncology History Overview Note  Bianca Shaw is a 77 y.o. female with stage IIA (T2N0) left breast cancer s/p mastectomy with sentinel lymph node biopsy on 12/26/2015.   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  Margins were negative.  Two sentinel lymph nodes were negative for macrometastasis, but with isolated tumor cells on IHC stains.  Three additional lymph nodes were positive for isolated tumor cells on IHC.  Tumor was ER positive (> 90%), PR positive (> 90%), and Her2/neu 2+ (eqivocal).  Her2/neu by FISH was negative.  Pathologic stage was pT2 pN0(i+).   MammaPrint testing revealed low risk luminal type A. There was a 97.8% probability of being disease free at 10 years with hormonal therapy.    Exam in 11/2016 revealed a 4 mm nodule in the left axillae above the mastectomy incision.  Excision biopsy on 12/15/2016 revealed fat necrosis and no malignancy.   Carcinoma of upper-outer quadrant of left breast in female, estrogen receptor positive (HCC)  12/06/2015 Initial Diagnosis   Carcinoma of upper-outer quadrant of left breast in female, estrogen receptor positive (HCC)     HISTORY OF PRESENTING ILLNESS: Walking independently.   With her husband.   Bianca Shaw 77 y.o.  female invasive lobular breast cancer T2N1 ER/PR positive HER2 negative on tamoxifen; [name]  Patient s/p Retacrit approximately month ago.  Noted to have mild improvement of energy loss.  Continues to feel fatigued.   No blood in stools or black-colored stools.  Patient denies any unusual hot flashes or joint pains.  No unusual bone pain.  Review of Systems  Constitutional:  Negative for chills, diaphoresis, fever, malaise/fatigue and weight loss.  HENT:  Negative for nosebleeds and sore throat.   Eyes:  Negative for double vision.  Respiratory:  Negative for cough, hemoptysis, sputum production, shortness of breath and wheezing.   Cardiovascular:  Negative for chest pain, palpitations, orthopnea and leg swelling.  Gastrointestinal:  Negative for abdominal pain, blood in stool, constipation, diarrhea, heartburn, melena, nausea and vomiting.  Genitourinary:  Negative for dysuria, frequency and urgency.  Musculoskeletal:  Positive for joint pain. Negative for back pain.  Skin: Negative.  Negative for itching and rash.  Neurological:  Negative for dizziness, tingling, focal weakness, weakness and headaches.  Endo/Heme/Allergies:  Does not bruise/bleed easily.  Psychiatric/Behavioral:  Negative for depression. The patient is not nervous/anxious and does not have insomnia.      MEDICAL HISTORY:  Past Medical History:  Diagnosis Date   Anemia    Anxiety disorder    Breast cancer (HCC) 12/03/2015   lt breast   Breast cancer of upper-outer quadrant of left female breast (HCC) 11/2015   pT2 pN0(i+).;ER+; PR +, her 2 neu not overexpressed.  Mastectomy, SLN, Mammoprint: Low risk.    Cancer (HCC) 12/03/2015   left  breast/ INVASIVE LOBULAR CARCINOMA.    Chronic kidney disease    STAGE 3   COPD (chronic obstructive pulmonary disease) (HCC)    MILD   Cough    lingering, mild,  finished Prednisone and anitbiotic 11/03/15   Dyspnea    DOE   Family history of adverse reaction to anesthesia    sister - PONV   GERD (gastroesophageal reflux disease)    Hypertension    Myocardial infarction (HCC)    Osteopenia    Osteoporosis    Pericarditis    diagnonsed June, 2010, unclear etiology as of yer   Personal history of tobacco use, presenting hazards to health 10/31/2015   Scleroderma (HCC)    ONLY ON SKIN-MILD   UTI (lower urinary tract infection)    Wears dentures    full upper    SURGICAL HISTORY: Past Surgical History:  Procedure Laterality Date   BREAST BIOPSY Right 2012   core - neg   BREAST BIOPSY Left 12/03/2015   INVASIVE LOBULAR CARCINOMA.    CARDIAC CATHETERIZATION     CATARACT EXTRACTION W/ INTRAOCULAR LENS IMPLANT Right    CATARACT EXTRACTION W/PHACO Left 11/17/2018   Procedure: CATARACT EXTRACTION PHACO AND INTRAOCULAR LENS PLACEMENT;  Surgeon: Elliot Cousin, MD;  Location: [facility];  Service: Ophthalmology;  Laterality: Left;  [place] 00:55 CDE 11.04 Fluid Pack Lot # [id number]   COLONOSCOPY WITH PROPOFOL N/A 11/08/2015   Procedure: COLONOSCOPY WITH PROPOFOL;  Surgeon: Midge Minium, MD;  Location: [facility];  Service: Endoscopy;  Laterality: N/A;   CORONARY ANGIOPLASTY     ESOPHAGOGASTRODUODENOSCOPY (EGD) WITH PROPOFOL N/A 11/18/2017   Procedure: ESOPHAGOGASTRODUODENOSCOPY (EGD) WITH PROPOFOL;  Surgeon: Pasty Spillers, MD;  Location: [facility];  Service: Endoscopy;  Laterality: N/A;   ESOPHAGOGASTRODUODENOSCOPY (EGD) WITH PROPOFOL N/A 02/15/2018   Procedure: ESOPHAGOGASTRODUODENOSCOPY (EGD) WITH PROPOFOL;  Surgeon: Wyline Mood, MD;  Location: [facility];  Service: Gastroenterology;  Laterality: N/A;   ESOPHAGOGASTRODUODENOSCOPY (EGD) WITH PROPOFOL N/A 05/10/2018   Procedure: ESOPHAGOGASTRODUODENOSCOPY (EGD) WITH BIOPSIES;  Surgeon: Pasty Spillers, MD;  Location: [facility];  Service: Endoscopy;  Laterality: N/A;    EVACUATION BREAST HEMATOMA Left 01/14/2016   Procedure: EVACUATION HEMATOMA BREAST;  Surgeon: Earline Mayotte, MD;  Location: [facility];  Service: General;  Laterality: Left;   EYE SURGERY     MASTECTOMY Left 2017   complete mastectomy   MASTECTOMY W/ SENTINEL NODE BIOPSY Left 12/26/2015   Procedure: MASTECTOMY WITH SENTINEL LYMPH NODE BIOPSY;  Surgeon: Earline Mayotte, MD;  Location: [facility];  Service: General;  Laterality: Left;   RIGHT/LEFT HEART CATH AND CORONARY ANGIOGRAPHY N/A 07/22/2016   Procedure: Right/Left Heart Cath and Coronary Angiography;  Surgeon: Antonieta Iba, MD;  Location: [facility] INV[facility]IVE CV [facility];  Service: Cardiovascular;  Laterality: N/A;   TUBAL LIGATION     VESICOVAGINAL FISTULA CLOSURE W/ TAH      SOCIAL HISTORY: Social History   Socioeconomic History   Marital status: Married    Spouse name: Not on file   Number of children: 2   Years of education: Not on file   Highest education level: Not on file  Occupational History   Not on file  Tobacco Use   Smoking status: Former    Packs/day: 0.75    Years: 40.00    Additional pack years: 0.00    Total pack years: 30.00    Types: Cigarettes    Quit date: 11/07/2003    Years since quitting: 18.9   Smokeless  tobacco: Never  Vaping Use   Vaping Use: Never used  Substance and Sexual Activity   Alcohol use: Yes    Alcohol/week: 1.0 - 3.0 standard drink of alcohol    Types: 1 - 3 Glasses of wine per week    Comment: Occasionally.     Drug use: No   Sexual activity: Not on file  Other Topics Concern   Not on file  Social History Narrative   She drinks 1 to 2 caffeinated beverages a day   Social Determinants of Health   Financial Resource Strain: Not on file  Food Insecurity: Not on file  Transportation Needs: Not on file  Physical Activity: Not on file  Stress: Not on file  Social Connections: Not on file  Intimate Partner Violence: Not on file    FAMILY HISTORY: Family History  Problem Relation  Age of Onset   Heart failure Mother    Epilepsy Mother    COPD Father    Heart disease Father    Anxiety disorder Sister    Arthritis Brother    Heart disease Brother    Vaginal cancer Paternal Grandmother    Heart attack Paternal Grandfather    COPD Brother    Kidney failure Brother    COPD Brother    Arthritis Sister    Uterine cancer Other    Diabetes Other    Colon cancer Neg Hx    Stomach cancer Neg Hx    Breast cancer Neg Hx     ALLERGIES:  is allergic to pimenta and tomato.  MEDICATIONS:  Current Outpatient Medications  Medication Sig Dispense Refill   amLODipine (NORVASC) 2.5 MG tablet Take 1 tablet (2.5 mg total) by mouth daily. 90 tablet 3   Cyanocobalamin (B-12) 5000 MCG CAPS Take 1,000 mcg by mouth daily.      ezetimibe (ZETIA) 10 MG tablet Take 1 tablet (10 mg total) by mouth daily. 30 tablet 8   Ferrous Sulfate (IRON PO) Take 65 mg of iron by mouth. Taking 1 tablet daily     fluticasone (FLONASE) 50 MCG/ACT nasal spray Place 2 sprays into both nostrils daily. 16 g 0   furosemide (LASIX) 20 MG tablet Take 1 tablet (20 mg total) by mouth daily. 90 tablet 3   GARLIC PO Take 1 tablet by mouth daily.     hydrALAZINE (APRESOLINE) 25 MG tablet Take 1 tablet (25 mg total) by mouth 2 (two) times daily. 180 tablet 3   KRILL OIL PO Take 1 capsule by mouth daily.     lisinopril (ZESTRIL) 20 MG tablet Take 2 tablets (40 mg total) by mouth daily. 180 tablet 3   metoprolol succinate (TOPROL-XL) 25 MG 24 hr tablet Take 1 tablet (25 mg total) by mouth daily. 90 tablet 3   ondansetron (ZOFRAN ODT) 4 MG disintegrating tablet Take 1 tablet (4 mg total) by mouth every 8 (eight) hours as needed. 20 tablet 1   pantoprazole (PROTONIX) 40 MG tablet Take by mouth.     potassium chloride SA (KLOR-CON M) 20 MEQ tablet Take 1 tablet (20 mEq total) by mouth daily. 90 tablet 1   simvastatin (ZOCOR) 40 MG tablet Take 1 tablet (40 mg total) by mouth daily. 90 tablet 1   sodium chloride 1 g  tablet Take 1 g by mouth daily.     tamoxifen (NOLVADEX) 20 MG tablet Take 1 tablet (20 mg total) by mouth daily. 90 tablet 3   albuterol (PROVENTIL) (2.5 MG/3ML) 0.083% nebulizer solution  Take 3 mLs (2.5 mg total) by nebulization every 6 (six) hours as needed for wheezing or shortness of breath. (Patient not taking: Reported on 09/01/2022) 75 mL 12   nitroGLYCERIN (NITROSTAT) 0.4 MG SL tablet Place 1 tablet (0.4 mg total) under the tongue every 5 (five) minutes as needed for chest pain. (Patient not taking: Reported on 09/01/2022) 30 tablet 3   No current facility-administered medications for this visit.   Facility-Administered Medications Ordered in Other Visits  Medication Dose Route Frequency Provider Last Rate Last Admin   iron sucrose (VENOFER) 200 mg in sodium chloride 0.9 % 100 mL IVPB  200 mg Intravenous Once Louretta Shorten R, MD          .  PHYSICAL EXAMINATION: ECOG PERFORMANCE STATUS: 1 - Symptomatic but completely ambulatory  Vitals:   10/06/22 1439  BP: (!) 134/56  Pulse: 66  Temp: 98.5 F (36.9 C)  SpO2: 98%   Filed Weights   10/06/22 1439  Weight: 142 lb 6.4 oz (64.6 kg)    Physical Exam Vitals and nursing note reviewed.  Constitutional:      Comments:      HENT:     Head: Normocephalic and atraumatic.     Mouth/Throat:     Pharynx: Oropharynx is clear.  Eyes:     Extraocular Movements: Extraocular movements intact.     Pupils: Pupils are equal, round, and reactive to light.  Cardiovascular:     Rate and Rhythm: Normal rate and regular rhythm.  Pulmonary:     Comments: Decreased breath sounds bilaterally.  Abdominal:     Palpations: Abdomen is soft.  Musculoskeletal:        General: Normal range of motion.     Cervical back: Normal range of motion.  Skin:    General: Skin is warm.  Neurological:     General: No focal deficit present.     Mental Status: She is alert and oriented to person, place, and time.  Psychiatric:        Behavior:  Behavior normal.        Judgment: Judgment normal.      LABORATORY DATA:  I have reviewed the data as listed Lab Results  Component Value Date   WBC 7.3 10/06/2022   HGB 10.2 (L) 10/06/2022   HCT 30.7 (L) 10/06/2022   MCV 100.7 (H) 10/06/2022   PLT 247 10/06/2022   Recent Labs    01/08/22 1111 09/01/22 0825 10/06/22 1439  NA 131* 135 135  K 4.2 3.2* 4.2  CL 100 102 102  CO2 24 21* 23  GLUCOSE 100* 86 104*  BUN 20 12 30*  CREATININE 1.31* 1.33* 2.07*  CALCIUM 9.1 8.6* 9.0  GFRNONAA 42* 41* 24*  PROT 8.1 7.1 7.8  ALBUMIN 4.0 3.7 3.7  AST 22 24 19   ALT 13 12 13   ALKPHOS 41 34* 36*  BILITOT 0.4 0.3 0.2*    RADIOGRAPHIC STUDIES: I have personally reviewed the radiological images as listed and agreed with the findings in the report. No results found.  ASSESSMENT & PLAN:   Carcinoma of upper-outer quadrant of left breast in female, estrogen receptor positive (HCC) # Stage IIA left breast cancer- Invasive lobular carcinoma s/p mastec;[Uterus-intact] 4.2 cm lesion; T2, N1 (iso).; ER/ PR positive; started tamoxifen October 2017[? Ext Tam]; clinically no evidence of recurrence.  Continue tamoxifen for now [until 2027]. AUG 2023- Right Mammo- WNL.  Stable.   # Moderate macrocytic anemia secondary ?CKD [s/p bone marrow biopsy-March  2019; dr.C-nonspecific]; MAY 2024- b12- elevated.   # Today hemoglobin: 10; on Retacrit/ Venofer. HOLD retacrit today. Proceed with venofer; Discussed the potential acute infusion reactions with IV iron; which are quite rare.  Patient understands the risk; will proceed with infusions.  Continue gentle iron [iron biglycinate; 28 mg ] 1 pill a day.   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  Stable.  Continue close follow-up with nephrology.  #DISPOSITION: # HOLD Retacrit today; Venofer  today.  # Follow-up 4 weeks-APP-  labs CBC BMP; vit D 25-OH; possible Retacrit or Venofer # Follow-up 8 weeks-MD  labs CBC BMP; iron studies; ferritin-possible Retacrit or Venofer- - Dr.B   All questions were answered. The patient knows to call the clinic with any problems, questions or concerns.    Earna Coder, MD 10/06/2022 3:47 PM

## 2022-10-06 NOTE — Progress Notes (Signed)
Fatigue/weakness: yes Dyspena: no Light headedness: no Blood in stool: no  

## 2022-11-05 ENCOUNTER — Inpatient Hospital Stay (HOSPITAL_BASED_OUTPATIENT_CLINIC_OR_DEPARTMENT_OTHER): Payer: PPO | Admitting: Nurse Practitioner

## 2022-11-05 ENCOUNTER — Inpatient Hospital Stay: Payer: PPO

## 2022-11-05 ENCOUNTER — Inpatient Hospital Stay: Payer: PPO | Attending: Internal Medicine

## 2022-11-05 ENCOUNTER — Encounter: Payer: Self-pay | Admitting: Nurse Practitioner

## 2022-11-05 VITALS — BP 120/58 | HR 61 | Temp 98.2°F | Wt 144.0 lb

## 2022-11-05 DIAGNOSIS — Z79899 Other long term (current) drug therapy: Secondary | ICD-10-CM | POA: Diagnosis not present

## 2022-11-05 DIAGNOSIS — E611 Iron deficiency: Secondary | ICD-10-CM | POA: Insufficient documentation

## 2022-11-05 DIAGNOSIS — Z08 Encounter for follow-up examination after completed treatment for malignant neoplasm: Secondary | ICD-10-CM | POA: Diagnosis not present

## 2022-11-05 DIAGNOSIS — Z853 Personal history of malignant neoplasm of breast: Secondary | ICD-10-CM

## 2022-11-05 DIAGNOSIS — C50412 Malignant neoplasm of upper-outer quadrant of left female breast: Secondary | ICD-10-CM | POA: Insufficient documentation

## 2022-11-05 DIAGNOSIS — Z170 Estrogen receptor positive status [ER+]: Secondary | ICD-10-CM | POA: Insufficient documentation

## 2022-11-05 DIAGNOSIS — N1832 Chronic kidney disease, stage 3b: Secondary | ICD-10-CM

## 2022-11-05 DIAGNOSIS — Z79810 Long term (current) use of selective estrogen receptor modulators (SERMs): Secondary | ICD-10-CM | POA: Insufficient documentation

## 2022-11-05 DIAGNOSIS — D631 Anemia in chronic kidney disease: Secondary | ICD-10-CM

## 2022-11-05 DIAGNOSIS — Z9012 Acquired absence of left breast and nipple: Secondary | ICD-10-CM | POA: Diagnosis not present

## 2022-11-05 DIAGNOSIS — N183 Chronic kidney disease, stage 3 unspecified: Secondary | ICD-10-CM | POA: Diagnosis not present

## 2022-11-05 DIAGNOSIS — Z87891 Personal history of nicotine dependence: Secondary | ICD-10-CM | POA: Insufficient documentation

## 2022-11-05 LAB — CBC WITH DIFFERENTIAL (CANCER CENTER ONLY)
Abs Immature Granulocytes: 0.02 10*3/uL (ref 0.00–0.07)
Basophils Absolute: 0.1 10*3/uL (ref 0.0–0.1)
Basophils Relative: 1 %
Eosinophils Absolute: 0.3 10*3/uL (ref 0.0–0.5)
Eosinophils Relative: 5 %
HCT: 29.6 % — ABNORMAL LOW (ref 36.0–46.0)
Hemoglobin: 9.7 g/dL — ABNORMAL LOW (ref 12.0–15.0)
Immature Granulocytes: 0 %
Lymphocytes Relative: 19 %
Lymphs Abs: 1.2 10*3/uL (ref 0.7–4.0)
MCH: 33.2 pg (ref 26.0–34.0)
MCHC: 32.8 g/dL (ref 30.0–36.0)
MCV: 101.4 fL — ABNORMAL HIGH (ref 80.0–100.0)
Monocytes Absolute: 0.8 10*3/uL (ref 0.1–1.0)
Monocytes Relative: 12 %
Neutro Abs: 4.2 10*3/uL (ref 1.7–7.7)
Neutrophils Relative %: 63 %
Platelet Count: 209 10*3/uL (ref 150–400)
RBC: 2.92 MIL/uL — ABNORMAL LOW (ref 3.87–5.11)
RDW: 12.5 % (ref 11.5–15.5)
WBC Count: 6.6 10*3/uL (ref 4.0–10.5)
nRBC: 0 % (ref 0.0–0.2)

## 2022-11-05 LAB — BASIC METABOLIC PANEL
Anion gap: 10 (ref 5–15)
BUN: 23 mg/dL (ref 8–23)
CO2: 21 mmol/L — ABNORMAL LOW (ref 22–32)
Calcium: 8.7 mg/dL — ABNORMAL LOW (ref 8.9–10.3)
Chloride: 101 mmol/L (ref 98–111)
Creatinine, Ser: 1.37 mg/dL — ABNORMAL HIGH (ref 0.44–1.00)
GFR, Estimated: 40 mL/min — ABNORMAL LOW (ref >60)
Glucose, Bld: 114 mg/dL — ABNORMAL HIGH (ref 70–99)
Potassium: 4.1 mmol/L (ref 3.5–5.1)
Sodium: 132 mmol/L — ABNORMAL LOW (ref 135–145)

## 2022-11-05 LAB — VITAMIN D 25 HYDROXY (VIT D DEFICIENCY, FRACTURES): Vit D, 25-Hydroxy: 48.91 ng/mL (ref 30–100)

## 2022-11-05 MED ORDER — EPOETIN ALFA-EPBX 20000 UNIT/ML IJ SOLN
20000.0000 [IU] | Freq: Once | INTRAMUSCULAR | Status: AC
  Administered 2022-11-05: 20000 [IU] via SUBCUTANEOUS

## 2022-11-05 NOTE — Progress Notes (Signed)
Bianca Shaw CONSULT NOTE  Patient Care Team: Marina Goodell, MD as PCP - General (Family Medicine) Mariah Milling Tollie Pizza, MD as PCP - Cardiology (Cardiology) Lemar Livings, Merrily Pew, MD (General Surgery) Mady Haagensen, MD as Consulting Physician (Internal Medicine) Jesusita Oka, MD as Consulting Physician (Dermatology) Isla Pence, OD as Consulting Physician (Optometry) Mariah Milling, Tollie Pizza, MD as Consulting Physician (Cardiology) Tedd Sias Marlana Salvage, MD as Physician Assistant (Endocrinology) Duanne Guess, MD as Consulting Physician (General Surgery) Earna Coder, MD as Consulting Physician (Internal Medicine)  CHIEF COMPLAINTS/PURPOSE OF CONSULTATION: Breast cancer Oncology History Overview Note  Bianca Shaw is a 77 y.o. female with stage IIA (T2N0) left breast cancer s/p mastectomy with sentinel lymph node biopsy on 12/26/2015.   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  Margins were negative.  Two sentinel lymph nodes were negative for macrometastasis, but with isolated tumor cells on IHC stains.  Three additional lymph nodes were positive for isolated tumor cells on IHC.  Tumor was ER positive (> 90%), PR positive (> 90%), and Her2/neu 2+ (eqivocal).  Her2/neu by FISH was negative.  Pathologic stage was pT2 pN0(i+).   MammaPrint testing revealed low risk luminal type A. There was a 97.8% probability of being disease free at 10 years with hormonal therapy.    Exam in 11/2016 revealed a 4 mm nodule in the left axillae above the mastectomy incision.  Excision biopsy on 12/15/2016 revealed fat necrosis and no malignancy.   Carcinoma of upper-outer quadrant of left breast in female, estrogen receptor positive (HCC)  12/06/2015 Initial Diagnosis   Carcinoma of upper-outer quadrant of left breast in female, estrogen receptor positive (HCC)     HISTORY OF PRESENTING ILLNESS: Walking independently. With  her husband.   Bianca Shaw 77 y.o. female with invasive lobular breast cancer, T2N1 ER/PR positive HER2 negative, on tamoxifen, with macrocytic anemia and CKD who returns to clinic for follow up and consideration of retacrit. Continues to feel fatigued. No blood in stools or black-colored stools. Denies any unusual hot flashes or joint pains.  No unusual bone pain.  Review of Systems  Constitutional:  Positive for malaise/fatigue. Negative for chills, diaphoresis, fever and weight loss.  HENT:  Negative for nosebleeds and sore throat.   Eyes:  Negative for double vision.  Respiratory:  Negative for cough, hemoptysis, sputum production, shortness of breath and wheezing.   Cardiovascular:  Negative for chest pain, palpitations, orthopnea and leg swelling.  Gastrointestinal:  Negative for abdominal pain, blood in stool, constipation, diarrhea, heartburn, melena, nausea and vomiting.  Genitourinary:  Negative for dysuria, frequency and urgency.  Musculoskeletal:  Positive for joint pain. Negative for back pain.  Skin: Negative.  Negative for itching and rash.  Neurological:  Negative for dizziness, tingling, focal weakness, weakness and headaches.  Endo/Heme/Allergies:  Does not bruise/bleed easily.  Psychiatric/Behavioral:  Negative for depression. The patient is not nervous/anxious and does not have insomnia.      MEDICAL HISTORY:  Past Medical History:  Diagnosis Date   Anemia    Anxiety disorder    Breast cancer (HCC) 12/03/2015   lt breast   Breast cancer of upper-outer quadrant of left female breast (HCC) 11/2015   pT2 pN0(i+).;ER+; PR +, her 2 neu not overexpressed.  Mastectomy, SLN, Mammoprint: Low risk.    Cancer (HCC) 12/03/2015   left breast/ INVASIVE LOBULAR CARCINOMA.    Chronic kidney disease    STAGE 3   COPD (chronic  obstructive pulmonary disease) (HCC)    MILD   Cough    lingering, mild, finished Prednisone and anitbiotic 11/03/15   Dyspnea    DOE   Family  history of adverse reaction to anesthesia    sister - PONV   GERD (gastroesophageal reflux disease)    Hypertension    Myocardial infarction (HCC)    Osteopenia    Osteoporosis    Pericarditis    diagnonsed June, 2010, unclear etiology as of yer   Personal history of tobacco use, presenting hazards to health 10/31/2015   Scleroderma (HCC)    ONLY ON SKIN-MILD   UTI (lower urinary tract infection)    Wears dentures    full upper    SURGICAL HISTORY: Past Surgical History:  Procedure Laterality Date   BREAST BIOPSY Right 2012   core - neg   BREAST BIOPSY Left 12/03/2015   INVASIVE LOBULAR CARCINOMA.    CARDIAC CATHETERIZATION     CATARACT EXTRACTION W/ INTRAOCULAR LENS IMPLANT Right    CATARACT EXTRACTION W/PHACO Left 11/17/2018   Procedure: CATARACT EXTRACTION PHACO AND INTRAOCULAR LENS PLACEMENT;  Surgeon: Elliot Cousin, MD;  Location: [facility];  Service: Ophthalmology;  Laterality: Left;  [place] 00:55 CDE 11.04 Fluid Pack Lot # [id number]   COLONOSCOPY WITH PROPOFOL N/A 11/08/2015   Procedure: COLONOSCOPY WITH PROPOFOL;  Surgeon: Midge Minium, MD;  Location: [facility];  Service: Endoscopy;  Laterality: N/A;   CORONARY ANGIOPLASTY     ESOPHAGOGASTRODUODENOSCOPY (EGD) WITH PROPOFOL N/A 11/18/2017   Procedure: ESOPHAGOGASTRODUODENOSCOPY (EGD) WITH PROPOFOL;  Surgeon: Pasty Spillers, MD;  Location: [facility];  Service: Endoscopy;  Laterality: N/A;   ESOPHAGOGASTRODUODENOSCOPY (EGD) WITH PROPOFOL N/A 02/15/2018   Procedure: ESOPHAGOGASTRODUODENOSCOPY (EGD) WITH PROPOFOL;  Surgeon: Wyline Mood, MD;  Location: [facility];  Service: Gastroenterology;  Laterality: N/A;   ESOPHAGOGASTRODUODENOSCOPY (EGD) WITH PROPOFOL N/A 05/10/2018   Procedure: ESOPHAGOGASTRODUODENOSCOPY (EGD) WITH BIOPSIES;  Surgeon: Pasty Spillers, MD;  Location: [facility];  Service: Endoscopy;  Laterality: N/A;   EVACUATION BREAST HEMATOMA Left 01/14/2016   Procedure: EVACUATION HEMATOMA  BREAST;  Surgeon: Earline Mayotte, MD;  Location: [facility];  Service: General;  Laterality: Left;   EYE SURGERY     MASTECTOMY Left 2017   complete mastectomy   MASTECTOMY W/ SENTINEL NODE BIOPSY Left 12/26/2015   Procedure: MASTECTOMY WITH SENTINEL LYMPH NODE BIOPSY;  Surgeon: Earline Mayotte, MD;  Location: [facility];  Service: General;  Laterality: Left;   RIGHT/LEFT HEART CATH AND CORONARY ANGIOGRAPHY N/A 07/22/2016   Procedure: Right/Left Heart Cath and Coronary Angiography;  Surgeon: Antonieta Iba, MD;  Location: [facility];  Service: Cardiovascular;  Laterality: N/A;   TUBAL LIGATION     VESICOVAGINAL FISTULA CLOSURE W/ TAH      SOCIAL HISTORY: Social History   Socioeconomic History   Marital status: Married    Spouse name: Not on file   Number of children: 2   Years of education: Not on file   Highest education level: Not on file  Occupational History   Not on file  Tobacco Use   Smoking status: Former    Current packs/day: 0.00    Average packs/day: 0.8 packs/day for 40.0 years (30.0 ttl pk-yrs)    Types: Cigarettes    Start date: 11/07/1963    Quit date: 11/07/2003    Years since quitting: 19.0   Smokeless tobacco: Never  Vaping Use   Vaping status: Never Used  Substance and Sexual Activity  Alcohol use: Yes    Alcohol/week: 1.0 - 3.0 standard drink of alcohol    Types: 1 - 3 Glasses of wine per week    Comment: Occasionally.     Drug use: No   Sexual activity: Not on file  Other Topics Concern   Not on file  Social History Narrative   She drinks 1 to 2 caffeinated beverages a day   Social Determinants of Health   Financial Resource Strain: Not on file  Food Insecurity: No Food Insecurity (05/26/2022)   [facility]   Hunger Vital Sign    Worried About Running Out of Food in the Last Year: Never true    Ran Out of Food in the Last Year: Never true  Transportation Needs: No Transportation Needs  (05/26/2022)   [facility]   [facility] - Transportation    Lack of Transportation (Medical): No    Lack of Transportation (Non-Medical): No  Physical Activity: Not on file  Stress: Not on file  Social Connections: Not on file  Intimate Partner Violence: Not on file    FAMILY HISTORY: Family History  Problem Relation Age of Onset   Heart failure Mother    Epilepsy Mother    COPD Father    Heart disease Father    Anxiety disorder Sister    Arthritis Brother    Heart disease Brother    Vaginal cancer Paternal Grandmother    Heart attack Paternal Grandfather    COPD Brother    Kidney failure Brother    COPD Brother    Arthritis Sister    Uterine cancer Other    Diabetes Other    Colon cancer Neg Hx    Stomach cancer Neg Hx    Breast cancer Neg Hx     ALLERGIES:  is allergic to pimenta and tomato.  MEDICATIONS:  Current Outpatient Medications  Medication Sig Dispense Refill   amLODipine (NORVASC) 2.5 MG tablet Take 1 tablet (2.5 mg total) by mouth daily. 90 tablet 3   Cyanocobalamin (B-12) 5000 MCG CAPS Take 1,000 mcg by mouth daily.      ezetimibe (ZETIA) 10 MG tablet Take 1 tablet (10 mg total) by mouth daily. 30 tablet 8   Ferrous Sulfate (IRON PO) Take 65 mg of iron by mouth. Taking 1 tablet daily     fluticasone (FLONASE) 50 MCG/ACT nasal spray Place 2 sprays into both nostrils daily. 16 g 0   furosemide (LASIX) 20 MG tablet Take 1 tablet (20 mg total) by mouth daily. 90 tablet 3   GARLIC PO Take 1 tablet by mouth daily.     hydrALAZINE (APRESOLINE) 25 MG tablet Take 1 tablet (25 mg total) by mouth 2 (two) times daily. 180 tablet 3   KRILL OIL PO Take 1 capsule by mouth daily.     lisinopril (ZESTRIL) 20 MG tablet Take 2 tablets (40 mg total) by mouth daily. 180 tablet 3   metoprolol succinate (TOPROL-XL) 25 MG 24 hr tablet Take 1 tablet (25 mg total) by mouth daily. 90 tablet 3   ondansetron (ZOFRAN ODT) 4 MG disintegrating tablet  Take 1 tablet (4 mg total) by mouth every 8 (eight) hours as needed. 20 tablet 1   pantoprazole (PROTONIX) 40 MG tablet Take by mouth.     potassium chloride SA (KLOR-CON M) 20 MEQ tablet Take 1 tablet (20 mEq total) by mouth daily. 90 tablet 1   simvastatin (ZOCOR) 40 MG tablet Take  1 tablet (40 mg total) by mouth daily. 90 tablet 1   sodium chloride 1 g tablet Take 1 g by mouth daily.     tamoxifen (NOLVADEX) 20 MG tablet Take 1 tablet (20 mg total) by mouth daily. 90 tablet 3   albuterol (PROVENTIL) (2.5 MG/3ML) 0.083% nebulizer solution Take 3 mLs (2.5 mg total) by nebulization every 6 (six) hours as needed for wheezing or shortness of breath. (Patient not taking: Reported on 09/01/2022) 75 mL 12   nitroGLYCERIN (NITROSTAT) 0.4 MG SL tablet Place 1 tablet (0.4 mg total) under the tongue every 5 (five) minutes as needed for chest pain. (Patient not taking: Reported on 09/01/2022) 30 tablet 3   No current facility-administered medications for this visit.    PHYSICAL EXAMINATION: ECOG PERFORMANCE STATUS: 1 - Symptomatic but completely ambulatory Vitals:   11/05/22 1312  BP: (!) 120/58  Pulse: 61  Temp: 98.2 F (36.8 C)   Filed Weights   11/05/22 1312  Weight: 144 lb (65.3 kg)   Physical Exam Vitals reviewed.  Constitutional:      Appearance: She is not ill-appearing.  HENT:     Head: Normocephalic.  Cardiovascular:     Rate and Rhythm: Normal rate and regular rhythm.  Pulmonary:     Effort: No respiratory distress.  Abdominal:     General: There is no distension.     Tenderness: There is no guarding.  Skin:    General: Skin is warm.     Coloration: Skin is not pale.  Neurological:     Mental Status: She is alert and oriented to person, place, and time.  Psychiatric:        Mood and Affect: Mood normal.        Behavior: Behavior normal.    LABORATORY DATA:  I have reviewed the data as listed Lab Results  Component Value Date   WBC 6.6 11/05/2022   HGB 9.7 (L)  11/05/2022   HCT 29.6 (L) 11/05/2022   MCV 101.4 (H) 11/05/2022   PLT 209 11/05/2022   Recent Labs    01/08/22 1111 09/01/22 0825 10/06/22 1439 11/05/22 1248  NA 131* 135 135 132*  K 4.2 3.2* 4.2 4.1  CL 100 102 102 101  CO2 24 21* 23 21*  GLUCOSE 100* 86 104* 114*  BUN 20 12 30* 23  CREATININE 1.31* 1.33* 2.07* 1.37*  CALCIUM 9.1 8.6* 9.0 8.7*  GFRNONAA 42* 41* 24* 40*  PROT 8.1 7.1 7.8  --   ALBUMIN 4.0 3.7 3.7  --   AST 22 24 19   --   ALT 13 12 13   --   ALKPHOS 41 34* 36*  --   BILITOT 0.4 0.3 0.2*  --    Iron/TIBC/Ferritin/ %Sat    Component Value Date/Time   IRON 36 09/01/2022 0825   IRON 39 05/24/2017 1613   TIBC 283 09/01/2022 0825   FERRITIN 80 09/01/2022 0825   IRONPCTSAT 13 09/01/2022 0825    RADIOGRAPHIC STUDIES: I have personally reviewed the radiological images as listed and agreed with the findings in the report. No results found.  ASSESSMENT & PLAN:   # Stage IIA left breast cancer- Invasive lobular carcinoma s/p mastec; [Uterus-intact] 4.2 cm lesion; T2, N1 (iso); ER/ PR positive; started tamoxifen October 2017[? Ext Tam]; clinically no evidence of recurrence.  Continue tamoxifen for now [until 2027]. AUG 2023- Right Mammo- WNL. Repeat mammogram next month.    # Moderate macrocytic anemia secondary ? CKD [s/p bone marrow  biopsy-March 2019; Dr.C- nonspecific]; MAY 2024- b12- elevated.    # Today hemoglobin: 9.7; on Retacrit/ Venofer. Last received retacrit 09/01/22. S/p venofer x 1 on 10/06/22. Proceed with retacrit today. Tolerating well.  Continue gentle iron [iron biglycinate; 28 mg ] 1 pill a day. Plan for additional venofer 200 mg in next 1-2 weeks.    # Osteopenia August 2021-continue Fosamax calcium plus vitamin D; check BMD in Aug 2024. Vitamin D level pending today.    # Electrolyte abnormalities SIADH/chronic kidney disease-IV- 24 [Dr.Lateef]; mild hypocalcemia; mild hypokalemia-. Stable.  Continue close follow-up with nephrology.    #DISPOSITION: # HOLD venofer. Give retacrit today 1-2 weeks- venofer 4 weeks- labs (cbc, bmp, ferritin, iron studies), Dr Donneta Romberg, +/- retacrit or venofer- la  No problem-specific Assessment & Plan notes found for this encounter.  All questions were answered. The patient knows to call the clinic with any problems, questions or concerns.  Alinda Dooms, NP 11/05/2022

## 2022-11-11 MED FILL — Iron Sucrose Inj 20 MG/ML (Fe Equiv): INTRAVENOUS | Qty: 10 | Status: AC

## 2022-11-12 ENCOUNTER — Inpatient Hospital Stay: Payer: PPO

## 2022-11-12 VITALS — BP 107/42 | HR 63 | Temp 97.2°F | Resp 18

## 2022-11-12 DIAGNOSIS — N183 Chronic kidney disease, stage 3 unspecified: Secondary | ICD-10-CM | POA: Diagnosis not present

## 2022-11-12 DIAGNOSIS — D631 Anemia in chronic kidney disease: Secondary | ICD-10-CM

## 2022-11-12 MED ORDER — SODIUM CHLORIDE 0.9 % IV SOLN
200.0000 mg | Freq: Once | INTRAVENOUS | Status: AC
  Administered 2022-11-12: 200 mg via INTRAVENOUS
  Filled 2022-11-12: qty 200

## 2022-11-12 MED ORDER — SODIUM CHLORIDE 0.9 % IV SOLN
Freq: Once | INTRAVENOUS | Status: AC
  Filled 2022-11-12: qty 250

## 2022-11-12 NOTE — Patient Instructions (Signed)
Iron Sucrose Injection What is this medication? IRON SUCROSE (EYE ern SOO krose) treats low levels of iron (iron deficiency anemia) in people with kidney disease. Iron is a mineral that plays an important role in making red blood cells, which carry oxygen from your lungs to the rest of your body. This medicine may be used for other purposes; ask your health care provider or pharmacist if you have questions. COMMON BRAND NAME(S): Venofer What should I tell my care team before I take this medication? They need to know if you have any of these conditions: Anemia not caused by low iron levels Heart disease High levels of iron in the blood Kidney disease Liver disease An unusual or allergic reaction to iron, other medications, foods, dyes, or preservatives Pregnant or trying to get pregnant Breastfeeding How should I use this medication? This medication is for infusion into a vein. It is given in a hospital or clinic setting. Talk to your care team about the use of this medication in children. While this medication may be prescribed for children as young as 2 years for selected conditions, precautions do apply. Overdosage: If you think you have taken too much of this medicine contact a poison control center or emergency room at once. NOTE: This medicine is only for you. Do not share this medicine with others. What if I miss a dose? Keep appointments for follow-up doses. It is important not to miss your dose. Call your care team if you are unable to keep an appointment. What may interact with this medication? Do not take this medication with any of the following: Deferoxamine Dimercaprol Other iron products This medication may also interact with the following: Chloramphenicol Deferasirox This list may not describe all possible interactions. Give your health care provider a list of all the medicines, herbs, non-prescription drugs, or dietary supplements you use. Also tell them if you smoke,  drink alcohol, or use illegal drugs. Some items may interact with your medicine. What should I watch for while using this medication? Visit your care team regularly. Tell your care team if your symptoms do not start to get better or if they get worse. You may need blood work done while you are taking this medication. You may need to follow a special diet. Talk to your care team. Foods that contain iron include: whole grains/cereals, dried fruits, beans, or peas, leafy green vegetables, and organ meats (liver, kidney). What side effects may I notice from receiving this medication? Side effects that you should report to your care team as soon as possible: Allergic reactions--skin rash, itching, hives, swelling of the face, lips, tongue, or throat Low blood pressure--dizziness, feeling faint or lightheaded, blurry vision Shortness of breath Side effects that usually do not require medical attention (report to your care team if they continue or are bothersome): Flushing Headache Joint pain Muscle pain Nausea Pain, redness, or irritation at injection site This list may not describe all possible side effects. Call your doctor for medical advice about side effects. You may report side effects to FDA at 1-800-FDA-1088. Where should I keep my medication? This medication is given in a hospital or clinic and will not be stored at home. NOTE: This sheet is a summary. It may not cover all possible information. If you have questions about this medicine, talk to your doctor, pharmacist, or health care provider.  2024 Elsevier/Gold Standard (2021-10-22 00:00:00)  

## 2022-12-01 ENCOUNTER — Inpatient Hospital Stay: Payer: PPO

## 2022-12-01 ENCOUNTER — Inpatient Hospital Stay: Payer: PPO | Attending: Internal Medicine

## 2022-12-01 ENCOUNTER — Encounter: Payer: Self-pay | Admitting: Internal Medicine

## 2022-12-01 ENCOUNTER — Inpatient Hospital Stay: Payer: PPO | Admitting: Internal Medicine

## 2022-12-01 VITALS — BP 145/53 | HR 62 | Temp 97.6°F | Ht 61.0 in | Wt 144.9 lb

## 2022-12-01 VITALS — BP 142/59 | HR 98

## 2022-12-01 DIAGNOSIS — Z79810 Long term (current) use of selective estrogen receptor modulators (SERMs): Secondary | ICD-10-CM | POA: Insufficient documentation

## 2022-12-01 DIAGNOSIS — M8580 Other specified disorders of bone density and structure, unspecified site: Secondary | ICD-10-CM | POA: Diagnosis not present

## 2022-12-01 DIAGNOSIS — C50412 Malignant neoplasm of upper-outer quadrant of left female breast: Secondary | ICD-10-CM | POA: Diagnosis not present

## 2022-12-01 DIAGNOSIS — R5383 Other fatigue: Secondary | ICD-10-CM | POA: Diagnosis not present

## 2022-12-01 DIAGNOSIS — E876 Hypokalemia: Secondary | ICD-10-CM | POA: Diagnosis not present

## 2022-12-01 DIAGNOSIS — D649 Anemia, unspecified: Secondary | ICD-10-CM | POA: Diagnosis not present

## 2022-12-01 DIAGNOSIS — N183 Chronic kidney disease, stage 3 unspecified: Secondary | ICD-10-CM | POA: Insufficient documentation

## 2022-12-01 DIAGNOSIS — Z170 Estrogen receptor positive status [ER+]: Secondary | ICD-10-CM

## 2022-12-01 DIAGNOSIS — Z87891 Personal history of nicotine dependence: Secondary | ICD-10-CM | POA: Insufficient documentation

## 2022-12-01 DIAGNOSIS — E878 Other disorders of electrolyte and fluid balance, not elsewhere classified: Secondary | ICD-10-CM | POA: Insufficient documentation

## 2022-12-01 DIAGNOSIS — N189 Chronic kidney disease, unspecified: Secondary | ICD-10-CM

## 2022-12-01 DIAGNOSIS — D631 Anemia in chronic kidney disease: Secondary | ICD-10-CM | POA: Insufficient documentation

## 2022-12-01 LAB — CBC WITH DIFFERENTIAL (CANCER CENTER ONLY)
Abs Immature Granulocytes: 0.01 10*3/uL (ref 0.00–0.07)
Basophils Absolute: 0.1 10*3/uL (ref 0.0–0.1)
Basophils Relative: 1 %
Eosinophils Absolute: 0.3 10*3/uL (ref 0.0–0.5)
Eosinophils Relative: 6 %
HCT: 31 % — ABNORMAL LOW (ref 36.0–46.0)
Hemoglobin: 10.2 g/dL — ABNORMAL LOW (ref 12.0–15.0)
Immature Granulocytes: 0 %
Lymphocytes Relative: 21 %
Lymphs Abs: 1.1 10*3/uL (ref 0.7–4.0)
MCH: 32.9 pg (ref 26.0–34.0)
MCHC: 32.9 g/dL (ref 30.0–36.0)
MCV: 100 fL (ref 80.0–100.0)
Monocytes Absolute: 0.7 10*3/uL (ref 0.1–1.0)
Monocytes Relative: 15 %
Neutro Abs: 3 10*3/uL (ref 1.7–7.7)
Neutrophils Relative %: 57 %
Platelet Count: 205 10*3/uL (ref 150–400)
RBC: 3.1 MIL/uL — ABNORMAL LOW (ref 3.87–5.11)
RDW: 13.5 % (ref 11.5–15.5)
WBC Count: 5.1 10*3/uL (ref 4.0–10.5)
nRBC: 0 % (ref 0.0–0.2)

## 2022-12-01 LAB — IRON AND TIBC
Iron: 102 ug/dL (ref 28–170)
Saturation Ratios: 34 % — ABNORMAL HIGH (ref 10.4–31.8)
TIBC: 302 ug/dL (ref 250–450)
UIBC: 200 ug/dL

## 2022-12-01 LAB — BASIC METABOLIC PANEL
Anion gap: 8 (ref 5–15)
BUN: 27 mg/dL — ABNORMAL HIGH (ref 8–23)
CO2: 22 mmol/L (ref 22–32)
Calcium: 8.6 mg/dL — ABNORMAL LOW (ref 8.9–10.3)
Chloride: 101 mmol/L (ref 98–111)
Creatinine, Ser: 1.66 mg/dL — ABNORMAL HIGH (ref 0.44–1.00)
GFR, Estimated: 32 mL/min — ABNORMAL LOW (ref >60)
Glucose, Bld: 110 mg/dL — ABNORMAL HIGH (ref 70–99)
Potassium: 4.1 mmol/L (ref 3.5–5.1)
Sodium: 131 mmol/L — ABNORMAL LOW (ref 135–145)

## 2022-12-01 LAB — FERRITIN: Ferritin: 122 ng/mL (ref 11–307)

## 2022-12-01 MED ORDER — SODIUM CHLORIDE 0.9 % IV SOLN
Freq: Once | INTRAVENOUS | Status: AC
  Filled 2022-12-01: qty 250

## 2022-12-01 MED ORDER — SODIUM CHLORIDE 0.9% FLUSH
10.0000 mL | Freq: Once | INTRAVENOUS | Status: AC | PRN
  Administered 2022-12-01: 10 mL
  Filled 2022-12-01: qty 10

## 2022-12-01 MED ORDER — SODIUM CHLORIDE 0.9 % IV SOLN
200.0000 mg | Freq: Once | INTRAVENOUS | Status: AC
  Administered 2022-12-01: 200 mg via INTRAVENOUS
  Filled 2022-12-01: qty 200

## 2022-12-01 NOTE — Progress Notes (Signed)
Would like rx/order for a new bra at [facility].

## 2022-12-01 NOTE — Progress Notes (Signed)
Patient tolerated Venofer infusion well. Explained recommendation of 30 min post monitoring. Patient refused to wait post monitoring. Educated on what signs to watch for & to call with any concerns. No questions, discharged. Stable  

## 2022-12-01 NOTE — Patient Instructions (Signed)
Iron Sucrose Injection What is this medication? IRON SUCROSE (EYE ern SOO krose) treats low levels of iron (iron deficiency anemia) in people with kidney disease. Iron is a mineral that plays an important role in making red blood cells, which carry oxygen from your lungs to the rest of your body. This medicine may be used for other purposes; ask your health care provider or pharmacist if you have questions. COMMON BRAND NAME(S): Venofer What should I tell my care team before I take this medication? They need to know if you have any of these conditions: Anemia not caused by low iron levels Heart disease High levels of iron in the blood Kidney disease Liver disease An unusual or allergic reaction to iron, other medications, foods, dyes, or preservatives Pregnant or trying to get pregnant Breastfeeding How should I use this medication? This medication is for infusion into a vein. It is given in a hospital or clinic setting. Talk to your care team about the use of this medication in children. While this medication may be prescribed for children as young as 2 years for selected conditions, precautions do apply. Overdosage: If you think you have taken too much of this medicine contact a poison control center or emergency room at once. NOTE: This medicine is only for you. Do not share this medicine with others. What if I miss a dose? Keep appointments for follow-up doses. It is important not to miss your dose. Call your care team if you are unable to keep an appointment. What may interact with this medication? Do not take this medication with any of the following: Deferoxamine Dimercaprol Other iron products This medication may also interact with the following: Chloramphenicol Deferasirox This list may not describe all possible interactions. Give your health care provider a list of all the medicines, herbs, non-prescription drugs, or dietary supplements you use. Also tell them if you smoke,  drink alcohol, or use illegal drugs. Some items may interact with your medicine. What should I watch for while using this medication? Visit your care team regularly. Tell your care team if your symptoms do not start to get better or if they get worse. You may need blood work done while you are taking this medication. You may need to follow a special diet. Talk to your care team. Foods that contain iron include: whole grains/cereals, dried fruits, beans, or peas, leafy green vegetables, and organ meats (liver, kidney). What side effects may I notice from receiving this medication? Side effects that you should report to your care team as soon as possible: Allergic reactions--skin rash, itching, hives, swelling of the face, lips, tongue, or throat Low blood pressure--dizziness, feeling faint or lightheaded, blurry vision Shortness of breath Side effects that usually do not require medical attention (report to your care team if they continue or are bothersome): Flushing Headache Joint pain Muscle pain Nausea Pain, redness, or irritation at injection site This list may not describe all possible side effects. Call your doctor for medical advice about side effects. You may report side effects to FDA at 1-800-FDA-1088. Where should I keep my medication? This medication is given in a hospital or clinic. It will not be stored at home. NOTE: This sheet is a summary. It may not cover all possible information. If you have questions about this medicine, talk to your doctor, pharmacist, or health care provider.  2024 Elsevier/Gold Standard (2022-09-18 00:00:00)

## 2022-12-01 NOTE — Assessment & Plan Note (Addendum)
#   Moderate macrocytic anemia secondary ?CKD [s/p bone marrow biopsy-March 2019; dr.C-nonspecific]; MAY 2024- b12- elevated.   # Today hemoglobin: 10; on Retacrit/ Venofer. HOLD retacrit today. On Gentle iron. Proceed with venofer.   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 Ext Tam]; clinically no evidence of recurrence.  Continue tamoxifen for now [until 2027]. AUG 2023- Right Mammo- WNL.  Stable. 2024- aug mammo-pending.   # Osteopenia August 2021-continue Fosamax calcium plus vitamin D; pending- BMD in Aug 2024  # Electrolyte abnormalities SIADH/chronic kidney disease-II- IV- 34 [Dr.Lateef]; mild hypocalcemia; mild hypokalemia-.  Stable.   #DISPOSITION: # HOLD Retacrit today; Venofer today.  # In 4 weeks- labs H&H- possible Retacrit # in 8 weeks- labs H&H- possible Retacrit # Follow-up 12 weeks-MD  labs CBC BMP; iron studies; ferritin-possible Retacrit or Venofer- - Dr.B

## 2022-12-01 NOTE — Progress Notes (Signed)
[facility] CONSULT NOTE  Patient Care Team: Marina Goodell, MD as PCP - General (Family Medicine) Mariah Milling Tollie Pizza, MD as PCP - Cardiology (Cardiology) Lemar Livings, Merrily Pew, MD (General Surgery) Mady Haagensen, MD as Consulting Physician (Internal Medicine) Jesusita Oka, MD as Consulting Physician (Dermatology) Isla Pence, OD as Consulting Physician (Optometry) Mariah Milling, Tollie Pizza, MD as Consulting Physician (Cardiology) Tedd Sias Marlana Salvage, MD as Physician Assistant (Endocrinology) Duanne Guess, MD as Consulting Physician (General Surgery) Earna Coder, MD as Consulting Physician (Internal Medicine)  CHIEF COMPLAINTS/PURPOSE OF CONSULTATION: Breast cancer   Oncology History Overview Note  Bianca Shaw is a 77 y.o. female with stage IIA (T2N0) left breast cancer s/p mastectomy with sentinel lymph node biopsy on 12/26/2015.   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  Margins were negative.  Two sentinel lymph nodes were negative for macrometastasis, but with isolated tumor cells on IHC stains.  Three additional lymph nodes were positive for isolated tumor cells on IHC.  Tumor was ER positive (> 90%), PR positive (> 90%), and Her2/neu 2+ (eqivocal).  Her2/neu by FISH was negative.  Pathologic stage was pT2 pN0(i+).   MammaPrint testing revealed low risk luminal type A. There was a 97.8% probability of being disease free at 10 years with hormonal therapy.    Exam in 11/2016 revealed a 4 mm nodule in the left axillae above the mastectomy incision.  Excision biopsy on 12/15/2016 revealed fat necrosis and no malignancy.   Carcinoma of upper-outer quadrant of left breast in female, estrogen receptor positive (HCC)  12/06/2015 Initial Diagnosis   Carcinoma of upper-outer quadrant of left breast in female, estrogen receptor positive (HCC)     HISTORY OF PRESENTING ILLNESS: Walking independently.   With her husband.   [name] 77 y.o.  female invasive lobular breast cancer T2N1 ER/PR positive HER2 negative on tamoxifen; [name]  Would like rx/order for a new bra at [facility].   Notes to have improvement of the Energy levels.   Patient denies any unusual hot flashes or joint pains.  No unusual bone pain.  Review of Systems  Constitutional:  Positive for malaise/fatigue. Negative for chills, diaphoresis, fever and weight loss.  HENT:  Negative for nosebleeds and sore throat.   Eyes:  Negative for double vision.  Respiratory:  Negative for cough, hemoptysis, sputum production, shortness of breath and wheezing.   Cardiovascular:  Negative for chest pain, palpitations, orthopnea and leg swelling.  Gastrointestinal:  Negative for abdominal pain, blood in stool, constipation, diarrhea, heartburn, melena, nausea and vomiting.  Genitourinary:  Negative for dysuria, frequency and urgency.  Musculoskeletal:  Positive for joint pain. Negative for back pain.  Skin: Negative.  Negative for itching and rash.  Neurological:  Negative for dizziness, tingling, focal weakness, weakness and headaches.  Endo/Heme/Allergies:  Does not bruise/bleed easily.  Psychiatric/Behavioral:  Negative for depression. The patient is not nervous/anxious and does not have insomnia.      MEDICAL HISTORY:  Past Medical History:  Diagnosis Date   Anemia    Anxiety disorder    Breast cancer (HCC) 12/03/2015   lt breast   Breast cancer of upper-outer quadrant of left female breast (HCC) 11/2015   pT2 pN0(i+).;ER+; PR +, her 2 neu not overexpressed.  Mastectomy, SLN, Mammoprint: Low risk.    Cancer (HCC) 12/03/2015   left breast/ INVASIVE LOBULAR CARCINOMA.  Chronic kidney disease    STAGE 3   COPD (chronic obstructive pulmonary disease) (HCC)    MILD   Cough    lingering, mild, finished Prednisone and anitbiotic 11/03/15    Dyspnea    DOE   Family history of adverse reaction to anesthesia    sister - PONV   GERD (gastroesophageal reflux disease)    Hypertension    Myocardial infarction (HCC)    Osteopenia    Osteoporosis    Pericarditis    diagnonsed June, 2010, unclear etiology as of yer   Personal history of tobacco use, presenting hazards to health 10/31/2015   Scleroderma (HCC)    ONLY ON SKIN-MILD   UTI (lower urinary tract infection)    Wears dentures    full upper    SURGICAL HISTORY: Past Surgical History:  Procedure Laterality Date   BREAST BIOPSY Right 2012   core - neg   BREAST BIOPSY Left 12/03/2015   INVASIVE LOBULAR CARCINOMA.    CARDIAC CATHETERIZATION     CATARACT EXTRACTION W/ INTRAOCULAR LENS IMPLANT Right    CATARACT EXTRACTION W/PHACO Left 11/17/2018   Procedure: CATARACT EXTRACTION PHACO AND INTRAOCULAR LENS PLACEMENT;  Surgeon: Elliot Cousin, MD;  Location: [facility] ORS;  Service: Ophthalmology;  Laterality: Left;  [place] 00:55 CDE 11.04 Fluid Pack Lot # [id number]   COLONOSCOPY WITH PROPOFOL N/A 11/08/2015   Procedure: COLONOSCOPY WITH PROPOFOL;  Surgeon: Midge Minium, MD;  Location: [facility];  Service: Endoscopy;  Laterality: N/A;   CORONARY ANGIOPLASTY     ESOPHAGOGASTRODUODENOSCOPY (EGD) WITH PROPOFOL N/A 11/18/2017   Procedure: ESOPHAGOGASTRODUODENOSCOPY (EGD) WITH PROPOFOL;  Surgeon: Pasty Spillers, MD;  Location: [facility] ENDOSCOPY;  Service: Endoscopy;  Laterality: N/A;   ESOPHAGOGASTRODUODENOSCOPY (EGD) WITH PROPOFOL N/A 02/15/2018   Procedure: ESOPHAGOGASTRODUODENOSCOPY (EGD) WITH PROPOFOL;  Surgeon: Wyline Mood, MD;  Location: [facility];  Service: Gastroenterology;  Laterality: N/A;   ESOPHAGOGASTRODUODENOSCOPY (EGD) WITH PROPOFOL N/A 05/10/2018   Procedure: ESOPHAGOGASTRODUODENOSCOPY (EGD) WITH BIOPSIES;  Surgeon: Pasty Spillers, MD;  Location: [facility];  Service: Endoscopy;  Laterality: N/A;   EVACUATION BREAST HEMATOMA Left 01/14/2016    Procedure: EVACUATION HEMATOMA BREAST;  Surgeon: Earline Mayotte, MD;  Location: [facility] OR[facility];  Service: General;  Laterality: Left;   EYE SURGERY     MASTECTOMY Left 2017   complete mastectomy   MASTECTOMY W/ SENTINEL NODE BIOPSY Left 12/26/2015   Procedure: MASTECTOMY WITH SENTINEL LYMPH NODE BIOPSY;  Surgeon: Earline Mayotte, MD;  Location: [facility] ORS;  Service: General;  Laterality: Left;   RIGHT/LEFT HEART CATH AND CORONARY ANGIOGRAPHY N/A 07/22/2016   Procedure: Right/Left Heart Cath and Coronary Angiography;  Surgeon: Antonieta Iba, MD;  Location: [facility] INVASIVE CV LAB;  Service: Cardiovascular;  Laterality: N/A;   TUBAL LIGATION     VESICOVAGINAL FISTULA CLOSURE W/ TAH      SOCIAL HISTORY: Social History   Socioeconomic History   Marital status: Married    Spouse name: Not on file   Number of children: 2   Years of education: Not on file   Highest education level: Not on file  Occupational History   Not on file  Tobacco Use   Smoking status: Former    Current packs/day: 0.00    Average packs/day: 0.8 packs/day for 40.0 years (30.0 ttl pk-yrs)    Types: Cigarettes    Start date: 11/07/1963    Quit date: 11/07/2003    Years since quitting: 19.0   Smokeless tobacco: Never  Vaping Use  Vaping status: Never Used  Substance and Sexual Activity   Alcohol use: Yes    Alcohol/week: 1.0 - 3.0 standard drink of alcohol    Types: 1 - 3 Glasses of wine per week    Comment: Occasionally.     Drug use: No   Sexual activity: Not on file  Other Topics Concern   Not on file  Social History Narrative   She drinks 1 to 2 caffeinated beverages a day   Social Determinants of Health   Financial Resource Strain: Not on file  Food Insecurity: No Food Insecurity (05/26/2022)   [facility]   Hunger Vital Sign    Worried About Running Out of Food in the Last Year: Never true    Ran Out of Food in the Last Year: Never true  Transportation Needs: No  Transportation Needs (05/26/2022)   [facility]   [facility] - Transportation    Lack of Transportation (Medical): No    Lack of Transportation (Non-Medical): No  Physical Activity: Not on file  Stress: Not on file  Social Connections: Not on file  Intimate Partner Violence: Not on file    FAMILY HISTORY: Family History  Problem Relation Age of Onset   Heart failure Mother    Epilepsy Mother    COPD Father    Heart disease Father    Anxiety disorder Sister    Arthritis Brother    Heart disease Brother    Vaginal cancer Paternal Grandmother    Heart attack Paternal Grandfather    COPD Brother    Kidney failure Brother    COPD Brother    Arthritis Sister    Uterine cancer Other    Diabetes Other    Colon cancer Neg Hx    Stomach cancer Neg Hx    Breast cancer Neg Hx     ALLERGIES:  is allergic to pimenta and tomato.  MEDICATIONS:  Current Outpatient Medications  Medication Sig Dispense Refill   amLODipine (NORVASC) 2.5 MG tablet Take 1 tablet (2.5 mg total) by mouth daily. 90 tablet 3   Cyanocobalamin (B-12) 5000 MCG CAPS Take 1,000 mcg by mouth daily.      ezetimibe (ZETIA) 10 MG tablet Take 1 tablet (10 mg total) by mouth daily. 30 tablet 8   Ferrous Sulfate (IRON PO) Take 65 mg of iron by mouth. Taking 1 tablet daily     fluticasone (FLONASE) 50 MCG/ACT nasal spray Place 2 sprays into both nostrils daily. 16 g 0   furosemide (LASIX) 20 MG tablet Take 1 tablet (20 mg total) by mouth daily. 90 tablet 3   GARLIC PO Take 1 tablet by mouth daily.     hydrALAZINE (APRESOLINE) 25 MG tablet Take 1 tablet (25 mg total) by mouth 2 (two) times daily. 180 tablet 3   KRILL OIL PO Take 1 capsule by mouth daily.     lisinopril (ZESTRIL) 20 MG tablet Take 2 tablets (40 mg total) by mouth daily. 180 tablet 3   metoprolol succinate (TOPROL-XL) 25 MG 24 hr tablet Take 1 tablet (25 mg total) by mouth daily. 90 tablet 3   ondansetron (ZOFRAN ODT) 4 MG  disintegrating tablet Take 1 tablet (4 mg total) by mouth every 8 (eight) hours as needed. 20 tablet 1   pantoprazole (PROTONIX) 40 MG tablet Take by mouth.     potassium chloride SA (KLOR-CON M) 20 MEQ tablet Take 1 tablet (20 mEq total) by mouth daily.  90 tablet 1   simvastatin (ZOCOR) 40 MG tablet Take 1 tablet (40 mg total) by mouth daily. 90 tablet 1   sodium chloride 1 g tablet Take 1 g by mouth daily.     tamoxifen (NOLVADEX) 20 MG tablet Take 1 tablet (20 mg total) by mouth daily. 90 tablet 3   albuterol (PROVENTIL) (2.5 MG/3ML) 0.083% nebulizer solution Take 3 mLs (2.5 mg total) by nebulization every 6 (six) hours as needed for wheezing or shortness of breath. (Patient not taking: Reported on 09/01/2022) 75 mL 12   nitroGLYCERIN (NITROSTAT) 0.4 MG SL tablet Place 1 tablet (0.4 mg total) under the tongue every 5 (five) minutes as needed for chest pain. (Patient not taking: Reported on 09/01/2022) 30 tablet 3   No current facility-administered medications for this visit.      Marland Kitchen  PHYSICAL EXAMINATION: ECOG PERFORMANCE STATUS: 1 - Symptomatic but completely ambulatory  Vitals:   12/01/22 1248  BP: (!) 145/53  Pulse: 62  Temp: 97.6 F (36.4 C)  SpO2: 98%   Filed Weights   12/01/22 1248  Weight: 144 lb 14.4 oz (65.7 kg)    Physical Exam Vitals and nursing note reviewed.  Constitutional:      Comments:      HENT:     Head: Normocephalic and atraumatic.     Mouth/Throat:     Pharynx: Oropharynx is clear.  Eyes:     Extraocular Movements: Extraocular movements intact.     Pupils: Pupils are equal, round, and reactive to light.  Cardiovascular:     Rate and Rhythm: Normal rate and regular rhythm.  Pulmonary:     Comments: Decreased breath sounds bilaterally.  Abdominal:     Palpations: Abdomen is soft.  Musculoskeletal:        General: Normal range of motion.     Cervical back: Normal range of motion.  Skin:    General: Skin is warm.  Neurological:     General: No  focal deficit present.     Mental Status: She is alert and oriented to person, place, and time.  Psychiatric:        Behavior: Behavior normal.        Judgment: Judgment normal.      LABORATORY DATA:  I have reviewed the data as listed Lab Results  Component Value Date   WBC 5.1 12/01/2022   HGB 10.2 (L) 12/01/2022   HCT 31.0 (L) 12/01/2022   MCV 100.0 12/01/2022   PLT 205 12/01/2022   Recent Labs    01/08/22 1111 09/01/22 0825 10/06/22 1439 11/05/22 1248 12/01/22 1251  NA 131* 135 135 132* 131*  K 4.2 3.2* 4.2 4.1 4.1  CL 100 102 102 101 101  CO2 24 21* 23 21* 22  GLUCOSE 100* 86 104* 114* 110*  BUN 20 12 30* 23 27*  CREATININE 1.31* 1.33* 2.07* 1.37* 1.66*  CALCIUM 9.1 8.6* 9.0 8.7* 8.6*  GFRNONAA 42* 41* 24* 40* 32*  PROT 8.1 7.1 7.8  --   --   ALBUMIN 4.0 3.7 3.7  --   --   AST 22 24 19   --   --   ALT 13 12 13   --   --   ALKPHOS 41 34* 36*  --   --   BILITOT 0.4 0.3 0.2*  --   --     RADIOGRAPHIC STUDIES: I have personally reviewed the radiological images as listed and agreed with the findings in the report. No results found.  ASSESSMENT & PLAN:   Carcinoma of upper-outer quadrant of left breast in female, estrogen receptor positive (HCC)    Symptomatic anemia # Moderate macrocytic anemia secondary ?CKD [s/p bone marrow biopsy-March 2019; dr.C-nonspecific]; MAY 2024- b12- elevated.   # Today hemoglobin: 10; on Retacrit/ Venofer. HOLD retacrit today. On Gentle iron. Proceed with venofer.   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 Ext Tam]; clinically no evidence of recurrence.  Continue tamoxifen for now [until 2027]. AUG 2023- Right Mammo- WNL.  Stable. 2024- aug mammo-pending.   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  Stable.   #DISPOSITION: # HOLD Retacrit today; Venofer today.  # In 4 weeks- labs H&H- possible Retacrit # in 8 weeks- labs H&H- possible Retacrit # Follow-up 12 weeks-MD  labs CBC BMP; iron studies; ferritin-possible Retacrit or Venofer- - Dr.B   All questions were answered. The patient knows to call the clinic with any problems, questions or concerns.    Earna Coder, MD 12/01/2022 1:22 PM

## 2022-12-03 ENCOUNTER — Ambulatory Visit
Admission: RE | Admit: 2022-12-03 | Discharge: 2022-12-03 | Disposition: A | Discharge status: Home / Self Care | Payer: PPO | Source: Ambulatory Visit | Attending: Internal Medicine | Admitting: Internal Medicine

## 2022-12-03 DIAGNOSIS — Z853 Personal history of malignant neoplasm of breast: Secondary | ICD-10-CM | POA: Insufficient documentation

## 2022-12-03 DIAGNOSIS — M85852 Other specified disorders of bone density and structure, left thigh: Secondary | ICD-10-CM | POA: Diagnosis not present

## 2022-12-03 DIAGNOSIS — Z780 Asymptomatic menopausal state: Secondary | ICD-10-CM | POA: Diagnosis not present

## 2022-12-03 DIAGNOSIS — J449 Chronic obstructive pulmonary disease, unspecified: Secondary | ICD-10-CM | POA: Diagnosis not present

## 2022-12-03 DIAGNOSIS — M8589 Other specified disorders of bone density and structure, multiple sites: Secondary | ICD-10-CM | POA: Insufficient documentation

## 2022-12-03 DIAGNOSIS — C50412 Malignant neoplasm of upper-outer quadrant of left female breast: Secondary | ICD-10-CM

## 2022-12-03 DIAGNOSIS — Z1231 Encounter for screening mammogram for malignant neoplasm of breast: Secondary | ICD-10-CM | POA: Diagnosis not present

## 2022-12-03 DIAGNOSIS — Z13820 Encounter for screening for osteoporosis: Secondary | ICD-10-CM | POA: Insufficient documentation

## 2022-12-05 ENCOUNTER — Encounter: Payer: Self-pay | Admitting: Nurse Practitioner

## 2022-12-08 DIAGNOSIS — I129 Hypertensive chronic kidney disease with stage 1 through stage 4 chronic kidney disease, or unspecified chronic kidney disease: Secondary | ICD-10-CM | POA: Diagnosis not present

## 2022-12-08 DIAGNOSIS — E785 Hyperlipidemia, unspecified: Secondary | ICD-10-CM | POA: Diagnosis not present

## 2022-12-08 DIAGNOSIS — J449 Chronic obstructive pulmonary disease, unspecified: Secondary | ICD-10-CM | POA: Diagnosis not present

## 2022-12-08 DIAGNOSIS — C50912 Malignant neoplasm of unspecified site of left female breast: Secondary | ICD-10-CM | POA: Diagnosis not present

## 2022-12-08 DIAGNOSIS — Z87891 Personal history of nicotine dependence: Secondary | ICD-10-CM | POA: Diagnosis not present

## 2022-12-08 DIAGNOSIS — N1832 Chronic kidney disease, stage 3b: Secondary | ICD-10-CM | POA: Diagnosis not present

## 2022-12-29 ENCOUNTER — Inpatient Hospital Stay: Payer: PPO

## 2022-12-29 ENCOUNTER — Inpatient Hospital Stay: Payer: PPO | Attending: Internal Medicine

## 2022-12-29 VITALS — BP 155/76

## 2022-12-29 DIAGNOSIS — C50412 Malignant neoplasm of upper-outer quadrant of left female breast: Secondary | ICD-10-CM | POA: Diagnosis not present

## 2022-12-29 DIAGNOSIS — D649 Anemia, unspecified: Secondary | ICD-10-CM

## 2022-12-29 DIAGNOSIS — N189 Chronic kidney disease, unspecified: Secondary | ICD-10-CM

## 2022-12-29 DIAGNOSIS — Z170 Estrogen receptor positive status [ER+]: Secondary | ICD-10-CM | POA: Diagnosis not present

## 2022-12-29 LAB — HEMOGLOBIN AND HEMATOCRIT (CANCER CENTER ONLY)
HCT: 30.9 % — ABNORMAL LOW (ref 36.0–46.0)
Hemoglobin: 10.1 g/dL — ABNORMAL LOW (ref 12.0–15.0)

## 2022-12-29 MED ORDER — EPOETIN ALFA-EPBX 20000 UNIT/ML IJ SOLN
20000.0000 [IU] | Freq: Once | INTRAMUSCULAR | Status: DC
  Filled 2022-12-29: qty 1

## 2022-12-30 ENCOUNTER — Encounter: Payer: Self-pay | Admitting: Hematology and Oncology

## 2022-12-30 DIAGNOSIS — I10 Essential (primary) hypertension: Secondary | ICD-10-CM | POA: Diagnosis not present

## 2022-12-30 DIAGNOSIS — N1832 Chronic kidney disease, stage 3b: Secondary | ICD-10-CM | POA: Diagnosis not present

## 2022-12-30 DIAGNOSIS — R809 Proteinuria, unspecified: Secondary | ICD-10-CM | POA: Diagnosis not present

## 2022-12-30 DIAGNOSIS — D631 Anemia in chronic kidney disease: Secondary | ICD-10-CM | POA: Diagnosis not present

## 2023-01-11 ENCOUNTER — Ambulatory Visit: Payer: PPO | Admitting: Internal Medicine

## 2023-01-11 ENCOUNTER — Other Ambulatory Visit: Payer: PPO

## 2023-01-13 ENCOUNTER — Other Ambulatory Visit: Payer: Self-pay | Admitting: Internal Medicine

## 2023-01-13 DIAGNOSIS — C50412 Malignant neoplasm of upper-outer quadrant of left female breast: Secondary | ICD-10-CM

## 2023-01-15 ENCOUNTER — Telehealth: Payer: Self-pay | Admitting: Pharmacist

## 2023-01-15 NOTE — Progress Notes (Signed)
01/15/2023  Patient ID: Bianca Shaw, female   DOB: February 15, 1946, 77 y.o.   MRN: [id number]  Pharmacy Quality Measure Review  This patient is appearing on a report for being at risk of failing the adherence measure for hypertension (ACEi/ARB) medications this calendar year.   Medication: Lisinopril 20mg  Last fill date: 06/16/22 for 90 day supply  Last filled on 11/18/22 for 90 day supply. Insurance report was not up to date. No action needed at this time.     Marlowe Aschoff, PharmD [facility] Phone Number: [phone]

## 2023-01-19 ENCOUNTER — Telehealth: Payer: Self-pay | Admitting: Pharmacist

## 2023-01-19 NOTE — Progress Notes (Signed)
01/19/2023  Patient ID: [name], female   DOB: December 18, 1945, 77 y.o.   MRN: [id number]  Pharmacy Quality Measure Review  This patient is appearing on a report for being at risk of failing the adherence measure for cholesterol (statin) medications this calendar year.   Medication: Simvastatin 40mg  Last fill date: 12/29/22 for 90 day supply  Insurance report was not up to date. No action needed at this time.     Marlowe Aschoff, PharmD [facility] Phone Number: [phone]

## 2023-01-22 DIAGNOSIS — Z0000 Encounter for general adult medical examination without abnormal findings: Secondary | ICD-10-CM | POA: Diagnosis not present

## 2023-01-22 DIAGNOSIS — J449 Chronic obstructive pulmonary disease, unspecified: Secondary | ICD-10-CM | POA: Diagnosis not present

## 2023-01-22 DIAGNOSIS — N1832 Chronic kidney disease, stage 3b: Secondary | ICD-10-CM | POA: Diagnosis not present

## 2023-01-22 DIAGNOSIS — M349 Systemic sclerosis, unspecified: Secondary | ICD-10-CM | POA: Diagnosis not present

## 2023-01-22 DIAGNOSIS — I25119 Atherosclerotic heart disease of native coronary artery with unspecified angina pectoris: Secondary | ICD-10-CM | POA: Diagnosis not present

## 2023-01-22 DIAGNOSIS — C50412 Malignant neoplasm of upper-outer quadrant of left female breast: Secondary | ICD-10-CM | POA: Diagnosis not present

## 2023-01-22 DIAGNOSIS — E871 Hypo-osmolality and hyponatremia: Secondary | ICD-10-CM | POA: Diagnosis not present

## 2023-01-22 DIAGNOSIS — E7800 Pure hypercholesterolemia, unspecified: Secondary | ICD-10-CM | POA: Diagnosis not present

## 2023-01-22 DIAGNOSIS — N2581 Secondary hyperparathyroidism of renal origin: Secondary | ICD-10-CM | POA: Diagnosis not present

## 2023-01-22 DIAGNOSIS — I10 Essential (primary) hypertension: Secondary | ICD-10-CM | POA: Diagnosis not present

## 2023-01-22 DIAGNOSIS — R7302 Impaired glucose tolerance (oral): Secondary | ICD-10-CM | POA: Diagnosis not present

## 2023-01-26 ENCOUNTER — Inpatient Hospital Stay: Payer: PPO | Attending: Internal Medicine

## 2023-01-26 ENCOUNTER — Inpatient Hospital Stay: Payer: PPO

## 2023-01-26 DIAGNOSIS — M8580 Other specified disorders of bone density and structure, unspecified site: Secondary | ICD-10-CM | POA: Diagnosis not present

## 2023-01-26 DIAGNOSIS — Z170 Estrogen receptor positive status [ER+]: Secondary | ICD-10-CM | POA: Diagnosis not present

## 2023-01-26 DIAGNOSIS — D539 Nutritional anemia, unspecified: Secondary | ICD-10-CM | POA: Diagnosis not present

## 2023-01-26 DIAGNOSIS — Z79810 Long term (current) use of selective estrogen receptor modulators (SERMs): Secondary | ICD-10-CM | POA: Diagnosis not present

## 2023-01-26 DIAGNOSIS — D649 Anemia, unspecified: Secondary | ICD-10-CM

## 2023-01-26 DIAGNOSIS — C50412 Malignant neoplasm of upper-outer quadrant of left female breast: Secondary | ICD-10-CM | POA: Insufficient documentation

## 2023-01-26 LAB — HEMOGLOBIN AND HEMATOCRIT (CANCER CENTER ONLY)
HCT: 31.9 % — ABNORMAL LOW (ref 36.0–46.0)
Hemoglobin: 10.5 g/dL — ABNORMAL LOW (ref 12.0–15.0)

## 2023-01-26 NOTE — Progress Notes (Signed)
Hgb is at 10.5; hold retacrit

## 2023-01-28 DIAGNOSIS — Z170 Estrogen receptor positive status [ER+]: Secondary | ICD-10-CM | POA: Diagnosis not present

## 2023-01-28 DIAGNOSIS — C50412 Malignant neoplasm of upper-outer quadrant of left female breast: Secondary | ICD-10-CM | POA: Diagnosis not present

## 2023-01-28 DIAGNOSIS — Z9012 Acquired absence of left breast and nipple: Secondary | ICD-10-CM | POA: Diagnosis not present

## 2023-01-28 DIAGNOSIS — Z4432 Encounter for fitting and adjustment of external left breast prosthesis: Secondary | ICD-10-CM | POA: Diagnosis not present

## 2023-02-08 DIAGNOSIS — N1832 Chronic kidney disease, stage 3b: Secondary | ICD-10-CM | POA: Diagnosis not present

## 2023-02-10 DIAGNOSIS — N1832 Chronic kidney disease, stage 3b: Secondary | ICD-10-CM | POA: Diagnosis not present

## 2023-02-10 DIAGNOSIS — I10 Essential (primary) hypertension: Secondary | ICD-10-CM | POA: Diagnosis not present

## 2023-02-10 DIAGNOSIS — J449 Chronic obstructive pulmonary disease, unspecified: Secondary | ICD-10-CM | POA: Diagnosis not present

## 2023-02-10 DIAGNOSIS — I25119 Atherosclerotic heart disease of native coronary artery with unspecified angina pectoris: Secondary | ICD-10-CM | POA: Diagnosis not present

## 2023-02-10 DIAGNOSIS — E7800 Pure hypercholesterolemia, unspecified: Secondary | ICD-10-CM | POA: Diagnosis not present

## 2023-02-16 ENCOUNTER — Telehealth: Payer: Self-pay | Admitting: Pharmacist

## 2023-02-16 NOTE — Progress Notes (Signed)
02/16/2023  Patient ID: Bianca Shaw, female   DOB: 02/18/1946, 77 y.o.   MRN: [id number]  Pharmacy Quality Measure Review  This patient is appearing on a report for being at risk of failing the adherence measure for hypertension (ACEi/ARB) medications this calendar year.   Medication: Lisinopril 20mg  Last fill date: 11/18/22 for 90 day supply  Other metric meds: Simvastatin 40mg : 12/29/22 for 90DS  Insurance report was not up to date. No action needed at this time.     Marlowe Aschoff, PharmD [facility] Phone Number: [phone]

## 2023-02-23 ENCOUNTER — Other Ambulatory Visit: Payer: PPO

## 2023-02-23 ENCOUNTER — Ambulatory Visit: Payer: PPO

## 2023-02-23 ENCOUNTER — Encounter: Payer: Self-pay | Admitting: Nurse Practitioner

## 2023-02-23 ENCOUNTER — Inpatient Hospital Stay: Payer: PPO

## 2023-02-23 ENCOUNTER — Inpatient Hospital Stay: Payer: PPO | Admitting: Nurse Practitioner

## 2023-02-23 ENCOUNTER — Ambulatory Visit: Payer: PPO | Admitting: Nurse Practitioner

## 2023-02-23 VITALS — BP 150/56 | HR 61 | Temp 98.6°F | Resp 20 | Wt 146.2 lb

## 2023-02-23 DIAGNOSIS — N1832 Chronic kidney disease, stage 3b: Secondary | ICD-10-CM

## 2023-02-23 DIAGNOSIS — D631 Anemia in chronic kidney disease: Secondary | ICD-10-CM

## 2023-02-23 DIAGNOSIS — N189 Chronic kidney disease, unspecified: Secondary | ICD-10-CM

## 2023-02-23 DIAGNOSIS — Z853 Personal history of malignant neoplasm of breast: Secondary | ICD-10-CM | POA: Diagnosis not present

## 2023-02-23 DIAGNOSIS — C50412 Malignant neoplasm of upper-outer quadrant of left female breast: Secondary | ICD-10-CM

## 2023-02-23 DIAGNOSIS — M8580 Other specified disorders of bone density and structure, unspecified site: Secondary | ICD-10-CM

## 2023-02-23 DIAGNOSIS — Z08 Encounter for follow-up examination after completed treatment for malignant neoplasm: Secondary | ICD-10-CM

## 2023-02-23 DIAGNOSIS — D649 Anemia, unspecified: Secondary | ICD-10-CM

## 2023-02-23 DIAGNOSIS — D539 Nutritional anemia, unspecified: Secondary | ICD-10-CM | POA: Diagnosis not present

## 2023-02-23 LAB — CBC WITH DIFFERENTIAL (CANCER CENTER ONLY)
Abs Immature Granulocytes: 0.01 10*3/uL (ref 0.00–0.07)
Basophils Absolute: 0 10*3/uL (ref 0.0–0.1)
Basophils Relative: 1 %
Eosinophils Absolute: 0.3 10*3/uL (ref 0.0–0.5)
Eosinophils Relative: 5 %
HCT: 31.3 % — ABNORMAL LOW (ref 36.0–46.0)
Hemoglobin: 10.2 g/dL — ABNORMAL LOW (ref 12.0–15.0)
Immature Granulocytes: 0 %
Lymphocytes Relative: 22 %
Lymphs Abs: 1.3 10*3/uL (ref 0.7–4.0)
MCH: 33.7 pg (ref 26.0–34.0)
MCHC: 32.6 g/dL (ref 30.0–36.0)
MCV: 103.3 fL — ABNORMAL HIGH (ref 80.0–100.0)
Monocytes Absolute: 0.7 10*3/uL (ref 0.1–1.0)
Monocytes Relative: 13 %
Neutro Abs: 3.4 10*3/uL (ref 1.7–7.7)
Neutrophils Relative %: 59 %
Platelet Count: 203 10*3/uL (ref 150–400)
RBC: 3.03 MIL/uL — ABNORMAL LOW (ref 3.87–5.11)
RDW: 13.3 % (ref 11.5–15.5)
WBC Count: 5.7 10*3/uL (ref 4.0–10.5)
nRBC: 0 % (ref 0.0–0.2)

## 2023-02-23 LAB — BASIC METABOLIC PANEL
Anion gap: 7 (ref 5–15)
BUN: 26 mg/dL — ABNORMAL HIGH (ref 8–23)
CO2: 24 mmol/L (ref 22–32)
Calcium: 9 mg/dL (ref 8.9–10.3)
Chloride: 105 mmol/L (ref 98–111)
Creatinine, Ser: 1.65 mg/dL — ABNORMAL HIGH (ref 0.44–1.00)
GFR, Estimated: 32 mL/min — ABNORMAL LOW (ref >60)
Glucose, Bld: 110 mg/dL — ABNORMAL HIGH (ref 70–99)
Potassium: 4 mmol/L (ref 3.5–5.1)
Sodium: 136 mmol/L (ref 135–145)

## 2023-02-23 LAB — IRON AND TIBC
Iron: 103 ug/dL (ref 28–170)
Saturation Ratios: 33 % — ABNORMAL HIGH (ref 10.4–31.8)
TIBC: 308 ug/dL (ref 250–450)
UIBC: 205 ug/dL

## 2023-02-23 LAB — FERRITIN: Ferritin: 157 ng/mL (ref 11–307)

## 2023-02-23 NOTE — Progress Notes (Signed)
Bianca Shaw CONSULT NOTE  Patient Care Team: Marina Goodell, MD as PCP - General (Family Medicine) Mariah Milling Tollie Pizza, MD as PCP - Cardiology (Cardiology) Lemar Livings, Merrily Pew, MD (General Surgery) Mady Haagensen, MD as Consulting Physician (Internal Medicine) Jesusita Oka, MD as Consulting Physician (Dermatology) Isla Pence, OD as Consulting Physician (Optometry) Mariah Milling, Tollie Pizza, MD as Consulting Physician (Cardiology) Tedd Sias Marlana Salvage, MD as Physician Assistant (Endocrinology) Duanne Guess, MD as Consulting Physician (General Surgery) Earna Coder, MD as Consulting Physician (Internal Medicine)  CHIEF COMPLAINTS/PURPOSE OF CONSULTATION: Breast cancer  Oncology History Overview Note  Bianca Shaw is a 77 y.o. female with stage IIA (T2N0) left breast cancer s/p mastectomy with sentinel lymph node biopsy on 12/26/2015.   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  Margins were negative.  Two sentinel lymph nodes were negative for macrometastasis, but with isolated tumor cells on IHC stains.  Three additional lymph nodes were positive for isolated tumor cells on IHC.  Tumor was ER positive (> 90%), PR positive (> 90%), and Her2/neu 2+ (eqivocal).  Her2/neu by FISH was negative.  Pathologic stage was pT2 pN0(i+).   MammaPrint testing revealed low risk luminal type A. There was a 97.8% probability of being disease free at 10 years with hormonal therapy.    Exam in 11/2016 revealed a 4 mm nodule in the left axillae above the mastectomy incision.  Excision biopsy on 12/15/2016 revealed fat necrosis and no malignancy.   Carcinoma of upper-outer quadrant of left breast in female, estrogen receptor positive (HCC)  12/06/2015 Initial Diagnosis   Carcinoma of upper-outer quadrant of left breast in female, estrogen receptor positive (HCC)     HISTORY OF PRESENTING ILLNESS: Walking independently. With  her husband.   [name] 77 y.o. female invasive lobular breast cancer T2N1 ER/PR positive HER2 negative on tamoxifen with macrocytic anemia, chronic kidney disease, who returns to clinic for follow up. Denies breast masses, skin changes, or new pain. No unintentional weight loss. Energy is stable. No new bone pain. Denies hot flashes, new pain. Her energy is somewhat improved today.    Review of Systems  Constitutional:  Positive for malaise/fatigue. Negative for chills, diaphoresis, fever and weight loss.  HENT:  Negative for nosebleeds and sore throat.   Eyes:  Negative for double vision.  Respiratory:  Negative for cough, hemoptysis, sputum production, shortness of breath and wheezing.   Cardiovascular:  Negative for chest pain, palpitations, orthopnea and leg swelling.  Gastrointestinal:  Negative for abdominal pain, blood in stool, constipation, diarrhea, heartburn, melena, nausea and vomiting.  Genitourinary:  Negative for dysuria, frequency and urgency.  Musculoskeletal:  Positive for joint pain. Negative for back pain.  Skin: Negative.  Negative for itching and rash.  Neurological:  Negative for dizziness, tingling, focal weakness, weakness and headaches.  Endo/Heme/Allergies:  Does not bruise/bleed easily.  Psychiatric/Behavioral:  Negative for depression. The patient is not nervous/anxious and does not have insomnia.      MEDICAL HISTORY:  Past Medical History:  Diagnosis Date   Anemia    Anxiety disorder    Breast cancer (HCC) 12/03/2015   lt breast   Breast cancer of upper-outer quadrant of left female breast (HCC) 11/2015   pT2 pN0(i+).;ER+; PR +, her 2 neu not overexpressed.  Mastectomy, SLN, Mammoprint: Low risk.    Cancer (HCC) 12/03/2015   left breast/ INVASIVE LOBULAR CARCINOMA.    Chronic kidney disease    STAGE  3   COPD (chronic obstructive pulmonary disease) (HCC)    MILD   Cough    lingering, mild, finished Prednisone and anitbiotic 11/03/15   Dyspnea     DOE   Family history of adverse reaction to anesthesia    sister - PONV   GERD (gastroesophageal reflux disease)    Hypertension    Myocardial infarction (HCC)    Osteopenia    Osteoporosis    Pericarditis    diagnonsed June, 2010, unclear etiology as of yer   Personal history of tobacco use, presenting hazards to health 10/31/2015   Scleroderma (HCC)    ONLY ON SKIN-MILD   UTI (lower urinary tract infection)    Wears dentures    full upper    SURGICAL HISTORY: Past Surgical History:  Procedure Laterality Date   BREAST BIOPSY Right 2012   core - neg   BREAST BIOPSY Left 12/03/2015   INVASIVE LOBULAR CARCINOMA.    CARDIAC CATHETERIZATION     CATARACT EXTRACTION W/ INTRAOCULAR LENS IMPLANT Right    CATARACT EXTRACTION W/PHACO Left 11/17/2018   Procedure: CATARACT EXTRACTION PHACO AND INTRAOCULAR LENS PLACEMENT;  Surgeon: Elliot Cousin, MD;  Location: [facility];  Service: Ophthalmology;  Laterality: Left;  [place] 00:55 CDE 11.04 Fluid Pack Lot # [id number]   COLONOSCOPY WITH PROPOFOL N/A 11/08/2015   Procedure: COLONOSCOPY WITH PROPOFOL;  Surgeon: Midge Minium, MD;  Location: [facility];  Service: Endoscopy;  Laterality: N/A;   CORONARY ANGIOPLASTY     ESOPHAGOGASTRODUODENOSCOPY (EGD) WITH PROPOFOL N/A 11/18/2017   Procedure: ESOPHAGOGASTRODUODENOSCOPY (EGD) WITH PROPOFOL;  Surgeon: Pasty Spillers, MD;  Location: [facility];  Service: Endoscopy;  Laterality: N/A;   ESOPHAGOGASTRODUODENOSCOPY (EGD) WITH PROPOFOL N/A 02/15/2018   Procedure: ESOPHAGOGASTRODUODENOSCOPY (EGD) WITH PROPOFOL;  Surgeon: Wyline Mood, MD;  Location: [facility];  Service: Gastroenterology;  Laterality: N/A;   ESOPHAGOGASTRODUODENOSCOPY (EGD) WITH PROPOFOL N/A 05/10/2018   Procedure: ESOPHAGOGASTRODUODENOSCOPY (EGD) WITH BIOPSIES;  Surgeon: Pasty Spillers, MD;  Location: [facility];  Service: Endoscopy;  Laterality: N/A;   EVACUATION BREAST HEMATOMA Left 01/14/2016    Procedure: EVACUATION HEMATOMA BREAST;  Surgeon: Earline Mayotte, MD;  Location: [facility];  Service: General;  Laterality: Left;   EYE SURGERY     MASTECTOMY Left 2017   complete mastectomy   MASTECTOMY W/ SENTINEL NODE BIOPSY Left 12/26/2015   Procedure: MASTECTOMY WITH SENTINEL LYMPH NODE BIOPSY;  Surgeon: Earline Mayotte, MD;  Location: [facility];  Service: General;  Laterality: Left;   RIGHT/LEFT HEART CATH AND CORONARY ANGIOGRAPHY N/A 07/22/2016   Procedure: Right/Left Heart Cath and Coronary Angiography;  Surgeon: Antonieta Iba, MD;  Location: [facility];  Service: Cardiovascular;  Laterality: N/A;   TUBAL LIGATION     VESICOVAGINAL FISTULA CLOSURE W/ TAH      SOCIAL HISTORY: Social History   Socioeconomic History   Marital status: Married    Spouse name: Not on file   Number of children: 2   Years of education: Not on file   Highest education level: Not on file  Occupational History   Not on file  Tobacco Use   Smoking status: Former    Current packs/day: 0.00    Average packs/day: 0.8 packs/day for 40.0 years (30.0 ttl pk-yrs)    Types: Cigarettes    Start date: 11/07/1963    Quit date: 11/07/2003    Years since quitting: 19.3   Smokeless tobacco: Never  Vaping Use   Vaping status: Never Used  Substance  and Sexual Activity   Alcohol use: Yes    Alcohol/week: 1.0 - 3.0 standard drink of alcohol    Types: 1 - 3 Glasses of wine per week    Comment: Occasionally.     Drug use: No   Sexual activity: Not on file  Other Topics Concern   Not on file  Social History Narrative   She drinks 1 to 2 caffeinated beverages a day   Social Determinants of Health   Financial Resource Strain: Not on file  Food Insecurity: No Food Insecurity (05/26/2022)   [facility]   Hunger Vital Sign    Worried About Running Out of Food in the Last Year: Never true    Ran Out of Food in the Last Year: Never true  Transportation Needs:  No Transportation Needs (05/26/2022)   [facility]   [facility] - Transportation    Lack of Transportation (Medical): No    Lack of Transportation (Non-Medical): No  Physical Activity: Not on file  Stress: Not on file  Social Connections: Not on file  Intimate Partner Violence: Not on file    FAMILY HISTORY: Family History  Problem Relation Age of Onset   Heart failure Mother    Epilepsy Mother    COPD Father    Heart disease Father    Anxiety disorder Sister    Arthritis Brother    Heart disease Brother    Vaginal cancer Paternal Grandmother    Heart attack Paternal Grandfather    COPD Brother    Kidney failure Brother    COPD Brother    Arthritis Sister    Uterine cancer Other    Diabetes Other    Colon cancer Neg Hx    Stomach cancer Neg Hx    Breast cancer Neg Hx     ALLERGIES:  is allergic to pimenta and tomato.  MEDICATIONS:  Current Outpatient Medications  Medication Sig Dispense Refill   amLODipine (NORVASC) 2.5 MG tablet Take 1 tablet (2.5 mg total) by mouth daily. 90 tablet 3   Cyanocobalamin (B-12) 5000 MCG CAPS Take 1,000 mcg by mouth daily.      ezetimibe (ZETIA) 10 MG tablet Take 1 tablet (10 mg total) by mouth daily. 30 tablet 8   Ferrous Sulfate (IRON PO) Take 65 mg of iron by mouth. Taking 1 tablet daily     fluticasone (FLONASE) 50 MCG/ACT nasal spray Place 2 sprays into both nostrils daily. 16 g 0   furosemide (LASIX) 20 MG tablet Take 1 tablet (20 mg total) by mouth daily. 90 tablet 3   GARLIC PO Take 1 tablet by mouth daily.     hydrALAZINE (APRESOLINE) 25 MG tablet Take 1 tablet (25 mg total) by mouth 2 (two) times daily. 180 tablet 3   KRILL OIL PO Take 1 capsule by mouth daily.     lisinopril (ZESTRIL) 20 MG tablet Take 2 tablets (40 mg total) by mouth daily. 180 tablet 3   metoprolol succinate (TOPROL-XL) 25 MG 24 hr tablet Take 1 tablet (25 mg total) by mouth daily. 90 tablet 3   ondansetron (ZOFRAN ODT) 4  MG disintegrating tablet Take 1 tablet (4 mg total) by mouth every 8 (eight) hours as needed. 20 tablet 1   pantoprazole (PROTONIX) 40 MG tablet Take by mouth.     potassium chloride SA (KLOR-CON M) 20 MEQ tablet Take 1 tablet (20 mEq total) by mouth daily. 90 tablet 1   simvastatin (  ZOCOR) 40 MG tablet Take 1 tablet (40 mg total) by mouth daily. 90 tablet 1   sodium chloride 1 g tablet Take 1 g by mouth daily.     tamoxifen (NOLVADEX) 20 MG tablet Take 1 tablet (20 mg total) by mouth daily. 90 tablet 0   albuterol (PROVENTIL) (2.5 MG/3ML) 0.083% nebulizer solution Take 3 mLs (2.5 mg total) by nebulization every 6 (six) hours as needed for wheezing or shortness of breath. (Patient not taking: Reported on 09/01/2022) 75 mL 12   nitroGLYCERIN (NITROSTAT) 0.4 MG SL tablet Place 1 tablet (0.4 mg total) under the tongue every 5 (five) minutes as needed for chest pain. (Patient not taking: Reported on 09/01/2022) 30 tablet 3   No current facility-administered medications for this visit.    PHYSICAL EXAMINATION: ECOG PERFORMANCE STATUS: 1 - Symptomatic but completely ambulatory  Vitals:   02/23/23 1446  BP: (!) 150/56  Pulse: 61  Resp: 20  Temp: 98.6 F (37 C)  SpO2: 100%   Filed Weights   02/23/23 1446  Weight: 146 lb 3.2 oz (66.3 kg)    Physical Exam Constitutional:      Appearance: She is not ill-appearing.  Eyes:     General: No scleral icterus.    Conjunctiva/sclera: Conjunctivae normal.  Cardiovascular:     Rate and Rhythm: Normal rate and regular rhythm.  Abdominal:     General: There is no distension.     Palpations: Abdomen is soft.     Tenderness: There is no abdominal tenderness. There is no guarding.  Musculoskeletal:        General: No deformity.     Right lower leg: No edema.     Left lower leg: No edema.  Lymphadenopathy:     Cervical: No cervical adenopathy.  Skin:    General: Skin is warm and dry.  Neurological:     Mental Status: She is alert and oriented to  person, place, and time. Mental status is at baseline.  Psychiatric:        Mood and Affect: Mood normal.        Behavior: Behavior normal.    LABORATORY DATA:  I have reviewed the data as listed Lab Results  Component Value Date   WBC 5.7 02/23/2023   HGB 10.2 (L) 02/23/2023   HCT 31.3 (L) 02/23/2023   MCV 103.3 (H) 02/23/2023   PLT 203 02/23/2023   Recent Labs    09/01/22 0825 10/06/22 1439 11/05/22 1248 12/01/22 1251 02/23/23 1417  NA 135 135 132* 131* 136  K 3.2* 4.2 4.1 4.1 4.0  CL 102 102 101 101 105  CO2 21* 23 21* 22 24  GLUCOSE 86 104* 114* 110* 110*  BUN 12 30* 23 27* 26*  CREATININE 1.33* 2.07* 1.37* 1.66* 1.65*  CALCIUM 8.6* 9.0 8.7* 8.6* 9.0  GFRNONAA 41* 24* 40* 32* 32*  PROT 7.1 7.8  --   --   --   ALBUMIN 3.7 3.7  --   --   --   AST 24 19  --   --   --   ALT 12 13  --   --   --   ALKPHOS 34* 36*  --   --   --   BILITOT 0.3 0.2*  --   --   --    Iron/TIBC/Ferritin/ %Sat    Component Value Date/Time   IRON 103 02/23/2023 1417   IRON 39 05/24/2017 1613   TIBC 308 02/23/2023 1417  FERRITIN 157 02/23/2023 1417   IRONPCTSAT 33 (H) 02/23/2023 1417     RADIOGRAPHIC STUDIES: I have personally reviewed the radiological images as listed and agreed with the findings in the report. No results found.  ASSESSMENT & PLAN:   # Moderate macrocytic anemia secondary ?CKD [s/p bone marrow biopsy-March 2019; Dr.C-nonspecific]; MAY 2024- b12- elevated.    # Today hemoglobin: 10.2. On retacrit & venofer. Hold retacrit today. On Gentle Iron orally. Last venofer 200 mg on 12/01/22. Last reatcrit 11/05/22. Continue monitoring.    # Stage IIA left breast cancer- Invasive lobular carcinoma s/p mastec; [Uterus-intact] 4.2 cm lesion; T2, N1 (iso).; ER/ PR positive; started tamoxifen October 2017 [? Ext Tam]; clinically no evidence of recurrence. August 2024 mammogram bi-rads 1: negative. Plan to repeat 11/2023. Continue tamoxifen for now [until 2027].    # Osteopenia  August 2021. On fosamax, calcium, vitamin D. Bone density 8/24 T score -2.0. Repeat bone density 12/03/22 was stable with t score -2.0. Continue calcium, vit d, and fosamax.     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  Stable.    #DISPOSITION: # HOLD Retacrit & Venofer today.   # In 4 weeks- labs H&H- possible Retacrit # in 8 weeks- labs H&H- possible Retacrit # Follow-up 12 weeks- labs (CBC BMP iron studies ferritin), Dr Lajean Saver Retacrit or Venofer- la  No problem-specific Assessment & Plan notes found for this encounter.  All questions were answered. The patient knows to call the clinic with any problems, questions or concerns.   Alinda Dooms, NP 02/23/2023

## 2023-02-24 DIAGNOSIS — N1832 Chronic kidney disease, stage 3b: Secondary | ICD-10-CM | POA: Diagnosis not present

## 2023-02-24 DIAGNOSIS — E871 Hypo-osmolality and hyponatremia: Secondary | ICD-10-CM | POA: Diagnosis not present

## 2023-02-26 ENCOUNTER — Telehealth: Payer: Self-pay | Admitting: Pharmacist

## 2023-02-26 NOTE — Progress Notes (Signed)
   02/26/2023  Patient ID: Bianca Shaw, female   DOB: 01-Aug-1945, 77 y.o.   MRN: [id number]  Tried calling the patient regarding delayed pick-up on Lisinopril from the pharmacy, as well as getting an updated BP reading due to elevation at last office visit. Left voicemail requesting a call back at her earliest convenience.  Of note, also appears that patient is NOT taking amlodipine since 2022- changed to "not taking" on her list.   Marlowe Aschoff, PharmD [facility] Phone Number: [phone]

## 2023-03-04 ENCOUNTER — Telehealth: Payer: Self-pay | Admitting: Pharmacist

## 2023-03-04 NOTE — Progress Notes (Signed)
   03/04/2023  Patient ID: Bianca Shaw, female   DOB: 1946/03/29, 77 y.o.   MRN: [id number]  Pharmacy Quality Measure Review  This patient is appearing on a report for being at risk of failing the adherence measure for hypertension (ACEi/ARB) medications this calendar year.   Medication: Lisinopril 20mg   Last fill date: 11/18/22 for 90 day supply  Reviewed medication indication, dosing, and goals of therapy.  Patient confirmed that her pill bottle was from 11/18/22 and she still had leftover medication. Reports taking 2 pills a day as prescribed and does not know why she has an oversupply.  Checked BP while on the phone as it was elevated at last PCP visit. Reading is 131/64 with HR of 58. Uses a wrist cuff but has also been sitting down talking to her sister on the phone for the past hour.    Marlowe Aschoff, PharmD [facility] Phone Number: [phone]

## 2023-03-09 DIAGNOSIS — H35033 Hypertensive retinopathy, bilateral: Secondary | ICD-10-CM | POA: Diagnosis not present

## 2023-03-09 DIAGNOSIS — H5213 Myopia, bilateral: Secondary | ICD-10-CM | POA: Diagnosis not present

## 2023-03-09 DIAGNOSIS — H35372 Puckering of macula, left eye: Secondary | ICD-10-CM | POA: Diagnosis not present

## 2023-03-23 ENCOUNTER — Inpatient Hospital Stay: Payer: PPO | Attending: Internal Medicine

## 2023-03-23 ENCOUNTER — Inpatient Hospital Stay: Payer: PPO

## 2023-03-23 VITALS — BP 162/73 | HR 58

## 2023-03-23 DIAGNOSIS — D539 Nutritional anemia, unspecified: Secondary | ICD-10-CM | POA: Diagnosis not present

## 2023-03-23 DIAGNOSIS — N189 Chronic kidney disease, unspecified: Secondary | ICD-10-CM

## 2023-03-23 DIAGNOSIS — C50412 Malignant neoplasm of upper-outer quadrant of left female breast: Secondary | ICD-10-CM | POA: Diagnosis not present

## 2023-03-23 DIAGNOSIS — M8580 Other specified disorders of bone density and structure, unspecified site: Secondary | ICD-10-CM | POA: Insufficient documentation

## 2023-03-23 DIAGNOSIS — Z79810 Long term (current) use of selective estrogen receptor modulators (SERMs): Secondary | ICD-10-CM | POA: Diagnosis not present

## 2023-03-23 DIAGNOSIS — Z170 Estrogen receptor positive status [ER+]: Secondary | ICD-10-CM | POA: Insufficient documentation

## 2023-03-23 LAB — HEMOGLOBIN AND HEMATOCRIT (CANCER CENTER ONLY)
HCT: 29.2 % — ABNORMAL LOW (ref 36.0–46.0)
Hemoglobin: 9.7 g/dL — ABNORMAL LOW (ref 12.0–15.0)

## 2023-03-23 MED ORDER — EPOETIN ALFA-EPBX 20000 UNIT/ML IJ SOLN
20000.0000 [IU] | Freq: Once | INTRAMUSCULAR | Status: AC
Start: 2023-03-23 — End: 2023-03-23
  Administered 2023-03-23: 20000 [IU] via SUBCUTANEOUS
  Filled 2023-03-23: qty 1

## 2023-03-30 ENCOUNTER — Ambulatory Visit: Payer: PPO | Admitting: Cardiology

## 2023-04-12 ENCOUNTER — Encounter: Payer: Self-pay | Admitting: Hematology and Oncology

## 2023-04-20 ENCOUNTER — Inpatient Hospital Stay: Payer: PPO | Attending: Internal Medicine

## 2023-04-20 ENCOUNTER — Other Ambulatory Visit: Payer: PPO

## 2023-04-20 ENCOUNTER — Inpatient Hospital Stay: Payer: PPO

## 2023-04-20 ENCOUNTER — Ambulatory Visit: Payer: PPO

## 2023-04-20 DIAGNOSIS — Z79899 Other long term (current) drug therapy: Secondary | ICD-10-CM | POA: Diagnosis not present

## 2023-04-20 DIAGNOSIS — D539 Nutritional anemia, unspecified: Secondary | ICD-10-CM | POA: Diagnosis not present

## 2023-04-20 DIAGNOSIS — Z170 Estrogen receptor positive status [ER+]: Secondary | ICD-10-CM | POA: Insufficient documentation

## 2023-04-20 DIAGNOSIS — D631 Anemia in chronic kidney disease: Secondary | ICD-10-CM

## 2023-04-20 DIAGNOSIS — C50412 Malignant neoplasm of upper-outer quadrant of left female breast: Secondary | ICD-10-CM | POA: Diagnosis not present

## 2023-04-20 LAB — HEMOGLOBIN AND HEMATOCRIT (CANCER CENTER ONLY)
HCT: 32.4 % — ABNORMAL LOW (ref 36.0–46.0)
Hemoglobin: 10.8 g/dL — ABNORMAL LOW (ref 12.0–15.0)

## 2023-04-20 NOTE — Progress Notes (Signed)
Patient didn't need the retacrit today, her hemoglobin was 10.8. So informed the patient that she was able to skip the injection today.

## 2023-04-30 ENCOUNTER — Ambulatory Visit: Payer: PPO | Attending: Cardiology | Admitting: Cardiology

## 2023-04-30 ENCOUNTER — Encounter: Payer: Self-pay | Admitting: Cardiology

## 2023-04-30 VITALS — BP 138/70 | HR 58 | Ht 62.0 in | Wt 147.8 lb

## 2023-04-30 DIAGNOSIS — I10 Essential (primary) hypertension: Secondary | ICD-10-CM | POA: Diagnosis not present

## 2023-04-30 DIAGNOSIS — I25110 Atherosclerotic heart disease of native coronary artery with unstable angina pectoris: Secondary | ICD-10-CM

## 2023-04-30 DIAGNOSIS — N1832 Chronic kidney disease, stage 3b: Secondary | ICD-10-CM

## 2023-04-30 DIAGNOSIS — R011 Cardiac murmur, unspecified: Secondary | ICD-10-CM

## 2023-04-30 DIAGNOSIS — E7800 Pure hypercholesterolemia, unspecified: Secondary | ICD-10-CM | POA: Diagnosis not present

## 2023-04-30 DIAGNOSIS — I214 Non-ST elevation (NSTEMI) myocardial infarction: Secondary | ICD-10-CM

## 2023-04-30 MED ORDER — EZETIMIBE 10 MG PO TABS: 10.0000 mg | ORAL_TABLET | Freq: Every day | ORAL | 3 refills | Status: AC

## 2023-04-30 MED ORDER — SIMVASTATIN 40 MG PO TABS: ORAL_TABLET | ORAL | 3 refills | Status: AC

## 2023-04-30 MED ORDER — POTASSIUM CHLORIDE CRYS ER 20 MEQ PO TBCR: 20.0000 meq | EXTENDED_RELEASE_TABLET | Freq: Every day | ORAL | 3 refills | Status: AC

## 2023-04-30 NOTE — Patient Instructions (Signed)
 Medication Instructions:  - No changes *If you need a refill on your cardiac medications before your next appointment, please call your pharmacy*  Lab Work: - None ordered  Testing/Procedures: Your physician has requested that you have an echocardiogram. Echocardiography is a painless test that uses sound waves to create images of your heart. It provides your doctor with information about the size and shape of your heart and how well your heart's chambers and valves are working. This procedure takes approximately one hour. There are no restrictions for this procedure. Please do NOT wear cologne, perfume, aftershave, or lotions (deodorant is allowed). Please arrive 15 minutes prior to your appointment time.  Please note: We ask at that you not bring children with you during ultrasound (echo/ vascular) testing. Due to room size and safety concerns, children are not allowed in the ultrasound rooms during exams. Our front office staff cannot provide observation of children in our lobby area while testing is being conducted. An adult accompanying a patient to their appointment will only be allowed in the ultrasound room at the discretion of the ultrasound technician under special circumstances. We apologize for any inconvenience.   Follow-Up: At [facility], you and your health needs are our priority.  As part of our continuing mission to provide you with exceptional heart care, we have created designated Provider Care Teams.  These Care Teams include your primary Cardiologist (physician) and Advanced Practice Providers (APPs -  Physician Assistants and Nurse Practitioners) who all work together to provide you with the care you need, when you need it.  Your next appointment:   1 year(s)  Provider:   You may see Timothy Gollan, MD or one of the following Advanced Practice Providers on your designated Care Team:   Lonni Meager, NP Bernardino Bring, PA-C Cadence Franchester, PA-C Tylene Lunch,  NP Barnie Hila, NP

## 2023-04-30 NOTE — Progress Notes (Signed)
 Cardiology Office Note:  .   Date:  04/30/2023  ID:  Bianca Shaw, DOB 1945/12/02, MRN [id number] PCP: Jeffie Cheryl BRAVO, MD  [facility] Providers Cardiologist:  Evalene Lunger, MD    History of Present Illness: .   Bianca Shaw is a 78 y.o. female with a past medical history of pericarditis, coronary artery disease with NSTEMI (07/15/2016), hypertension, hyponatremia, GERD, COPD, breast cancer, anxiety, anemia, smoking history, hypercholesterolemia, scleroderma, CKD stage III, who is here today for follow-up.   She was originally seen by Dr. Gollan in 2018 where previously been hospitalized and had elevated troponin in the setting of left-sided chest pain.  During her hospitalization she remained chest pain free for over 24 hours, but was still recovering from a GI bug.  It was decided that she would return for outpatient heart catheterization.  Cath was done 07/22/2016 which revealed no hemodynamically significant stenosis, normal right heart pressures, diffuse calcified plaque, predominantly in the LAD, normal LV function with ejection fraction greater than 55%.  Echocardiogram revealed LVEF 60-65%, normal wall motion, mild to moderate mitral regurgitation, the left atrium was mildly dilated.    She was last seen in clinic 01/16/2022 stating that she been doing fairly well.  She did continue to walk about a mile a day.  Also continued to be followed by the cancer center which is her vital signs versus every 70-month appointments that she has been upgraded to yearly appointments.  There were no medication changes that were made and no further testing that was needed at the time.  She returns to clinic today when she has been getting a cold for the last several weeks.  She states she took an at home COVID test that was negative.  She was not evaluated by PCP or urgent care to determine if it was clear.  She states she was having further fevers peaking at 101 and 102 primarily in the  evening with associated chills.  She states that she has had some occasional chest tightness pulling with coughing but nothing that was like chest pain or shortness of breath.  She denies any palpitations or peripheral edema.  States that she has been compliant with her current medication regimen.  Continues to monitor blood pressures and is getting 130-140.  She continues to have iron  infusions.  She is also followed up yearly by the cancer center for the previous diagnoses of breast cancer.  She denies any hospitalizations or visits to the emergency department.  ROS: 10 point review of systems were reviewed and considered negative except what is been listed in the HPI  Studies Reviewed: SABRA   EKG Interpretation Date/Time:  Friday April 30 2023 13:31:18 EST Ventricular Rate:  58 PR Interval:  190 QRS Duration:  76 QT Interval:  468 QTC Calculation: 459 R Axis:   -12  Text Interpretation: Sinus bradycardia Minimal voltage criteria for LVH, may be normal variant ( R in aVL ) Nonspecific ST abnormality When compared with ECG of 27-Jul-2018 09:07, PREVIOUS ECG IS PRESENT Confirmed by Bianca Shaw ([id number]) on 04/30/2023 1:39:42 PM    LHC 07/22/2016 The left ventricular ejection fraction is greater than 65% by visual estimate. The left ventricular systolic function is normal. LV end diastolic pressure is normal. There is no aortic valve stenosis. There is no mitral valve stenosis and no mitral valve prolapse evident. There is no aortic valve regurgitation. LV end diastolic pressure is normal.  2D echo 07/16/2016 Left ventricle: The cavity size  was normal. Systolic function was    normal. The estimated ejection fraction was in the range of 60%    to 65%. Wall motion was normal; there were no regional wall    motion abnormalities. Left ventricular diastolic function    parameters were normal.  - Mitral valve: There was mild to moderate regurgitation.  - Left atrium: The atrium was mildly  dilated.  - Right ventricle: Systolic function was normal.  - Pulmonary arteries: Systolic pressure was moderately elevated. PA    peak pressure: 53 mm Hg (S).   Risk Assessment/Calculations:             Physical Exam:   VS:  BP 138/70 (BP Location: Right Arm, Patient Position: Sitting, Cuff Size: Normal)   Pulse (!) 58   Ht 5' 2 (1.575 m)   Wt 147 lb 12.8 oz (67 kg)   SpO2 98%   BMI 27.03 kg/m    Wt Readings from Last 3 Encounters:  04/30/23 147 lb 12.8 oz (67 kg)  02/23/23 146 lb 3.2 oz (66.3 kg)  12/01/22 144 lb 14.4 oz (65.7 kg)    GEN: Well nourished, well developed in no acute distress NECK: No JVD; No carotid bruits CARDIAC: RRR, II/VI  systolic murmur RUSB without rubs or gallops RESPIRATORY:  Clear to auscultation without rales, wheezing or rhonchi  ABDOMEN: Soft, non-tender, non-distended EXTREMITIES:  No edema; No deformity   ASSESSMENT AND PLAN: .   Essential hypertension with blood pressure today 160/70 with recheck of 130/70.  Patient states she takes her blood pressure at home and her blood pressure has been running well.  She is continued on amlodipine  2.5 mg daily, furosemide  20 mg daily, hydralazine  25 mg twice daily, lisinopril  40 mg daily, Toprol -XL 25 mg daily.  She believes that her blood pressure is elevated today because of the instances related to her getting into the clinic.  She has been encouraged to continue to monitor blood pressures 1 to 2 hours postmedication ministration at home as well.  Coronary artery disease status post NSTEMI with no obstructive lesions found on cardiac catheterization in 06/2016.  She has been continued on ezetimibe  and and simvastatin .  EKG today reveals sinus bradycardia with LVH and left axis deviation with no acute ischemic changes noted.  Hypercholesterolemia with an LDL of 50 in 12/2022.  At she continues on simvastatin  40 mg daily and ezetimibe  10 mg daily.  This continues to be followed by PCP.  Heart murmur noted on  examination today there was a new finding.  She has been scheduled for an updated echocardiogram to evaluate for structural abnormalities.  History of breast cancer which she continues to follow with the cancer center.  CKD stage III with a last serum creatinine of 1.4.  Serum creatinine has remained stable.  She continues to be followed by nephrology.       Dispo: Patient return to clinic to see MD/APP in 1 year or sooner if needed for reevaluation of symptoms.  Echocardiogram scheduled and she understands that if there are abnormal findings sooner return appointment will be moved up.  Signed, Kendrell Lottman, NP

## 2023-05-17 ENCOUNTER — Other Ambulatory Visit: Payer: PPO

## 2023-05-18 ENCOUNTER — Ambulatory Visit: Payer: PPO | Attending: Cardiology

## 2023-05-18 ENCOUNTER — Inpatient Hospital Stay: Payer: PPO

## 2023-05-18 ENCOUNTER — Inpatient Hospital Stay: Payer: PPO | Attending: Internal Medicine

## 2023-05-18 ENCOUNTER — Inpatient Hospital Stay: Payer: PPO | Admitting: Internal Medicine

## 2023-05-18 ENCOUNTER — Encounter: Payer: Self-pay | Admitting: Internal Medicine

## 2023-05-18 VITALS — BP 128/51 | HR 63 | Temp 97.5°F | Ht 62.0 in | Wt 148.4 lb

## 2023-05-18 VITALS — BP 121/51 | HR 56

## 2023-05-18 DIAGNOSIS — Z79810 Long term (current) use of selective estrogen receptor modulators (SERMs): Secondary | ICD-10-CM | POA: Diagnosis not present

## 2023-05-18 DIAGNOSIS — E876 Hypokalemia: Secondary | ICD-10-CM | POA: Insufficient documentation

## 2023-05-18 DIAGNOSIS — Z170 Estrogen receptor positive status [ER+]: Secondary | ICD-10-CM | POA: Insufficient documentation

## 2023-05-18 DIAGNOSIS — I25110 Atherosclerotic heart disease of native coronary artery with unstable angina pectoris: Secondary | ICD-10-CM

## 2023-05-18 DIAGNOSIS — D631 Anemia in chronic kidney disease: Secondary | ICD-10-CM | POA: Diagnosis not present

## 2023-05-18 DIAGNOSIS — N189 Chronic kidney disease, unspecified: Secondary | ICD-10-CM

## 2023-05-18 DIAGNOSIS — Z1721 Progesterone receptor positive status: Secondary | ICD-10-CM | POA: Insufficient documentation

## 2023-05-18 DIAGNOSIS — Z9012 Acquired absence of left breast and nipple: Secondary | ICD-10-CM | POA: Diagnosis not present

## 2023-05-18 DIAGNOSIS — Z1731 Human epidermal growth factor receptor 2 positive status: Secondary | ICD-10-CM | POA: Diagnosis not present

## 2023-05-18 DIAGNOSIS — C50412 Malignant neoplasm of upper-outer quadrant of left female breast: Secondary | ICD-10-CM | POA: Diagnosis present

## 2023-05-18 DIAGNOSIS — R011 Cardiac murmur, unspecified: Secondary | ICD-10-CM | POA: Diagnosis not present

## 2023-05-18 DIAGNOSIS — D649 Anemia, unspecified: Secondary | ICD-10-CM | POA: Diagnosis not present

## 2023-05-18 DIAGNOSIS — Z87891 Personal history of nicotine dependence: Secondary | ICD-10-CM | POA: Diagnosis not present

## 2023-05-18 DIAGNOSIS — N183 Chronic kidney disease, stage 3 unspecified: Secondary | ICD-10-CM | POA: Insufficient documentation

## 2023-05-18 LAB — CBC WITH DIFFERENTIAL (CANCER CENTER ONLY)
Abs Immature Granulocytes: 0.02 10*3/uL (ref 0.00–0.07)
Basophils Absolute: 0.1 10*3/uL (ref 0.0–0.1)
Basophils Relative: 1 %
Eosinophils Absolute: 0.2 10*3/uL (ref 0.0–0.5)
Eosinophils Relative: 4 %
HCT: 31.9 % — ABNORMAL LOW (ref 36.0–46.0)
Hemoglobin: 10.6 g/dL — ABNORMAL LOW (ref 12.0–15.0)
Immature Granulocytes: 0 %
Lymphocytes Relative: 20 %
Lymphs Abs: 1.2 10*3/uL (ref 0.7–4.0)
MCH: 34.3 pg — ABNORMAL HIGH (ref 26.0–34.0)
MCHC: 33.2 g/dL (ref 30.0–36.0)
MCV: 103.2 fL — ABNORMAL HIGH (ref 80.0–100.0)
Monocytes Absolute: 0.6 10*3/uL (ref 0.1–1.0)
Monocytes Relative: 11 %
Neutro Abs: 3.8 10*3/uL (ref 1.7–7.7)
Neutrophils Relative %: 64 %
Platelet Count: 220 10*3/uL (ref 150–400)
RBC: 3.09 MIL/uL — ABNORMAL LOW (ref 3.87–5.11)
RDW: 13.2 % (ref 11.5–15.5)
WBC Count: 6 10*3/uL (ref 4.0–10.5)
nRBC: 0 % (ref 0.0–0.2)

## 2023-05-18 LAB — ECHOCARDIOGRAM COMPLETE
AR max vel: 2.01 cm2
AV Area VTI: 2.21 cm2
AV Area mean vel: 2.01 cm2
AV Mean grad: 10 mm[Hg]
AV Peak grad: 16.3 mm[Hg]
Ao pk vel: 2.02 m/s
Area-P 1/2: 3.17 cm2
S' Lateral: 3.2 cm

## 2023-05-18 LAB — BASIC METABOLIC PANEL - CANCER CENTER ONLY
Anion gap: 10 (ref 5–15)
BUN: 32 mg/dL — ABNORMAL HIGH (ref 8–23)
CO2: 22 mmol/L (ref 22–32)
Calcium: 9.1 mg/dL (ref 8.9–10.3)
Chloride: 101 mmol/L (ref 98–111)
Creatinine: 1.58 mg/dL — ABNORMAL HIGH (ref 0.44–1.00)
GFR, Estimated: 33 mL/min — ABNORMAL LOW (ref >60)
Glucose, Bld: 114 mg/dL — ABNORMAL HIGH (ref 70–99)
Potassium: 4.4 mmol/L (ref 3.5–5.1)
Sodium: 133 mmol/L — ABNORMAL LOW (ref 135–145)

## 2023-05-18 LAB — FERRITIN: Ferritin: 176 ng/mL (ref 11–307)

## 2023-05-18 LAB — IRON AND TIBC
Iron: 103 ug/dL (ref 28–170)
Saturation Ratios: 31 % (ref 10.4–31.8)
TIBC: 335 ug/dL (ref 250–450)
UIBC: 232 ug/dL

## 2023-05-18 MED ORDER — IRON SUCROSE 20 MG/ML IV SOLN
200.0000 mg | Freq: Once | INTRAVENOUS | Status: AC
  Administered 2023-05-18: 200 mg via INTRAVENOUS
  Filled 2023-05-18: qty 10

## 2023-05-18 NOTE — Progress Notes (Signed)
Heart squeeze was noted 60 to 65% and has normal function, there is no wall motion abnormalities noted, mild leakage was noted in the mitral and aortic valves. Over all a reassuring study.

## 2023-05-18 NOTE — Assessment & Plan Note (Addendum)
#   Moderate macrocytic anemia secondary ?CKD [s/p bone marrow biopsy-March 2019; dr.C-nonspecific]; MAY 2024- b12- elevated.   # Today hemoglobin: 10.6; on Retacrit/ Venofer. HOLD retacrit today. On Gentle iron. Proceed with venofer.   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 Ext Tam]; clinically no evidence of recurrence.  Continue tamoxifen for now [until 2027]. Stable. 2024- aug mammo-WNL>   # Osteopenia  AUG 2024- he BMD  T-score of -2.0.-continue Fosamax calcium plus vitamin D. 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  Stable.   #DISPOSITION: # HOLD Retacrit today; Venofer today.  # In 4 weeks- labs H&H- possible Retacrit # in 8 weeks- labs H&H- possible Retacrit # Follow-up 12 weeks-MD  labs CBC BMP; iron studies; ferritin-possible Retacrit or Venofer- - Dr.B

## 2023-05-18 NOTE — Progress Notes (Signed)
Had an EKG this morning at Dr. Ethelene Hal office.  Fatigue/weakness: yes Dyspena: no Light headedness: no Blood in stool: no

## 2023-05-18 NOTE — Progress Notes (Signed)
Pt tolerated treatment without concerns.  VSS.  Pt refused 30 minute post observation.  Pt understands risks.   

## 2023-05-18 NOTE — Progress Notes (Signed)
[facility] CONSULT NOTE  Patient Care Team: Marina Goodell, MD as PCP - General (Family Medicine) Mariah Milling Tollie Pizza, MD as PCP - Cardiology (Cardiology) Lemar Livings, Merrily Pew, MD (General Surgery) Mady Haagensen, MD as Consulting Physician (Internal Medicine) Jesusita Oka, MD as Consulting Physician (Dermatology) Isla Pence, OD as Consulting Physician (Optometry) Mariah Milling, Tollie Pizza, MD as Consulting Physician (Cardiology) Tedd Sias Marlana Salvage, MD as Physician Assistant (Endocrinology) Duanne Guess, MD as Consulting Physician (General Surgery) Earna Coder, MD as Consulting Physician (Internal Medicine)  CHIEF COMPLAINTS/PURPOSE OF CONSULTATION: Breast cancer   Oncology History Overview Note  Bianca Shaw is a 78 y.o. female with stage IIA (T2N0) left breast cancer s/p mastectomy with sentinel lymph node biopsy on 12/26/2015.   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  Margins were negative.  Two sentinel lymph nodes were negative for macrometastasis, but with isolated tumor cells on IHC stains.  Three additional lymph nodes were positive for isolated tumor cells on IHC.  Tumor was ER positive (> 90%), PR positive (> 90%), and Her2/neu 2+ (eqivocal).  Her2/neu by FISH was negative.  Pathologic stage was pT2 pN0(i+).   MammaPrint testing revealed low risk luminal type A. There was a 97.8% probability of being disease free at 10 years with hormonal therapy.    Exam in 11/2016 revealed a 4 mm nodule in the left axillae above the mastectomy incision.  Excision biopsy on 12/15/2016 revealed fat necrosis and no malignancy.   Carcinoma of upper-outer quadrant of left breast in female, estrogen receptor positive (HCC)  12/06/2015 Initial Diagnosis   Carcinoma of upper-outer quadrant of left breast in female, estrogen receptor positive (HCC)     HISTORY OF PRESENTING ILLNESS: Walking independently.   With her husband.   Bianca Shaw 78 y.o.  female invasive lobular breast cancer T2N1 ER/PR positive HER2 negative on tamoxifen; [name]  Notes to have improvement of the Energy levels.   Patient denies any unusual hot flashes or joint pains.  No unusual bone pain.  Review of Systems  Constitutional:  Positive for malaise/fatigue. Negative for chills, diaphoresis, fever and weight loss.  HENT:  Negative for nosebleeds and sore throat.   Eyes:  Negative for double vision.  Respiratory:  Negative for cough, hemoptysis, sputum production, shortness of breath and wheezing.   Cardiovascular:  Negative for chest pain, palpitations, orthopnea and leg swelling.  Gastrointestinal:  Negative for abdominal pain, blood in stool, constipation, diarrhea, heartburn, melena, nausea and vomiting.  Genitourinary:  Negative for dysuria, frequency and urgency.  Musculoskeletal:  Positive for joint pain. Negative for back pain.  Skin: Negative.  Negative for itching and rash.  Neurological:  Negative for dizziness, tingling, focal weakness, weakness and headaches.  Endo/Heme/Allergies:  Does not bruise/bleed easily.  Psychiatric/Behavioral:  Negative for depression. The patient is not nervous/anxious and does not have insomnia.      MEDICAL HISTORY:  Past Medical History:  Diagnosis Date   Anemia    Anxiety disorder    Breast cancer (HCC) 12/03/2015   lt breast   Breast cancer of upper-outer quadrant of left female breast (HCC) 11/2015   pT2 pN0(i+).;ER+; PR +, her 2 neu not overexpressed.  Mastectomy, SLN, Mammoprint: Low risk.    Cancer (HCC) 12/03/2015   left breast/ INVASIVE LOBULAR CARCINOMA.    Chronic kidney disease    STAGE 3   COPD (  chronic obstructive pulmonary disease) (HCC)    MILD   Cough    lingering, mild, finished Prednisone and anitbiotic 11/03/15   Dyspnea    DOE   Family history of adverse reaction to  anesthesia    sister - PONV   GERD (gastroesophageal reflux disease)    Hypertension    Myocardial infarction (HCC)    Osteopenia    Osteoporosis    Pericarditis    diagnonsed June, 2010, unclear etiology as of yer   Personal history of tobacco use, presenting hazards to health 10/31/2015   Scleroderma (HCC)    ONLY ON SKIN-MILD   UTI (lower urinary tract infection)    Wears dentures    full upper    SURGICAL HISTORY: Past Surgical History:  Procedure Laterality Date   BREAST BIOPSY Right 2012   core - neg   BREAST BIOPSY Left 12/03/2015   INVASIVE LOBULAR CARCINOMA.    CARDIAC CATHETERIZATION     CATARACT EXTRACTION W/ INTRAOCULAR LENS IMPLANT Right    CATARACT EXTRACTION W/PHACO Left 11/17/2018   Procedure: CATARACT EXTRACTION PHACO AND INTRAOCULAR LENS PLACEMENT;  Surgeon: Elliot Cousin, MD;  Location: [facility];  Service: Ophthalmology;  Laterality: Left;  [place] 00:55 CDE 11.04 Fluid Pack Lot # [id number]   COLONOSCOPY WITH PROPOFOL N/A 11/08/2015   Procedure: COLONOSCOPY WITH PROPOFOL;  Surgeon: Midge Minium, MD;  Location: [facility];  Service: Endoscopy;  Laterality: N/A;   CORONARY ANGIOPLASTY     ESOPHAGOGASTRODUODENOSCOPY (EGD) WITH PROPOFOL N/A 11/18/2017   Procedure: ESOPHAGOGASTRODUODENOSCOPY (EGD) WITH PROPOFOL;  Surgeon: Pasty Spillers, MD;  Location: [facility];  Service: Endoscopy;  Laterality: N/A;   ESOPHAGOGASTRODUODENOSCOPY (EGD) WITH PROPOFOL N/A 02/15/2018   Procedure: ESOPHAGOGASTRODUODENOSCOPY (EGD) WITH PROPOFOL;  Surgeon: Wyline Mood, MD;  Location: [facility] END[facility];  Service: Gastroenterology;  Laterality: N/A;   ESOPHAGOGASTRODUODENOSCOPY (EGD) WITH PROPOFOL N/A 05/10/2018   Procedure: ESOPHAGOGASTRODUODENOSCOPY (EGD) WITH BIOPSIES;  Surgeon: Pasty Spillers, MD;  Location: [facility];  Service: Endoscopy;  Laterality: N/A;   EVACUATION BREAST HEMATOMA Left 01/14/2016   Procedure: EVACUATION HEMATOMA BREAST;  Surgeon: Earline Mayotte, MD;  Location: [facility];  Service: General;  Laterality: Left;   EYE SURGERY     MASTECTOMY Left 2017   complete mastectomy   MASTECTOMY W/ SENTINEL NODE BIOPSY Left 12/26/2015   Procedure: MASTECTOMY WITH SENTINEL LYMPH NODE BIOPSY;  Surgeon: Earline Mayotte, MD;  Location: [facility];  Service: General;  Laterality: Left;   RIGHT/LEFT HEART CATH AND CORONARY ANGIOGRAPHY N/A 07/22/2016   Procedure: Right/Left Heart Cath and Coronary Angiography;  Surgeon: Antonieta Iba, MD;  Location: [facility] INV[facility] CV [facility];  Service: Cardiovascular;  Laterality: N/A;   TUBAL LIGATION     VESICOVAGINAL FISTULA CLOSURE W/ TAH      SOCIAL HISTORY: Social History   Socioeconomic History   Marital status: Married    Spouse name: Not on file   Number of children: 2   Years of education: Not on file   Highest education level: Not on file  Occupational History   Not on file  Tobacco Use   Smoking status: Former    Current packs/day: 0.00    Average packs/day: 0.8 packs/day for 40.0 years (30.0 ttl pk-yrs)    Types: Cigarettes    Start date: 11/07/1963    Quit date: 11/07/2003    Years since quitting: 19.5   Smokeless tobacco: Never  Vaping Use   Vaping status: Never Used  Substance and Sexual Activity  Alcohol use: Yes    Alcohol/week: 1.0 - 3.0 standard drink of alcohol    Types: 1 - 3 Glasses of wine per week    Comment: Occasionally.     Drug use: No   Sexual activity: Not on file  Other Topics Concern   Not on file  Social History Narrative   She drinks 1 to 2 caffeinated beverages a day   Social Drivers of Health   Financial Resource Strain: Not on file  Food Insecurity: No Food Insecurity (05/26/2022)   [facility]   Hunger Vital Sign    Worried About Running Out of Food in the Last Year: Never true    Ran Out of Food in the Last Year: Never true  Transportation Needs: No Transportation Needs (05/26/2022)   [facility]   [facility] - Transportation    Lack of Transportation (Medical): No    Lack of Transportation (Non-Medical): No  Physical Activity: Not on file  Stress: Not on file  Social Connections: Not on file  Intimate Partner Violence: Not on file    FAMILY HISTORY: Family History  Problem Relation Age of Onset   Heart failure Mother    Epilepsy Mother    COPD Father    Heart disease Father    Anxiety disorder Sister    Arthritis Brother    Heart disease Brother    Vaginal cancer Paternal Grandmother    Heart attack Paternal Grandfather    COPD Brother    Kidney failure Brother    COPD Brother    Arthritis Sister    Uterine cancer Other    Diabetes Other    Colon cancer Neg Hx    Stomach cancer Neg Hx    Breast cancer Neg Hx     ALLERGIES:  is allergic to pimenta and tomato.  MEDICATIONS:  Current Outpatient Medications  Medication Sig Dispense Refill   albuterol (PROVENTIL) (2.5 MG/3ML) 0.083% nebulizer solution Take 3 mLs (2.5 mg total) by nebulization every 6 (six) hours as needed for wheezing or shortness of breath. 75 mL 12   amLODipine (NORVASC) 2.5 MG tablet Take 1 tablet (2.5 mg total) by mouth daily. 90 tablet 3   Cyanocobalamin (B-12) 5000 MCG CAPS Take 1,000 mcg by mouth daily.      ezetimibe (ZETIA) 10 MG tablet Take 1 tablet (10 mg total) by mouth daily. 90 tablet 3   Ferrous Sulfate (IRON PO) Take 65 mg of iron by mouth. Taking 1 tablet daily     fluticasone (FLONASE) 50 MCG/ACT nasal spray Place 2 sprays into both nostrils daily. 16 g 0   furosemide (LASIX) 20 MG tablet Take 1 tablet (20 mg total) by mouth daily. 90 tablet 3   hydrALAZINE (APRESOLINE) 25 MG tablet Take 1 tablet (25 mg total) by mouth 2 (two) times daily. 180 tablet 3   lisinopril (ZESTRIL) 20 MG tablet Take 2 tablets (40 mg total) by mouth daily. 180 tablet 3   metoprolol succinate (TOPROL-XL) 25 MG 24 hr tablet Take 1 tablet (25 mg total) by mouth daily. 90 tablet 3    nitroGLYCERIN (NITROSTAT) 0.4 MG SL tablet Place 1 tablet (0.4 mg total) under the tongue every 5 (five) minutes as needed for chest pain. 30 tablet 3   pantoprazole (PROTONIX) 40 MG tablet Take by mouth.     potassium chloride SA (KLOR-CON M) 20 MEQ tablet Take 1 tablet (20 mEq total) by mouth daily. 90  tablet 3   simvastatin (ZOCOR) 40 MG tablet Take 1 tablet (40 mg total) by mouth daily. 90 tablet 3   sodium chloride 1 g tablet Take 1 g by mouth daily.     tamoxifen (NOLVADEX) 20 MG tablet Take 1 tablet (20 mg total) by mouth daily. 90 tablet 0   GARLIC PO Take 1 tablet by mouth daily. (Patient not taking: Reported on 04/30/2023)     KRILL OIL PO Take 1 capsule by mouth daily. (Patient not taking: Reported on 04/30/2023)     ondansetron (ZOFRAN ODT) 4 MG disintegrating tablet Take 1 tablet (4 mg total) by mouth every 8 (eight) hours as needed. (Patient not taking: Reported on 05/18/2023) 20 tablet 1   No current facility-administered medications for this visit.      Marland Kitchen  PHYSICAL EXAMINATION: ECOG PERFORMANCE STATUS: 1 - Symptomatic but completely ambulatory  Vitals:   05/18/23 1455  BP: (!) 128/51  Pulse: 63  Temp: (!) 97.5 F (36.4 C)  SpO2: 100%   Filed Weights   05/18/23 1455  Weight: 148 lb 6.4 oz (67.3 kg)    Physical Exam Vitals and nursing note reviewed.  Constitutional:      Comments:      HENT:     Head: Normocephalic and atraumatic.     Mouth/Throat:     Pharynx: Oropharynx is clear.  Eyes:     Extraocular Movements: Extraocular movements intact.     Pupils: Pupils are equal, round, and reactive to light.  Cardiovascular:     Rate and Rhythm: Normal rate and regular rhythm.  Pulmonary:     Comments: Decreased breath sounds bilaterally.  Abdominal:     Palpations: Abdomen is soft.  Musculoskeletal:        General: Normal range of motion.     Cervical back: Normal range of motion.  Skin:    General: Skin is warm.  Neurological:     General: No focal  deficit present.     Mental Status: She is alert and oriented to person, place, and time.  Psychiatric:        Behavior: Behavior normal.        Judgment: Judgment normal.      LABORATORY DATA:  I have reviewed the data as listed Lab Results  Component Value Date   WBC 6.0 05/18/2023   HGB 10.6 (L) 05/18/2023   HCT 31.9 (L) 05/18/2023   MCV 103.2 (H) 05/18/2023   PLT 220 05/18/2023   Recent Labs    09/01/22 0825 10/06/22 1439 11/05/22 1248 12/01/22 1251 02/23/23 1417 05/18/23 1447  NA 135 135   < > 131* 136 133*  K 3.2* 4.2   < > 4.1 4.0 4.4  CL 102 102   < > 101 105 101  CO2 21* 23   < > 22 24 22   GLUCOSE 86 104*   < > 110* 110* 114*  BUN 12 30*   < > 27* 26* 32*  CREATININE 1.33* 2.07*   < > 1.66* 1.65* 1.58*  CALCIUM 8.6* 9.0   < > 8.6* 9.0 9.1  GFRNONAA 41* 24*   < > 32* 32* 33*  PROT 7.1 7.8  --   --   --   --   ALBUMIN 3.7 3.7  --   --   --   --   AST 24 19  --   --   --   --   ALT 12 13  --   --   --   --  ALKPHOS 34* 36*  --   --   --   --   BILITOT 0.3 0.2*  --   --   --   --    < > = values in this interval not displayed.    RADIOGRAPHIC STUDIES: I have personally reviewed the radiological images as listed and agreed with the findings in the report. ECHOCARDIOGRAM COMPLETE Result Date: 05/18/2023    ECHOCARDIOGRAM REPORT   Patient Name:   KISSEY CHOU Date of Exam: 05/18/2023 Medical Rec #:  [id number]         Height:       62.0 in Accession #:    [id number]        Weight:       147.8 lb Date of Birth:  03/13/1946        BSA:          1.681 m Patient Age:    56 years          BP:           138/70 mmHg Patient Gender: F                 HR:           54 bpm. Exam Location:  [facility] Procedure: 2D Echo, Cardiac Doppler and Color Doppler Indications:    R01.1 Murmur  History:        Patient has no prior history of Echocardiogram examinations. CAD                 and Previous Myocardial Infarction, Abnormal ECG, COPD,                 Signs/Symptoms:Murmur,  Chest Pain and Shortness of Breath; Risk                 Factors:Dyslipidemia and Former Smoker.  Sonographer:    Ilda Mori MHA, BS, RDCS Referring Phys: [id number] SHERI HAMMOCK  Sonographer Comments: Technically challenging study due to limited acoustic windows. Image acquisition challenging due to mastectomy. IMPRESSIONS  1. Left ventricular ejection fraction, by estimation, is 60 to 65%. The left ventricle has normal function. The left ventricle has no regional wall motion abnormalities. Left ventricular diastolic parameters were normal.  2. Right ventricular systolic function is normal. The right ventricular size is normal. Tricuspid regurgitation signal is inadequate for assessing PA pressure.  3. The mitral valve is normal in structure. Mild mitral valve regurgitation. No evidence of mitral stenosis.  4. The aortic valve is normal in structure. There is mild calcification of the aortic valve. Aortic valve regurgitation is mild. Aortic valve sclerosis is present, with no evidence of aortic valve stenosis.  5. The inferior vena cava is normal in size with greater than 50% respiratory variability, suggesting right atrial pressure of 3 mmHg. FINDINGS  Left Ventricle: Left ventricular ejection fraction, by estimation, is 60 to 65%. The left ventricle has normal function. The left ventricle has no regional wall motion abnormalities. The left ventricular internal cavity size was normal in size. There is  no left ventricular hypertrophy. Left ventricular diastolic parameters were normal. Right Ventricle: The right ventricular size is normal. No increase in right ventricular wall thickness. Right ventricular systolic function is normal. Tricuspid regurgitation signal is inadequate for assessing PA pressure. Left Atrium: Left atrial size was normal in size. Right Atrium: Right atrial size was normal in size. Pericardium: There is no evidence of pericardial effusion. Mitral Valve: The mitral valve is normal in  structure. There is mild calcification of the mitral valve leaflet(s). Mild mitral annular calcification. Mild mitral valve regurgitation. No evidence of mitral valve stenosis. Tricuspid Valve: The tricuspid valve is normal in structure. Tricuspid valve regurgitation is mild . No evidence of tricuspid stenosis. Aortic Valve: The aortic valve is normal in structure. There is mild calcification of the aortic valve. Aortic valve regurgitation is mild. Aortic valve sclerosis is present, with no evidence of aortic valve stenosis. Aortic valve mean gradient measures 10.0 mmHg. Aortic valve peak gradient measures 16.3 mmHg. Aortic valve area, by VTI measures 2.21 cm. Pulmonic Valve: The pulmonic valve was normal in structure. Pulmonic valve regurgitation is mild. No evidence of pulmonic stenosis. Aorta: The aortic root is normal in size and structure. Venous: The inferior vena cava is normal in size with greater than 50% respiratory variability, suggesting right atrial pressure of 3 mmHg. IAS/Shunts: No atrial level shunt detected by color flow Doppler.  LEFT VENTRICLE PLAX 2D LVIDd:         4.20 cm   Diastology LVIDs:         3.20 cm   LV e' medial:    8.27 cm/s LV PW:         0.90 cm   LV E/e' medial:  12.5 LV IVS:        0.90 cm   LV e' lateral:   12.40 cm/s LVOT diam:     2.00 cm   LV E/e' lateral: 8.3 LV SV:         104 LV SV Index:   62 LVOT Area:     3.14 cm  RIGHT VENTRICLE RV Basal diam:  3.00 cm RV S prime:     9.20 cm/s TAPSE (M-mode): 2.4 cm LEFT ATRIUM             Index        RIGHT ATRIUM           Index LA diam:        3.80 cm 2.26 cm/m   RA Area:     21.90 cm LA Vol (A2C):   88.2 ml 52.47 ml/m  RA Volume:   62.10 ml  36.94 ml/m LA Vol (A4C):   63.9 ml 38.01 ml/m LA Biplane Vol: 83.0 ml 49.37 ml/m  AORTIC VALVE AV Area (Vmax):    2.01 cm AV Area (Vmean):   2.01 cm AV Area (VTI):     2.21 cm AV Vmax:           202.00 cm/s AV Vmean:          145.000 cm/s AV VTI:            0.471 m AV Peak Grad:       16.3 mmHg AV Mean Grad:      10.0 mmHg LVOT Vmax:         129.00 cm/s LVOT Vmean:        92.700 cm/s LVOT VTI:          0.332 m LVOT/AV VTI ratio: 0.70  AORTA Ao Root diam: 3.00 cm Ao Asc diam:  3.30 cm MITRAL VALVE MV Area (PHT): 3.17 cm     SHUNTS MV Decel Time: 239 msec     Systemic VTI:  0.33 m MV E velocity: 103.00 cm/s  Systemic Diam: 2.00 cm MV A velocity: 73.40 cm/s MV E/A ratio:  1.40 Julien Nordmann MD Electronically signed by Julien Nordmann MD Signature Date/Time: 05/18/2023/2:52:35 PM    Final  ASSESSMENT & PLAN:   Symptomatic anemia # Moderate macrocytic anemia secondary ?CKD [s/p bone marrow biopsy-March 2019; dr.C-nonspecific]; MAY 2024- b12- elevated.   # Today hemoglobin: 10; on Retacrit/ Venofer. HOLD retacrit today. On Gentle iron. Proceed with venofer.   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 Ext Tam]; clinically no evidence of recurrence.  Continue tamoxifen for now [until 2027]. Stable. 2024- aug mammo-WNL>   # Osteopenia  AUG 2024- he BMD  T-score of -2.0.-continue Fosamax calcium plus vitamin D. 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  Stable.   #DISPOSITION: # HOLD Retacrit today; Venofer today.  # In 4 weeks- labs H&H- possible Retacrit # in 8 weeks- labs H&H- possible Retacrit # Follow-up 12 weeks-MD  labs CBC BMP; iron studies; ferritin-possible Retacrit or Venofer- - Dr.B   All questions were answered. The patient knows to call the clinic with any problems, questions or concerns.    Earna Coder, MD 05/18/2023 3:39 PM

## 2023-05-18 NOTE — Patient Instructions (Signed)
Iron Sucrose Injection What is this medication? IRON SUCROSE (EYE ern SOO krose) treats low levels of iron (iron deficiency anemia) in people with kidney disease. Iron is a mineral that plays an important role in making red blood cells, which carry oxygen from your lungs to the rest of your body. This medicine may be used for other purposes; ask your health care provider or pharmacist if you have questions. COMMON BRAND NAME(S): Venofer What should I tell my care team before I take this medication? They need to know if you have any of these conditions: Anemia not caused by low iron levels Heart disease High levels of iron in the blood Kidney disease Liver disease An unusual or allergic reaction to iron, other medications, foods, dyes, or preservatives Pregnant or trying to get pregnant Breastfeeding How should I use this medication? This medication is for infusion into a vein. It is given in a hospital or clinic setting. Talk to your care team about the use of this medication in children. While this medication may be prescribed for children as young as 2 years for selected conditions, precautions do apply. Overdosage: If you think you have taken too much of this medicine contact a poison control center or emergency room at once. NOTE: This medicine is only for you. Do not share this medicine with others. What if I miss a dose? Keep appointments for follow-up doses. It is important not to miss your dose. Call your care team if you are unable to keep an appointment. What may interact with this medication? Do not take this medication with any of the following: Deferoxamine Dimercaprol Other iron products This medication may also interact with the following: Chloramphenicol Deferasirox This list may not describe all possible interactions. Give your health care provider a list of all the medicines, herbs, non-prescription drugs, or dietary supplements you use. Also tell them if you smoke,  drink alcohol, or use illegal drugs. Some items may interact with your medicine. What should I watch for while using this medication? Visit your care team regularly. Tell your care team if your symptoms do not start to get better or if they get worse. You may need blood work done while you are taking this medication. You may need to follow a special diet. Talk to your care team. Foods that contain iron include: whole grains/cereals, dried fruits, beans, or peas, leafy green vegetables, and organ meats (liver, kidney). What side effects may I notice from receiving this medication? Side effects that you should report to your care team as soon as possible: Allergic reactions--skin rash, itching, hives, swelling of the face, lips, tongue, or throat Low blood pressure--dizziness, feeling faint or lightheaded, blurry vision Shortness of breath Side effects that usually do not require medical attention (report to your care team if they continue or are bothersome): Flushing Headache Joint pain Muscle pain Nausea Pain, redness, or irritation at injection site This list may not describe all possible side effects. Call your doctor for medical advice about side effects. You may report side effects to FDA at 1-800-FDA-1088. Where should I keep my medication? This medication is given in a hospital or clinic. It will not be stored at home. NOTE: This sheet is a summary. It may not cover all possible information. If you have questions about this medicine, talk to your doctor, pharmacist, or health care provider.  2024 Elsevier/Gold Standard (2022-09-18 00:00:00)

## 2023-05-27 ENCOUNTER — Other Ambulatory Visit: Payer: Self-pay | Admitting: Internal Medicine

## 2023-05-27 DIAGNOSIS — C50412 Malignant neoplasm of upper-outer quadrant of left female breast: Secondary | ICD-10-CM

## 2023-06-15 ENCOUNTER — Inpatient Hospital Stay: Payer: PPO

## 2023-06-15 ENCOUNTER — Inpatient Hospital Stay: Payer: PPO | Attending: Internal Medicine

## 2023-06-15 DIAGNOSIS — Z853 Personal history of malignant neoplasm of breast: Secondary | ICD-10-CM | POA: Insufficient documentation

## 2023-06-15 DIAGNOSIS — D631 Anemia in chronic kidney disease: Secondary | ICD-10-CM | POA: Insufficient documentation

## 2023-06-15 DIAGNOSIS — N189 Chronic kidney disease, unspecified: Secondary | ICD-10-CM | POA: Insufficient documentation

## 2023-06-15 DIAGNOSIS — D649 Anemia, unspecified: Secondary | ICD-10-CM

## 2023-06-15 LAB — HEMOGLOBIN AND HEMATOCRIT (CANCER CENTER ONLY)
HCT: 30.9 % — ABNORMAL LOW (ref 36.0–46.0)
Hemoglobin: 10.3 g/dL — ABNORMAL LOW (ref 12.0–15.0)

## 2023-06-15 NOTE — Progress Notes (Signed)
Patient didn't need her Retacrit injection today, her hgb was 10.3.

## 2023-07-05 DIAGNOSIS — N1832 Chronic kidney disease, stage 3b: Secondary | ICD-10-CM | POA: Diagnosis not present

## 2023-07-05 DIAGNOSIS — I10 Essential (primary) hypertension: Secondary | ICD-10-CM | POA: Diagnosis not present

## 2023-07-13 ENCOUNTER — Inpatient Hospital Stay: Payer: PPO | Attending: Internal Medicine

## 2023-07-13 ENCOUNTER — Inpatient Hospital Stay: Payer: PPO

## 2023-07-13 DIAGNOSIS — Z170 Estrogen receptor positive status [ER+]: Secondary | ICD-10-CM | POA: Diagnosis not present

## 2023-07-13 DIAGNOSIS — C50412 Malignant neoplasm of upper-outer quadrant of left female breast: Secondary | ICD-10-CM | POA: Diagnosis not present

## 2023-07-13 DIAGNOSIS — Z79810 Long term (current) use of selective estrogen receptor modulators (SERMs): Secondary | ICD-10-CM | POA: Insufficient documentation

## 2023-07-13 DIAGNOSIS — D649 Anemia, unspecified: Secondary | ICD-10-CM | POA: Insufficient documentation

## 2023-07-13 LAB — HEMOGLOBIN AND HEMATOCRIT (CANCER CENTER ONLY)
HCT: 31.4 % — ABNORMAL LOW (ref 36.0–46.0)
Hemoglobin: 10.4 g/dL — ABNORMAL LOW (ref 12.0–15.0)

## 2023-07-13 NOTE — Progress Notes (Signed)
 Hgb is 10.4 today will hold retacrit

## 2023-07-26 DIAGNOSIS — M349 Systemic sclerosis, unspecified: Secondary | ICD-10-CM | POA: Diagnosis not present

## 2023-07-26 DIAGNOSIS — I10 Essential (primary) hypertension: Secondary | ICD-10-CM | POA: Diagnosis not present

## 2023-07-26 DIAGNOSIS — R7302 Impaired glucose tolerance (oral): Secondary | ICD-10-CM | POA: Diagnosis not present

## 2023-07-26 DIAGNOSIS — C50412 Malignant neoplasm of upper-outer quadrant of left female breast: Secondary | ICD-10-CM | POA: Diagnosis not present

## 2023-07-26 DIAGNOSIS — I25119 Atherosclerotic heart disease of native coronary artery with unspecified angina pectoris: Secondary | ICD-10-CM | POA: Diagnosis not present

## 2023-07-26 DIAGNOSIS — N2581 Secondary hyperparathyroidism of renal origin: Secondary | ICD-10-CM | POA: Diagnosis not present

## 2023-07-26 DIAGNOSIS — N1832 Chronic kidney disease, stage 3b: Secondary | ICD-10-CM | POA: Diagnosis not present

## 2023-07-26 DIAGNOSIS — J449 Chronic obstructive pulmonary disease, unspecified: Secondary | ICD-10-CM | POA: Diagnosis not present

## 2023-07-26 DIAGNOSIS — E7800 Pure hypercholesterolemia, unspecified: Secondary | ICD-10-CM | POA: Diagnosis not present

## 2023-07-26 DIAGNOSIS — E871 Hypo-osmolality and hyponatremia: Secondary | ICD-10-CM | POA: Diagnosis not present

## 2023-08-10 ENCOUNTER — Inpatient Hospital Stay: Payer: PPO | Admitting: Internal Medicine

## 2023-08-10 ENCOUNTER — Inpatient Hospital Stay: Payer: PPO | Attending: Internal Medicine

## 2023-08-10 ENCOUNTER — Inpatient Hospital Stay: Payer: PPO

## 2023-08-10 DIAGNOSIS — D649 Anemia, unspecified: Secondary | ICD-10-CM

## 2023-08-10 DIAGNOSIS — N1832 Chronic kidney disease, stage 3b: Secondary | ICD-10-CM | POA: Insufficient documentation

## 2023-08-10 DIAGNOSIS — Z170 Estrogen receptor positive status [ER+]: Secondary | ICD-10-CM | POA: Diagnosis not present

## 2023-08-10 DIAGNOSIS — D631 Anemia in chronic kidney disease: Secondary | ICD-10-CM | POA: Diagnosis not present

## 2023-08-10 DIAGNOSIS — Z79810 Long term (current) use of selective estrogen receptor modulators (SERMs): Secondary | ICD-10-CM | POA: Insufficient documentation

## 2023-08-10 DIAGNOSIS — C50412 Malignant neoplasm of upper-outer quadrant of left female breast: Secondary | ICD-10-CM | POA: Diagnosis not present

## 2023-08-10 DIAGNOSIS — Z79899 Other long term (current) drug therapy: Secondary | ICD-10-CM | POA: Insufficient documentation

## 2023-08-10 DIAGNOSIS — Z1732 Human epidermal growth factor receptor 2 negative status: Secondary | ICD-10-CM | POA: Diagnosis not present

## 2023-08-10 DIAGNOSIS — Z1731 Human epidermal growth factor receptor 2 positive status: Secondary | ICD-10-CM | POA: Diagnosis not present

## 2023-08-10 LAB — BASIC METABOLIC PANEL WITH GFR
Anion gap: 8 (ref 5–15)
BUN: 26 mg/dL — ABNORMAL HIGH (ref 8–23)
CO2: 23 mmol/L (ref 22–32)
Calcium: 8.7 mg/dL — ABNORMAL LOW (ref 8.9–10.3)
Chloride: 101 mmol/L (ref 98–111)
Creatinine, Ser: 1.43 mg/dL — ABNORMAL HIGH (ref 0.44–1.00)
GFR, Estimated: 38 mL/min — ABNORMAL LOW (ref >60)
Glucose, Bld: 106 mg/dL — ABNORMAL HIGH (ref 70–99)
Potassium: 4 mmol/L (ref 3.5–5.1)
Sodium: 132 mmol/L — ABNORMAL LOW (ref 135–145)

## 2023-08-10 LAB — IRON AND TIBC
Iron: 99 ug/dL (ref 28–170)
Saturation Ratios: 32 % — ABNORMAL HIGH (ref 10.4–31.8)
TIBC: 311 ug/dL (ref 250–450)
UIBC: 212 ug/dL

## 2023-08-10 LAB — CBC WITH DIFFERENTIAL (CANCER CENTER ONLY)
Abs Immature Granulocytes: 0.01 10*3/uL (ref 0.00–0.07)
Basophils Absolute: 0.1 10*3/uL (ref 0.0–0.1)
Basophils Relative: 1 %
Eosinophils Absolute: 0.2 10*3/uL (ref 0.0–0.5)
Eosinophils Relative: 4 %
HCT: 27.2 % — ABNORMAL LOW (ref 36.0–46.0)
Hemoglobin: 9 g/dL — ABNORMAL LOW (ref 12.0–15.0)
Immature Granulocytes: 0 %
Lymphocytes Relative: 17 %
Lymphs Abs: 0.9 10*3/uL (ref 0.7–4.0)
MCH: 34.6 pg — ABNORMAL HIGH (ref 26.0–34.0)
MCHC: 33.1 g/dL (ref 30.0–36.0)
MCV: 104.6 fL — ABNORMAL HIGH (ref 80.0–100.0)
Monocytes Absolute: 0.7 10*3/uL (ref 0.1–1.0)
Monocytes Relative: 14 %
Neutro Abs: 3.5 10*3/uL (ref 1.7–7.7)
Neutrophils Relative %: 64 %
Platelet Count: 190 10*3/uL (ref 150–400)
RBC: 2.6 MIL/uL — ABNORMAL LOW (ref 3.87–5.11)
RDW: 12.8 % (ref 11.5–15.5)
WBC Count: 5.4 10*3/uL (ref 4.0–10.5)
nRBC: 0 % (ref 0.0–0.2)

## 2023-08-10 LAB — FERRITIN: Ferritin: 181 ng/mL (ref 11–307)

## 2023-08-10 MED ORDER — EPOETIN ALFA-EPBX 10000 UNIT/ML IJ SOLN
20000.0000 [IU] | Freq: Once | INTRAMUSCULAR | Status: AC
  Administered 2023-08-10: 20000 [IU] via SUBCUTANEOUS
  Filled 2023-08-10: qty 2

## 2023-08-10 NOTE — Assessment & Plan Note (Addendum)
#   Moderate macrocytic anemia secondary ?CKD [s/p bone marrow biopsy-March 2019; dr.C-nonspecific]; MAY 2024- b12- elevated.   # Today hemoglobin: 9.0; on Retacrit/ Venofer. Proceed with retacrit today. On Gentle iron.   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 Ext Tam]; clinically no evidence of recurrence.  Continue tamoxifen for now [until 2027]. Stable. 2024- aug mammo-WNL>   # Osteopenia  AUG 2024- he BMD  T-score of -2.0.-continue Fosamax calcium plus vitamin D. 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  stable  #DISPOSITION: #  Retacrit today; HOLD Venofer today.  # In 4 weeks- labs H&H- possible Retacrit # in 8 weeks- labs H&H- possible Retacrit # Follow-up 12 weeks-MD  labs CBC BMP; iron studies; ferritin-possible Retacrit or Venofer- - Dr.B

## 2023-08-10 NOTE — Progress Notes (Signed)
 [facility] CONSULT NOTE  Patient Care Team: Marina Goodell, MD as PCP - General (Family Medicine) Mariah Milling Tollie Pizza, MD as PCP - Cardiology (Cardiology) Lemar Livings, Merrily Pew, MD (General Surgery) Mady Haagensen, MD as Consulting Physician (Internal Medicine) Jesusita Oka, MD as Consulting Physician (Dermatology) Isla Pence, OD as Consulting Physician (Optometry) Mariah Milling, Tollie Pizza, MD as Consulting Physician (Cardiology) Tedd Sias Marlana Salvage, MD as Physician Assistant (Endocrinology) Duanne Guess, MD as Consulting Physician (General Surgery) Earna Coder, MD as Consulting Physician (Internal Medicine)  CHIEF COMPLAINTS/PURPOSE OF CONSULTATION: Breast cancer   Oncology History Overview Note  Bianca Shaw is a 78 y.o. female with stage IIA (T2N0) left breast cancer s/p mastectomy with sentinel lymph node biopsy on 12/26/2015.   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  Margins were negative.  Two sentinel lymph nodes were negative for macrometastasis, but with isolated tumor cells on IHC stains.  Three additional lymph nodes were positive for isolated tumor cells on IHC.  Tumor was ER positive (> 90%), PR positive (> 90%), and Her2/neu 2+ (eqivocal).  Her2/neu by FISH was negative.  Pathologic stage was pT2 pN0(i+).   MammaPrint testing revealed low risk luminal type A. There was a 97.8% probability of being disease free at 10 years with hormonal therapy.    Exam in 11/2016 revealed a 4 mm nodule in the left axillae above the mastectomy incision.  Excision biopsy on 12/15/2016 revealed fat necrosis and no malignancy.   Carcinoma of upper-outer quadrant of left breast in female, estrogen receptor positive (HCC)  12/06/2015 Initial Diagnosis   Carcinoma of upper-outer quadrant of left breast in female, estrogen receptor positive (HCC)     HISTORY OF PRESENTING ILLNESS: Walking independently.   With her husband.   Bianca Shaw 78 y.o.  female invasive lobular breast cancer T2N1 ER/PR positive HER2 negative on tamoxifen; [name]  Notes to have improvement of the Energy levels.   Patient denies any unusual hot flashes or joint pains.  No unusual bone pain.  Review of Systems  Constitutional:  Positive for malaise/fatigue. Negative for chills, diaphoresis, fever and weight loss.  HENT:  Negative for nosebleeds and sore throat.   Eyes:  Negative for double vision.  Respiratory:  Negative for cough, hemoptysis, sputum production, shortness of breath and wheezing.   Cardiovascular:  Negative for chest pain, palpitations, orthopnea and leg swelling.  Gastrointestinal:  Negative for abdominal pain, blood in stool, constipation, diarrhea, heartburn, melena, nausea and vomiting.  Genitourinary:  Negative for dysuria, frequency and urgency.  Musculoskeletal:  Positive for joint pain. Negative for back pain.  Skin: Negative.  Negative for itching and rash.  Neurological:  Negative for dizziness, tingling, focal weakness, weakness and headaches.  Endo/Heme/Allergies:  Does not bruise/bleed easily.  Psychiatric/Behavioral:  Negative for depression. The patient is not nervous/anxious and does not have insomnia.      MEDICAL HISTORY:  Past Medical History:  Diagnosis Date   Anemia    Anxiety disorder    Breast cancer (HCC) 12/03/2015   lt breast   Breast cancer of upper-outer quadrant of left female breast (HCC) 11/2015   pT2 pN0(i+).;ER+; PR +, her 2 neu not overexpressed.  Mastectomy, SLN, Mammoprint: Low risk.    Cancer (HCC) 12/03/2015   left breast/ INVASIVE LOBULAR CARCINOMA.    Chronic kidney disease    STAGE 3   COPD (  chronic obstructive pulmonary disease) (HCC)    MILD   Cough    lingering, mild, finished Prednisone and anitbiotic 11/03/15   Dyspnea    DOE   Family history of adverse reaction to  anesthesia    sister - PONV   GERD (gastroesophageal reflux disease)    Hypertension    Myocardial infarction (HCC)    Osteopenia    Osteoporosis    Pericarditis    diagnonsed June, 2010, unclear etiology as of yer   Personal history of tobacco use, presenting hazards to health 10/31/2015   Scleroderma (HCC)    ONLY ON SKIN-MILD   UTI (lower urinary tract infection)    Wears dentures    full upper    SURGICAL HISTORY: Past Surgical History:  Procedure Laterality Date   BREAST BIOPSY Right 2012   core - neg   BREAST BIOPSY Left 12/03/2015   INVASIVE LOBULAR CARCINOMA.    CARDIAC CATHETERIZATION     CATARACT EXTRACTION W/ INTRAOCULAR LENS IMPLANT Right    CATARACT EXTRACTION W/PHACO Left 11/17/2018   Procedure: CATARACT EXTRACTION PHACO AND INTRAOCULAR LENS PLACEMENT;  Surgeon: Elliot Cousin, MD;  Location: [facility];  Service: Ophthalmology;  Laterality: Left;  [place] 00:55 CDE 11.04 Fluid Pack Lot # [id number]   COLONOSCOPY WITH PROPOFOL N/A 11/08/2015   Procedure: COLONOSCOPY WITH PROPOFOL;  Surgeon: Midge Minium, MD;  Location: [facility];  Service: Endoscopy;  Laterality: N/A;   CORONARY ANGIOPLASTY     ESOPHAGOGASTRODUODENOSCOPY (EGD) WITH PROPOFOL N/A 11/18/2017   Procedure: ESOPHAGOGASTRODUODENOSCOPY (EGD) WITH PROPOFOL;  Surgeon: Pasty Spillers, MD;  Location: [facility];  Service: Endoscopy;  Laterality: N/A;   ESOPHAGOGASTRODUODENOSCOPY (EGD) WITH PROPOFOL N/A 02/15/2018   Procedure: ESOPHAGOGASTRODUODENOSCOPY (EGD) WITH PROPOFOL;  Surgeon: Wyline Mood, MD;  Location: [facility];  Service: Gastroenterology;  Laterality: N/A;   ESOPHAGOGASTRODUODENOSCOPY (EGD) WITH PROPOFOL N/A 05/10/2018   Procedure: ESOPHAGOGASTRODUODENOSCOPY (EGD) WITH BIOPSIES;  Surgeon: Pasty Spillers, MD;  Location: [facility];  Service: Endoscopy;  Laterality: N/A;   EVACUATION BREAST HEMATOMA Left 01/14/2016   Procedure: EVACUATION HEMATOMA BREAST;  Surgeon: Earline Mayotte, MD;  Location: [facility];  Service: General;  Laterality: Left;   EYE SURGERY     MASTECTOMY Left 2017   complete mastectomy   MASTECTOMY W/ SENTINEL NODE BIOPSY Left 12/26/2015   Procedure: MASTECTOMY WITH SENTINEL LYMPH NODE BIOPSY;  Surgeon: Earline Mayotte, MD;  Location: [facility];  Service: General;  Laterality: Left;   RIGHT/LEFT HEART CATH AND CORONARY ANGIOGRAPHY N/A 07/22/2016   Procedure: Right/Left Heart Cath and Coronary Angiography;  Surgeon: Antonieta Iba, MD;  Location: [facility];  Service: Cardiovascular;  Laterality: N/A;   TUBAL LIGATION     VESICOVAGINAL FISTULA CLOSURE W/ TAH      SOCIAL HISTORY: Social History   Socioeconomic History   Marital status: Married    Spouse name: Not on file   Number of children: 2   Years of education: Not on file   Highest education level: Not on file  Occupational History   Not on file  Tobacco Use   Smoking status: Former    Current packs/day: 0.00    Average packs/day: 0.8 packs/day for 40.0 years (30.0 ttl pk-yrs)    Types: Cigarettes    Start date: 11/07/1963    Quit date: 11/07/2003    Years since quitting: 19.7   Smokeless tobacco: Never  Vaping Use   Vaping status: Never Used  Substance and Sexual Activity  Alcohol use: Yes    Alcohol/week: 1.0 - 3.0 standard drink of alcohol    Types: 1 - 3 Glasses of wine per week    Comment: Occasionally.     Drug use: No   Sexual activity: Not on file  Other Topics Concern   Not on file  Social History Narrative   She drinks 1 to 2 caffeinated beverages a day   Social Drivers of Health   Financial Resource Strain: Not on file  Food Insecurity: No Food Insecurity (05/26/2022)   [facility]   Hunger Vital Sign    Worried About Running Out of Food in the Last Year: Never true    Ran Out of Food in the Last Year: Never true  Transportation Needs: No Transportation Needs (05/26/2022)   [facility]   [facility] - Transportation    Lack of Transportation (Medical): No    Lack of Transportation (Non-Medical): No  Physical Activity: Not on file  Stress: Not on file  Social Connections: Not on file  Intimate Partner Violence: Not on file    FAMILY HISTORY: Family History  Problem Relation Age of Onset   Heart failure Mother    Epilepsy Mother    COPD Father    Heart disease Father    Anxiety disorder Sister    Arthritis Brother    Heart disease Brother    Vaginal cancer Paternal Grandmother    Heart attack Paternal Grandfather    COPD Brother    Kidney failure Brother    COPD Brother    Arthritis Sister    Uterine cancer Other    Diabetes Other    Colon cancer Neg Hx    Stomach cancer Neg Hx    Breast cancer Neg Hx     ALLERGIES:  is allergic to pimenta and tomato.  MEDICATIONS:  Current Outpatient Medications  Medication Sig Dispense Refill   albuterol (PROVENTIL) (2.5 MG/3ML) 0.083% nebulizer solution Take 3 mLs (2.5 mg total) by nebulization every 6 (six) hours as needed for wheezing or shortness of breath. 75 mL 12   amLODipine (NORVASC) 2.5 MG tablet Take 1 tablet (2.5 mg total) by mouth daily. 90 tablet 3   Cyanocobalamin (B-12) 5000 MCG CAPS Take 1,000 mcg by mouth daily.      ezetimibe (ZETIA) 10 MG tablet Take 1 tablet (10 mg total) by mouth daily. 90 tablet 3   Ferrous Sulfate (IRON PO) Take 65 mg of iron by mouth. Taking 1 tablet daily     fluticasone (FLONASE) 50 MCG/ACT nasal spray Place 2 sprays into both nostrils daily. 16 g 0   furosemide (LASIX) 20 MG tablet Take 1 tablet (20 mg total) by mouth daily. 90 tablet 3   hydrALAZINE (APRESOLINE) 25 MG tablet Take 1 tablet (25 mg total) by mouth 2 (two) times daily. 180 tablet 3   lisinopril (ZESTRIL) 20 MG tablet Take 2 tablets (40 mg total) by mouth daily. 180 tablet 3   metoprolol succinate (TOPROL-XL) 25 MG 24 hr tablet Take 1 tablet (25 mg total) by mouth daily. 90 tablet 3    nitroGLYCERIN (NITROSTAT) 0.4 MG SL tablet Place 1 tablet (0.4 mg total) under the tongue every 5 (five) minutes as needed for chest pain. 30 tablet 3   pantoprazole (PROTONIX) 40 MG tablet Take by mouth.     potassium chloride SA (KLOR-CON M) 20 MEQ tablet Take 1 tablet (20 mEq total) by mouth daily. 90  tablet 3   simvastatin (ZOCOR) 40 MG tablet Take 1 tablet (40 mg total) by mouth daily. 90 tablet 3   sodium chloride 1 g tablet Take 1 g by mouth daily.     tamoxifen (NOLVADEX) 20 MG tablet Take 1 tablet (20 mg total) by mouth daily. 90 tablet 1   GARLIC PO Take 1 tablet by mouth daily. (Patient not taking: Reported on 04/30/2023)     KRILL OIL PO Take 1 capsule by mouth daily. (Patient not taking: Reported on 04/30/2023)     ondansetron (ZOFRAN ODT) 4 MG disintegrating tablet Take 1 tablet (4 mg total) by mouth every 8 (eight) hours as needed. (Patient not taking: Reported on 04/30/2023) 20 tablet 1   No current facility-administered medications for this visit.   Facility-Administered Medications Ordered in Other Visits  Medication Dose Route Frequency Provider Last Rate Last Admin   epoetin alfa-epbx (RETACRIT) injection 20,000 Units  20,000 Units Subcutaneous Once Louretta Shorten R, MD          .  PHYSICAL EXAMINATION: ECOG PERFORMANCE STATUS: 1 - Symptomatic but completely ambulatory  Vitals:   08/10/23 1411 08/10/23 1422  BP: (!) 153/60 (!) 152/61  Pulse: (!) 56   Resp: (!) 24   Temp: (!) 97.5 F (36.4 C)   SpO2: 100%    Filed Weights   08/10/23 1411  Weight: 149 lb 9.6 oz (67.9 kg)    Physical Exam Vitals and nursing note reviewed.  Constitutional:      Comments:      HENT:     Head: Normocephalic and atraumatic.     Mouth/Throat:     Pharynx: Oropharynx is clear.  Eyes:     Extraocular Movements: Extraocular movements intact.     Pupils: Pupils are equal, round, and reactive to light.  Cardiovascular:     Rate and Rhythm: Normal rate and regular rhythm.   Pulmonary:     Comments: Decreased breath sounds bilaterally.  Abdominal:     Palpations: Abdomen is soft.  Musculoskeletal:        General: Normal range of motion.     Cervical back: Normal range of motion.  Skin:    General: Skin is warm.  Neurological:     General: No focal deficit present.     Mental Status: She is alert and oriented to person, place, and time.  Psychiatric:        Behavior: Behavior normal.        Judgment: Judgment normal.      LABORATORY DATA:  I have reviewed the data as listed Lab Results  Component Value Date   WBC 5.4 08/10/2023   HGB 9.0 (L) 08/10/2023   HCT 27.2 (L) 08/10/2023   MCV 104.6 (H) 08/10/2023   PLT 190 08/10/2023   Recent Labs    09/01/22 0825 10/06/22 1439 11/05/22 1248 02/23/23 1417 05/18/23 1447 08/10/23 1402  NA 135 135   < > 136 133* 132*  K 3.2* 4.2   < > 4.0 4.4 4.0  CL 102 102   < > 105 101 101  CO2 21* 23   < > 24 22 23   GLUCOSE 86 104*   < > 110* 114* 106*  BUN 12 30*   < > 26* 32* 26*  CREATININE 1.33* 2.07*   < > 1.65* 1.58* 1.43*  CALCIUM 8.6* 9.0   < > 9.0 9.1 8.7*  GFRNONAA 41* 24*   < > 32* 33* 38*  PROT 7.1 7.8  --   --   --   --  ALBUMIN 3.7 3.7  --   --   --   --   AST 24 19  --   --   --   --   ALT 12 13  --   --   --   --   ALKPHOS 34* 36*  --   --   --   --   BILITOT 0.3 0.2*  --   --   --   --    < > = values in this interval not displayed.    RADIOGRAPHIC STUDIES: I have personally reviewed the radiological images as listed and agreed with the findings in the report. No results found.   ASSESSMENT & PLAN:   Symptomatic anemia # Moderate macrocytic anemia secondary ?CKD [s/p bone marrow biopsy-March 2019; dr.C-nonspecific]; MAY 2024- b12- elevated.   # Today hemoglobin: 9.0; on Retacrit/ Venofer. Proceed with retacrit today. On Gentle iron.   # # # Stage IIA left breast cancer- Invasive lobular carcinoma s/p mastec;[Uterus-intact] 4.2 cm lesion; T2, N1 (iso).; ER/ PR positive; started  tamoxifen October 2017[? Ext Tam]; clinically no evidence of recurrence.  Continue tamoxifen for now [until 2027]. Stable. 2024- aug mammo-WNL>   # Osteopenia  AUG 2024- he BMD  T-score of -2.0.-continue Fosamax calcium plus vitamin D. 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  stable  #DISPOSITION: #  Retacrit today; HOLD Venofer today.  # In 4 weeks- labs H&H- possible Retacrit # in 8 weeks- labs H&H- possible Retacrit # Follow-up 12 weeks-MD  labs CBC BMP; iron studies; ferritin-possible Retacrit or Venofer- - Dr.B    All questions were answered. The patient knows to call the clinic with any problems, questions or concerns.    Gwyn Leos, MD 08/10/2023 2:50 PM

## 2023-08-10 NOTE — Progress Notes (Signed)
 Fatigue/weakness: YES Dyspena: NO  Light headedness: NO Blood in stool: NO

## 2023-09-02 DIAGNOSIS — N1832 Chronic kidney disease, stage 3b: Secondary | ICD-10-CM | POA: Diagnosis not present

## 2023-09-02 DIAGNOSIS — D631 Anemia in chronic kidney disease: Secondary | ICD-10-CM | POA: Diagnosis not present

## 2023-09-02 DIAGNOSIS — N2581 Secondary hyperparathyroidism of renal origin: Secondary | ICD-10-CM | POA: Diagnosis not present

## 2023-09-02 DIAGNOSIS — E871 Hypo-osmolality and hyponatremia: Secondary | ICD-10-CM | POA: Diagnosis not present

## 2023-09-02 DIAGNOSIS — R809 Proteinuria, unspecified: Secondary | ICD-10-CM | POA: Diagnosis not present

## 2023-09-02 DIAGNOSIS — I10 Essential (primary) hypertension: Secondary | ICD-10-CM | POA: Diagnosis not present

## 2023-09-07 ENCOUNTER — Inpatient Hospital Stay

## 2023-09-07 ENCOUNTER — Inpatient Hospital Stay: Attending: Internal Medicine

## 2023-09-07 DIAGNOSIS — D631 Anemia in chronic kidney disease: Secondary | ICD-10-CM | POA: Diagnosis not present

## 2023-09-07 DIAGNOSIS — N1832 Chronic kidney disease, stage 3b: Secondary | ICD-10-CM | POA: Diagnosis not present

## 2023-09-07 DIAGNOSIS — E871 Hypo-osmolality and hyponatremia: Secondary | ICD-10-CM | POA: Diagnosis not present

## 2023-09-07 DIAGNOSIS — N2581 Secondary hyperparathyroidism of renal origin: Secondary | ICD-10-CM | POA: Diagnosis not present

## 2023-09-07 DIAGNOSIS — I10 Essential (primary) hypertension: Secondary | ICD-10-CM | POA: Diagnosis not present

## 2023-10-05 ENCOUNTER — Inpatient Hospital Stay: Attending: Internal Medicine

## 2023-10-05 ENCOUNTER — Inpatient Hospital Stay

## 2023-10-05 VITALS — BP 176/76

## 2023-10-05 DIAGNOSIS — D649 Anemia, unspecified: Secondary | ICD-10-CM

## 2023-10-05 DIAGNOSIS — D631 Anemia in chronic kidney disease: Secondary | ICD-10-CM | POA: Insufficient documentation

## 2023-10-05 DIAGNOSIS — N1832 Chronic kidney disease, stage 3b: Secondary | ICD-10-CM | POA: Diagnosis not present

## 2023-10-05 LAB — HEMOGLOBIN AND HEMATOCRIT (CANCER CENTER ONLY)
HCT: 29.5 % — ABNORMAL LOW (ref 36.0–46.0)
Hemoglobin: 9.8 g/dL — ABNORMAL LOW (ref 12.0–15.0)

## 2023-10-05 MED ORDER — EPOETIN ALFA-EPBX 20000 UNIT/ML IJ SOLN
20000.0000 [IU] | Freq: Once | INTRAMUSCULAR | Status: AC
  Administered 2023-10-05: 20000 [IU] via SUBCUTANEOUS
  Filled 2023-10-05: qty 1

## 2023-10-18 ENCOUNTER — Other Ambulatory Visit: Payer: Self-pay | Admitting: Cardiovascular Disease

## 2023-10-18 DIAGNOSIS — I10 Essential (primary) hypertension: Secondary | ICD-10-CM

## 2023-10-18 DIAGNOSIS — H35033 Hypertensive retinopathy, bilateral: Secondary | ICD-10-CM | POA: Diagnosis not present

## 2023-10-18 DIAGNOSIS — H35372 Puckering of macula, left eye: Secondary | ICD-10-CM | POA: Diagnosis not present

## 2023-10-26 ENCOUNTER — Encounter (INDEPENDENT_AMBULATORY_CARE_PROVIDER_SITE_OTHER): Admitting: Ophthalmology

## 2023-10-26 DIAGNOSIS — H35033 Hypertensive retinopathy, bilateral: Secondary | ICD-10-CM | POA: Diagnosis not present

## 2023-10-26 DIAGNOSIS — H35342 Macular cyst, hole, or pseudohole, left eye: Secondary | ICD-10-CM | POA: Diagnosis not present

## 2023-10-26 DIAGNOSIS — H43813 Vitreous degeneration, bilateral: Secondary | ICD-10-CM

## 2023-10-26 DIAGNOSIS — H34832 Tributary (branch) retinal vein occlusion, left eye, with macular edema: Secondary | ICD-10-CM

## 2023-10-26 DIAGNOSIS — H35372 Puckering of macula, left eye: Secondary | ICD-10-CM

## 2023-10-26 DIAGNOSIS — I10 Essential (primary) hypertension: Secondary | ICD-10-CM

## 2023-11-02 ENCOUNTER — Encounter: Payer: Self-pay | Admitting: Internal Medicine

## 2023-11-02 ENCOUNTER — Inpatient Hospital Stay

## 2023-11-02 ENCOUNTER — Inpatient Hospital Stay (HOSPITAL_BASED_OUTPATIENT_CLINIC_OR_DEPARTMENT_OTHER): Admitting: Internal Medicine

## 2023-11-02 ENCOUNTER — Inpatient Hospital Stay: Attending: Internal Medicine

## 2023-11-02 VITALS — BP 144/65 | HR 59 | Temp 97.9°F | Resp 24 | Ht 62.0 in | Wt 145.7 lb

## 2023-11-02 VITALS — BP 133/63 | HR 54

## 2023-11-02 DIAGNOSIS — E222 Syndrome of inappropriate secretion of antidiuretic hormone: Secondary | ICD-10-CM | POA: Diagnosis not present

## 2023-11-02 DIAGNOSIS — D631 Anemia in chronic kidney disease: Secondary | ICD-10-CM | POA: Diagnosis not present

## 2023-11-02 DIAGNOSIS — Z79810 Long term (current) use of selective estrogen receptor modulators (SERMs): Secondary | ICD-10-CM | POA: Insufficient documentation

## 2023-11-02 DIAGNOSIS — Z170 Estrogen receptor positive status [ER+]: Secondary | ICD-10-CM | POA: Diagnosis not present

## 2023-11-02 DIAGNOSIS — Z87891 Personal history of nicotine dependence: Secondary | ICD-10-CM | POA: Diagnosis not present

## 2023-11-02 DIAGNOSIS — Z1239 Encounter for other screening for malignant neoplasm of breast: Secondary | ICD-10-CM

## 2023-11-02 DIAGNOSIS — E878 Other disorders of electrolyte and fluid balance, not elsewhere classified: Secondary | ICD-10-CM | POA: Insufficient documentation

## 2023-11-02 DIAGNOSIS — D649 Anemia, unspecified: Secondary | ICD-10-CM

## 2023-11-02 DIAGNOSIS — Z9012 Acquired absence of left breast and nipple: Secondary | ICD-10-CM | POA: Insufficient documentation

## 2023-11-02 DIAGNOSIS — N1832 Chronic kidney disease, stage 3b: Secondary | ICD-10-CM | POA: Insufficient documentation

## 2023-11-02 DIAGNOSIS — C50412 Malignant neoplasm of upper-outer quadrant of left female breast: Secondary | ICD-10-CM

## 2023-11-02 DIAGNOSIS — Z1732 Human epidermal growth factor receptor 2 negative status: Secondary | ICD-10-CM | POA: Diagnosis not present

## 2023-11-02 LAB — BASIC METABOLIC PANEL WITH GFR
Anion gap: 7 (ref 5–15)
BUN: 41 mg/dL — ABNORMAL HIGH (ref 8–23)
CO2: 21 mmol/L — ABNORMAL LOW (ref 22–32)
Calcium: 9.3 mg/dL (ref 8.9–10.3)
Chloride: 101 mmol/L (ref 98–111)
Creatinine, Ser: 1.78 mg/dL — ABNORMAL HIGH (ref 0.44–1.00)
GFR, Estimated: 29 mL/min — ABNORMAL LOW (ref >60)
Glucose, Bld: 101 mg/dL — ABNORMAL HIGH (ref 70–99)
Potassium: 4.6 mmol/L (ref 3.5–5.1)
Sodium: 129 mmol/L — ABNORMAL LOW (ref 135–145)

## 2023-11-02 LAB — CBC WITH DIFFERENTIAL (CANCER CENTER ONLY)
Abs Immature Granulocytes: 0.01 K/uL (ref 0.00–0.07)
Basophils Absolute: 0.1 K/uL (ref 0.0–0.1)
Basophils Relative: 1 %
Eosinophils Absolute: 0.2 K/uL (ref 0.0–0.5)
Eosinophils Relative: 4 %
HCT: 34.3 % — ABNORMAL LOW (ref 36.0–46.0)
Hemoglobin: 11.4 g/dL — ABNORMAL LOW (ref 12.0–15.0)
Immature Granulocytes: 0 %
Lymphocytes Relative: 21 %
Lymphs Abs: 1.3 K/uL (ref 0.7–4.0)
MCH: 33.6 pg (ref 26.0–34.0)
MCHC: 33.2 g/dL (ref 30.0–36.0)
MCV: 101.2 fL — ABNORMAL HIGH (ref 80.0–100.0)
Monocytes Absolute: 0.8 K/uL (ref 0.1–1.0)
Monocytes Relative: 13 %
Neutro Abs: 3.6 K/uL (ref 1.7–7.7)
Neutrophils Relative %: 61 %
Platelet Count: 228 K/uL (ref 150–400)
RBC: 3.39 MIL/uL — ABNORMAL LOW (ref 3.87–5.11)
RDW: 12.7 % (ref 11.5–15.5)
WBC Count: 6 K/uL (ref 4.0–10.5)
nRBC: 0 % (ref 0.0–0.2)

## 2023-11-02 LAB — IRON AND TIBC
Iron: 106 ug/dL (ref 28–170)
Saturation Ratios: 32 % — ABNORMAL HIGH (ref 10.4–31.8)
TIBC: 333 ug/dL (ref 250–450)
UIBC: 227 ug/dL

## 2023-11-02 LAB — FERRITIN: Ferritin: 130 ng/mL (ref 11–307)

## 2023-11-02 MED ORDER — IRON SUCROSE 20 MG/ML IV SOLN
200.0000 mg | Freq: Once | INTRAVENOUS | Status: AC
  Administered 2023-11-02: 200 mg via INTRAVENOUS

## 2023-11-02 NOTE — Progress Notes (Signed)
 [facility] CONSULT NOTE  Patient Care Team: Jeffie Cheryl BRAVO, MD as PCP - General (Family Medicine) Perla Evalene PARAS, MD as PCP - Cardiology (Cardiology) Dessa, Reyes ORN, MD (General Surgery) Lateef, Munsoor, MD as Consulting Physician (Internal Medicine) Cathlyn Seal, MD as Consulting Physician (Dermatology) Mevelyn JONETTA Bathe, OD as Consulting Physician (Optometry) Perla, Evalene PARAS, MD as Consulting Physician (Cardiology) Damian Therisa HERO, MD as Physician Assistant (Endocrinology) Marolyn Nest, MD as Consulting Physician (General Surgery) Rennie Bianca SAUNDERS, MD as Consulting Physician (Internal Medicine)  CHIEF COMPLAINTS/PURPOSE OF CONSULTATION: Breast cancer   Oncology History Overview Note  Bianca Shaw is a 78 y.o. female with stage IIA (T2N0) left breast cancer s/p mastectomy with sentinel lymph node biopsy on 12/26/2015.   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  Margins were negative.  Two sentinel lymph nodes were negative for macrometastasis, but with isolated tumor cells on IHC stains.  Three additional lymph nodes were positive for isolated tumor cells on IHC.  Tumor was ER positive (> 90%), PR positive (> 90%), and Her2/neu 2+ (eqivocal).  Her2/neu by FISH was negative.  Pathologic stage was pT2 pN0(i+).   MammaPrint testing revealed low risk luminal type A. There was a 97.8% probability of being disease free at 10 years with hormonal therapy.    Exam in 11/2016 revealed a 4 mm nodule in the left axillae above the mastectomy incision.  Excision biopsy on 12/15/2016 revealed fat necrosis and no malignancy.   Carcinoma of upper-outer quadrant of left breast in female, estrogen receptor positive (HCC)  12/06/2015 Initial Diagnosis   Carcinoma of upper-outer quadrant of left breast in female, estrogen receptor positive (HCC)     HISTORY OF PRESENTING ILLNESS: Walking independently.   With her husband.   Bianca Shaw 78 y.o.  female invasive lobular breast cancer T2N1 ER/PR positive HER2 negative on tamoxifen ; [name]  Notes to have improvement of the Energy levels.  Patient denies any unusual hot flashes or joint pains.  No unusual bone pain.  Review of Systems  Constitutional:  Positive for malaise/fatigue. Negative for chills, diaphoresis, fever and weight loss.  HENT:  Negative for nosebleeds and sore throat.   Eyes:  Negative for double vision.  Respiratory:  Negative for cough, hemoptysis, sputum production, shortness of breath and wheezing.   Cardiovascular:  Negative for chest pain, palpitations, orthopnea and leg swelling.  Gastrointestinal:  Negative for abdominal pain, blood in stool, constipation, diarrhea, heartburn, melena, nausea and vomiting.  Genitourinary:  Negative for dysuria, frequency and urgency.  Musculoskeletal:  Positive for joint pain. Negative for back pain.  Skin: Negative.  Negative for itching and rash.  Neurological:  Negative for dizziness, tingling, focal weakness, weakness and headaches.  Endo/Heme/Allergies:  Does not bruise/bleed easily.  Psychiatric/Behavioral:  Negative for depression. The patient is not nervous/anxious and does not have insomnia.      MEDICAL HISTORY:  Past Medical History:  Diagnosis Date   Anemia    Anxiety disorder    Breast cancer (HCC) 12/03/2015   lt breast   Breast cancer of upper-outer quadrant of left female breast (HCC) 11/2015   pT2 pN0(i+).;ER+; PR +, her 2 neu not overexpressed.  Mastectomy, SLN, Mammoprint: Low risk.    Cancer (HCC) 12/03/2015   left breast/ INVASIVE LOBULAR CARCINOMA.    Chronic kidney disease    STAGE 3   COPD (chronic  obstructive pulmonary disease) (HCC)    MILD   Cough    lingering, mild, finished Prednisone  and anitbiotic 11/03/15   Dyspnea    DOE   Family history of adverse reaction to  anesthesia    sister - PONV   GERD (gastroesophageal reflux disease)    Hypertension    Myocardial infarction (HCC)    Osteopenia    Osteoporosis    Pericarditis    diagnonsed June, 2010, unclear etiology as of yer   Personal history of tobacco use, presenting hazards to health 10/31/2015   Scleroderma (HCC)    ONLY ON SKIN-MILD   UTI (lower urinary tract infection)    Wears dentures    full upper    SURGICAL HISTORY: Past Surgical History:  Procedure Laterality Date   BREAST BIOPSY Right 2012   core - neg   BREAST BIOPSY Left 12/03/2015   INVASIVE LOBULAR CARCINOMA.    CARDIAC CATHETERIZATION     CATARACT EXTRACTION W/ INTRAOCULAR LENS IMPLANT Right    CATARACT EXTRACTION W/PHACO Left 11/17/2018   Procedure: CATARACT EXTRACTION PHACO AND INTRAOCULAR LENS PLACEMENT;  Surgeon: Ferol Rogue, MD;  Location: [facility];  Service: Ophthalmology;  Laterality: Left;  US  00:55 CDE 11.04 Fluid Pack Lot # [id number]   COLONOSCOPY WITH PROPOFOL  N/A 11/08/2015   Procedure: COLONOSCOPY WITH PROPOFOL ;  Surgeon: Rogelia Copping, MD;  Location: [facility];  Service: Endoscopy;  Laterality: N/A;   CORONARY ANGIOPLASTY     ESOPHAGOGASTRODUODENOSCOPY (EGD) WITH PROPOFOL  N/A 11/18/2017   Procedure: ESOPHAGOGASTRODUODENOSCOPY (EGD) WITH PROPOFOL ;  Surgeon: Janalyn Keene NOVAK, MD;  Location: [facility];  Service: Endoscopy;  Laterality: N/A;   ESOPHAGOGASTRODUODENOSCOPY (EGD) WITH PROPOFOL  N/A 02/15/2018   Procedure: ESOPHAGOGASTRODUODENOSCOPY (EGD) WITH PROPOFOL ;  Surgeon: Therisa Bi, MD;  Location: [facility];  Service: Gastroenterology;  Laterality: N/A;   ESOPHAGOGASTRODUODENOSCOPY (EGD) WITH PROPOFOL  N/A 05/10/2018   Procedure: ESOPHAGOGASTRODUODENOSCOPY (EGD) WITH BIOPSIES;  Surgeon: Janalyn Keene NOVAK, MD;  Location: [facility];  Service: Endoscopy;  Laterality: N/A;   EVACUATION BREAST HEMATOMA Left 01/14/2016   Procedure: EVACUATION HEMATOMA BREAST;  Surgeon: Reyes LELON Cota, MD;  Location: [facility];  Service: General;  Laterality: Left;   EYE SURGERY     MASTECTOMY Left 2017   complete mastectomy   MASTECTOMY W/ SENTINEL NODE BIOPSY Left 12/26/2015   Procedure: MASTECTOMY WITH SENTINEL LYMPH NODE BIOPSY;  Surgeon: Reyes LELON Cota, MD;  Location: [facility];  Service: General;  Laterality: Left;   RIGHT/LEFT HEART CATH AND CORONARY ANGIOGRAPHY N/A 07/22/2016   Procedure: Right/Left Heart Cath and Coronary Angiography;  Surgeon: Evalene JINNY Lunger, MD;  Location: [facility] INV[facility];  Service: Cardiovascular;  Laterality: N/A;   TUBAL LIGATION     VESICOVAGINAL FISTULA CLOSURE W/ TAH      SOCIAL HISTORY: Social History   Socioeconomic History   Marital status: Married    Spouse name: Not on file   Number of children: 2   Years of education: Not on file   Highest education level: Not on file  Occupational History   Not on file  Tobacco Use   Smoking status: Former    Current packs/day: 0.00    Average packs/day: 0.8 packs/day for 40.0 years (30.0 ttl pk-yrs)    Types: Cigarettes    Start date: 11/07/1963    Quit date: 11/07/2003    Years since quitting: 20.0   Smokeless tobacco: Never  Vaping Use   Vaping status: Never Used  Substance and Sexual Activity  Alcohol use: Yes    Alcohol/week: 1.0 - 3.0 standard drink of alcohol    Types: 1 - 3 Glasses of wine per week    Comment: Occasionally.     Drug use: No   Sexual activity: Not on file  Other Topics Concern   Not on file  Social History Narrative   She drinks 1 to 2 caffeinated beverages a day   Social Drivers of Health   Financial Resource Strain: Not on file  Food Insecurity: No Food Insecurity (05/26/2022)   Received from [facility]   Hunger Vital Sign    Within the past 12 months, you worried that your food would run out before you got the money to buy more.: Never true    Within the past 12 months, the food you bought just didn't last and you didn't have money to get  more.: Never true  Transportation Needs: No Transportation Needs (05/26/2022)   Received from [facility]   PRAPARE - Transportation    Lack of Transportation (Medical): No    Lack of Transportation (Non-Medical): No  Physical Activity: Not on file  Stress: Not on file  Social Connections: Not on file  Intimate Partner Violence: Not on file    FAMILY HISTORY: Family History  Problem Relation Age of Onset   Heart failure Mother    Epilepsy Mother    COPD Father    Heart disease Father    Anxiety disorder Sister    Arthritis Brother    Heart disease Brother    Vaginal cancer Paternal Grandmother    Heart attack Paternal Grandfather    COPD Brother    Kidney failure Brother    COPD Brother    Arthritis Sister    Uterine cancer Other    Diabetes Other    Colon cancer Neg Hx    Stomach cancer Neg Hx    Breast cancer Neg Hx     ALLERGIES:  is allergic to pimenta and tomato.  MEDICATIONS:  Current Outpatient Medications  Medication Sig Dispense Refill   albuterol  (PROVENTIL ) (2.5 MG/3ML) 0.083% nebulizer solution Take 3 mLs (2.5 mg total) by nebulization every 6 (six) hours as needed for wheezing or shortness of breath. 75 mL 12   amLODipine  (NORVASC ) 2.5 MG tablet Take 1 tablet (2.5 mg total) by mouth daily. 90 tablet 3   Cyanocobalamin  (B-12) 5000 MCG CAPS Take 1,000 mcg by mouth daily.      ezetimibe  (ZETIA ) 10 MG tablet Take 1 tablet (10 mg total) by mouth daily. 90 tablet 3   Ferrous Sulfate (IRON  PO) Take 65 mg of iron  by mouth. Taking 1 tablet daily     fluticasone  (FLONASE ) 50 MCG/ACT nasal spray Place 2 sprays into both nostrils daily. 16 g 0   furosemide  (LASIX ) 20 MG tablet Take 1 tablet (20 mg total) by mouth daily. 90 tablet 3   hydrALAZINE  (APRESOLINE ) 25 MG tablet Take 1 tablet (25 mg total) by mouth 2 (two) times daily. 180 tablet 2   lisinopril  (ZESTRIL ) 20 MG tablet Take 2 tablets (40 mg total) by mouth daily. 180 tablet 3   metoprolol  succinate  (TOPROL -XL) 25 MG 24 hr tablet Take 1 tablet (25 mg total) by mouth daily. 90 tablet 3   nitroGLYCERIN  (NITROSTAT ) 0.4 MG SL tablet Place 1 tablet (0.4 mg total) under the tongue every 5 (five) minutes as needed for chest pain. 30 tablet 3   pantoprazole  (PROTONIX ) 40 MG tablet Take by mouth.  potassium chloride  SA (KLOR-CON  M) 20 MEQ tablet Take 1 tablet (20 mEq total) by mouth daily. 90 tablet 3   simvastatin  (ZOCOR ) 40 MG tablet Take 1 tablet (40 mg total) by mouth daily. 90 tablet 3   sodium chloride  1 g tablet Take 1 g by mouth daily.     tamoxifen  (NOLVADEX ) 20 MG tablet Take 1 tablet (20 mg total) by mouth daily. 90 tablet 1   No current facility-administered medications for this visit.      SABRA  PHYSICAL EXAMINATION: ECOG PERFORMANCE STATUS: 1 - Symptomatic but completely ambulatory  Vitals:   11/02/23 1444  BP: (!) 144/65  Pulse: (!) 59  Resp: (!) 24  Temp: 97.9 F (36.6 C)  SpO2: 99%    Filed Weights   11/02/23 1444  Weight: 145 lb 11.2 oz (66.1 kg)     Physical Exam Vitals and nursing note reviewed.  Constitutional:      Comments:      HENT:     Head: Normocephalic and atraumatic.     Mouth/Throat:     Pharynx: Oropharynx is clear.  Eyes:     Extraocular Movements: Extraocular movements intact.     Pupils: Pupils are equal, round, and reactive to light.  Cardiovascular:     Rate and Rhythm: Normal rate and regular rhythm.  Pulmonary:     Comments: Decreased breath sounds bilaterally.  Abdominal:     Palpations: Abdomen is soft.  Musculoskeletal:        General: Normal range of motion.     Cervical back: Normal range of motion.  Skin:    General: Skin is warm.  Neurological:     General: No focal deficit present.     Mental Status: She is alert and oriented to person, place, and time.  Psychiatric:        Behavior: Behavior normal.        Judgment: Judgment normal.      LABORATORY DATA:  I have reviewed the data as listed Lab Results   Component Value Date   WBC 6.0 11/02/2023   HGB 11.4 (L) 11/02/2023   HCT 34.3 (L) 11/02/2023   MCV 101.2 (H) 11/02/2023   PLT 228 11/02/2023   Recent Labs    05/18/23 1447 08/10/23 1402 11/02/23 1448  NA 133* 132* 129*  K 4.4 4.0 4.6  CL 101 101 101  CO2 22 23 21*  GLUCOSE 114* 106* 101*  BUN 32* 26* 41*  CREATININE 1.58* 1.43* 1.78*  CALCIUM 9.1 8.7* 9.3  GFRNONAA 33* 38* 29*    RADIOGRAPHIC STUDIES: I have personally reviewed the radiological images as listed and agreed with the findings in the report. No results found.   ASSESSMENT & PLAN:   Carcinoma of upper-outer quadrant of left breast in female, estrogen receptor positive (HCC)          Symptomatic anemia # Moderate macrocytic anemia secondary ?CKD [s/p bone marrow biopsy-March 2019; dr.C-nonspecific]; MAY 2024- b12- elevated.   # Today hemoglobin:11- on Retacrit / Venofer .   On Gentle iron .   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 Ext Tam]; clinically no evidence of recurrence.  Continue tamoxifen  for now [until 2027]. Stable. 2024- aug mammo-WNL>   # Osteopenia  AUG 2024- he BMD  T-score of -2.0.-continue Fosamax  calcium plus vitamin D . 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  stable  #DISPOSITION: # Uni lt right mammo in aug 2025 #  HOLD Retacrit  today; proceed with Venofer  today.  # In 4 weeks- labs H&H- possible Retacrit  # in 8 weeks- labs H&H- possible Retacrit  # Follow-up 12 weeks-MD  labs CBC BMP; iron  studies; ferritin-possible Retacrit  or Venofer - - Dr.B    All questions were answered. The patient knows to call the clinic with any problems, questions or concerns.    Bianca JONELLE Joe, MD 11/02/2023 3:25 PM

## 2023-11-02 NOTE — Patient Instructions (Signed)

## 2023-11-02 NOTE — Assessment & Plan Note (Addendum)
#   Moderate macrocytic anemia secondary ?CKD [s/p bone marrow biopsy-March 2019; dr.C-nonspecific]; MAY 2024- b12- elevated.   # Today hemoglobin:11- on Retacrit / Venofer .   On Gentle iron .   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 Ext Tam]; clinically no evidence of recurrence.  Continue tamoxifen  for now [until 2027]. Stable. 2024- aug mammo-WNL>   # Osteopenia  AUG 2024- he BMD  T-score of -2.0.-continue Fosamax  calcium plus vitamin D . 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  stable  #DISPOSITION: # Uni lt right mammo in aug 2025 #  HOLD Retacrit  today; proceed with Venofer  today.  # In 4 weeks- labs H&H- possible Retacrit  # in 8 weeks- labs H&H- possible Retacrit  # Follow-up 12 weeks-MD  labs CBC BMP; iron  studies; ferritin-possible Retacrit  or Venofer - - Dr.B

## 2023-11-02 NOTE — Assessment & Plan Note (Addendum)
 Bianca Shaw

## 2023-11-02 NOTE — Progress Notes (Signed)
 Fatigue/weakness: YES Dyspena: NO  Light headedness: AT TIMES Blood in stool:  NO  Pt had laser eye surgery done on the left eye, Dr. Alvia.

## 2023-11-02 NOTE — Progress Notes (Signed)
 Pt declined 30 minute post-observation. Aware of risks. Vitals stable at discharge.

## 2023-11-30 ENCOUNTER — Inpatient Hospital Stay: Attending: Internal Medicine

## 2023-11-30 ENCOUNTER — Inpatient Hospital Stay

## 2023-11-30 DIAGNOSIS — N1832 Chronic kidney disease, stage 3b: Secondary | ICD-10-CM | POA: Insufficient documentation

## 2023-11-30 DIAGNOSIS — Z79810 Long term (current) use of selective estrogen receptor modulators (SERMs): Secondary | ICD-10-CM | POA: Diagnosis not present

## 2023-11-30 DIAGNOSIS — Z170 Estrogen receptor positive status [ER+]: Secondary | ICD-10-CM | POA: Diagnosis not present

## 2023-11-30 DIAGNOSIS — Z87891 Personal history of nicotine dependence: Secondary | ICD-10-CM | POA: Insufficient documentation

## 2023-11-30 DIAGNOSIS — Z1731 Human epidermal growth factor receptor 2 positive status: Secondary | ICD-10-CM | POA: Diagnosis not present

## 2023-11-30 DIAGNOSIS — D649 Anemia, unspecified: Secondary | ICD-10-CM

## 2023-11-30 DIAGNOSIS — D631 Anemia in chronic kidney disease: Secondary | ICD-10-CM | POA: Diagnosis not present

## 2023-11-30 DIAGNOSIS — Z1721 Progesterone receptor positive status: Secondary | ICD-10-CM | POA: Insufficient documentation

## 2023-11-30 DIAGNOSIS — C50412 Malignant neoplasm of upper-outer quadrant of left female breast: Secondary | ICD-10-CM | POA: Insufficient documentation

## 2023-11-30 LAB — HEMOGLOBIN AND HEMATOCRIT (CANCER CENTER ONLY)
HCT: 31.1 % — ABNORMAL LOW (ref 36.0–46.0)
Hemoglobin: 10.7 g/dL — ABNORMAL LOW (ref 12.0–15.0)

## 2023-11-30 NOTE — Progress Notes (Signed)
 Hgb 10.7, no injection given today

## 2023-12-07 ENCOUNTER — Ambulatory Visit
Admission: RE | Admit: 2023-12-07 | Discharge: 2023-12-07 | Disposition: A | Discharge status: Home / Self Care | Source: Ambulatory Visit | Attending: Internal Medicine | Admitting: Internal Medicine

## 2023-12-07 DIAGNOSIS — Z1231 Encounter for screening mammogram for malignant neoplasm of breast: Secondary | ICD-10-CM | POA: Diagnosis not present

## 2023-12-07 DIAGNOSIS — Z1239 Encounter for other screening for malignant neoplasm of breast: Secondary | ICD-10-CM | POA: Insufficient documentation

## 2023-12-07 DIAGNOSIS — Z170 Estrogen receptor positive status [ER+]: Secondary | ICD-10-CM | POA: Insufficient documentation

## 2023-12-07 DIAGNOSIS — C50412 Malignant neoplasm of upper-outer quadrant of left female breast: Secondary | ICD-10-CM | POA: Insufficient documentation

## 2023-12-24 ENCOUNTER — Other Ambulatory Visit: Payer: Self-pay

## 2023-12-24 DIAGNOSIS — N189 Chronic kidney disease, unspecified: Secondary | ICD-10-CM

## 2023-12-28 ENCOUNTER — Inpatient Hospital Stay: Attending: Internal Medicine

## 2023-12-28 ENCOUNTER — Inpatient Hospital Stay

## 2023-12-28 DIAGNOSIS — Z87891 Personal history of nicotine dependence: Secondary | ICD-10-CM | POA: Diagnosis not present

## 2023-12-28 DIAGNOSIS — D631 Anemia in chronic kidney disease: Secondary | ICD-10-CM | POA: Insufficient documentation

## 2023-12-28 DIAGNOSIS — C50412 Malignant neoplasm of upper-outer quadrant of left female breast: Secondary | ICD-10-CM | POA: Diagnosis not present

## 2023-12-28 DIAGNOSIS — N1832 Chronic kidney disease, stage 3b: Secondary | ICD-10-CM | POA: Diagnosis not present

## 2023-12-28 DIAGNOSIS — Z170 Estrogen receptor positive status [ER+]: Secondary | ICD-10-CM | POA: Insufficient documentation

## 2023-12-28 DIAGNOSIS — M810 Age-related osteoporosis without current pathological fracture: Secondary | ICD-10-CM | POA: Insufficient documentation

## 2023-12-28 LAB — SAMPLE TO BLOOD BANK

## 2023-12-28 LAB — HEMOGLOBIN AND HEMATOCRIT (CANCER CENTER ONLY)
HCT: 30.1 % — ABNORMAL LOW (ref 36.0–46.0)
Hemoglobin: 10.1 g/dL — ABNORMAL LOW (ref 12.0–15.0)

## 2023-12-28 NOTE — Progress Notes (Signed)
 Parameters for Retacrit :  HOLD RETACRIT  if hemoglobin is equal to greater than 10     Hgb 10.1 today, no inj given.

## 2024-01-05 DIAGNOSIS — N1832 Chronic kidney disease, stage 3b: Secondary | ICD-10-CM | POA: Diagnosis not present

## 2024-01-05 DIAGNOSIS — D631 Anemia in chronic kidney disease: Secondary | ICD-10-CM | POA: Diagnosis not present

## 2024-01-05 DIAGNOSIS — I10 Essential (primary) hypertension: Secondary | ICD-10-CM | POA: Diagnosis not present

## 2024-01-06 ENCOUNTER — Encounter (INDEPENDENT_AMBULATORY_CARE_PROVIDER_SITE_OTHER): Admitting: Ophthalmology

## 2024-01-06 DIAGNOSIS — H35342 Macular cyst, hole, or pseudohole, left eye: Secondary | ICD-10-CM

## 2024-01-06 DIAGNOSIS — H35033 Hypertensive retinopathy, bilateral: Secondary | ICD-10-CM

## 2024-01-06 DIAGNOSIS — H34832 Tributary (branch) retinal vein occlusion, left eye, with macular edema: Secondary | ICD-10-CM | POA: Diagnosis not present

## 2024-01-06 DIAGNOSIS — I10 Essential (primary) hypertension: Secondary | ICD-10-CM

## 2024-01-06 DIAGNOSIS — H43813 Vitreous degeneration, bilateral: Secondary | ICD-10-CM | POA: Diagnosis not present

## 2024-01-11 DIAGNOSIS — D631 Anemia in chronic kidney disease: Secondary | ICD-10-CM | POA: Diagnosis not present

## 2024-01-11 DIAGNOSIS — I10 Essential (primary) hypertension: Secondary | ICD-10-CM | POA: Diagnosis not present

## 2024-01-11 DIAGNOSIS — N1832 Chronic kidney disease, stage 3b: Secondary | ICD-10-CM | POA: Diagnosis not present

## 2024-01-11 DIAGNOSIS — E871 Hypo-osmolality and hyponatremia: Secondary | ICD-10-CM | POA: Diagnosis not present

## 2024-01-11 DIAGNOSIS — N2581 Secondary hyperparathyroidism of renal origin: Secondary | ICD-10-CM | POA: Diagnosis not present

## 2024-01-20 ENCOUNTER — Other Ambulatory Visit: Payer: Self-pay | Admitting: *Deleted

## 2024-01-20 DIAGNOSIS — D631 Anemia in chronic kidney disease: Secondary | ICD-10-CM

## 2024-01-24 ENCOUNTER — Encounter: Payer: Self-pay | Admitting: Internal Medicine

## 2024-01-24 ENCOUNTER — Inpatient Hospital Stay: Admitting: Internal Medicine

## 2024-01-24 ENCOUNTER — Inpatient Hospital Stay

## 2024-01-24 VITALS — BP 154/57 | HR 51 | Temp 97.8°F | Resp 12 | Ht 62.0 in | Wt 147.7 lb

## 2024-01-24 DIAGNOSIS — C50412 Malignant neoplasm of upper-outer quadrant of left female breast: Secondary | ICD-10-CM

## 2024-01-24 DIAGNOSIS — D631 Anemia in chronic kidney disease: Secondary | ICD-10-CM

## 2024-01-24 DIAGNOSIS — D649 Anemia, unspecified: Secondary | ICD-10-CM

## 2024-01-24 DIAGNOSIS — N1832 Chronic kidney disease, stage 3b: Secondary | ICD-10-CM | POA: Diagnosis not present

## 2024-01-24 LAB — FERRITIN: Ferritin: 171 ng/mL (ref 11–307)

## 2024-01-24 LAB — BASIC METABOLIC PANEL - CANCER CENTER ONLY
Anion gap: 10 (ref 5–15)
BUN: 31 mg/dL — ABNORMAL HIGH (ref 8–23)
CO2: 21 mmol/L — ABNORMAL LOW (ref 22–32)
Calcium: 8.6 mg/dL — ABNORMAL LOW (ref 8.9–10.3)
Chloride: 102 mmol/L (ref 98–111)
Creatinine: 1.69 mg/dL — ABNORMAL HIGH (ref 0.44–1.00)
GFR, Estimated: 31 mL/min — ABNORMAL LOW (ref >60)
Glucose, Bld: 96 mg/dL (ref 70–99)
Potassium: 4.3 mmol/L (ref 3.5–5.1)
Sodium: 133 mmol/L — ABNORMAL LOW (ref 135–145)

## 2024-01-24 LAB — CBC WITH DIFFERENTIAL (CANCER CENTER ONLY)
Abs Immature Granulocytes: 0.02 K/uL (ref 0.00–0.07)
Basophils Absolute: 0.1 K/uL (ref 0.0–0.1)
Basophils Relative: 1 %
Eosinophils Absolute: 0.3 K/uL (ref 0.0–0.5)
Eosinophils Relative: 4 %
HCT: 31 % — ABNORMAL LOW (ref 36.0–46.0)
Hemoglobin: 10.4 g/dL — ABNORMAL LOW (ref 12.0–15.0)
Immature Granulocytes: 0 %
Lymphocytes Relative: 18 %
Lymphs Abs: 1.1 K/uL (ref 0.7–4.0)
MCH: 34.1 pg — ABNORMAL HIGH (ref 26.0–34.0)
MCHC: 33.5 g/dL (ref 30.0–36.0)
MCV: 101.6 fL — ABNORMAL HIGH (ref 80.0–100.0)
Monocytes Absolute: 0.9 K/uL (ref 0.1–1.0)
Monocytes Relative: 15 %
Neutro Abs: 3.9 K/uL (ref 1.7–7.7)
Neutrophils Relative %: 62 %
Platelet Count: 225 K/uL (ref 150–400)
RBC: 3.05 MIL/uL — ABNORMAL LOW (ref 3.87–5.11)
RDW: 13.8 % (ref 11.5–15.5)
WBC Count: 6.2 K/uL (ref 4.0–10.5)
nRBC: 0 % (ref 0.0–0.2)

## 2024-01-24 LAB — IRON AND TIBC
Iron: 103 ug/dL (ref 28–170)
Saturation Ratios: 36 % — ABNORMAL HIGH (ref 10.4–31.8)
TIBC: 283 ug/dL (ref 250–450)
UIBC: 180 ug/dL

## 2024-01-24 LAB — SAMPLE TO BLOOD BANK

## 2024-01-24 MED ORDER — TAMOXIFEN CITRATE 20 MG PO TABS: 20.0000 mg | ORAL_TABLET | Freq: Every day | ORAL | 1 refills | Status: AC

## 2024-01-24 NOTE — Progress Notes (Signed)
 [facility] CONSULT NOTE  Patient Care Team: Bianca Cheryl BRAVO, MD as PCP - General (Family Medicine) Bianca Evalene PARAS, MD as PCP - Cardiology (Cardiology) Dessa, Bianca ORN, MD (General Surgery) Bianca Shaw, Munsoor, MD as Consulting Physician (Internal Medicine) Bianca Seal, MD as Consulting Physician (Dermatology) Bianca Shaw, OD as Consulting Physician (Optometry) Bianca, Evalene PARAS, MD as Consulting Physician (Cardiology) Bianca Therisa HERO, MD as Physician Assistant (Endocrinology) Bianca Nest, MD as Consulting Physician (General Surgery) Bianca Cindy SAUNDERS, MD as Consulting Physician (Internal Medicine)  CHIEF COMPLAINTS/PURPOSE OF CONSULTATION: Breast cancer   Oncology History Overview Note  Bianca Shaw is a 78 y.o. female with stage IIA (T2N0) left breast cancer s/p mastectomy with sentinel lymph node biopsy on 12/26/2015.   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  Margins were negative.  Two sentinel lymph nodes were negative for macrometastasis, but with isolated tumor cells on IHC stains.  Three additional lymph nodes were positive for isolated tumor cells on IHC.  Tumor was ER positive (> 90%), PR positive (> 90%), and Her2/neu 2+ (eqivocal).  Her2/neu by FISH was negative.  Pathologic stage was pT2 pN0(i+).   MammaPrint testing revealed low risk luminal type A. There was a 97.8% probability of being disease free at 10 years with hormonal therapy.    Exam in 11/2016 revealed a 4 mm nodule in the left axillae above the mastectomy incision.  Excision biopsy on 12/15/2016 revealed fat necrosis and no malignancy.   Carcinoma of upper-outer quadrant of left breast in female, estrogen receptor positive (HCC)  12/06/2015 Initial Diagnosis   Carcinoma of upper-outer quadrant of left breast in female, estrogen receptor positive (HCC)     HISTORY OF PRESENTING ILLNESS: Walking independently.   With her husband.   Bianca Shaw 78 y.o.  female invasive lobular breast cancer T2N1 ER/PR positive HER2 negative on tamoxifen ; [name]  Notes to have improvement of the Energy levels.  Patient denies any unusual hot flashes or joint pains.  No unusual bone pain.  Review of Systems  Constitutional:  Positive for malaise/fatigue. Negative for chills, diaphoresis, fever and weight loss.  HENT:  Negative for nosebleeds and sore throat.   Eyes:  Negative for double vision.  Respiratory:  Negative for cough, hemoptysis, sputum production, shortness of breath and wheezing.   Cardiovascular:  Negative for chest pain, palpitations, orthopnea and leg swelling.  Gastrointestinal:  Negative for abdominal pain, blood in stool, constipation, diarrhea, heartburn, melena, nausea and vomiting.  Genitourinary:  Negative for dysuria, frequency and urgency.  Musculoskeletal:  Positive for joint pain. Negative for back pain.  Skin: Negative.  Negative for itching and rash.  Neurological:  Negative for dizziness, tingling, focal weakness, weakness and headaches.  Endo/Heme/Allergies:  Does not bruise/bleed easily.  Psychiatric/Behavioral:  Negative for depression. The patient is not nervous/anxious and does not have insomnia.      MEDICAL HISTORY:  Past Medical History:  Diagnosis Date   Anemia    Anxiety disorder    Breast cancer (HCC) 12/03/2015   lt breast   Breast cancer of upper-outer quadrant of left female breast (HCC) 11/2015   pT2 pN0(i+).;ER+; PR +, her 2 neu not overexpressed.  Mastectomy, SLN, Mammoprint: Low risk.    Cancer (HCC) 12/03/2015   left breast/ INVASIVE LOBULAR CARCINOMA.    Chronic kidney disease    STAGE 3   COPD (chronic  obstructive pulmonary disease) (HCC)    MILD   Cough    lingering, mild, finished Prednisone  and anitbiotic 11/03/15   Dyspnea    DOE   Family history of adverse reaction to  anesthesia    sister - PONV   GERD (gastroesophageal reflux disease)    Hypertension    Myocardial infarction (HCC)    Osteopenia    Osteoporosis    Pericarditis    diagnonsed June, 2010, unclear etiology as of yer   Personal history of tobacco use, presenting hazards to health 10/31/2015   Scleroderma (HCC)    ONLY ON SKIN-MILD   UTI (lower urinary tract infection)    Wears dentures    full upper    SURGICAL HISTORY: Past Surgical History:  Procedure Laterality Date   BREAST BIOPSY Right 2012   core - neg   BREAST BIOPSY Left 12/03/2015   INVASIVE LOBULAR CARCINOMA.    CARDIAC CATHETERIZATION     CATARACT EXTRACTION W/ INTRAOCULAR LENS IMPLANT Right    CATARACT EXTRACTION W/PHACO Left 11/17/2018   Procedure: CATARACT EXTRACTION PHACO AND INTRAOCULAR LENS PLACEMENT;  Surgeon: Ferol Rogue, MD;  Location: [facility];  Service: Ophthalmology;  Laterality: Left;  US  00:55 CDE 11.04 Fluid Pack Lot # [id number]   COLONOSCOPY WITH PROPOFOL  N/A 11/08/2015   Procedure: COLONOSCOPY WITH PROPOFOL ;  Surgeon: Rogelia Copping, MD;  Location: [facility];  Service: Endoscopy;  Laterality: N/A;   CORONARY ANGIOPLASTY     ESOPHAGOGASTRODUODENOSCOPY (EGD) WITH PROPOFOL  N/A 11/18/2017   Procedure: ESOPHAGOGASTRODUODENOSCOPY (EGD) WITH PROPOFOL ;  Surgeon: Janalyn Keene NOVAK, MD;  Location: [facility];  Service: Endoscopy;  Laterality: N/A;   ESOPHAGOGASTRODUODENOSCOPY (EGD) WITH PROPOFOL  N/A 02/15/2018   Procedure: ESOPHAGOGASTRODUODENOSCOPY (EGD) WITH PROPOFOL ;  Surgeon: Therisa Bi, MD;  Location: [facility];  Service: Gastroenterology;  Laterality: N/A;   ESOPHAGOGASTRODUODENOSCOPY (EGD) WITH PROPOFOL  N/A 05/10/2018   Procedure: ESOPHAGOGASTRODUODENOSCOPY (EGD) WITH BIOPSIES;  Surgeon: Janalyn Keene NOVAK, MD;  Location: [facility];  Service: Endoscopy;  Laterality: N/A;   EVACUATION BREAST HEMATOMA Left 01/14/2016   Procedure: EVACUATION HEMATOMA BREAST;  Surgeon: Bianca LELON Cota, MD;  Location: [facility];  Service: General;  Laterality: Left;   EYE SURGERY     MASTECTOMY Left 2017   complete mastectomy   MASTECTOMY W/ SENTINEL NODE BIOPSY Left 12/26/2015   Procedure: MASTECTOMY WITH SENTINEL LYMPH NODE BIOPSY;  Surgeon: Bianca LELON Cota, MD;  Location: [facility];  Service: General;  Laterality: Left;   RIGHT/LEFT HEART CATH AND CORONARY ANGIOGRAPHY N/A 07/22/2016   Procedure: Right/Left Heart Cath and Coronary Angiography;  Surgeon: Evalene JINNY Lunger, MD;  Location: [facility];  Service: Cardiovascular;  Laterality: N/A;   TUBAL LIGATION     VESICOVAGINAL FISTULA CLOSURE W/ TAH      SOCIAL HISTORY: Social History   Socioeconomic History   Marital status: Married    Spouse name: Not on file   Number of children: 2   Years of education: Not on file   Highest education level: Not on file  Occupational History   Not on file  Tobacco Use   Smoking status: Former    Current packs/day: 0.00    Average packs/day: 0.8 packs/day for 40.0 years (30.0 ttl pk-yrs)    Types: Cigarettes    Start date: 11/07/1963    Quit date: 11/07/2003    Years since quitting: 20.2   Smokeless tobacco: Never  Vaping Use   Vaping status: Never Used  Substance and Sexual Activity  Alcohol use: Yes    Alcohol/week: 1.0 - 3.0 standard drink of alcohol    Types: 1 - 3 Glasses of wine per week    Comment: Occasionally.     Drug use: No   Sexual activity: Not on file  Other Topics Concern   Not on file  Social History Narrative   She drinks 1 to 2 caffeinated beverages a day   Social Drivers of Health   Financial Resource Strain: Not on file  Food Insecurity: No Food Insecurity (05/26/2022)   Received from [facility]   Hunger Vital Sign    Within the past 12 months, you worried that your food would run out before you got the money to buy more.: Never true    Within the past 12 months, the food you bought just didn't last and you didn't have money to get  more.: Never true  Transportation Needs: No Transportation Needs (05/26/2022)   Received from [facility]   PRAPARE - Transportation    Lack of Transportation (Medical): No    Lack of Transportation (Non-Medical): No  Physical Activity: Not on file  Stress: Not on file  Social Connections: Not on file  Intimate Partner Violence: Not on file    FAMILY HISTORY: Family History  Problem Relation Age of Onset   Heart failure Mother    Epilepsy Mother    COPD Father    Heart disease Father    Anxiety disorder Sister    Arthritis Brother    Heart disease Brother    Vaginal cancer Paternal Grandmother    Heart attack Paternal Grandfather    COPD Brother    Kidney failure Brother    COPD Brother    Arthritis Sister    Uterine cancer Other    Diabetes Other    Colon cancer Neg Hx    Stomach cancer Neg Hx    Breast cancer Neg Hx     ALLERGIES:  is allergic to pimenta and tomato.  MEDICATIONS:  Current Outpatient Medications  Medication Sig Dispense Refill   albuterol  (PROVENTIL ) (2.5 MG/3ML) 0.083% nebulizer solution Take 3 mLs (2.5 mg total) by nebulization every 6 (six) hours as needed for wheezing or shortness of breath. 75 mL 12   amLODipine  (NORVASC ) 2.5 MG tablet Take 1 tablet (2.5 mg total) by mouth daily. 90 tablet 3   Cyanocobalamin  (B-12) 5000 MCG CAPS Take 1,000 mcg by mouth daily.      ezetimibe  (ZETIA ) 10 MG tablet Take 1 tablet (10 mg total) by mouth daily. 90 tablet 3   Ferrous Sulfate (IRON  PO) Take 65 mg of iron  by mouth. Taking 1 tablet daily     fluticasone  (FLONASE ) 50 MCG/ACT nasal spray Place 2 sprays into both nostrils daily. 16 g 0   furosemide  (LASIX ) 20 MG tablet Take 1 tablet (20 mg total) by mouth daily. 90 tablet 3   hydrALAZINE  (APRESOLINE ) 25 MG tablet Take 1 tablet (25 mg total) by mouth 2 (two) times daily. 180 tablet 2   lisinopril  (ZESTRIL ) 20 MG tablet Take 2 tablets (40 mg total) by mouth daily. 180 tablet 3   metoprolol  succinate  (TOPROL -XL) 25 MG 24 hr tablet Take 1 tablet (25 mg total) by mouth daily. 90 tablet 3   nitroGLYCERIN  (NITROSTAT ) 0.4 MG SL tablet Place 1 tablet (0.4 mg total) under the tongue every 5 (five) minutes as needed for chest pain. 30 tablet 3   pantoprazole  (PROTONIX ) 40 MG tablet Take by mouth.  potassium chloride  SA (KLOR-CON  M) 20 MEQ tablet Take 1 tablet (20 mEq total) by mouth daily. 90 tablet 3   simvastatin  (ZOCOR ) 40 MG tablet Take 1 tablet (40 mg total) by mouth daily. 90 tablet 3   sodium chloride  1 g tablet Take 1 g by mouth daily.     tamoxifen  (NOLVADEX ) 20 MG tablet Take 1 tablet (20 mg total) by mouth daily. 90 tablet 1   No current facility-administered medications for this visit.      SABRA  PHYSICAL EXAMINATION: ECOG PERFORMANCE STATUS: 1 - Symptomatic but completely ambulatory  Vitals:   01/24/24 1435 01/24/24 1505  BP: (!) 163/58 (!) 154/57  Pulse: (!) 51   Resp: 12   Temp: 97.8 F (36.6 C)   SpO2: 100%     Filed Weights   01/24/24 1435  Weight: 147 lb 11.2 oz (67 kg)     Physical Exam Vitals and nursing note reviewed.  Constitutional:      Comments:      HENT:     Head: Normocephalic and atraumatic.     Mouth/Throat:     Pharynx: Oropharynx is clear.  Eyes:     Extraocular Movements: Extraocular movements intact.     Pupils: Pupils are equal, round, and reactive to light.  Cardiovascular:     Rate and Rhythm: Normal rate and regular rhythm.  Pulmonary:     Comments: Decreased breath sounds bilaterally.  Abdominal:     Palpations: Abdomen is soft.  Musculoskeletal:        General: Normal range of motion.     Cervical back: Normal range of motion.  Skin:    General: Skin is warm.  Neurological:     General: No focal deficit present.     Mental Status: She is alert and oriented to person, place, and time.  Psychiatric:        Behavior: Behavior normal.        Judgment: Judgment normal.      LABORATORY DATA:  I have reviewed the data  as listed Lab Results  Component Value Date   WBC 6.2 01/24/2024   HGB 10.4 (L) 01/24/2024   HCT 31.0 (L) 01/24/2024   MCV 101.6 (H) 01/24/2024   PLT 225 01/24/2024   Recent Labs    08/10/23 1402 11/02/23 1448 01/24/24 1436  NA 132* 129* PENDING  K 4.0 4.6 PENDING  CL 101 101 PENDING  CO2 23 21* PENDING  GLUCOSE 106* 101* 96  BUN 26* 41* 31*  CREATININE 1.43* 1.78* 1.69*  CALCIUM 8.7* 9.3 8.6*  GFRNONAA 38* 29* 31*    RADIOGRAPHIC STUDIES: I have personally reviewed the radiological images as listed and agreed with the findings in the report. No results found.   ASSESSMENT & PLAN:   Symptomatic anemia # Moderate macrocytic anemia secondary ?CKD [s/p bone marrow biopsy-March 2019; dr.C-nonspecific]; MAY 2024- b12- elevated.   # Today hemoglobin: 10.4  on Retacrit / Venofer .   Recommend Gentle iron .   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 Ext Tam]; clinically no evidence of recurrence.  Continue tamoxifen  for now [until 2027].  Stable-  aug 2025- mammo-WNL. Refilled.   # Osteopenia  AUG 2024- he BMD  T-score of -2.0.-continue Fosamax  calcium plus vitamin D . 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  stable  #DISPOSITION: #  HOLD  Retacrit  today; HOLD Venofer  today.  # In 4 weeks- labs H&H- possible Retacrit  # in 8 weeks- labs H&H- possible Retacrit  # in 12  weeks- labs H&H- possible Retacrit  # Follow-up 16 weeks-MD  labs CBC BMP; iron  studies; ferritin-possible Retacrit  or Venofer - - Dr.B     All questions were answered. The patient knows to call the clinic with any problems, questions or concerns.    Cindy JONELLE Joe, MD 01/24/2024 3:45 PM

## 2024-01-24 NOTE — Progress Notes (Signed)
 Fatigue/weakness: at times Dyspena: no  Light headedness: no  Blood in stool: no  Needs refill tamoxifen , pended.  Having lt eye injection 02/03/24 for swelling. 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

## 2024-01-24 NOTE — Assessment & Plan Note (Addendum)
#   Moderate macrocytic anemia secondary ?CKD [s/p bone marrow biopsy-March 2019; dr.C-nonspecific]; MAY 2024- b12- elevated.   # Today hemoglobin: 10.4  on Retacrit / Venofer .   Recommend Gentle iron .   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 Ext Tam]; clinically no evidence of recurrence.  Continue tamoxifen  for now [until 2027].  Stable-  aug 2025- mammo-WNL. Refilled.   # Osteopenia  AUG 2024- he BMD  T-score of -2.0.-continue Fosamax  calcium plus vitamin D . 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  stable  #DISPOSITION: #  HOLD Retacrit  today; HOLD Venofer  today.  # In 4 weeks- labs H&H- possible Retacrit  # in 8 weeks- labs H&H- possible Retacrit  # in 12  weeks- labs H&H- possible Retacrit  # Follow-up 16 weeks-MD  labs CBC BMP; iron  studies; ferritin-possible Retacrit  or Venofer - - Dr.B

## 2024-01-24 NOTE — Patient Instructions (Signed)
#  Recommend gentle iron  [iron  biglycinate; 28 mg ] 1 pill a day.  This pill is unlikely to cause stomach upset or cause constipation.  Available Over the counter or talk to pharmacist.

## 2024-01-25 DIAGNOSIS — N1832 Chronic kidney disease, stage 3b: Secondary | ICD-10-CM | POA: Diagnosis not present

## 2024-01-25 DIAGNOSIS — J449 Chronic obstructive pulmonary disease, unspecified: Secondary | ICD-10-CM | POA: Diagnosis not present

## 2024-01-25 DIAGNOSIS — C50412 Malignant neoplasm of upper-outer quadrant of left female breast: Secondary | ICD-10-CM | POA: Diagnosis not present

## 2024-01-25 DIAGNOSIS — N2581 Secondary hyperparathyroidism of renal origin: Secondary | ICD-10-CM | POA: Diagnosis not present

## 2024-01-25 DIAGNOSIS — M349 Systemic sclerosis, unspecified: Secondary | ICD-10-CM | POA: Diagnosis not present

## 2024-01-25 DIAGNOSIS — I25119 Atherosclerotic heart disease of native coronary artery with unspecified angina pectoris: Secondary | ICD-10-CM | POA: Diagnosis not present

## 2024-01-25 DIAGNOSIS — Z0000 Encounter for general adult medical examination without abnormal findings: Secondary | ICD-10-CM | POA: Diagnosis not present

## 2024-01-25 DIAGNOSIS — R7302 Impaired glucose tolerance (oral): Secondary | ICD-10-CM | POA: Diagnosis not present

## 2024-01-25 DIAGNOSIS — I10 Essential (primary) hypertension: Secondary | ICD-10-CM | POA: Diagnosis not present

## 2024-01-25 DIAGNOSIS — E7800 Pure hypercholesterolemia, unspecified: Secondary | ICD-10-CM | POA: Diagnosis not present

## 2024-01-25 DIAGNOSIS — E871 Hypo-osmolality and hyponatremia: Secondary | ICD-10-CM | POA: Diagnosis not present

## 2024-01-26 DIAGNOSIS — E7800 Pure hypercholesterolemia, unspecified: Secondary | ICD-10-CM | POA: Diagnosis not present

## 2024-01-26 DIAGNOSIS — R7302 Impaired glucose tolerance (oral): Secondary | ICD-10-CM | POA: Diagnosis not present

## 2024-02-03 ENCOUNTER — Encounter (INDEPENDENT_AMBULATORY_CARE_PROVIDER_SITE_OTHER): Admitting: Ophthalmology

## 2024-02-03 DIAGNOSIS — H35342 Macular cyst, hole, or pseudohole, left eye: Secondary | ICD-10-CM

## 2024-02-03 DIAGNOSIS — H43813 Vitreous degeneration, bilateral: Secondary | ICD-10-CM

## 2024-02-03 DIAGNOSIS — H35033 Hypertensive retinopathy, bilateral: Secondary | ICD-10-CM

## 2024-02-03 DIAGNOSIS — H34832 Tributary (branch) retinal vein occlusion, left eye, with macular edema: Secondary | ICD-10-CM

## 2024-02-03 DIAGNOSIS — H35372 Puckering of macula, left eye: Secondary | ICD-10-CM | POA: Diagnosis not present

## 2024-02-03 DIAGNOSIS — I10 Essential (primary) hypertension: Secondary | ICD-10-CM | POA: Diagnosis not present

## 2024-02-21 ENCOUNTER — Inpatient Hospital Stay: Attending: Internal Medicine

## 2024-02-21 ENCOUNTER — Inpatient Hospital Stay

## 2024-02-21 VITALS — BP 152/78 | HR 59

## 2024-02-21 DIAGNOSIS — Z170 Estrogen receptor positive status [ER+]: Secondary | ICD-10-CM | POA: Insufficient documentation

## 2024-02-21 DIAGNOSIS — D649 Anemia, unspecified: Secondary | ICD-10-CM

## 2024-02-21 DIAGNOSIS — D631 Anemia in chronic kidney disease: Secondary | ICD-10-CM

## 2024-02-21 DIAGNOSIS — Z1721 Progesterone receptor positive status: Secondary | ICD-10-CM | POA: Diagnosis not present

## 2024-02-21 DIAGNOSIS — Z79810 Long term (current) use of selective estrogen receptor modulators (SERMs): Secondary | ICD-10-CM | POA: Diagnosis not present

## 2024-02-21 DIAGNOSIS — N1832 Chronic kidney disease, stage 3b: Secondary | ICD-10-CM | POA: Insufficient documentation

## 2024-02-21 DIAGNOSIS — C50412 Malignant neoplasm of upper-outer quadrant of left female breast: Secondary | ICD-10-CM | POA: Diagnosis not present

## 2024-02-21 LAB — HEMOGLOBIN AND HEMATOCRIT (CANCER CENTER ONLY)
HCT: 29.2 % — ABNORMAL LOW (ref 36.0–46.0)
Hemoglobin: 9.7 g/dL — ABNORMAL LOW (ref 12.0–15.0)

## 2024-02-21 LAB — SAMPLE TO BLOOD BANK

## 2024-02-21 MED ORDER — EPOETIN ALFA-EPBX 20000 UNIT/ML IJ SOLN
20000.0000 [IU] | Freq: Once | INTRAMUSCULAR | Status: AC
  Administered 2024-02-21: 20000 [IU] via SUBCUTANEOUS
  Filled 2024-02-21: qty 1

## 2024-03-02 ENCOUNTER — Encounter (INDEPENDENT_AMBULATORY_CARE_PROVIDER_SITE_OTHER): Admitting: Ophthalmology

## 2024-03-02 DIAGNOSIS — H34832 Tributary (branch) retinal vein occlusion, left eye, with macular edema: Secondary | ICD-10-CM | POA: Diagnosis not present

## 2024-03-02 DIAGNOSIS — H35033 Hypertensive retinopathy, bilateral: Secondary | ICD-10-CM | POA: Diagnosis not present

## 2024-03-02 DIAGNOSIS — H43813 Vitreous degeneration, bilateral: Secondary | ICD-10-CM

## 2024-03-02 DIAGNOSIS — I10 Essential (primary) hypertension: Secondary | ICD-10-CM | POA: Diagnosis not present

## 2024-03-20 ENCOUNTER — Inpatient Hospital Stay: Attending: Internal Medicine

## 2024-03-20 ENCOUNTER — Inpatient Hospital Stay

## 2024-03-20 DIAGNOSIS — D649 Anemia, unspecified: Secondary | ICD-10-CM

## 2024-03-20 DIAGNOSIS — Z170 Estrogen receptor positive status [ER+]: Secondary | ICD-10-CM | POA: Insufficient documentation

## 2024-03-20 DIAGNOSIS — C50412 Malignant neoplasm of upper-outer quadrant of left female breast: Secondary | ICD-10-CM | POA: Insufficient documentation

## 2024-03-20 DIAGNOSIS — D631 Anemia in chronic kidney disease: Secondary | ICD-10-CM | POA: Diagnosis not present

## 2024-03-20 DIAGNOSIS — N1832 Chronic kidney disease, stage 3b: Secondary | ICD-10-CM | POA: Insufficient documentation

## 2024-03-20 LAB — SAMPLE TO BLOOD BANK

## 2024-03-20 LAB — HEMOGLOBIN AND HEMATOCRIT (CANCER CENTER ONLY)
HCT: 32.6 % — ABNORMAL LOW (ref 36.0–46.0)
Hemoglobin: 11.1 g/dL — ABNORMAL LOW (ref 12.0–15.0)

## 2024-03-20 NOTE — Progress Notes (Signed)
 Patient hgb is 11.1; hold retacrit  per parameters

## 2024-03-29 ENCOUNTER — Encounter (INDEPENDENT_AMBULATORY_CARE_PROVIDER_SITE_OTHER): Admitting: Ophthalmology

## 2024-03-29 DIAGNOSIS — H35372 Puckering of macula, left eye: Secondary | ICD-10-CM | POA: Diagnosis not present

## 2024-03-29 DIAGNOSIS — H43813 Vitreous degeneration, bilateral: Secondary | ICD-10-CM | POA: Diagnosis not present

## 2024-03-29 DIAGNOSIS — I10 Essential (primary) hypertension: Secondary | ICD-10-CM

## 2024-03-29 DIAGNOSIS — H35033 Hypertensive retinopathy, bilateral: Secondary | ICD-10-CM

## 2024-03-29 DIAGNOSIS — H34832 Tributary (branch) retinal vein occlusion, left eye, with macular edema: Secondary | ICD-10-CM

## 2024-04-17 ENCOUNTER — Other Ambulatory Visit: Payer: Self-pay

## 2024-04-17 ENCOUNTER — Inpatient Hospital Stay: Attending: Internal Medicine

## 2024-04-17 ENCOUNTER — Inpatient Hospital Stay

## 2024-04-17 DIAGNOSIS — M8580 Other specified disorders of bone density and structure, unspecified site: Secondary | ICD-10-CM | POA: Diagnosis not present

## 2024-04-17 DIAGNOSIS — D631 Anemia in chronic kidney disease: Secondary | ICD-10-CM

## 2024-04-17 DIAGNOSIS — Z79810 Long term (current) use of selective estrogen receptor modulators (SERMs): Secondary | ICD-10-CM | POA: Diagnosis not present

## 2024-04-17 DIAGNOSIS — Z1732 Human epidermal growth factor receptor 2 negative status: Secondary | ICD-10-CM | POA: Diagnosis not present

## 2024-04-17 DIAGNOSIS — N1832 Chronic kidney disease, stage 3b: Secondary | ICD-10-CM | POA: Insufficient documentation

## 2024-04-17 DIAGNOSIS — Z1721 Progesterone receptor positive status: Secondary | ICD-10-CM | POA: Diagnosis not present

## 2024-04-17 DIAGNOSIS — C50412 Malignant neoplasm of upper-outer quadrant of left female breast: Secondary | ICD-10-CM | POA: Insufficient documentation

## 2024-04-17 DIAGNOSIS — Z170 Estrogen receptor positive status [ER+]: Secondary | ICD-10-CM | POA: Diagnosis not present

## 2024-04-17 DIAGNOSIS — D649 Anemia, unspecified: Secondary | ICD-10-CM

## 2024-04-17 LAB — HEMOGLOBIN AND HEMATOCRIT (CANCER CENTER ONLY)
HCT: 31.4 % — ABNORMAL LOW (ref 36.0–46.0)
Hemoglobin: 10.5 g/dL — ABNORMAL LOW (ref 12.0–15.0)

## 2024-04-17 LAB — ABO/RH: ABO/RH(D): A POS

## 2024-04-17 LAB — SAMPLE TO BLOOD BANK

## 2024-04-17 NOTE — Progress Notes (Signed)
 Hgb 10.5 today, no inj given.

## 2024-05-03 ENCOUNTER — Encounter (INDEPENDENT_AMBULATORY_CARE_PROVIDER_SITE_OTHER): Admitting: Ophthalmology

## 2024-05-03 DIAGNOSIS — I10 Essential (primary) hypertension: Secondary | ICD-10-CM | POA: Diagnosis not present

## 2024-05-03 DIAGNOSIS — H43813 Vitreous degeneration, bilateral: Secondary | ICD-10-CM

## 2024-05-03 DIAGNOSIS — H34832 Tributary (branch) retinal vein occlusion, left eye, with macular edema: Secondary | ICD-10-CM | POA: Diagnosis not present

## 2024-05-03 DIAGNOSIS — H35033 Hypertensive retinopathy, bilateral: Secondary | ICD-10-CM

## 2024-05-05 ENCOUNTER — Ambulatory Visit: Admitting: Cardiology

## 2024-05-08 ENCOUNTER — Ambulatory Visit: Admitting: Cardiology

## 2024-05-08 ENCOUNTER — Encounter: Payer: Self-pay | Admitting: Cardiology

## 2024-05-08 VITALS — BP 148/64 | HR 53 | Ht 61.0 in | Wt 147.8 lb

## 2024-05-08 DIAGNOSIS — Z853 Personal history of malignant neoplasm of breast: Secondary | ICD-10-CM | POA: Diagnosis not present

## 2024-05-08 DIAGNOSIS — R011 Cardiac murmur, unspecified: Secondary | ICD-10-CM

## 2024-05-08 DIAGNOSIS — I25110 Atherosclerotic heart disease of native coronary artery with unstable angina pectoris: Secondary | ICD-10-CM | POA: Diagnosis not present

## 2024-05-08 DIAGNOSIS — E7800 Pure hypercholesterolemia, unspecified: Secondary | ICD-10-CM

## 2024-05-08 DIAGNOSIS — I10 Essential (primary) hypertension: Secondary | ICD-10-CM

## 2024-05-08 DIAGNOSIS — N1832 Chronic kidney disease, stage 3b: Secondary | ICD-10-CM

## 2024-05-08 NOTE — Patient Instructions (Signed)
 Medication Instructions:  Your physician recommends that you continue on your current medications as directed. Please refer to the Current Medication list given to you today.   *If you need a refill on your cardiac medications before your next appointment, please call your pharmacy*  Lab Work: No labs ordered today  If you have labs (blood work) drawn today and your tests are completely normal, you will receive your results only by: MyChart Message (if you have MyChart) OR A paper copy in the mail If you have any lab test that is abnormal or we need to change your treatment, we will call you to review the results.  Testing/Procedures: No test ordered today   Follow-Up: At [facility], you and your health needs are our priority.  As part of our continuing mission to provide you with exceptional heart care, our providers are all part of one team.  This team includes your primary Cardiologist (physician) and Advanced Practice Providers or APPs (Physician Assistants and Nurse Practitioners) who all work together to provide you with the care you need, when you need it.  Your next appointment:   12 month(s)  Provider:   You may see Timothy Gollan, MD or one of the following Advanced Practice Providers on your designated Care Team:   Tylene Lunch, NP

## 2024-05-08 NOTE — Progress Notes (Signed)
 " Cardiology Office Note   Date:  05/08/2024  ID:  ANU STAGNER, DOB [birth year], MRN [id number] PCP: Jeffie Cheryl BRAVO, MD   HeartCare Providers Cardiologist:  Evalene Lunger, MD     History of Present Illness Bianca Shaw is a 79 y.o. female with a past medical history of pericarditis, coronary disease with NSTEMI (07/15/2016), hypertension, hyponatremia, GERD, COPD, breast cancer, anxiety, anemia, smoking history, hypercholesterolemia, scleroderma, CKD stage III, is here for follow-up.   She was originally seen by Dr. Gollan in 2018 where previously been hospitalized and had elevated troponin in the setting of left-sided chest pain.  During her hospitalization she remained chest pain free for over 24 hours, but was still recovering from a GI bug.  It was decided that she would return for outpatient heart catheterization.  Cath was done 07/22/2016 which revealed no hemodynamically significant stenosis, normal right heart pressures, diffuse calcified plaque, predominantly in the LAD, normal LV function with ejection fraction greater than 55%.  Echocardiogram revealed LVEF 60-65%, normal wall motion, mild to moderate mitral regurgitation, the left atrium was mildly dilated.   She was last seen in clinic 05/17/2023 stating that she was doing well from a cardiac perspective.  Unfortunately she had been fighting a cold for the last several weeks at home and had taken a home COVID test that was negative.  She was having fevers primarily in the evening associated with chills.  Occasional chest tightness that was pulling when she was coughing but nothing with chest pain or typical shortness of breath.  She did continue to monitor pressures at home and her pressures were within room 130s.  With a significant murmur found on exam she was scheduled for an echocardiogram.   She returns to clinic today stating that she has been doing well from a cardiac perspective.  She denies any chest pain,  shortness of breath, peripheral edema.  States that she has been compliant with her current medication regimen with exception of sodium tablets that she self discontinued this.  She stated that they were making her sick to her stomach and now she incorporates an increased amount of salt into what she eats and drinks daily.  She denies any hospitalizations or visits to the emergency department.  ROS: 10 point review of systems has been reviewed and considered negative the exception was been listed in the HPI  Studies Reviewed EKG Interpretation Date/Time:  Monday May 08 2024 10:09:55 EST Ventricular Rate:  53 PR Interval:  202 QRS Duration:  80 QT Interval:  446 QTC Calculation: 418 R Axis:   -5  Text Interpretation: Sinus bradycardia Minimal voltage criteria for LVH, may be normal variant ( R in aVL ) When compared with ECG of 30-Apr-2023 13:31, No significant change was found Confirmed by Gerard Frederick ([id number]) on 05/08/2024 10:13:26 AM    2d echo 05/18/2023 1. Left ventricular ejection fraction, by estimation, is 60 to 65%. The  left ventricle has normal function. The left ventricle has no regional  wall motion abnormalities. Left ventricular diastolic parameters were  normal.   2. Right ventricular systolic function is normal. The right ventricular  size is normal. Tricuspid regurgitation signal is inadequate for assessing  PA pressure.   3. The mitral valve is normal in structure. Mild mitral valve  regurgitation. No evidence of mitral stenosis.   4. The aortic valve is normal in structure. There is mild calcification  of the aortic valve. Aortic valve regurgitation is mild. Aortic valve  sclerosis is present, with no evidence of aortic valve stenosis.   5. The inferior vena cava is normal in size with greater than 50%  respiratory variability, suggesting right atrial pressure of 3 mmHg.   LHC 07/22/2016 The left ventricular ejection fraction is greater than 65% by visual  estimate. The left ventricular systolic function is normal. LV end diastolic pressure is normal. There is no aortic valve stenosis. There is no mitral valve stenosis and no mitral valve prolapse evident. There is no aortic valve regurgitation. LV end diastolic pressure is normal.   2D echo 07/16/2016 Left ventricle: The cavity size was normal. Systolic function was    normal. The estimated ejection fraction was in the range of 60%    to 65%. Wall motion was normal; there were no regional wall    motion abnormalities. Left ventricular diastolic function    parameters were normal.  - Mitral valve: There was mild to moderate regurgitation.  - Left atrium: The atrium was mildly dilated.  - Right ventricle: Systolic function was normal.  - Pulmonary arteries: Systolic pressure was moderately elevated. PA    peak pressure: 53 mm Hg (S).   Risk Assessment/Calculations     Physical Exam VS:  BP (!) 148/64 (BP Location: Right Arm, Patient Position: Sitting, Cuff Size: Normal)   Pulse (!) 53   Ht 5' 1 (1.549 m)   Wt 147 lb 12.8 oz (67 kg)   SpO2 98%   BMI 27.93 kg/m        Wt Readings from Last 3 Encounters:  05/08/24 147 lb 12.8 oz (67 kg)  01/24/24 147 lb 11.2 oz (67 kg)  11/02/23 145 lb 11.2 oz (66.1 kg)    GEN: Well nourished, well developed in no acute distress NECK: No JVD; No carotid bruits CARDIAC: RRR, bradycardic, II/VI systolic murmur RUSB, without rubs or gallops RESPIRATORY:  Clear to auscultation without rales, wheezing or rhonchi  ABDOMEN: Soft, non-tender, non-distended EXTREMITIES:  No edema; No deformity   ASSESSMENT AND PLAN Essential hypertension with a blood pressure today of 138/64.  She has not had any of her medications as of yet.  She has been continued on her current medication regimen of amlodipine  2.5 mg daily, furosemide  20 mg daily, hydralazine  25 mg twice daily, Toprol -XL 25 mg daily.  She has been encouraged to continue to monitor pressures 1 to 2  hours postmedication administration at home as well.  Coronary artery disease status post NSTEMI with nonobstructive lesions found on cardiac catheterization in 06/2016.  She has been continued on ezetimibe  and simvastatin .  EKG today reveals sinus bradycardia with rate of 53 with LVH with no acute ischemic changes.  No further ischemic evaluation is needed at this time.  Hypercholesterolemia with last LDL 48 which has been well-controlled and below goal continuing simvastatin  40 mg daily and ezetimibe  10 mg daily.  Heart murmur noted on examination with most recent echocardiogram revealed an LVEF of 60 to 65%, no RWMA, mild mitral regurgitation and mild calcification of the aortic valve with mild aortic regurgitation aortic valve sclerosis without stenosis.  Will continue to monitor with surveillance studies.  History of breast cancer which she continues to follow with hematology oncology.  CKD stage III with last serum creatinine of 1.3 01/26/2024.  Serum creatinine has remained stable at baseline.  She continues to follow with nephrology.       Dispo: Patient to return to clinic to see MD/APP in 11 to 12 months or sooner if needed  for further evaluation.  Signed, Jodeci Roarty, NP   "

## 2024-05-15 ENCOUNTER — Inpatient Hospital Stay: Attending: Internal Medicine

## 2024-05-15 ENCOUNTER — Inpatient Hospital Stay

## 2024-05-15 ENCOUNTER — Encounter: Payer: Self-pay | Admitting: Internal Medicine

## 2024-05-15 ENCOUNTER — Inpatient Hospital Stay: Admitting: Internal Medicine

## 2024-05-15 VITALS — BP 141/67 | HR 55 | Temp 98.2°F | Resp 16 | Ht 61.0 in | Wt 148.9 lb

## 2024-05-15 DIAGNOSIS — D649 Anemia, unspecified: Secondary | ICD-10-CM

## 2024-05-15 DIAGNOSIS — D631 Anemia in chronic kidney disease: Secondary | ICD-10-CM

## 2024-05-15 DIAGNOSIS — C50412 Malignant neoplasm of upper-outer quadrant of left female breast: Secondary | ICD-10-CM

## 2024-05-15 LAB — CBC WITH DIFFERENTIAL (CANCER CENTER ONLY)
Abs Immature Granulocytes: 0.02 K/uL (ref 0.00–0.07)
Basophils Absolute: 0.1 K/uL (ref 0.0–0.1)
Basophils Relative: 1 %
Eosinophils Absolute: 0.4 K/uL (ref 0.0–0.5)
Eosinophils Relative: 5 %
HCT: 31 % — ABNORMAL LOW (ref 36.0–46.0)
Hemoglobin: 10.6 g/dL — ABNORMAL LOW (ref 12.0–15.0)
Immature Granulocytes: 0 %
Lymphocytes Relative: 18 %
Lymphs Abs: 1.2 K/uL (ref 0.7–4.0)
MCH: 34.6 pg — ABNORMAL HIGH (ref 26.0–34.0)
MCHC: 34.2 g/dL (ref 30.0–36.0)
MCV: 101.3 fL — ABNORMAL HIGH (ref 80.0–100.0)
Monocytes Absolute: 1.1 K/uL — ABNORMAL HIGH (ref 0.1–1.0)
Monocytes Relative: 16 %
Neutro Abs: 4.1 K/uL (ref 1.7–7.7)
Neutrophils Relative %: 60 %
Platelet Count: 219 K/uL (ref 150–400)
RBC: 3.06 MIL/uL — ABNORMAL LOW (ref 3.87–5.11)
RDW: 13.2 % (ref 11.5–15.5)
WBC Count: 6.8 K/uL (ref 4.0–10.5)
nRBC: 0 % (ref 0.0–0.2)

## 2024-05-15 LAB — CMP (CANCER CENTER ONLY)
ALT: 11 U/L (ref 0–44)
AST: 20 U/L (ref 15–41)
Albumin: 4.2 g/dL (ref 3.5–5.0)
Alkaline Phosphatase: 49 U/L (ref 38–126)
Anion gap: 9 (ref 5–15)
BUN: 26 mg/dL — ABNORMAL HIGH (ref 8–23)
CO2: 26 mmol/L (ref 22–32)
Calcium: 9.6 mg/dL (ref 8.9–10.3)
Chloride: 98 mmol/L (ref 98–111)
Creatinine: 1.47 mg/dL — ABNORMAL HIGH (ref 0.44–1.00)
GFR, Estimated: 36 mL/min — ABNORMAL LOW
Glucose, Bld: 108 mg/dL — ABNORMAL HIGH (ref 70–99)
Potassium: 4.7 mmol/L (ref 3.5–5.1)
Sodium: 132 mmol/L — ABNORMAL LOW (ref 135–145)
Total Bilirubin: <0.2 mg/dL (ref 0.0–1.2)
Total Protein: 7.9 g/dL (ref 6.5–8.1)

## 2024-05-15 LAB — IRON AND TIBC
Iron: 84 ug/dL (ref 28–170)
Saturation Ratios: 30 % (ref 10.4–31.8)
TIBC: 283 ug/dL (ref 250–450)
UIBC: 198 ug/dL

## 2024-05-15 LAB — FERRITIN: Ferritin: 279 ng/mL (ref 11–307)

## 2024-05-15 LAB — SAMPLE TO BLOOD BANK

## 2024-05-15 MED ORDER — EPOETIN ALFA-EPBX 20000 UNIT/ML IJ SOLN
20000.0000 [IU] | Freq: Once | INTRAMUSCULAR | Status: AC
  Administered 2024-05-15: 20000 [IU] via SUBCUTANEOUS
  Filled 2024-05-15: qty 1

## 2024-05-15 NOTE — Assessment & Plan Note (Addendum)
#   Moderate macrocytic anemia secondary ?CKD [s/p bone marrow biopsy-March 2019; dr.C-nonspecific]; MAY 2024- b12- elevated.   # Today hemoglobin: 10.4  on Retacrit / Venofer .   Recommend Gentle iron .   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 Ext Tam]; clinically no evidence of recurrence.  Continue tamoxifen  for now [until summer 2027].  Stable-  aug 2025- mammo-WNL.  Stable.   # Osteopenia  AUG 2024- he BMD  T-score of -2.0.-continue Fosamax  calcium plus vitamin D . Stable.   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  Stable.   #DISPOSITION: #  Retacrit  today; HOLD Venofer  today.  # In 4 weeks- labs H&H- possible Retacrit  # in 8 weeks- labs H&H- possible Retacrit  # in 12  weeks- labs H&H- possible Retacrit  # Follow-up 16 weeks-MD  labs CBC BMP; iron  studies; ferritin-possible Retacrit  or Venofer - - Dr.B

## 2024-05-15 NOTE — Progress Notes (Signed)
 [facility] CONSULT NOTE  Patient Care Team: Jeffie Cheryl BRAVO, MD as PCP - General (Family Medicine) Perla Evalene PARAS, MD as PCP - Cardiology (Cardiology) Dessa, Reyes ORN, MD (General Surgery) Lateef, Munsoor, MD as Consulting Physician (Internal Medicine) Cathlyn Seal, MD as Consulting Physician (Dermatology) Mevelyn JONETTA Bathe, OD as Consulting Physician (Optometry) Perla, Evalene PARAS, MD as Consulting Physician (Cardiology) Damian Therisa HERO, MD as Physician Assistant (Endocrinology) Marolyn Nest, MD as Consulting Physician (General Surgery) Rennie Cindy SAUNDERS, MD as Consulting Physician (Internal Medicine) Gerard Frederick, NP as Nurse Practitioner (Cardiology)  CHIEF COMPLAINTS/PURPOSE OF CONSULTATION:Anemia of chronic kidney disease; breast cancer   Oncology History Overview Note  Bianca Shaw is a 79 y.o. female with stage IIA (T2N0) left breast cancer s/p mastectomy with sentinel lymph node biopsy on 12/26/2015.   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  Margins were negative.  Two sentinel lymph nodes were negative for macrometastasis, but with isolated tumor cells on IHC stains.  Three additional lymph nodes were positive for isolated tumor cells on IHC.  Tumor was ER positive (> 90%), PR positive (> 90%), and Her2/neu 2+ (eqivocal).  Her2/neu by FISH was negative.  Pathologic stage was pT2 pN0(i+).   MammaPrint testing revealed low risk luminal type A. There was a 97.8% probability of being disease free at 10 years with hormonal therapy.    Exam in 11/2016 revealed a 4 mm nodule in the left axillae above the mastectomy incision.  Excision biopsy on 12/15/2016 revealed fat necrosis and no malignancy.   Carcinoma of upper-outer quadrant of left breast in female, estrogen receptor positive (HCC)  12/06/2015 Initial Diagnosis   Carcinoma of upper-outer quadrant of left breast in female, estrogen  receptor positive (HCC)     HISTORY OF PRESENTING ILLNESS: Walking independently.  With her husband.   [name] 79 y.o.  female invasive lobular breast cancer T2N1 ER/PR positive HER2 negative on tamoxifen ; [name]  Discussed the use of AI scribe software for clinical note transcription with the patient, who gave verbal consent to proceed.  History of Present Illness   Bianca Shaw is a 79 year old female with stage 3 chronic kidney disease-associated anemia and breast cancer on tamoxifen  who presents for follow-up of anemia and ongoing cancer therapy.  She is managed for anemia secondary to chronic kidney disease with epoetin  alfa and oral iron  supplementation. Hemoglobin levels have remained stable between 10 and 11 g/dL, with the most recent value at 10.6 g/dL. She has not required iron  infusions for an extended period. She denies chest pain, dyspnea, or other systemic symptoms. Recent creatinine clearance was reported as 33, similar to prior measurements.  She continues tamoxifen  therapy for breast cancer with one year remaining in her planned course. She reports taking tamoxifen  as prescribed and has not mentioned any adverse effects. She has no new breast symptoms or concerns.       Review of Systems  Constitutional:  Positive for malaise/fatigue. Negative for chills, diaphoresis, fever and weight loss.  HENT:  Negative for nosebleeds and sore throat.   Eyes:  Negative for double vision.  Respiratory:  Negative for cough, hemoptysis, sputum production, shortness of breath and wheezing.   Cardiovascular:  Negative for chest pain, palpitations, orthopnea and leg swelling.  Gastrointestinal:  Negative for abdominal pain, blood in stool, constipation, diarrhea, heartburn, melena, nausea and vomiting.  Genitourinary:  Negative for dysuria, frequency and urgency.  Musculoskeletal:  Positive for joint  pain. Negative for back pain.  Skin: Negative.  Negative for itching and rash.  Neurological:  Negative for dizziness, tingling, focal weakness, weakness and headaches.  Endo/Heme/Allergies:  Does not bruise/bleed easily.  Psychiatric/Behavioral:  Negative for depression. The patient is not nervous/anxious and does not have insomnia.      MEDICAL HISTORY:  Past Medical History:  Diagnosis Date   Anemia    Anxiety disorder    Breast cancer (HCC) 12/03/2015   lt breast   Breast cancer of upper-outer quadrant of left female breast (HCC) 11/2015   pT2 pN0(i+).;ER+; PR +, her 2 neu not overexpressed.  Mastectomy, SLN, Mammoprint: Low risk.    Cancer (HCC) 12/03/2015   left breast/ INVASIVE LOBULAR CARCINOMA.    Chronic kidney disease    STAGE 3   COPD (chronic obstructive pulmonary disease) (HCC)    MILD   Cough    lingering, mild, finished Prednisone  and anitbiotic 11/03/15   Dyspnea    DOE   Family history of adverse reaction to anesthesia    sister - PONV   GERD (gastroesophageal reflux disease)    Hypertension    Myocardial infarction (HCC)    Osteopenia    Osteoporosis    Pericarditis    diagnonsed June, 2010, unclear etiology as of yer   Personal history of tobacco use, presenting hazards to health 10/31/2015   Scleroderma (HCC)    ONLY ON SKIN-MILD   UTI (lower urinary tract infection)    Wears dentures    full upper    SURGICAL HISTORY: Past Surgical History:  Procedure Laterality Date   BREAST BIOPSY Right 2012   core - neg   BREAST BIOPSY Left 12/03/2015   INVASIVE LOBULAR CARCINOMA.    CARDIAC CATHETERIZATION     CATARACT EXTRACTION W/ INTRAOCULAR LENS IMPLANT Right    CATARACT EXTRACTION W/PHACO Left 11/17/2018   Procedure: CATARACT EXTRACTION PHACO AND INTRAOCULAR LENS PLACEMENT;  Surgeon: Ferol Rogue, MD;  Location: [facility];  Service: Ophthalmology;  Laterality: Left;  US  00:55 CDE 11.04 Fluid Pack Lot # [id number]   COLONOSCOPY WITH PROPOFOL  N/A  11/08/2015   Procedure: COLONOSCOPY WITH PROPOFOL ;  Surgeon: Rogelia Copping, MD;  Location: [facility];  Service: Endoscopy;  Laterality: N/A;   CORONARY ANGIOPLASTY     ESOPHAGOGASTRODUODENOSCOPY (EGD) WITH PROPOFOL  N/A 11/18/2017   Procedure: ESOPHAGOGASTRODUODENOSCOPY (EGD) WITH PROPOFOL ;  Surgeon: Janalyn Keene NOVAK, MD;  Location: [facility];  Service: Endoscopy;  Laterality: N/A;   ESOPHAGOGASTRODUODENOSCOPY (EGD) WITH PROPOFOL  N/A 02/15/2018   Procedure: ESOPHAGOGASTRODUODENOSCOPY (EGD) WITH PROPOFOL ;  Surgeon: Therisa Bi, MD;  Location: [facility];  Service: Gastroenterology;  Laterality: N/A;   ESOPHAGOGASTRODUODENOSCOPY (EGD) WITH PROPOFOL  N/A 05/10/2018   Procedure: ESOPHAGOGASTRODUODENOSCOPY (EGD) WITH BIOPSIES;  Surgeon: Janalyn Keene NOVAK, MD;  Location: [facility];  Service: Endoscopy;  Laterality: N/A;   EVACUATION BREAST HEMATOMA Left 01/14/2016   Procedure: EVACUATION HEMATOMA BREAST;  Surgeon: Reyes LELON Cota, MD;  Location: [facility];  Service: General;  Laterality: Left;   EYE SURGERY     MASTECTOMY Left 2017   complete mastectomy   MASTECTOMY W/ SENTINEL NODE BIOPSY Left 12/26/2015   Procedure: MASTECTOMY WITH SENTINEL LYMPH NODE BIOPSY;  Surgeon: Reyes LELON Cota, MD;  Location: [facility];  Service: General;  Laterality: Left;   RIGHT/LEFT HEART CATH AND CORONARY ANGIOGRAPHY N/A 07/22/2016   Procedure: Right/Left Heart Cath and Coronary Angiography;  Surgeon: Evalene JINNY Lunger, MD;  Location: [facility];  Service: Cardiovascular;  Laterality: N/A;   TUBAL LIGATION     VESICOVAGINAL FISTULA CLOSURE W/ TAH      SOCIAL HISTORY: Social History   Socioeconomic History   Marital status: Married    Spouse name: Not on file   Number of children: 2   Years of education: Not on file   Highest education level: Not on file  Occupational History   Not on file  Tobacco Use   Smoking status: Former    Current packs/day: 0.00    Average  packs/day: 0.8 packs/day for 40.0 years (30.0 ttl pk-yrs)    Types: Cigarettes    Start date: 11/07/1963    Quit date: 11/07/2003    Years since quitting: 20.5   Smokeless tobacco: Never  Vaping Use   Vaping status: Never Used  Substance and Sexual Activity   Alcohol use: Yes    Alcohol/week: 1.0 - 3.0 standard drink of alcohol    Types: 1 - 3 Glasses of wine per week    Comment: Occasionally.     Drug use: No   Sexual activity: Not on file  Other Topics Concern   Not on file  Social History Narrative   She drinks 1 to 2 caffeinated beverages a day   Social Drivers of Health   Tobacco Use: Medium Risk (05/15/2024)   Patient History    Smoking Tobacco Use: Former    Smokeless Tobacco Use: Never    Passive Exposure: Not on file  Financial Resource Strain: Low Risk  (01/25/2024)   Received from [facility]   Overall Financial Resource Strain (CARDIA)    Difficulty of Paying Living Expenses: Not very hard  Food Insecurity: No Food Insecurity (01/25/2024)   Received from [facility]   Epic    Within the past 12 months, you worried that your food would run out before you got the money to buy more.: Never true    Within the past 12 months, the food you bought just didn't last and you didn't have money to get more.: Never true  Transportation Needs: No Transportation Needs (01/25/2024)   Received from [facility] - Transportation    In the past 12 months, has lack of transportation kept you from medical appointments or from getting medications?: No    Lack of Transportation (Non-Medical): No  Physical Activity: Not on file  Stress: Not on file  Social Connections: Not on file  Intimate Partner Violence: Not on file  Depression (PHQ2-9): Low Risk (05/15/2024)   Depression (PHQ2-9)    PHQ-2 Score: 0  Alcohol Screen: Not on file  Housing: Low Risk  (01/25/2024)   Received from [facility]   Epic    At  any time in the past 12 months, were you homeless or living in a shelter (including now)?: No    In the past 12 months, how many times have you moved where you were living?: 0    In the last 12 months, was there a time when you were not able to pay the mortgage or rent on time?: No  Utilities: Not At Risk (01/25/2024)   Received from [facility]   Epic    In the past 12 months has the electric, gas, oil, or water  company threatened to shut off services in your home?: No  Health Literacy: Not on file    FAMILY HISTORY: Family History  Problem Relation Age of Onset   Heart failure Mother    Epilepsy Mother    COPD Father    Heart disease Father    Anxiety disorder Sister    Arthritis Brother    Heart disease Brother    Vaginal cancer Paternal Grandmother    Heart attack Paternal Grandfather    COPD Brother    Kidney failure Brother    COPD Brother    Arthritis Sister    Uterine cancer Other    Diabetes Other    Colon cancer Neg Hx    Stomach cancer Neg Hx    Breast cancer Neg Hx     ALLERGIES:  is allergic to pimenta and tomato.  MEDICATIONS:  Current Outpatient Medications  Medication Sig Dispense Refill   albuterol  (PROVENTIL ) (2.5 MG/3ML) 0.083% nebulizer solution Take 3 mLs (2.5 mg total) by nebulization every 6 (six) hours as needed for wheezing or shortness of breath. 75 mL 12   amLODipine  (NORVASC ) 2.5 MG tablet Take 1 tablet (2.5 mg total) by mouth daily. 90 tablet 3   Cyanocobalamin  (B-12) 5000 MCG CAPS Take 1,000 mcg by mouth daily.      ezetimibe  (ZETIA ) 10 MG tablet Take 1 tablet (10 mg total) by mouth daily. 90 tablet 3   Ferrous Sulfate (IRON  PO) Take 65 mg of iron  by mouth. Taking 1 tablet daily     fluticasone  (FLONASE ) 50 MCG/ACT nasal spray Place 2 sprays into both nostrils daily. 16 g 0   furosemide  (LASIX ) 20 MG tablet Take 1 tablet (20 mg total) by mouth daily. 90 tablet 3   hydrALAZINE  (APRESOLINE ) 25 MG tablet Take 1 tablet (25 mg  total) by mouth 2 (two) times daily. 180 tablet 2   lisinopril  (ZESTRIL ) 20 MG tablet Take 2 tablets (40 mg total) by mouth daily. 180 tablet 3   metoprolol  succinate (TOPROL -XL) 25 MG 24 hr tablet Take 1 tablet (25 mg total) by mouth daily. 90 tablet 3   nitroGLYCERIN  (NITROSTAT ) 0.4 MG SL tablet Place 1 tablet (0.4 mg total) under the tongue every 5 (five) minutes as needed for chest pain. 30 tablet 3   pantoprazole  (PROTONIX ) 40 MG tablet Take by mouth.     potassium chloride  SA (KLOR-CON  M) 20 MEQ tablet Take 1 tablet (20 mEq total) by mouth daily. 90 tablet 3   simvastatin  (ZOCOR ) 40 MG tablet Take 1 tablet (40 mg total) by mouth daily. 90 tablet 3   tamoxifen  (NOLVADEX ) 20 MG tablet Take 1 tablet (20 mg total) by mouth daily. 90 tablet 1   No current facility-administered medications for this visit.      SABRA  PHYSICAL EXAMINATION: ECOG PERFORMANCE STATUS: 1 - Symptomatic but completely ambulatory  Vitals:   05/15/24 1437 05/15/24 1514  BP: (!) 149/71 (!) 141/67  Pulse: (!) 55   Resp: 16   Temp: 98.2 F (36.8 C)   SpO2: 99%     Filed Weights   05/15/24 1437  Weight: 148 lb 14.4 oz (67.5 kg)     Physical Exam Vitals and nursing note reviewed.  Constitutional:      Comments:      HENT:     Head: Normocephalic and atraumatic.     Mouth/Throat:     Pharynx: Oropharynx is clear.  Eyes:     Extraocular Movements: Extraocular movements intact.     Pupils: Pupils are equal, round, and reactive to light.  Cardiovascular:     Rate and Rhythm: Normal rate and  regular rhythm.  Pulmonary:     Comments: Decreased breath sounds bilaterally.  Abdominal:     Palpations: Abdomen is soft.  Musculoskeletal:        General: Normal range of motion.     Cervical back: Normal range of motion.  Skin:    General: Skin is warm.  Neurological:     General: No focal deficit present.     Mental Status: She is alert and oriented to person, place, and time.  Psychiatric:         Behavior: Behavior normal.        Judgment: Judgment normal.      LABORATORY DATA:  I have reviewed the data as listed Lab Results  Component Value Date   WBC 6.8 05/15/2024   HGB 10.6 (L) 05/15/2024   HCT 31.0 (L) 05/15/2024   MCV 101.3 (H) 05/15/2024   PLT 219 05/15/2024   Recent Labs    11/02/23 1448 01/24/24 1436 05/15/24 1440  NA 129* 133* 132*  K 4.6 4.3 4.7  CL 101 102 98  CO2 21* 21* 26  GLUCOSE 101* 96 108*  BUN 41* 31* 26*  CREATININE 1.78* 1.69* 1.47*  CALCIUM 9.3 8.6* 9.6  GFRNONAA 29* 31* 36*  PROT  --   --  7.9  ALBUMIN  --   --  4.2  AST  --   --  20  ALT  --   --  11  ALKPHOS  --   --  49  BILITOT  --   --  <0.2    RADIOGRAPHIC STUDIES: I have personally reviewed the radiological images as listed and agreed with the findings in the report. No results found.   ASSESSMENT & PLAN:   Symptomatic anemia # Moderate macrocytic anemia secondary ?CKD [s/p bone marrow biopsy-March 2019; dr.C-nonspecific]; MAY 2024- b12- elevated.   # Today hemoglobin: 10.4  on Retacrit / Venofer .   Recommend Gentle iron .   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 Ext Tam]; clinically no evidence of recurrence.  Continue tamoxifen  for now [until summer 2027].  Stable-  aug 2025- mammo-WNL.  Stable.   # Osteopenia  AUG 2024- he BMD  T-score of -2.0.-continue Fosamax  calcium plus vitamin D . Stable.   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  Stable.   #DISPOSITION: #  Retacrit  today; HOLD Venofer  today.  # In 4 weeks- labs H&H- possible Retacrit  # in 8 weeks- labs H&H- possible Retacrit  # in 12  weeks- labs H&H- possible Retacrit  # Follow-up 16 weeks-MD  labs CBC BMP; iron  studies; ferritin-possible Retacrit  or Venofer - - Dr.B   All questions were answered. The  patient knows to call the clinic with any problems, questions or concerns.    Cindy JONELLE Joe, MD 05/15/2024 3:39 PM

## 2024-05-15 NOTE — Progress Notes (Signed)
 Patient states

## 2024-05-15 NOTE — Progress Notes (Signed)
 Fatigue/weakness: YES Dyspena: NO  Light headedness: YES Blood in stool: NO

## 2024-05-31 ENCOUNTER — Encounter (INDEPENDENT_AMBULATORY_CARE_PROVIDER_SITE_OTHER): Admitting: Ophthalmology

## 2024-06-06 ENCOUNTER — Encounter (INDEPENDENT_AMBULATORY_CARE_PROVIDER_SITE_OTHER): Admitting: Ophthalmology

## 2024-06-12 ENCOUNTER — Inpatient Hospital Stay

## 2024-07-10 ENCOUNTER — Inpatient Hospital Stay

## 2024-08-07 ENCOUNTER — Inpatient Hospital Stay

## 2024-09-04 ENCOUNTER — Inpatient Hospital Stay: Admitting: Internal Medicine

## 2024-09-04 ENCOUNTER — Inpatient Hospital Stay
